# Patient Record
Sex: Female | Born: 1950 | Race: White | Hispanic: No | Smoking: Never smoker
Health system: Southern US, Community
[De-identification: ages and names within clinical notes are randomized; demographics above are authoritative.]

## PROBLEM LIST (undated history)

## (undated) DIAGNOSIS — T8859XA Other complications of anesthesia, initial encounter: Secondary | ICD-10-CM

## (undated) DIAGNOSIS — Z9889 Other specified postprocedural states: Secondary | ICD-10-CM

## (undated) DIAGNOSIS — E119 Type 2 diabetes mellitus without complications: Secondary | ICD-10-CM

## (undated) DIAGNOSIS — T4145XA Adverse effect of unspecified anesthetic, initial encounter: Secondary | ICD-10-CM

## (undated) DIAGNOSIS — R112 Nausea with vomiting, unspecified: Secondary | ICD-10-CM

## (undated) DIAGNOSIS — R569 Unspecified convulsions: Secondary | ICD-10-CM

## (undated) DIAGNOSIS — I10 Essential (primary) hypertension: Secondary | ICD-10-CM

## (undated) DIAGNOSIS — M1990 Unspecified osteoarthritis, unspecified site: Secondary | ICD-10-CM

## (undated) DIAGNOSIS — IMO0001 Reserved for inherently not codable concepts without codable children: Secondary | ICD-10-CM

## (undated) DIAGNOSIS — R011 Cardiac murmur, unspecified: Secondary | ICD-10-CM

## (undated) HISTORY — PX: ABDOMINAL HYSTERECTOMY: SHX81

## (undated) HISTORY — PX: COLONOSCOPY: SHX174

## (undated) HISTORY — PX: TONSILLECTOMY: SUR1361

---

## 1993-07-06 HISTORY — PX: CHOLECYSTECTOMY: SHX55

## 1997-12-26 ENCOUNTER — Other Ambulatory Visit: Admission: RE | Admit: 1997-12-26 | Discharge: 1997-12-26 | Payer: Self-pay | Admitting: Orthopedic Surgery

## 1997-12-28 ENCOUNTER — Emergency Department (HOSPITAL_COMMUNITY): Admission: EM | Admit: 1997-12-28 | Discharge: 1997-12-28 | Payer: Self-pay | Admitting: Emergency Medicine

## 1998-12-21 ENCOUNTER — Emergency Department (HOSPITAL_COMMUNITY): Admission: EM | Admit: 1998-12-21 | Discharge: 1998-12-21 | Payer: Self-pay | Admitting: *Deleted

## 1998-12-21 ENCOUNTER — Encounter: Payer: Self-pay | Admitting: Emergency Medicine

## 1999-01-13 ENCOUNTER — Encounter (INDEPENDENT_AMBULATORY_CARE_PROVIDER_SITE_OTHER): Payer: Self-pay | Admitting: Specialist

## 1999-01-13 ENCOUNTER — Inpatient Hospital Stay (HOSPITAL_COMMUNITY): Admission: RE | Admit: 1999-01-13 | Discharge: 1999-01-15 | Payer: Self-pay | Admitting: Obstetrics and Gynecology

## 1999-02-12 ENCOUNTER — Encounter: Admission: RE | Admit: 1999-02-12 | Discharge: 1999-02-12 | Payer: Self-pay | Admitting: Hematology and Oncology

## 1999-02-25 ENCOUNTER — Encounter: Admission: RE | Admit: 1999-02-25 | Discharge: 1999-02-25 | Payer: Self-pay | Admitting: Internal Medicine

## 1999-10-31 ENCOUNTER — Encounter: Admission: RE | Admit: 1999-10-31 | Discharge: 1999-10-31 | Payer: Self-pay | Admitting: Internal Medicine

## 2000-09-02 ENCOUNTER — Encounter: Admission: RE | Admit: 2000-09-02 | Discharge: 2000-09-02 | Payer: Self-pay | Admitting: Internal Medicine

## 2001-05-19 ENCOUNTER — Emergency Department (HOSPITAL_COMMUNITY): Admission: EM | Admit: 2001-05-19 | Discharge: 2001-05-19 | Payer: Self-pay | Admitting: *Deleted

## 2001-05-25 ENCOUNTER — Encounter: Admission: RE | Admit: 2001-05-25 | Discharge: 2001-05-25 | Payer: Self-pay | Admitting: Internal Medicine

## 2001-09-26 ENCOUNTER — Emergency Department (HOSPITAL_COMMUNITY): Admission: EM | Admit: 2001-09-26 | Discharge: 2001-09-26 | Payer: Self-pay | Admitting: Emergency Medicine

## 2001-09-26 ENCOUNTER — Encounter: Payer: Self-pay | Admitting: Emergency Medicine

## 2001-11-16 ENCOUNTER — Encounter: Admission: RE | Admit: 2001-11-16 | Discharge: 2001-11-16 | Payer: Self-pay | Admitting: Internal Medicine

## 2001-12-29 ENCOUNTER — Encounter: Admission: RE | Admit: 2001-12-29 | Discharge: 2001-12-29 | Payer: Self-pay | Admitting: Internal Medicine

## 2002-01-02 ENCOUNTER — Encounter: Admission: RE | Admit: 2002-01-02 | Discharge: 2002-01-02 | Payer: Self-pay | Admitting: Internal Medicine

## 2002-01-09 ENCOUNTER — Ambulatory Visit (HOSPITAL_COMMUNITY): Admission: RE | Admit: 2002-01-09 | Discharge: 2002-01-09 | Payer: Self-pay | Admitting: Otolaryngology

## 2002-01-09 ENCOUNTER — Encounter: Payer: Self-pay | Admitting: Otolaryngology

## 2002-01-09 ENCOUNTER — Emergency Department (HOSPITAL_COMMUNITY): Admission: EM | Admit: 2002-01-09 | Discharge: 2002-01-09 | Payer: Self-pay | Admitting: Emergency Medicine

## 2002-04-04 ENCOUNTER — Ambulatory Visit (HOSPITAL_COMMUNITY): Admission: RE | Admit: 2002-04-04 | Discharge: 2002-04-04 | Payer: Self-pay | Admitting: Gastroenterology

## 2002-04-04 ENCOUNTER — Encounter: Payer: Self-pay | Admitting: Gastroenterology

## 2002-08-17 ENCOUNTER — Emergency Department (HOSPITAL_COMMUNITY): Admission: EM | Admit: 2002-08-17 | Discharge: 2002-08-17 | Payer: Self-pay | Admitting: Emergency Medicine

## 2003-02-21 ENCOUNTER — Encounter: Admission: RE | Admit: 2003-02-21 | Discharge: 2003-02-21 | Payer: Self-pay | Admitting: Internal Medicine

## 2003-05-09 ENCOUNTER — Encounter: Admission: RE | Admit: 2003-05-09 | Discharge: 2003-05-09 | Payer: Self-pay | Admitting: Internal Medicine

## 2003-09-20 ENCOUNTER — Emergency Department (HOSPITAL_COMMUNITY): Admission: AD | Admit: 2003-09-20 | Discharge: 2003-09-20 | Payer: Self-pay | Admitting: Family Medicine

## 2003-10-17 ENCOUNTER — Emergency Department (HOSPITAL_COMMUNITY): Admission: EM | Admit: 2003-10-17 | Discharge: 2003-10-17 | Payer: Self-pay | Admitting: Family Medicine

## 2003-12-20 ENCOUNTER — Encounter: Admission: RE | Admit: 2003-12-20 | Discharge: 2003-12-20 | Payer: Self-pay | Admitting: Internal Medicine

## 2003-12-20 ENCOUNTER — Ambulatory Visit (HOSPITAL_COMMUNITY): Admission: RE | Admit: 2003-12-20 | Discharge: 2003-12-20 | Payer: Self-pay | Admitting: Internal Medicine

## 2004-01-17 ENCOUNTER — Encounter: Admission: RE | Admit: 2004-01-17 | Discharge: 2004-01-17 | Payer: Self-pay | Admitting: Internal Medicine

## 2004-01-23 ENCOUNTER — Ambulatory Visit (HOSPITAL_COMMUNITY): Admission: RE | Admit: 2004-01-23 | Discharge: 2004-01-23 | Payer: Self-pay | Admitting: Gastroenterology

## 2004-01-29 ENCOUNTER — Ambulatory Visit (HOSPITAL_COMMUNITY): Admission: RE | Admit: 2004-01-29 | Discharge: 2004-01-29 | Payer: Self-pay | Admitting: Internal Medicine

## 2004-01-29 ENCOUNTER — Encounter: Admission: RE | Admit: 2004-01-29 | Discharge: 2004-01-29 | Payer: Self-pay | Admitting: Internal Medicine

## 2004-02-19 ENCOUNTER — Encounter: Admission: RE | Admit: 2004-02-19 | Discharge: 2004-02-19 | Payer: Self-pay | Admitting: Internal Medicine

## 2004-10-02 ENCOUNTER — Emergency Department (HOSPITAL_COMMUNITY): Admission: EM | Admit: 2004-10-02 | Discharge: 2004-10-02 | Payer: Self-pay | Admitting: Emergency Medicine

## 2005-01-12 ENCOUNTER — Emergency Department (HOSPITAL_COMMUNITY): Admission: EM | Admit: 2005-01-12 | Discharge: 2005-01-12 | Payer: Self-pay | Admitting: Family Medicine

## 2006-06-05 ENCOUNTER — Emergency Department (HOSPITAL_COMMUNITY): Admission: EM | Admit: 2006-06-05 | Discharge: 2006-06-05 | Payer: Self-pay | Admitting: Family Medicine

## 2006-07-23 ENCOUNTER — Ambulatory Visit: Payer: Self-pay | Admitting: Internal Medicine

## 2006-07-26 ENCOUNTER — Ambulatory Visit: Payer: Self-pay | Admitting: Internal Medicine

## 2006-07-26 LAB — CONVERTED CEMR LAB
ALT: 33 units/L (ref 0–40)
Basophils Absolute: 0.1 10*3/uL (ref 0.0–0.1)
Bilirubin Urine: NEGATIVE
CO2: 27 meq/L (ref 19–32)
Calcium: 9.2 mg/dL (ref 8.4–10.5)
Creatinine, Ser: 0.9 mg/dL (ref 0.4–1.2)
Glucose, Bld: 147 mg/dL — ABNORMAL HIGH (ref 70–99)
HCT: 42.3 % (ref 36.0–46.0)
Hemoglobin, Urine: NEGATIVE
Hemoglobin: 14.8 g/dL (ref 12.0–15.0)
Hgb A1c MFr Bld: 6.7 % — ABNORMAL HIGH (ref 4.6–6.0)
MCV: 86.1 fL (ref 78.0–100.0)
Mucus, UA: NEGATIVE
Neutro Abs: 4.1 10*3/uL (ref 1.4–7.7)
Platelets: 284 10*3/uL (ref 150–400)
RBC: 4.91 M/uL (ref 3.87–5.11)
RDW: 12.4 % (ref 11.5–14.6)
Sodium: 138 meq/L (ref 135–145)
Specific Gravity, Urine: 1.02 (ref 1.000–1.03)
Triglycerides: 154 mg/dL — ABNORMAL HIGH (ref 0–149)
Urine Glucose: NEGATIVE mg/dL
Urobilinogen, UA: 0.2 (ref 0.0–1.0)
VLDL: 31 mg/dL (ref 0–40)
WBC: 8.3 10*3/uL (ref 4.5–10.5)

## 2006-07-28 ENCOUNTER — Ambulatory Visit: Payer: Self-pay | Admitting: Internal Medicine

## 2007-04-17 ENCOUNTER — Emergency Department (HOSPITAL_COMMUNITY): Admission: EM | Admit: 2007-04-17 | Discharge: 2007-04-17 | Payer: Self-pay | Admitting: Family Medicine

## 2008-01-02 ENCOUNTER — Ambulatory Visit: Payer: Self-pay | Admitting: Internal Medicine

## 2008-01-02 DIAGNOSIS — E1165 Type 2 diabetes mellitus with hyperglycemia: Secondary | ICD-10-CM | POA: Insufficient documentation

## 2008-01-02 DIAGNOSIS — E785 Hyperlipidemia, unspecified: Secondary | ICD-10-CM

## 2008-01-02 DIAGNOSIS — I10 Essential (primary) hypertension: Secondary | ICD-10-CM

## 2008-01-02 DIAGNOSIS — Z794 Long term (current) use of insulin: Secondary | ICD-10-CM

## 2008-01-02 LAB — CONVERTED CEMR LAB
Chloride: 102 meq/L (ref 96–112)
GFR calc non Af Amer: 93 mL/min
Glucose, Bld: 203 mg/dL — ABNORMAL HIGH (ref 70–99)
Hgb A1c MFr Bld: 7.1 % — ABNORMAL HIGH (ref 4.6–6.0)

## 2008-01-03 ENCOUNTER — Encounter: Payer: Self-pay | Admitting: Internal Medicine

## 2008-01-05 ENCOUNTER — Encounter: Payer: Self-pay | Admitting: Internal Medicine

## 2008-01-09 ENCOUNTER — Encounter: Payer: Self-pay | Admitting: Internal Medicine

## 2008-03-05 ENCOUNTER — Ambulatory Visit: Payer: Self-pay | Admitting: Internal Medicine

## 2008-03-05 DIAGNOSIS — R5381 Other malaise: Secondary | ICD-10-CM

## 2008-03-05 DIAGNOSIS — R5383 Other fatigue: Secondary | ICD-10-CM

## 2008-03-05 DIAGNOSIS — F411 Generalized anxiety disorder: Secondary | ICD-10-CM | POA: Insufficient documentation

## 2008-03-16 LAB — CONVERTED CEMR LAB
ALT: 34 U/L (ref 0–35)
AST: 25 U/L (ref 0–37)
Albumin: 4 g/dL (ref 3.5–5.2)
Alkaline Phosphatase: 60 U/L (ref 39–117)
BUN: 12 mg/dL (ref 6–23)
Basophils Absolute: 0 K/uL (ref 0.0–0.1)
Basophils Relative: 0.2 % (ref 0.0–3.0)
Bilirubin, Direct: 0.2 mg/dL (ref 0.0–0.3)
CO2: 28 meq/L (ref 19–32)
Calcium: 9.3 mg/dL (ref 8.4–10.5)
Chloride: 109 meq/L (ref 96–112)
Cholesterol: 181 mg/dL (ref 0–200)
Creatinine, Ser: 0.8 mg/dL (ref 0.4–1.2)
Creatinine,U: 195.5 mg/dL
Eosinophils Absolute: 0.4 K/uL (ref 0.0–0.7)
Eosinophils Relative: 3.7 % (ref 0.0–5.0)
GFR calc Af Amer: 96 mL/min
GFR calc non Af Amer: 79 mL/min
Glucose, Bld: 173 mg/dL — ABNORMAL HIGH (ref 70–99)
HCT: 44.2 % (ref 36.0–46.0)
HDL: 37.4 mg/dL — ABNORMAL LOW (ref >39.0)
Hemoglobin: 14.9 g/dL (ref 12.0–15.0)
Hgb A1c MFr Bld: 6.9 % — ABNORMAL HIGH (ref 4.6–6.0)
LDL Cholesterol: 108 mg/dL — ABNORMAL HIGH (ref 0–99)
Lymphocytes Relative: 25.8 % (ref 12.0–46.0)
MCHC: 33.7 g/dL (ref 30.0–36.0)
MCV: 89 fL (ref 78.0–100.0)
Microalb Creat Ratio: 21 mg/g (ref 0.0–30.0)
Microalb, Ur: 4.1 mg/dL — ABNORMAL HIGH (ref 0.0–1.9)
Monocytes Absolute: 0.5 K/uL (ref 0.1–1.0)
Monocytes Relative: 5.6 % (ref 3.0–12.0)
Neutro Abs: 6.1 K/uL (ref 1.4–7.7)
Neutrophils Relative %: 64.7 % (ref 43.0–77.0)
Platelets: 243 K/uL (ref 150–400)
Potassium: 3.9 meq/L (ref 3.5–5.1)
RBC: 4.96 M/uL (ref 3.87–5.11)
RDW: 12.7 % (ref 11.5–14.6)
Sodium: 142 meq/L (ref 135–145)
TSH: 3.32 u[IU]/mL (ref 0.35–5.50)
Total Bilirubin: 0.9 mg/dL (ref 0.3–1.2)
Total CHOL/HDL Ratio: 4.8
Total Protein: 7.4 g/dL (ref 6.0–8.3)
Triglycerides: 179 mg/dL — ABNORMAL HIGH (ref 0–149)
VLDL: 36 mg/dL (ref 0–40)
WBC: 9.5 10*3/microliter (ref 4.5–10.5)

## 2008-03-19 ENCOUNTER — Telehealth: Payer: Self-pay | Admitting: Internal Medicine

## 2008-04-03 ENCOUNTER — Telehealth (INDEPENDENT_AMBULATORY_CARE_PROVIDER_SITE_OTHER): Payer: Self-pay | Admitting: *Deleted

## 2008-05-27 ENCOUNTER — Emergency Department (HOSPITAL_COMMUNITY): Admission: EM | Admit: 2008-05-27 | Discharge: 2008-05-27 | Payer: Self-pay | Admitting: Family Medicine

## 2008-08-09 ENCOUNTER — Ambulatory Visit: Payer: Self-pay | Admitting: Internal Medicine

## 2008-08-13 ENCOUNTER — Telehealth (INDEPENDENT_AMBULATORY_CARE_PROVIDER_SITE_OTHER): Payer: Self-pay | Admitting: *Deleted

## 2008-08-13 LAB — CONVERTED CEMR LAB
CO2: 0 meq/L — CL (ref 19–32)
Calcium: 9.6 mg/dL (ref 8.4–10.5)
Cholesterol: 200 mg/dL (ref 0–200)
GFR calc Af Amer: 112 mL/min
HDL: 46 mg/dL (ref >39.0)
Hgb A1c MFr Bld: 7 % — ABNORMAL HIGH (ref 4.6–6.0)
LDL Cholesterol: 126 mg/dL — ABNORMAL HIGH (ref 0–99)
Sodium: 140 meq/L (ref 135–145)
Total CHOL/HDL Ratio: 4.3
VLDL: 28 mg/dL (ref 0–40)

## 2009-07-06 HISTORY — PX: TOTAL HIP ARTHROPLASTY: SHX124

## 2009-11-21 ENCOUNTER — Encounter: Admission: RE | Admit: 2009-11-21 | Discharge: 2009-11-21 | Payer: Self-pay | Admitting: Orthopedic Surgery

## 2009-11-22 ENCOUNTER — Inpatient Hospital Stay (HOSPITAL_COMMUNITY): Admission: RE | Admit: 2009-11-22 | Discharge: 2009-11-26 | Payer: Self-pay | Admitting: Orthopedic Surgery

## 2009-12-25 ENCOUNTER — Ambulatory Visit: Payer: Self-pay | Admitting: Vascular Surgery

## 2009-12-25 ENCOUNTER — Inpatient Hospital Stay (HOSPITAL_COMMUNITY): Admission: EM | Admit: 2009-12-25 | Discharge: 2010-01-02 | Payer: Self-pay | Admitting: Emergency Medicine

## 2010-07-27 ENCOUNTER — Encounter: Payer: Self-pay | Admitting: *Deleted

## 2010-09-21 LAB — DIFFERENTIAL
Basophils Absolute: 0 10*3/uL (ref 0.0–0.1)
Basophils Relative: 0 % (ref 0–1)
Eosinophils Absolute: 0.2 10*3/uL (ref 0.0–0.7)
Eosinophils Relative: 2 % (ref 0–5)
Lymphocytes Relative: 26 % (ref 12–46)
Lymphs Abs: 2.5 10*3/uL (ref 0.7–4.0)
Monocytes Absolute: 0.7 10*3/uL (ref 0.1–1.0)
Monocytes Relative: 7 % (ref 3–12)
Neutro Abs: 6.1 10*3/uL (ref 1.7–7.7)
Neutrophils Relative %: 64 % (ref 43–77)

## 2010-09-21 LAB — CBC
Hemoglobin: 10.5 g/dL — ABNORMAL LOW (ref 12.0–15.0)
Hemoglobin: 11.7 g/dL — ABNORMAL LOW (ref 12.0–15.0)
MCH: 28 pg (ref 26.0–34.0)
MCH: 28.5 pg (ref 26.0–34.0)
MCHC: 32.5 g/dL (ref 30.0–36.0)
MCHC: 33.3 g/dL (ref 30.0–36.0)
MCV: 85.6 fL (ref 78.0–100.0)
MCV: 86 fL (ref 78.0–100.0)
Platelets: 435 10*3/uL — ABNORMAL HIGH (ref 150–400)
RDW: 15 % (ref 11.5–15.5)

## 2010-09-21 LAB — SEDIMENTATION RATE
Sed Rate: 102 mm/hr — ABNORMAL HIGH (ref 0–22)
Sed Rate: 120 mm/hr — ABNORMAL HIGH (ref 0–22)

## 2010-09-21 LAB — POCT I-STAT, CHEM 8
HCT: 36 % (ref 36.0–46.0)
Sodium: 139 mEq/L (ref 135–145)
TCO2: 28 mmol/L (ref 0–100)

## 2010-09-21 LAB — CULTURE, BLOOD (ROUTINE X 2)

## 2010-09-21 LAB — URINALYSIS, ROUTINE W REFLEX MICROSCOPIC: Hgb urine dipstick: NEGATIVE

## 2010-09-21 LAB — C-REACTIVE PROTEIN: CRP: 2.3 mg/dL — ABNORMAL HIGH (ref <0.6)

## 2010-09-22 LAB — CBC
HCT: 35 % — ABNORMAL LOW (ref 36.0–46.0)
HCT: 45.3 % (ref 36.0–46.0)
Hemoglobin: 12.2 g/dL (ref 12.0–15.0)
Hemoglobin: 15.2 g/dL — ABNORMAL HIGH (ref 12.0–15.0)
Hemoglobin: 9.1 g/dL — ABNORMAL LOW (ref 12.0–15.0)
MCHC: 34.3 g/dL (ref 30.0–36.0)
MCHC: 34.4 g/dL (ref 30.0–36.0)
MCHC: 34.8 g/dL (ref 30.0–36.0)
MCV: 89.6 fL (ref 78.0–100.0)
MCV: 90 fL (ref 78.0–100.0)
Platelets: 224 10*3/uL (ref 150–400)
RBC: 2.94 MIL/uL — ABNORMAL LOW (ref 3.87–5.11)
RBC: 3.5 MIL/uL — ABNORMAL LOW (ref 3.87–5.11)
RBC: 3.9 MIL/uL (ref 3.87–5.11)
RBC: 5.04 MIL/uL (ref 3.87–5.11)
RDW: 13.4 % (ref 11.5–15.5)
RDW: 13.7 % (ref 11.5–15.5)
WBC: 14.1 10*3/uL — ABNORMAL HIGH (ref 4.0–10.5)

## 2010-09-22 LAB — BASIC METABOLIC PANEL
CO2: 24 mEq/L (ref 19–32)
CO2: 27 mEq/L (ref 19–32)
CO2: 30 mEq/L (ref 19–32)
Calcium: 8.7 mg/dL (ref 8.4–10.5)
Chloride: 102 mEq/L (ref 96–112)
Chloride: 99 mEq/L (ref 96–112)
Creatinine, Ser: 0.66 mg/dL (ref 0.4–1.2)
Creatinine, Ser: 0.71 mg/dL (ref 0.4–1.2)
GFR calc Af Amer: >60 mL/min (ref >60)
GFR calc Af Amer: >60 mL/min (ref >60)
GFR calc Af Amer: >60 mL/min (ref >60)
GFR calc non Af Amer: >60 mL/min (ref >60)
Glucose, Bld: 200 mg/dL — ABNORMAL HIGH (ref 70–99)
Potassium: 4.1 mEq/L (ref 3.5–5.1)
Sodium: 135 mEq/L (ref 135–145)
Sodium: 136 mEq/L (ref 135–145)

## 2010-09-22 LAB — URINALYSIS, ROUTINE W REFLEX MICROSCOPIC
Glucose, UA: NEGATIVE mg/dL
Hgb urine dipstick: NEGATIVE
pH: 5 (ref 5.0–8.0)

## 2010-09-22 LAB — PROTIME-INR
INR: 1.19 (ref 0.00–1.49)
Prothrombin Time: 12.4 seconds (ref 11.6–15.2)
Prothrombin Time: 13.4 seconds (ref 11.6–15.2)
Prothrombin Time: 15 seconds (ref 11.6–15.2)

## 2010-09-22 LAB — COMPREHENSIVE METABOLIC PANEL
AST: 29 U/L (ref 0–37)
Albumin: 4.5 g/dL (ref 3.5–5.2)
BUN: 16 mg/dL (ref 6–23)
Chloride: 103 mEq/L (ref 96–112)
GFR calc non Af Amer: >60 mL/min (ref >60)
Glucose, Bld: 145 mg/dL — ABNORMAL HIGH (ref 70–99)
Potassium: 4.3 mEq/L (ref 3.5–5.1)
Sodium: 137 mEq/L (ref 135–145)
Total Protein: 7.4 g/dL (ref 6.0–8.3)

## 2010-09-22 LAB — URINALYSIS, MICROSCOPIC ONLY
Glucose, UA: 100 mg/dL — AB
Specific Gravity, Urine: 1.008 (ref 1.005–1.030)
Urobilinogen, UA: 0.2 mg/dL (ref 0.0–1.0)
pH: 8 (ref 5.0–8.0)

## 2010-09-22 LAB — HEMOGLOBIN A1C: Mean Plasma Glucose: 151 mg/dL — ABNORMAL HIGH (ref <117)

## 2010-09-22 LAB — GLUCOSE, CAPILLARY
Glucose-Capillary: 141 mg/dL — ABNORMAL HIGH (ref 70–99)
Glucose-Capillary: 144 mg/dL — ABNORMAL HIGH (ref 70–99)
Glucose-Capillary: 159 mg/dL — ABNORMAL HIGH (ref 70–99)
Glucose-Capillary: 195 mg/dL — ABNORMAL HIGH (ref 70–99)
Glucose-Capillary: 202 mg/dL — ABNORMAL HIGH (ref 70–99)

## 2010-11-21 NOTE — Assessment & Plan Note (Signed)
[facility]                           PRIMARY CARE OFFICE NOTE   Kelly Richardson, Kelly Richardson                     MRN:          [id number]  DATE:07/23/2006                            DOB:          Apr 04, 1951    CHIEF COMPLAINT:  New patient to practice.   HISTORY OF PRESENT ILLNESS:  The patient is a 60 year old white female  here to establish primary care.  She was formerly followed by  [facility].  She has not had followup with a primary  care physician in several years.  She has seen Dr. Jarold Motto of [facility] back in 2003 for feeling of hoarseness and dysphagia.  She underwent  esophagram which was positive for hiatal hernia, no esophagitis, no  abnormal neuromuscular dysfunction.   According to Dr. Norval Gable notes, she does have a psychiatric history  in the past that may have contributed to her symptomatology.  She still  continues to have intermittent discomfort in her throat.  She attributes  her symptoms after undergoing intubation for hysterectomy.  She,  however, also has daily GERD symptoms, mainly at night.  She takes Tums  at least four times per week.   Review of our records also show that she has had abnormal blood sugars  in the past.  She denies any family history of type 2 diabetes, but she  has not had any recent screening tests for diabetes.   In terms of her social history, she has been disabled since the late  1980s secondary to a severe adverse reaction to a medication, Seldane-D.  She notes seizure activity as well as vomiting.  This all occurred while  she lived in [place], [place].   PAST MEDICAL HISTORY SUMMARY:  1. Dysphagia, unclear etiology.  2. Hiatal hernia.  3. Gastroesophageal reflux disease.  4. Status post cholecystectomy in 1995.  5. Status post hysterectomy in 2000.  6. Remote tonsillectomy.  7. Questionable history of arrhythmia.  8. Question history of seizures.   CURRENT  MEDICATIONS:  None.   ALLERGIES TO MEDICATIONS:  1. PENICILLIN.  2. SULFA.  3. SELDANE-D.   SOCIAL HISTORY:  She is divorced, currently living with a roommate.   FAMILY HISTORY:  Mother is alive at age 3, has COPD stage IV.  Father's  medical history is unknown.  She denies any family history of cancer or  type 2 diabetes.   HABITS:  She does not drink or smoke.  She states she had a remote  colonoscopy in the late 1980s and had a bad experience and refuses any  further colon screening.   REVIEW OF SYSTEMS:  No fevers or chills.  HEENT symptoms as noted above.  The patient denies any chest pain.  Some wheezing and some nocturnal  cough.  GI symptoms also noted above.  All other symptoms negative.   PHYSICAL EXAMINATION:  VITAL SIGNS:  Weight is 220 pounds, temperature  is 98.3, pulse is 92, BP is 146/89 in the left arm in a seated position.  GENERAL:  The patient is a pleasant but morbidly-obese 60 year old white  female in no  apparent distress.  She does appear somewhat anxious.  HEENT:  Normocephalic, atraumatic.  Pupils are equal and reactive to  light bilaterally.  Extraocular motility was intact.  The patient was  anicteric, conjunctivae were within normal limits.  External auditory  canals and tympanic membranes are clear bilaterally.  Hearing was  grossly normal.  Oropharyngeal exam was unremarkable.  NECK:  Was somewhat thick but no adenopathy, carotid bruit or  thyromegaly.  CHEST:  Normal inspiratory effort.  No rhonchi, rales or wheezing.  CARDIOVASCULAR:  Regular rate and rhythm.  No significant murmurs, rubs  or gallops appreciated.  ABDOMEN:  Protuberant but nontender.  No organomegaly appreciated.  MUSCULOSKELETAL:  No clubbing, cyanosis, or edema.  The patient had  intact pedis dorsalis pulses.   IMPRESSION/RECOMMENDATIONS:  1. Dysphagia, likely secondary to gastroesophageal reflux disease.  2. Morbid obesity.  3. Elevated blood pressure without diagnosis of  hypertension.  4. Health maintenance.   RECOMMENDATIONS:  The patient will be started on omeprazole 20 mg b.i.d.  She does have longstanding GERD symptoms and may benefit from EGD to  rule out Barrett's esophagus.   She states her blood pressure at home normally is in the 120s to 130s.  She was instructed to maintain a log of outpatient blood pressure  readings, at least 10 before our followup visit in 4-6 weeks.   I provided the contact information for screening mammogram.  She  adamantly refused any colon screening.  We discussed the risks and  benefits.  Followup time is 4-6 weeks.     Barbette Hair. Artist Pais, DO  Electronically Signed   RDY/MedQ  DD: 07/23/2006  DT: 07/23/2006  Job #: [id number]

## 2011-04-07 LAB — POCT URINALYSIS DIP (DEVICE)
Bilirubin Urine: NEGATIVE
Hgb urine dipstick: NEGATIVE
Nitrite: NEGATIVE
pH: 7 (ref 5.0–8.0)

## 2011-09-15 DIAGNOSIS — Z124 Encounter for screening for malignant neoplasm of cervix: Secondary | ICD-10-CM | POA: Diagnosis not present

## 2011-09-15 DIAGNOSIS — R1084 Generalized abdominal pain: Secondary | ICD-10-CM | POA: Diagnosis not present

## 2011-09-15 DIAGNOSIS — Z01419 Encounter for gynecological examination (general) (routine) without abnormal findings: Secondary | ICD-10-CM | POA: Diagnosis not present

## 2011-09-15 DIAGNOSIS — Z9189 Other specified personal risk factors, not elsewhere classified: Secondary | ICD-10-CM | POA: Diagnosis not present

## 2011-09-15 DIAGNOSIS — R32 Unspecified urinary incontinence: Secondary | ICD-10-CM | POA: Diagnosis not present

## 2011-09-15 DIAGNOSIS — R1031 Right lower quadrant pain: Secondary | ICD-10-CM | POA: Diagnosis not present

## 2012-02-05 IMAGING — CT CT CHEST W/O CM
2 of 3 series · 15 of 36 positions shown, 18 images · non-contrast
Comparison: Chest x-ray 11/20/2009

CLINICAL DATA: Congenital dysplasia of the right hip.  Chronic
cough.  Diabetes. Question of right lower lobe nodule.

CT CHEST WITHOUT CONTRAST
TECHNIQUE: Multidetector CT imaging of the chest was performed
following the standard protocol without IV contrast.

[Series 2: routine chest · axial · 0.70mm/px · z∈[-248,-18]mm · 12 of 54 slices shown, 15 images]
[im 4/54  mediastinal]
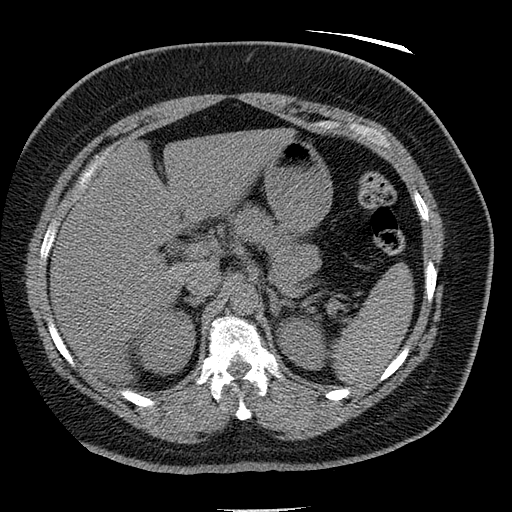
[im 4/54  lung]
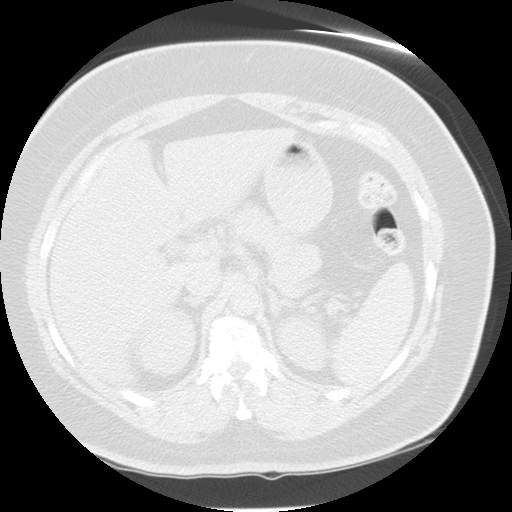
[im 8/54  lung]
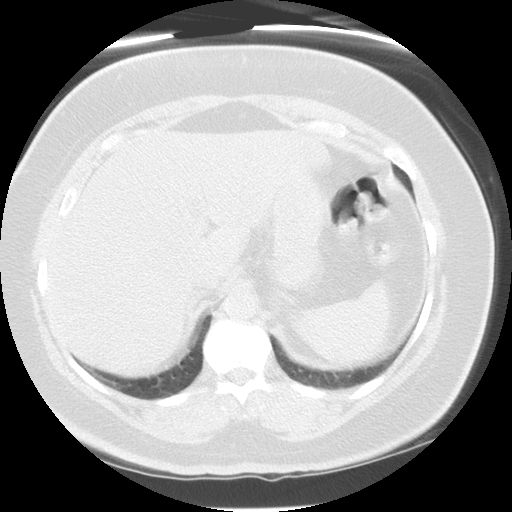
[im 12/54  lung]
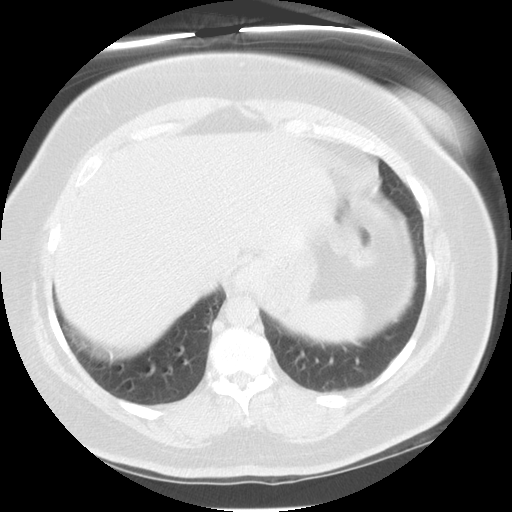
[im 16/54  lung]
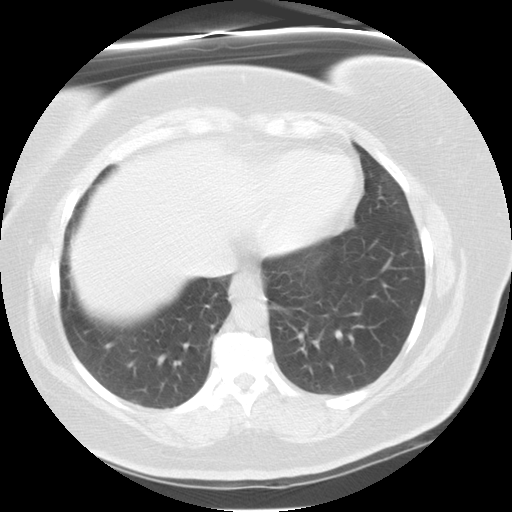
[im 20/54  mediastinal]
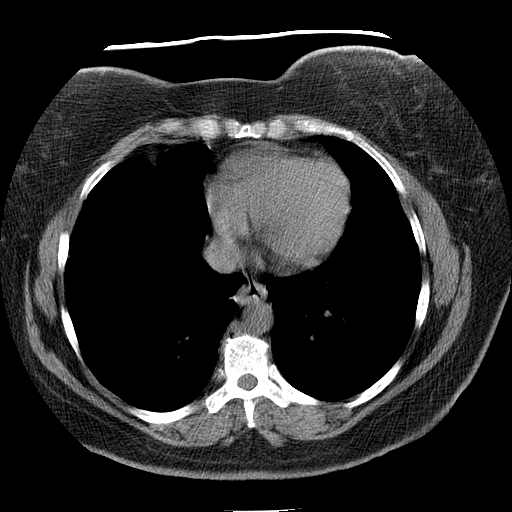
[im 20/54  lung]
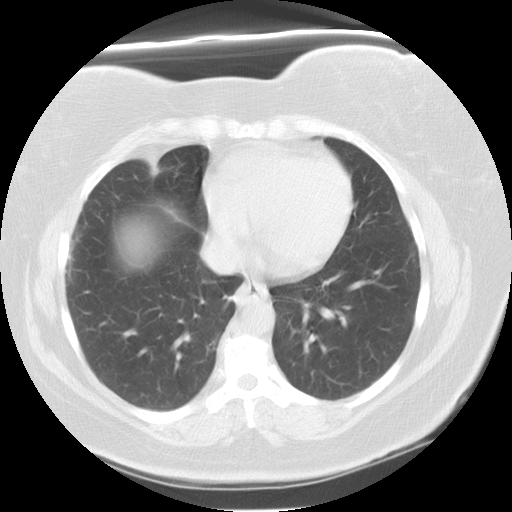
[im 24/54  lung]
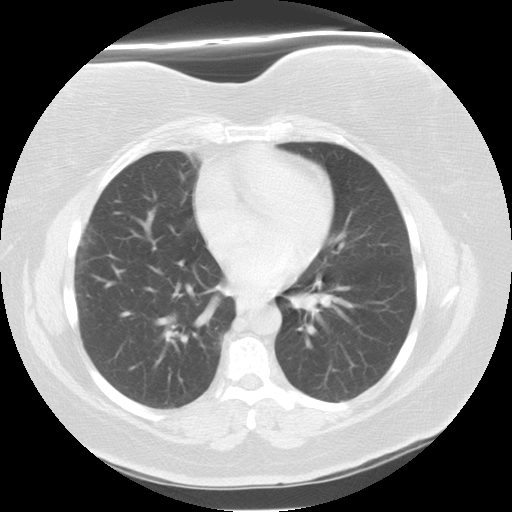
[im 30/54  lung]
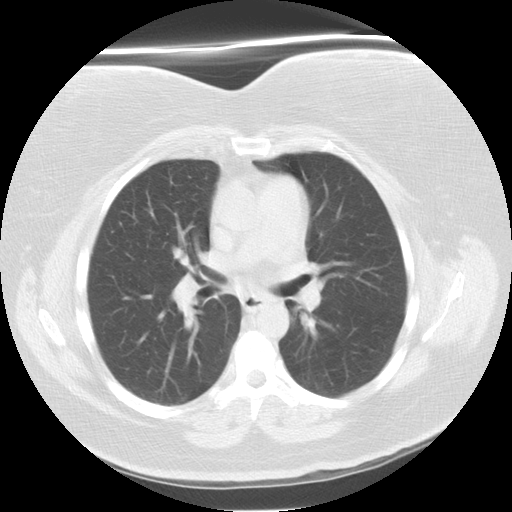
[im 34/54  lung]
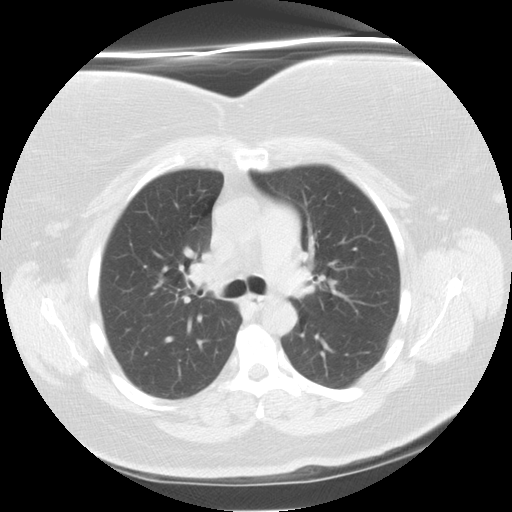
[im 38/54  mediastinal]
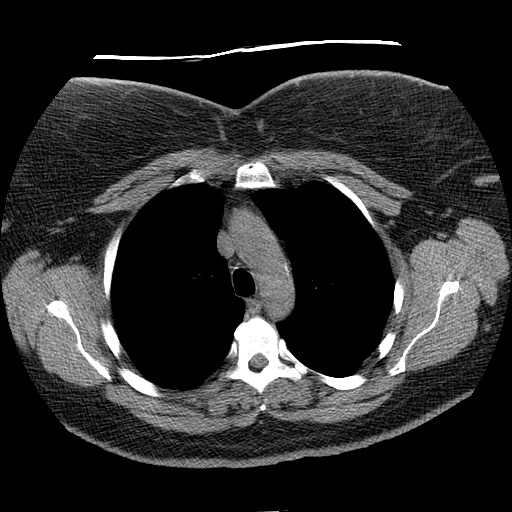
[im 38/54  lung]
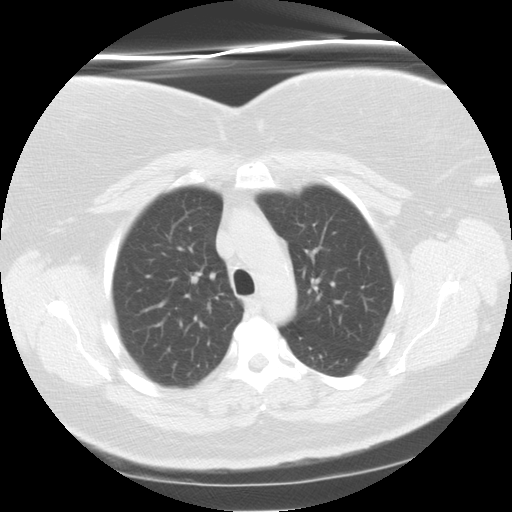
[im 42/54  lung]
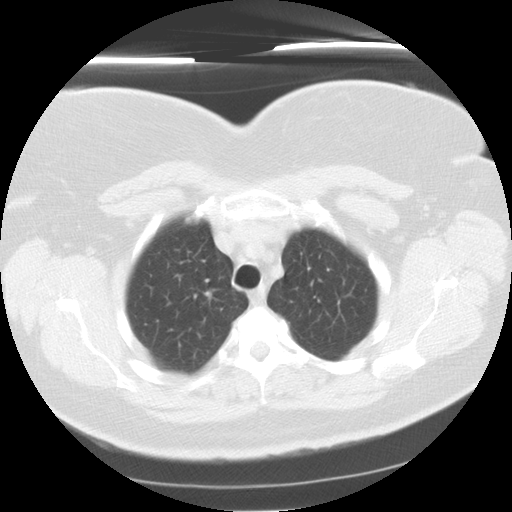
[im 46/54  lung]
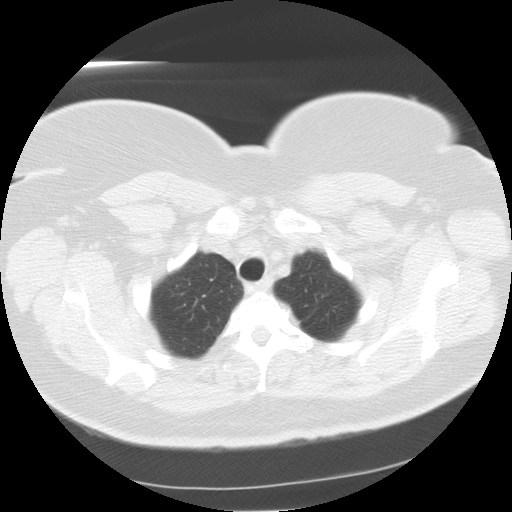
[im 50/54  lung]
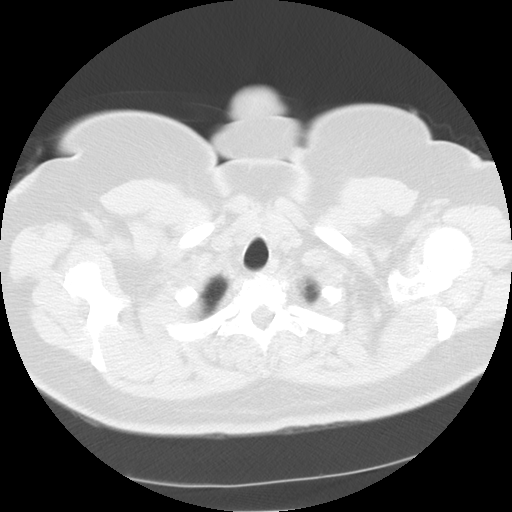

[Series 400: coronal · coronal · 0.70mm/px · 3 of 127 slices shown]
[im 26/127  lung]
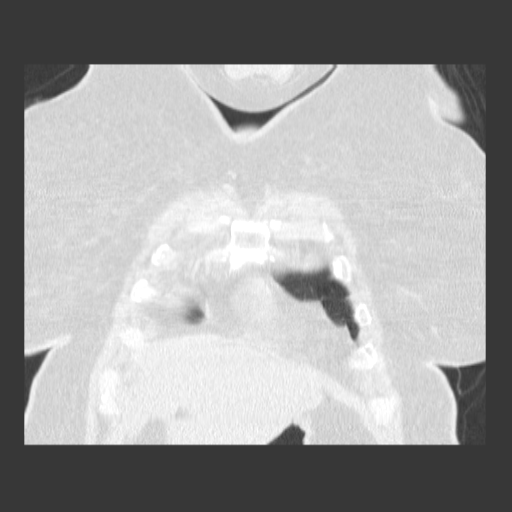
[im 51/127  lung]
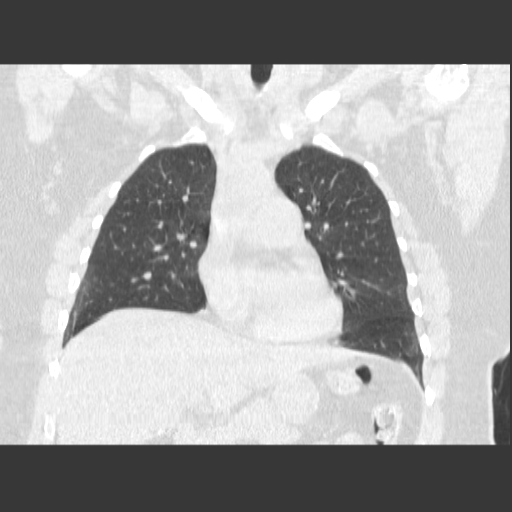
[im 76/127  lung]
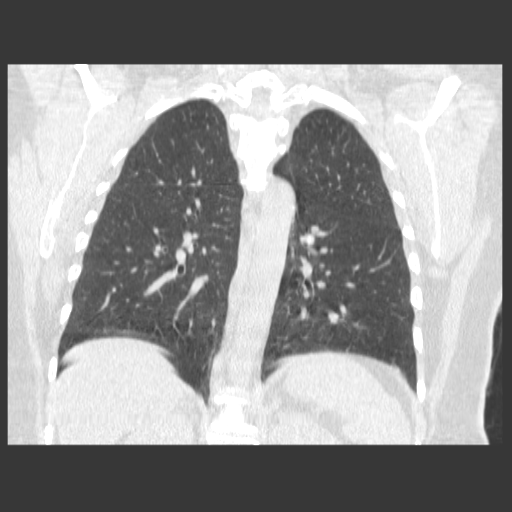

[15 of 36 positions shown; findings below may reference images not displayed]

FINDINGS: At the right lung base, there is a small amount of
pleural scarring, likely accounting for the abnormality seen on
chest x-ray.  No definite nodules, pleural effusion, or infiltrate
identified by CT.  The visualized portion of the thyroid has a
normal appearance.  No mediastinal, hilar, or axillary adenopathy.
The distal esophagus appears thick-walled, consistent with a hiatal
hernia.

Images of the upper abdomen show surgical clips in the region of
the gallbladder fossa.  There is probable small splenic artery
aneurysm, incompletely imaged.
IMPRESSION: 1.  Small amount of pleural scarring at the right lung base
accounting for the plain film abnormality.  No evidence for
pulmonary nodule.
2.  Status post cholecystectomy.
3.  Hiatal hernia.
4.  Small splenic artery aneurysm.

## 2012-02-06 IMAGING — CR DG HIP 1V PORT*L*
1 series · 1 of 1 positions shown · non-contrast
Comparison: None.

CLINICAL DATA: Status post right hip replacement for a congenital
dysplasia.

PORTABLE LEFT HIP - 1 VIEW

[AP]
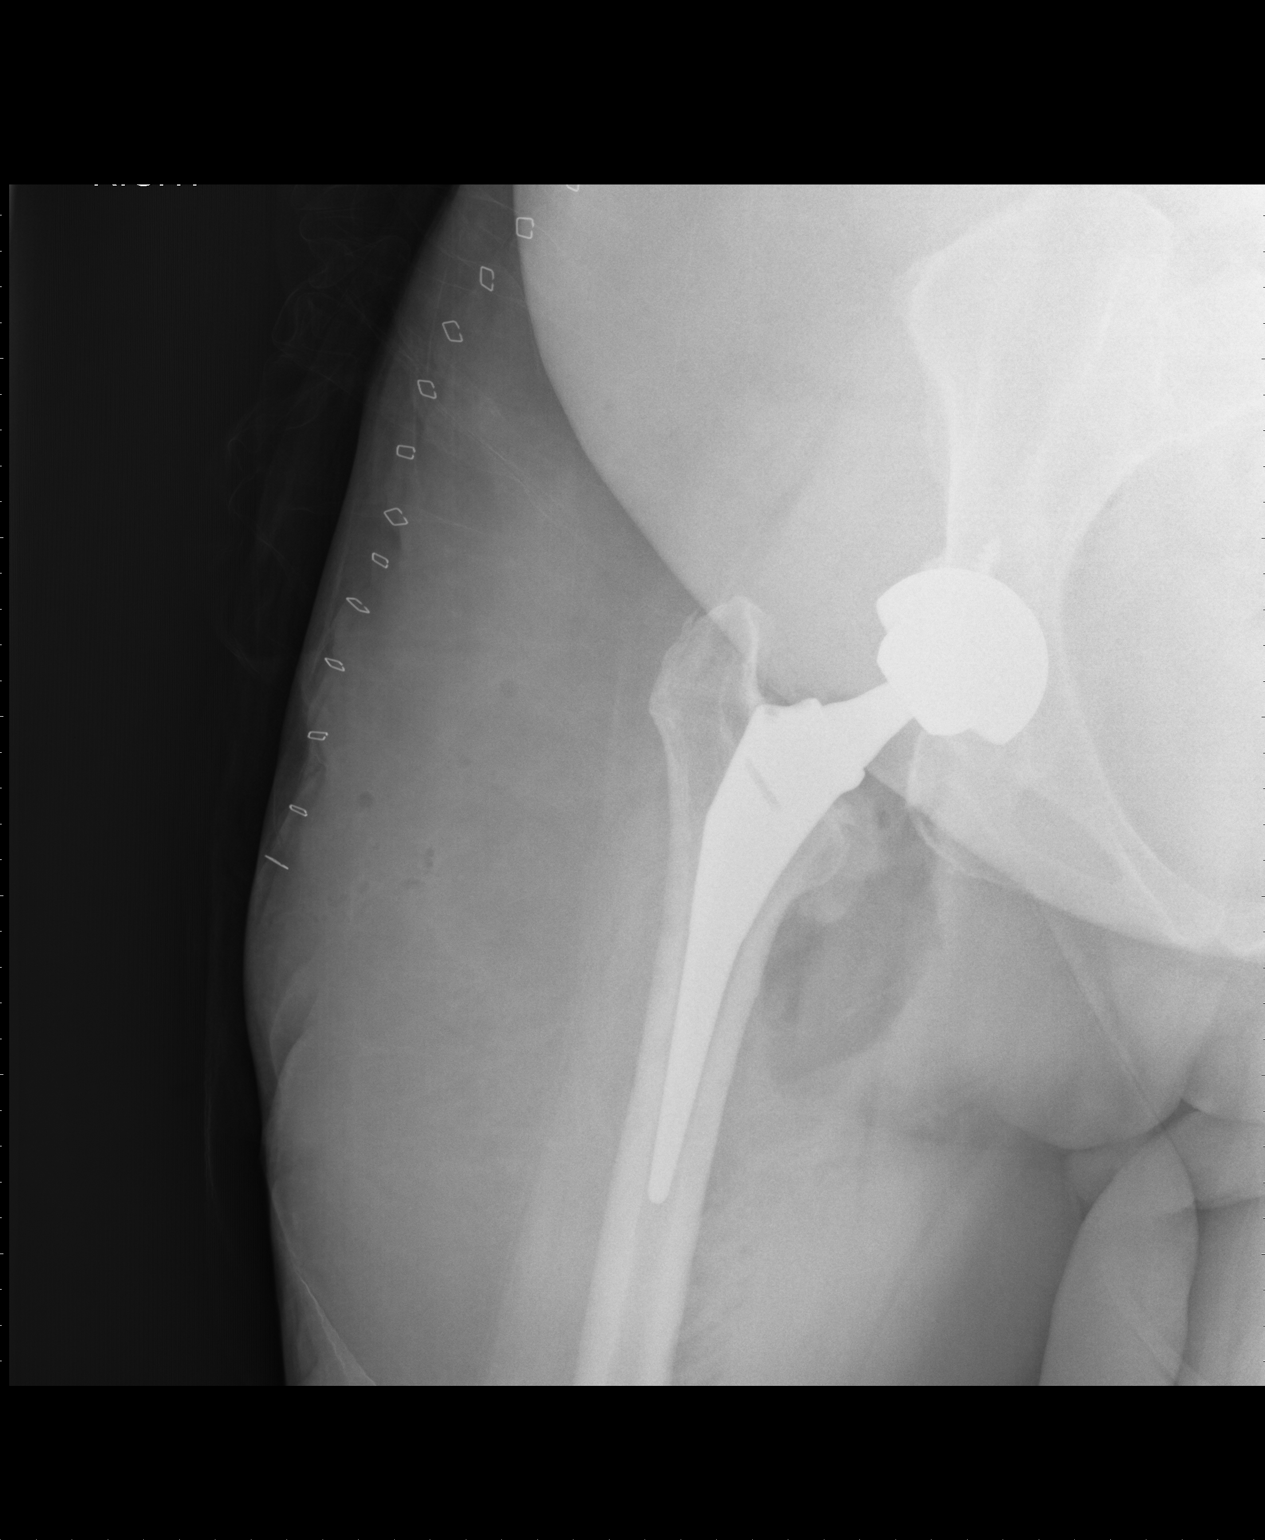

[1 of 1 positions shown; findings below may reference images not displayed]

FINDINGS: A single frontal portable view of the right hip was
requested and performed.  This demonstrates a right total hip
prosthesis in satisfactory position and alignment.  No fracture or
dislocation seen.
IMPRESSION: Satisfactory postoperative appearance of a right total hip
prosthesis in the frontal projection.

## 2012-04-20 ENCOUNTER — Emergency Department (HOSPITAL_COMMUNITY): Payer: Medicare Other

## 2012-04-20 ENCOUNTER — Emergency Department (HOSPITAL_COMMUNITY)
Admission: EM | Admit: 2012-04-20 | Discharge: 2012-04-21 | Disposition: A | Discharge status: Home / Self Care | Payer: Medicare Other | Attending: Emergency Medicine | Admitting: Emergency Medicine

## 2012-04-20 DIAGNOSIS — R7301 Impaired fasting glucose: Secondary | ICD-10-CM | POA: Diagnosis not present

## 2012-04-20 DIAGNOSIS — IMO0001 Reserved for inherently not codable concepts without codable children: Secondary | ICD-10-CM | POA: Insufficient documentation

## 2012-04-20 DIAGNOSIS — R5381 Other malaise: Secondary | ICD-10-CM | POA: Diagnosis not present

## 2012-04-20 DIAGNOSIS — R05 Cough: Secondary | ICD-10-CM | POA: Diagnosis not present

## 2012-04-20 DIAGNOSIS — R197 Diarrhea, unspecified: Secondary | ICD-10-CM | POA: Diagnosis not present

## 2012-04-20 DIAGNOSIS — J3489 Other specified disorders of nose and nasal sinuses: Secondary | ICD-10-CM | POA: Insufficient documentation

## 2012-04-20 DIAGNOSIS — R509 Fever, unspecified: Secondary | ICD-10-CM | POA: Insufficient documentation

## 2012-04-20 DIAGNOSIS — R070 Pain in throat: Secondary | ICD-10-CM | POA: Diagnosis not present

## 2012-04-20 DIAGNOSIS — R7309 Other abnormal glucose: Secondary | ICD-10-CM | POA: Diagnosis not present

## 2012-04-20 DIAGNOSIS — R059 Cough, unspecified: Secondary | ICD-10-CM | POA: Diagnosis not present

## 2012-04-20 DIAGNOSIS — R112 Nausea with vomiting, unspecified: Secondary | ICD-10-CM | POA: Diagnosis not present

## 2012-04-20 DIAGNOSIS — R0602 Shortness of breath: Secondary | ICD-10-CM | POA: Diagnosis not present

## 2012-04-20 DIAGNOSIS — B338 Other specified viral diseases: Secondary | ICD-10-CM | POA: Diagnosis not present

## 2012-04-20 DIAGNOSIS — R110 Nausea: Secondary | ICD-10-CM | POA: Insufficient documentation

## 2012-04-20 DIAGNOSIS — R739 Hyperglycemia, unspecified: Secondary | ICD-10-CM

## 2012-04-20 DIAGNOSIS — B349 Viral infection, unspecified: Secondary | ICD-10-CM

## 2012-04-20 LAB — POCT I-STAT, CHEM 8
Chloride: 103 mEq/L (ref 96–112)
HCT: 46 % (ref 36.0–46.0)
Potassium: 4 mEq/L (ref 3.5–5.1)
Sodium: 137 mEq/L (ref 135–145)

## 2012-04-20 LAB — URINALYSIS, ROUTINE W REFLEX MICROSCOPIC
Glucose, UA: >1000 mg/dL — AB
Hgb urine dipstick: NEGATIVE
Protein, ur: NEGATIVE mg/dL
Specific Gravity, Urine: 1.029 (ref 1.005–1.030)

## 2012-04-20 LAB — URINE MICROSCOPIC-ADD ON

## 2012-04-20 MED ORDER — SODIUM CHLORIDE 0.9 % IV BOLUS (SEPSIS): 1000.0000 mL | Freq: Once | INTRAVENOUS | Status: DC

## 2012-04-20 NOTE — ED Notes (Signed)
Pt has not had flu shot this season.

## 2012-04-20 NOTE — ED Notes (Signed)
Per [facility] pt has had n/v diarrhea cough and sore throat for 8 days. Appetite poor. CBG 324

## 2012-04-20 NOTE — ED Notes (Signed)
ZOX:[id number]<AV> Expected date:<BR> Expected time:<BR> Means of arrival:<BR> Comments:<BR> CBG &gt; 300-weakness

## 2012-04-20 NOTE — ED Provider Notes (Signed)
History     CSN: [id number]  Arrival date & time 04/20/12  2015   First MD Initiated Contact with Patient 04/20/12 2235      Chief Complaint  Patient presents with  . Fever    nausea vomiting diarrhea cough sore throat    (Consider location/radiation/quality/duration/timing/severity/associated sxs/prior treatment) HPI Comments: Patient comes in today with a chief complaint of subjective fever, productive cough, sore throat, decreased appetite, body aches, and fatigue for the past 8 days.  She has also had some nausea and post tussive vomiting.  She is unsure what her temperature has been because she does not have a thermometer.  She has not taken anything for her symptoms.  She did not yet have an influenza vaccine. She does not smoke.    No known sick contacts.  Patient is a 61 y.o. female presenting with URI. The history is provided by the patient.  URI The primary symptoms include fever, fatigue, sore throat, cough, nausea and myalgias. Primary symptoms do not include ear pain, abdominal pain or rash.  The sore throat is not accompanied by trouble swallowing or drooling.  Symptoms associated with the illness include chills, plugged ear sensation, congestion and rhinorrhea. The illness is not associated with sinus pressure.    No past medical history on file.  No past surgical history on file.  No family history on file.  History  Substance Use Topics  . Smoking status: Not on file  . Smokeless tobacco: Not on file  . Alcohol Use: Not on file    OB History    No data available      Review of Systems  Constitutional: Positive for fever, chills, appetite change and fatigue.  HENT: Positive for congestion, sore throat and rhinorrhea. Negative for ear pain, drooling, trouble swallowing, neck pain, neck stiffness, voice change and sinus pressure.   Respiratory: Positive for cough. Negative for shortness of breath.   Cardiovascular: Negative for chest pain.    Gastrointestinal: Positive for nausea and diarrhea. Negative for abdominal pain, constipation and blood in stool.       Post tussive vomiting  Genitourinary: Negative for dysuria, urgency and frequency.  Musculoskeletal: Positive for myalgias.  Skin: Negative for rash.  Neurological: Negative for dizziness, syncope and light-headedness.    Allergies  Iohexol; Metformin; Penicillins; and Sulfonamide derivatives  Home Medications   Current Outpatient Rx  Name Route Sig Dispense Refill  . DEXTROMETHORPHAN-GUAIFENESIN 10-100 MG/5ML PO LIQD Oral Take 5 mLs by mouth every 4 (four) hours as needed. Cough    . PHENOL 1.4 % MT LIQD Mouth/Throat Use as directed 1 spray in the mouth or throat as needed. Throat irritation      BP 151/83  Pulse 106  Temp 98.7 F (37.1 C) (Oral)  Resp 18  Wt 230 lb (104.327 kg)  SpO2 97%  Physical Exam  Nursing note and vitals reviewed. Constitutional: She appears well-developed and well-nourished. No distress.  HENT:  Head: Normocephalic and atraumatic.  Nose: Nose normal.  Mouth/Throat: Uvula is midline and mucous membranes are normal. Posterior oropharyngeal erythema present. No oropharyngeal exudate or posterior oropharyngeal edema.  Eyes: EOM are normal. Pupils are equal, round, and reactive to light.  Neck: Normal range of motion. Neck supple.  Cardiovascular: Normal rate, regular rhythm and normal heart sounds.   Pulmonary/Chest: Effort normal and breath sounds normal. No respiratory distress. She has no wheezes. She has no rales.  Abdominal: Soft. Bowel sounds are normal. She exhibits no distension and no  mass. There is no tenderness. There is no rebound and no guarding.  Musculoskeletal: Normal range of motion.  Lymphadenopathy:    She has no cervical adenopathy.  Neurological: She is alert.  Skin: Skin is warm and dry. No rash noted. She is not diaphoretic.  Psychiatric: She has a normal mood and affect.    ED Course  Procedures  (including critical care time)  Labs Reviewed  GLUCOSE, CAPILLARY - Abnormal; Notable for the following:    Glucose-Capillary 320 (*)     All other components within normal limits  URINALYSIS, ROUTINE W REFLEX MICROSCOPIC - Abnormal; Notable for the following:    APPearance CLOUDY (*)     Glucose, UA >1000 (*)     All other components within normal limits  URINE MICROSCOPIC-ADD ON - Abnormal; Notable for the following:    Squamous Epithelial / LPF FEW (*)     All other components within normal limits  RAPID STREP SCREEN   Dg Chest 2 View  04/20/2012  *RADIOLOGY REPORT*  Clinical Data: Short of breath with productive cough for 8 days. Nonsmoker.  CHEST - 2 VIEW  Comparison: 11/20/2009  Findings: Cholecystectomy clips.   Normal heart size and mediastinal contours. No pleural effusion or pneumothorax.  Mildly low lung volumes.  Mild right infrahilar volume loss which is similar. Clear lungs.  IMPRESSION: No acute cardiopulmonary disease.   Original Report Authenticated By: Consuello Bossier, M.D.      No diagnosis found.    MDM  Patient presenting with subjective fever, ST, cough, nausea, fatigue, congestion, decreased appetite, and body aches. UA negative for infection.  Glucose >1000 in urine.  Patient informed of this and educated on the importance of following up with a PCP for DM management.  No known history of DM.  CXR negative.   Patient non toxic appearing.  Suspect viral illness.  Patient discharged home.  Instructed to follow up with PCP.  Return precautions discussed.        Pascal Lux Sorrento, PA-C 04/21/12 [phone]

## 2012-04-25 NOTE — ED Provider Notes (Signed)
Medical screening examination/treatment/procedure(s) were performed by non-physician practitioner and as supervising physician I was immediately available for consultation/collaboration.  Katreena Schupp, MD 04/25/12 2156 

## 2012-09-08 ENCOUNTER — Emergency Department (HOSPITAL_COMMUNITY)
Admission: EM | Admit: 2012-09-08 | Discharge: 2012-09-08 | Disposition: A | Discharge status: Home / Self Care | Payer: Medicare Other | Attending: Emergency Medicine | Admitting: Emergency Medicine

## 2012-09-08 ENCOUNTER — Encounter (HOSPITAL_COMMUNITY): Payer: Self-pay | Admitting: Emergency Medicine

## 2012-09-08 DIAGNOSIS — Z7982 Long term (current) use of aspirin: Secondary | ICD-10-CM | POA: Diagnosis not present

## 2012-09-08 DIAGNOSIS — E1169 Type 2 diabetes mellitus with other specified complication: Secondary | ICD-10-CM | POA: Insufficient documentation

## 2012-09-08 DIAGNOSIS — B3731 Acute candidiasis of vulva and vagina: Secondary | ICD-10-CM | POA: Insufficient documentation

## 2012-09-08 DIAGNOSIS — B373 Candidiasis of vulva and vagina: Secondary | ICD-10-CM | POA: Diagnosis not present

## 2012-09-08 HISTORY — DX: Type 2 diabetes mellitus without complications: E11.9

## 2012-09-08 LAB — CBC WITH DIFFERENTIAL/PLATELET
HCT: 44 % (ref 36.0–46.0)
Hemoglobin: 14.5 g/dL (ref 12.0–15.0)
Lymphocytes Relative: 32 % (ref 12–46)
Monocytes Absolute: 0.6 10*3/uL (ref 0.1–1.0)
Monocytes Relative: 7 % (ref 3–12)
Neutro Abs: 4.6 10*3/uL (ref 1.7–7.7)
RBC: 5.25 MIL/uL — ABNORMAL HIGH (ref 3.87–5.11)
WBC: 7.9 10*3/uL (ref 4.0–10.5)

## 2012-09-08 LAB — BASIC METABOLIC PANEL
BUN: 12 mg/dL (ref 6–23)
CO2: 26 mEq/L (ref 19–32)
Chloride: 97 mEq/L (ref 96–112)
Creatinine, Ser: 0.55 mg/dL (ref 0.50–1.10)
Potassium: 4 mEq/L (ref 3.5–5.1)

## 2012-09-08 LAB — URINALYSIS, ROUTINE W REFLEX MICROSCOPIC
Bilirubin Urine: NEGATIVE
Glucose, UA: >1000 mg/dL — AB
Ketones, ur: NEGATIVE mg/dL
Leukocytes, UA: NEGATIVE
Specific Gravity, Urine: 1.037 — ABNORMAL HIGH (ref 1.005–1.030)
pH: 6.5 (ref 5.0–8.0)

## 2012-09-08 LAB — URINE MICROSCOPIC-ADD ON

## 2012-09-08 LAB — GLUCOSE, CAPILLARY

## 2012-09-08 MED ORDER — FLUCONAZOLE 150 MG PO TABS
150.0000 mg | ORAL_TABLET | Freq: Once | ORAL | Status: AC
  Administered 2012-09-08: 150 mg via ORAL
  Filled 2012-09-08: qty 1

## 2012-09-08 MED ORDER — FLUCONAZOLE 200 MG PO TABS: 200.0000 mg | ORAL_TABLET | Freq: Every day | ORAL | Status: AC

## 2012-09-08 MED ORDER — REPAGLINIDE 1 MG PO TABS: 1.0000 mg | ORAL_TABLET | Freq: Three times a day (TID) | ORAL | Status: DC

## 2012-09-08 NOTE — ED Provider Notes (Signed)
History     CSN: [id number]  Arrival date & time 09/08/12  1218   First MD Initiated Contact with Patient 09/08/12 1248      Chief Complaint  Patient presents with  . Cellulitis    (Consider location/radiation/quality/duration/timing/severity/associated sxs/prior treatment) HPI  Patient reports about a month ago she is washed her clothes and used a fabric softener that she had not used for many years. She states this was about a month ago, and she started having intense itching and burning in her groin and it is now spreading to her legs and her rectum. She states she was putting a "cooling gel" over-the-counter from the drugstore which she states is a 1% cortisone ointment. She states that has helped however it is not going away. She states she has borderline diabetes if she follows her diet. She states she does drink a lot which is her usual habit and she also urinates a lot but she also drinks a lot. She denies dysuria or fever. She denies nausea vomiting or diarrhea.  Patient states she can't take metformin because it interferes with her thinking  PCP Dr. Jackquline Denmark first appointment March 18  Past Medical History  Diagnosis Date  . Diabetes mellitus without complication     Past Surgical History  Procedure Laterality Date  . Total hip arthroplasty    . Abdominal hysterectomy    . Cholecystectomy      No family history on file.  History  Substance Use Topics  . Smoking status: Never Smoker   . Smokeless tobacco: Not on file  . Alcohol Use: No  on disability since 1987 for allergic reaction to Seldane D and then for having THR  OB History   Grav Para Term Preterm Abortions TAB SAB Ect Mult Living                  Review of Systems  All other systems reviewed and are negative.    Allergies  Penicillins; Sulfonamide derivatives; Iohexol; Latex; and Metformin  Home Medications   Current Outpatient Rx  Name  Route  Sig  Dispense  Refill  . aspirin EC 81 MG  tablet   Oral   Take 81 mg by mouth daily.         . hydrocortisone cream 1 %   Topical   Apply 1 application topically 2 (two) times daily as needed (FOR RASH).           BP 176/93  Pulse 100  Temp(Src) 98.2 F (36.8 C) (Oral)  Resp 16  SpO2 98%  Vital signs normal except hypertension and borderline tachycardia   Physical Exam  Nursing note and vitals reviewed. Constitutional: She is oriented to person, place, and time. She appears well-developed and well-nourished.  Non-toxic appearance. She does not appear ill. No distress.  HENT:  Head: Normocephalic and atraumatic.  Right Ear: External ear normal.  Left Ear: External ear normal.  Nose: Nose normal. No mucosal edema or rhinorrhea.  Mouth/Throat: Oropharynx is clear and moist and mucous membranes are normal. No dental abscesses or edematous.  Eyes: Conjunctivae and EOM are normal. Pupils are equal, round, and reactive to light.  Neck: Normal range of motion and full passive range of motion without pain. Neck supple.  Cardiovascular: Normal rate, regular rhythm and normal heart sounds.  Exam reveals no gallop and no friction rub.   No murmur heard. Pulmonary/Chest: Effort normal and breath sounds normal. No respiratory distress. She has no wheezes. She  has no rhonchi. She has no rales. She exhibits no tenderness and no crepitus.  Abdominal: Soft. Normal appearance and bowel sounds are normal. She exhibits no distension. There is no tenderness. There is no rebound and no guarding.  Genitourinary:  Intense redness and swelling of her external genitalia including the mons, labia minora and extending to the rectal area. It extends out to the proximal inner thigh. There are no obvious open lesions at this point. There's no lesions consistent with herpes. It appears to be a intense monilial infection or allergic contact dermatitis  Musculoskeletal: Normal range of motion. She exhibits no edema and no tenderness.  Moves all  extremities well.   Neurological: She is alert and oriented to person, place, and time. She has normal strength. No cranial nerve deficit.  Skin: Skin is warm, dry and intact. No rash noted. No erythema. No pallor.  Psychiatric: She has a normal mood and affect. Her speech is normal and behavior is normal. Her mood appears not anxious.    ED Course  Procedures (including critical care time) Medications  fluconazole (DIFLUCAN) tablet 150 mg (not administered)   Patient states she has allergic reaction to sulfa. She also states she cannot take metformin because it makes her not feel right from the first dose she took it. On further research she was started on Prandin.  Results for orders placed during the hospital encounter of 09/08/12  CBC WITH DIFFERENTIAL      Result Value Range   WBC 7.9  4.0 - 10.5 K/uL   RBC 5.25 (*) 3.87 - 5.11 MIL/uL   Hemoglobin 14.5  12.0 - 15.0 g/dL   HCT 16.1  09.6 - 04.5 %   MCV 83.8  78.0 - 100.0 fL   MCH 27.6  26.0 - 34.0 pg   MCHC 33.0  30.0 - 36.0 g/dL   RDW 40.9  81.1 - 91.4 %   Platelets 223  150 - 400 K/uL   Neutrophils Relative 58  43 - 77 %   Neutro Abs 4.6  1.7 - 7.7 K/uL   Lymphocytes Relative 32  12 - 46 %   Lymphs Abs 2.5  0.7 - 4.0 K/uL   Monocytes Relative 7  3 - 12 %   Monocytes Absolute 0.6  0.1 - 1.0 K/uL   Eosinophils Relative 3  0 - 5 %   Eosinophils Absolute 0.3  0.0 - 0.7 K/uL   Basophils Relative 0  0 - 1 %   Basophils Absolute 0.0  0.0 - 0.1 K/uL  BASIC METABOLIC PANEL      Result Value Range   Sodium 135  135 - 145 mEq/L   Potassium 4.0  3.5 - 5.1 mEq/L   Chloride 97  96 - 112 mEq/L   CO2 26  19 - 32 mEq/L   Glucose, Bld 375 (*) 70 - 99 mg/dL   BUN 12  6 - 23 mg/dL   Creatinine, Ser 7.82  0.50 - 1.10 mg/dL   Calcium 9.1  8.4 - 95.6 mg/dL   GFR calc non Af Amer >90  >90 mL/min   GFR calc Af Amer >90  >90 mL/min  GLUCOSE, CAPILLARY      Result Value Range   Glucose-Capillary 299 (*) 70 - 99 mg/dL   Comment 1 Notify  RN     Laboratory interpretation all normal except hyperglycemia .    1. Monilial vulvovaginitis   2. Hyperglycemia     New Prescriptions   FLUCONAZOLE (  DIFLUCAN) 200 MG TABLET    Take 1 tablet (200 mg total) by mouth daily.   REPAGLINIDE (PRANDIN) 1 MG TABLET    Take 1 tablet (1 mg total) by mouth 3 (three) times daily before meals.    Plan discharge  Devoria Albe, MD, FACEP   MDM          Ward Givens, MD 09/08/12 [phone]

## 2012-09-08 NOTE — ED Notes (Signed)
States that she has some type of infection in her pubic area for the past month. States that she has open sores and the area itches badly. States that she also has a white thick discharge.

## 2012-09-20 DIAGNOSIS — I10 Essential (primary) hypertension: Secondary | ICD-10-CM | POA: Diagnosis not present

## 2012-09-21 DIAGNOSIS — I10 Essential (primary) hypertension: Secondary | ICD-10-CM | POA: Diagnosis not present

## 2012-09-29 DIAGNOSIS — Z1231 Encounter for screening mammogram for malignant neoplasm of breast: Secondary | ICD-10-CM | POA: Diagnosis not present

## 2012-10-05 DIAGNOSIS — N6009 Solitary cyst of unspecified breast: Secondary | ICD-10-CM | POA: Diagnosis not present

## 2012-10-05 DIAGNOSIS — Z09 Encounter for follow-up examination after completed treatment for conditions other than malignant neoplasm: Secondary | ICD-10-CM | POA: Diagnosis not present

## 2012-10-11 DIAGNOSIS — E669 Obesity, unspecified: Secondary | ICD-10-CM | POA: Diagnosis not present

## 2012-10-11 DIAGNOSIS — E119 Type 2 diabetes mellitus without complications: Secondary | ICD-10-CM | POA: Diagnosis not present

## 2012-10-11 DIAGNOSIS — I10 Essential (primary) hypertension: Secondary | ICD-10-CM | POA: Diagnosis not present

## 2012-10-26 DIAGNOSIS — E119 Type 2 diabetes mellitus without complications: Secondary | ICD-10-CM | POA: Diagnosis not present

## 2012-10-26 DIAGNOSIS — E7800 Pure hypercholesterolemia, unspecified: Secondary | ICD-10-CM | POA: Diagnosis not present

## 2012-10-26 DIAGNOSIS — I10 Essential (primary) hypertension: Secondary | ICD-10-CM | POA: Diagnosis not present

## 2012-11-16 DIAGNOSIS — E119 Type 2 diabetes mellitus without complications: Secondary | ICD-10-CM | POA: Diagnosis not present

## 2012-12-28 DIAGNOSIS — I10 Essential (primary) hypertension: Secondary | ICD-10-CM | POA: Diagnosis not present

## 2012-12-28 DIAGNOSIS — E1169 Type 2 diabetes mellitus with other specified complication: Secondary | ICD-10-CM | POA: Diagnosis not present

## 2012-12-28 DIAGNOSIS — E785 Hyperlipidemia, unspecified: Secondary | ICD-10-CM | POA: Diagnosis not present

## 2012-12-28 DIAGNOSIS — Z6841 Body Mass Index (BMI) 40.0 and over, adult: Secondary | ICD-10-CM | POA: Diagnosis not present

## 2013-01-27 DIAGNOSIS — E785 Hyperlipidemia, unspecified: Secondary | ICD-10-CM | POA: Diagnosis not present

## 2013-01-27 DIAGNOSIS — I10 Essential (primary) hypertension: Secondary | ICD-10-CM | POA: Diagnosis not present

## 2013-01-31 DIAGNOSIS — L988 Other specified disorders of the skin and subcutaneous tissue: Secondary | ICD-10-CM | POA: Diagnosis not present

## 2013-01-31 DIAGNOSIS — E1149 Type 2 diabetes mellitus with other diabetic neurological complication: Secondary | ICD-10-CM | POA: Diagnosis not present

## 2013-01-31 DIAGNOSIS — L608 Other nail disorders: Secondary | ICD-10-CM | POA: Diagnosis not present

## 2013-02-02 DIAGNOSIS — I10 Essential (primary) hypertension: Secondary | ICD-10-CM | POA: Diagnosis not present

## 2013-03-16 DIAGNOSIS — E785 Hyperlipidemia, unspecified: Secondary | ICD-10-CM | POA: Diagnosis not present

## 2013-03-16 DIAGNOSIS — Z6841 Body Mass Index (BMI) 40.0 and over, adult: Secondary | ICD-10-CM | POA: Diagnosis not present

## 2013-03-16 DIAGNOSIS — I10 Essential (primary) hypertension: Secondary | ICD-10-CM | POA: Diagnosis not present

## 2013-04-25 ENCOUNTER — Ambulatory Visit (INDEPENDENT_AMBULATORY_CARE_PROVIDER_SITE_OTHER): Payer: Medicare Other

## 2013-04-25 VITALS — BP 121/76 | HR 101 | Resp 12 | Ht 61.0 in | Wt 210.0 lb

## 2013-04-25 DIAGNOSIS — E1142 Type 2 diabetes mellitus with diabetic polyneuropathy: Secondary | ICD-10-CM

## 2013-04-25 DIAGNOSIS — Q828 Other specified congenital malformations of skin: Secondary | ICD-10-CM | POA: Diagnosis not present

## 2013-04-25 DIAGNOSIS — L608 Other nail disorders: Secondary | ICD-10-CM | POA: Diagnosis not present

## 2013-04-25 DIAGNOSIS — E1140 Type 2 diabetes mellitus with diabetic neuropathy, unspecified: Secondary | ICD-10-CM

## 2013-04-25 DIAGNOSIS — E1149 Type 2 diabetes mellitus with other diabetic neurological complication: Secondary | ICD-10-CM

## 2013-04-25 NOTE — Progress Notes (Signed)
  Subjective:    Patient ID: Kelly Richardson, female    DOB: June 23, 1951, 62 y.o.   MRN: [id number]  HPI Comments: '' TRIM TOENAILS AND CALLUSES''  no changes medication her health history. Patient presents for foot and nail care. Nails thick brittle dystrophic and incurvated. Also pinch callus both hallux IP joints bilateral with fissuring. Patient been applying lotion occasionally.   Review of Systems  Constitutional: Negative.   HENT: Negative.   Eyes: Positive for visual disturbance.  Respiratory: Positive for cough.   Cardiovascular: Positive for leg swelling.  Gastrointestinal: Negative.   Endocrine: Negative.   Genitourinary: Negative.   Musculoskeletal: Negative.   Skin: Negative.   Allergic/Immunologic: Negative.   Neurological: Negative.   Hematological: Negative.   Psychiatric/Behavioral: Negative.        Objective:   Physical Exam Vascular status is intact with pedal pulses palpable DP pulse one over 4 and PT pulse one over 4 bilateral skin temperature is warm turgor diminished no edema rubor pallor noted. Neurologically epicritic and proprioceptive sensations intact and symmetric bilateral decreased sensation Semmes Weinstein testing to the forefoot and plantar digits bilateral pinch callus both hallux noted with fissuring and hemorrhage a keratoses. There is pinpoint bleeding debridement which is treated with lidocaine Neosporin and Band-Aid dressings. Nails thick friable discolored brittle tender on palpation and with enclosed shoe wear and ambulation. Mild flexible digital contractures noted bilateral.       Assessment & Plan:  Diabetes with neuropathy. Dystrophy progress hallux nails debrided x10 the presence of diabetes cocking factors return for future palliative care and [place] every 3 months also debridement multiple keratoses both hallux  Alvan Dame DP

## 2013-04-25 NOTE — Patient Instructions (Signed)

## 2013-05-05 DIAGNOSIS — I10 Essential (primary) hypertension: Secondary | ICD-10-CM | POA: Diagnosis not present

## 2013-05-05 DIAGNOSIS — E1165 Type 2 diabetes mellitus with hyperglycemia: Secondary | ICD-10-CM | POA: Diagnosis not present

## 2013-05-05 DIAGNOSIS — E785 Hyperlipidemia, unspecified: Secondary | ICD-10-CM | POA: Diagnosis not present

## 2013-05-09 DIAGNOSIS — I10 Essential (primary) hypertension: Secondary | ICD-10-CM | POA: Diagnosis not present

## 2013-05-09 DIAGNOSIS — Z0000 Encounter for general adult medical examination without abnormal findings: Secondary | ICD-10-CM | POA: Diagnosis not present

## 2013-05-09 DIAGNOSIS — E785 Hyperlipidemia, unspecified: Secondary | ICD-10-CM | POA: Diagnosis not present

## 2013-05-12 DIAGNOSIS — Z0000 Encounter for general adult medical examination without abnormal findings: Secondary | ICD-10-CM | POA: Diagnosis not present

## 2013-05-12 DIAGNOSIS — E785 Hyperlipidemia, unspecified: Secondary | ICD-10-CM | POA: Diagnosis not present

## 2013-05-12 DIAGNOSIS — R269 Unspecified abnormalities of gait and mobility: Secondary | ICD-10-CM | POA: Diagnosis not present

## 2013-05-12 DIAGNOSIS — I10 Essential (primary) hypertension: Secondary | ICD-10-CM | POA: Diagnosis not present

## 2013-05-12 DIAGNOSIS — Z6841 Body Mass Index (BMI) 40.0 and over, adult: Secondary | ICD-10-CM | POA: Diagnosis not present

## 2013-05-12 DIAGNOSIS — E1165 Type 2 diabetes mellitus with hyperglycemia: Secondary | ICD-10-CM | POA: Diagnosis not present

## 2013-05-23 DIAGNOSIS — Z1212 Encounter for screening for malignant neoplasm of rectum: Secondary | ICD-10-CM | POA: Diagnosis not present

## 2013-07-19 DIAGNOSIS — Z6841 Body Mass Index (BMI) 40.0 and over, adult: Secondary | ICD-10-CM | POA: Diagnosis not present

## 2013-07-19 DIAGNOSIS — L738 Other specified follicular disorders: Secondary | ICD-10-CM | POA: Diagnosis not present

## 2013-07-19 DIAGNOSIS — L678 Other hair color and hair shaft abnormalities: Secondary | ICD-10-CM | POA: Diagnosis not present

## 2013-07-25 ENCOUNTER — Ambulatory Visit: Payer: Medicare Other

## 2013-08-14 DIAGNOSIS — E1165 Type 2 diabetes mellitus with hyperglycemia: Secondary | ICD-10-CM | POA: Diagnosis not present

## 2013-08-14 DIAGNOSIS — IMO0002 Reserved for concepts with insufficient information to code with codable children: Secondary | ICD-10-CM | POA: Diagnosis not present

## 2013-08-14 DIAGNOSIS — E785 Hyperlipidemia, unspecified: Secondary | ICD-10-CM | POA: Diagnosis not present

## 2013-08-14 DIAGNOSIS — Z6841 Body Mass Index (BMI) 40.0 and over, adult: Secondary | ICD-10-CM | POA: Diagnosis not present

## 2013-08-14 DIAGNOSIS — I10 Essential (primary) hypertension: Secondary | ICD-10-CM | POA: Diagnosis not present

## 2013-08-14 DIAGNOSIS — L738 Other specified follicular disorders: Secondary | ICD-10-CM | POA: Diagnosis not present

## 2013-08-14 DIAGNOSIS — L678 Other hair color and hair shaft abnormalities: Secondary | ICD-10-CM | POA: Diagnosis not present

## 2013-08-22 ENCOUNTER — Ambulatory Visit: Payer: Medicare Other

## 2013-09-08 ENCOUNTER — Ambulatory Visit: Payer: Medicare Other

## 2013-10-10 ENCOUNTER — Ambulatory Visit (INDEPENDENT_AMBULATORY_CARE_PROVIDER_SITE_OTHER): Payer: Medicare Other

## 2013-10-10 DIAGNOSIS — L608 Other nail disorders: Secondary | ICD-10-CM | POA: Diagnosis not present

## 2013-10-10 DIAGNOSIS — E1142 Type 2 diabetes mellitus with diabetic polyneuropathy: Secondary | ICD-10-CM

## 2013-10-10 DIAGNOSIS — E1140 Type 2 diabetes mellitus with diabetic neuropathy, unspecified: Secondary | ICD-10-CM

## 2013-10-10 DIAGNOSIS — Q828 Other specified congenital malformations of skin: Secondary | ICD-10-CM | POA: Diagnosis not present

## 2013-10-10 DIAGNOSIS — E1149 Type 2 diabetes mellitus with other diabetic neurological complication: Secondary | ICD-10-CM

## 2013-10-10 NOTE — Progress Notes (Signed)
   Subjective:    Patient ID: Kelly Richardson, female    DOB: [birth year], 63 y.o.   MRN: [id number]  HPI Comments: "Trimming the toenails and calluses"       Review of Systems no new changes or findings     Objective:   Physical Exam [place] objective findings as follows pedal pulses DP plus one over 4 PT one over 4 bilateral capillary refill timed 3-4 seconds all digits neurologically epicritic and proprioceptive sensations intact although diminished bilateral on Semmes Weinstein testing to the digits and plantar forefoot arch. Orthopedic biomechanical exam reveals rectus foot type mild digital contractures 2 through 5 bilateral there is keratoses pinch callus of the hallux as well as distal keratoses distal clavus and lateral nail fold of the fifth toe left foot. Orthopedic biomechanical exam reveals digital contractures patient walks with the assistance of a walker.       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

## 2013-10-10 NOTE — Patient Instructions (Signed)
Diabetes and Foot Care Diabetes may cause you to have problems because of poor blood supply (circulation) to your feet and legs. This may cause the skin on your feet to become thinner, break easier, and heal more slowly. Your skin may become dry, and the skin may peel and crack. You may also have nerve damage in your legs and feet causing decreased feeling in them. You may not notice minor injuries to your feet that could lead to infections or more serious problems. Taking care of your feet is one of the most important things you can do for yourself.  HOME CARE INSTRUCTIONS  Wear shoes at all times, even in the house. Do not go barefoot. Bare feet are easily injured.  Check your feet daily for blisters, cuts, and redness. If you cannot see the bottom of your feet, use a mirror or ask someone for help.  Wash your feet with warm water (do not use hot water) and mild soap. Then pat your feet and the areas between your toes until they are completely dry. Do not soak your feet as this can dry your skin.  Apply a moisturizing lotion or petroleum jelly (that does not contain alcohol and is unscented) to the skin on your feet and to dry, brittle toenails. Do not apply lotion between your toes.  Trim your toenails straight across. Do not dig under them or around the cuticle. File the edges of your nails with an emery board or nail file.  Do not cut corns or calluses or try to remove them with medicine.  Wear clean socks or stockings every day. Make sure they are not too tight. Do not wear knee-high stockings since they may decrease blood flow to your legs.  Wear shoes that fit properly and have enough cushioning. To break in new shoes, wear them for just a few hours a day. This prevents you from injuring your feet. Always look in your shoes before you put them on to be sure there are no objects inside.  Do not cross your legs. This may decrease the blood flow to your feet.  If you find a minor scrape,  cut, or break in the skin on your feet, keep it and the skin around it clean and dry. These areas may be cleansed with mild soap and water. Do not cleanse the area with peroxide, alcohol, or iodine.  When you remove an adhesive bandage, be sure not to damage the skin around it.  If you have a wound, look at it several times a day to make sure it is healing.  Do not use heating pads or hot water bottles. They may burn your skin. If you have lost feeling in your feet or legs, you may not know it is happening until it is too late.  Make sure your health care provider performs a complete foot exam at least annually or more often if you have foot problems. Report any cuts, sores, or bruises to your health care provider immediately. SEEK MEDICAL CARE IF:   You have an injury that is not healing.  You have cuts or breaks in the skin.  You have an ingrown nail.  You notice redness on your legs or feet.  You feel burning or tingling in your legs or feet.  You have pain or cramps in your legs and feet.  Your legs or feet are numb.  Your feet always feel cold. SEEK IMMEDIATE MEDICAL CARE IF:   There is increasing redness,   swelling, or pain in or around a wound.  There is a red line that goes up your leg.  Pus is coming from a wound.  You develop a fever or as directed by your health care provider.  You notice a bad smell coming from an ulcer or wound. Document Released: 06/19/2000 Document Revised: 02/22/2013 Document Reviewed: 11/29/2012 [employer] Patient Information 2014 [employer].  

## 2013-11-15 DIAGNOSIS — M6281 Muscle weakness (generalized): Secondary | ICD-10-CM | POA: Diagnosis not present

## 2013-11-15 DIAGNOSIS — R269 Unspecified abnormalities of gait and mobility: Secondary | ICD-10-CM | POA: Diagnosis not present

## 2013-11-20 DIAGNOSIS — R269 Unspecified abnormalities of gait and mobility: Secondary | ICD-10-CM | POA: Diagnosis not present

## 2013-11-20 DIAGNOSIS — M6281 Muscle weakness (generalized): Secondary | ICD-10-CM | POA: Diagnosis not present

## 2013-11-21 DIAGNOSIS — R269 Unspecified abnormalities of gait and mobility: Secondary | ICD-10-CM | POA: Diagnosis not present

## 2013-11-21 DIAGNOSIS — M6281 Muscle weakness (generalized): Secondary | ICD-10-CM | POA: Diagnosis not present

## 2013-11-23 DIAGNOSIS — M6281 Muscle weakness (generalized): Secondary | ICD-10-CM | POA: Diagnosis not present

## 2013-11-23 DIAGNOSIS — R269 Unspecified abnormalities of gait and mobility: Secondary | ICD-10-CM | POA: Diagnosis not present

## 2013-11-29 DIAGNOSIS — R269 Unspecified abnormalities of gait and mobility: Secondary | ICD-10-CM | POA: Diagnosis not present

## 2013-11-29 DIAGNOSIS — M6281 Muscle weakness (generalized): Secondary | ICD-10-CM | POA: Diagnosis not present

## 2013-12-01 DIAGNOSIS — R269 Unspecified abnormalities of gait and mobility: Secondary | ICD-10-CM | POA: Diagnosis not present

## 2013-12-01 DIAGNOSIS — IMO0001 Reserved for inherently not codable concepts without codable children: Secondary | ICD-10-CM | POA: Diagnosis not present

## 2013-12-01 DIAGNOSIS — I10 Essential (primary) hypertension: Secondary | ICD-10-CM | POA: Diagnosis not present

## 2013-12-01 DIAGNOSIS — M129 Arthropathy, unspecified: Secondary | ICD-10-CM | POA: Diagnosis not present

## 2013-12-01 DIAGNOSIS — M6281 Muscle weakness (generalized): Secondary | ICD-10-CM | POA: Diagnosis not present

## 2013-12-01 DIAGNOSIS — E785 Hyperlipidemia, unspecified: Secondary | ICD-10-CM | POA: Diagnosis not present

## 2013-12-05 DIAGNOSIS — R269 Unspecified abnormalities of gait and mobility: Secondary | ICD-10-CM | POA: Diagnosis not present

## 2013-12-05 DIAGNOSIS — M6281 Muscle weakness (generalized): Secondary | ICD-10-CM | POA: Diagnosis not present

## 2013-12-07 DIAGNOSIS — M6281 Muscle weakness (generalized): Secondary | ICD-10-CM | POA: Diagnosis not present

## 2013-12-07 DIAGNOSIS — R269 Unspecified abnormalities of gait and mobility: Secondary | ICD-10-CM | POA: Diagnosis not present

## 2013-12-12 DIAGNOSIS — R269 Unspecified abnormalities of gait and mobility: Secondary | ICD-10-CM | POA: Diagnosis not present

## 2013-12-12 DIAGNOSIS — M6281 Muscle weakness (generalized): Secondary | ICD-10-CM | POA: Diagnosis not present

## 2013-12-15 DIAGNOSIS — M6281 Muscle weakness (generalized): Secondary | ICD-10-CM | POA: Diagnosis not present

## 2013-12-15 DIAGNOSIS — R269 Unspecified abnormalities of gait and mobility: Secondary | ICD-10-CM | POA: Diagnosis not present

## 2013-12-18 DIAGNOSIS — R269 Unspecified abnormalities of gait and mobility: Secondary | ICD-10-CM | POA: Diagnosis not present

## 2013-12-18 DIAGNOSIS — M6281 Muscle weakness (generalized): Secondary | ICD-10-CM | POA: Diagnosis not present

## 2013-12-22 DIAGNOSIS — R269 Unspecified abnormalities of gait and mobility: Secondary | ICD-10-CM | POA: Diagnosis not present

## 2013-12-22 DIAGNOSIS — M6281 Muscle weakness (generalized): Secondary | ICD-10-CM | POA: Diagnosis not present

## 2013-12-25 DIAGNOSIS — R269 Unspecified abnormalities of gait and mobility: Secondary | ICD-10-CM | POA: Diagnosis not present

## 2013-12-25 DIAGNOSIS — M6281 Muscle weakness (generalized): Secondary | ICD-10-CM | POA: Diagnosis not present

## 2013-12-29 DIAGNOSIS — R269 Unspecified abnormalities of gait and mobility: Secondary | ICD-10-CM | POA: Diagnosis not present

## 2013-12-29 DIAGNOSIS — M6281 Muscle weakness (generalized): Secondary | ICD-10-CM | POA: Diagnosis not present

## 2014-01-08 DIAGNOSIS — R269 Unspecified abnormalities of gait and mobility: Secondary | ICD-10-CM | POA: Diagnosis not present

## 2014-01-08 DIAGNOSIS — M6281 Muscle weakness (generalized): Secondary | ICD-10-CM | POA: Diagnosis not present

## 2014-01-09 ENCOUNTER — Ambulatory Visit (INDEPENDENT_AMBULATORY_CARE_PROVIDER_SITE_OTHER): Payer: Medicare Other

## 2014-01-09 VITALS — BP 120/71 | HR 104 | Resp 16

## 2014-01-09 DIAGNOSIS — E1149 Type 2 diabetes mellitus with other diabetic neurological complication: Secondary | ICD-10-CM | POA: Diagnosis not present

## 2014-01-09 DIAGNOSIS — L608 Other nail disorders: Secondary | ICD-10-CM | POA: Diagnosis not present

## 2014-01-09 DIAGNOSIS — Q828 Other specified congenital malformations of skin: Secondary | ICD-10-CM | POA: Diagnosis not present

## 2014-01-09 DIAGNOSIS — E1142 Type 2 diabetes mellitus with diabetic polyneuropathy: Secondary | ICD-10-CM | POA: Diagnosis not present

## 2014-01-09 DIAGNOSIS — E1140 Type 2 diabetes mellitus with diabetic neuropathy, unspecified: Secondary | ICD-10-CM

## 2014-01-09 MED ORDER — TAVABOROLE 5 % EX SOLN: CUTANEOUS | Status: DC

## 2014-01-09 NOTE — Progress Notes (Signed)
   Subjective:    Patient ID: Kelly Richardson, female    DOB: [birth year], 63 y.o.   MRN: [id number]  HPI Comments: trims the nails and files the callus     Review of Systems no new systemic changes or findings the    Objective:   Physical Exam Lower extremity objective findings unchanged pedal pulses DP plus one over 4 bilateral PT plus one over 4 bilateral Her refill timed 3-4 seconds decreased sensation Semmes Weinstein testing digits plantar foot and arch. Patient had an injury to her right leg walk with assistance of a walker has a open wound on that leg has been treated. Orthopedic biomechanical exam otherwise unremarkable except for rigid digital contractures callus in the hallux area due to hallux IP joints deformity and hallux limitus deformity. Pinch callus both great toes is identified there is also thick brittle crumbly friable dystrophic nails 1 through 5 bilateral. No open wounds ulcerations no other infection is noted. Patient does have thickness brittleness and crack kyphosis of nails consistent with onychomycosis request treatment patient may be K. for topical antifungal treatment will initiate keratin prescription and provide prescription to the patient for topical antifungal once daily for 12 months duration       Assessment & Plan:  Assessment this time diabetes with complications and peripheral neuropathy also in a bike onychomycosis criptotic nails painful tender symptomatic mycotic nails 1 through 5 bilateral this time are debrided as well as multiple keratoses being debrided prescription for keratin [facility] patient will be followup in 3 months for continued palliative care in the future as needed  Alvan Dameichard Caylei Sperry DPM

## 2014-01-09 NOTE — Patient Instructions (Addendum)
Diabetes and Foot Care Diabetes may cause you to have problems because of poor blood supply (circulation) to your feet and legs. This may cause the skin on your feet to become thinner, break easier, and heal more slowly. Your skin may become dry, and the skin may peel and crack. You may also have nerve damage in your legs and feet causing decreased feeling in them. You may not notice minor injuries to your feet that could lead to infections or more serious problems. Taking care of your feet is one of the most important things you can do for yourself.  HOME CARE INSTRUCTIONS  Wear shoes at all times, even in the house. Do not go barefoot. Bare feet are easily injured.  Check your feet daily for blisters, cuts, and redness. If you cannot see the bottom of your feet, use a mirror or ask someone for help.  Wash your feet with warm water (do not use hot water) and mild soap. Then pat your feet and the areas between your toes until they are completely dry. Do not soak your feet as this can dry your skin.  Apply a moisturizing lotion or petroleum jelly (that does not contain alcohol and is unscented) to the skin on your feet and to dry, brittle toenails. Do not apply lotion between your toes.  Trim your toenails straight across. Do not dig under them or around the cuticle. File the edges of your nails with an emery board or nail file.  Do not cut corns or calluses or try to remove them with medicine.  Wear clean socks or stockings every day. Make sure they are not too tight. Do not wear knee-high stockings since they may decrease blood flow to your legs.  Wear shoes that fit properly and have enough cushioning. To break in new shoes, wear them for just a few hours a day. This prevents you from injuring your feet. Always look in your shoes before you put them on to be sure there are no objects inside.  Do not cross your legs. This may decrease the blood flow to your feet.  If you find a minor scrape,  cut, or break in the skin on your feet, keep it and the skin around it clean and dry. These areas may be cleansed with mild soap and water. Do not cleanse the area with peroxide, alcohol, or iodine.  When you remove an adhesive bandage, be sure not to damage the skin around it.  If you have a wound, look at it several times a day to make sure it is healing.  Do not use heating pads or hot water bottles. They may burn your skin. If you have lost feeling in your feet or legs, you may not know it is happening until it is too late.  Make sure your health care provider performs a complete foot exam at least annually or more often if you have foot problems. Report any cuts, sores, or bruises to your health care provider immediately. SEEK MEDICAL CARE IF:   You have an injury that is not healing.  You have cuts or breaks in the skin.  You have an ingrown nail.  You notice redness on your legs or feet.  You feel burning or tingling in your legs or feet.  You have pain or cramps in your legs and feet.  Your legs or feet are numb.  Your feet always feel cold. SEEK IMMEDIATE MEDICAL CARE IF:   There is increasing redness,   swelling, or pain in or around a wound.  There is a red line that goes up your leg.  Pus is coming from a wound.  You develop a fever or as directed by your health care provider.  You notice a bad smell coming from an ulcer or wound. Document Released: 06/19/2000 Document Revised: 02/22/2013 Document Reviewed: 11/29/2012 [facility] Patient Information 2015 [place], [place]. This information is not intended to replace advice given to you by your health care provider. Make sure you discuss any questions you have with your health care provider.      Onychomycosis/Fungal Toenails  WHAT IS IT? An infection that lies within the keratin of your nail plate that is caused by a fungus.  WHY ME? Fungal infections affect all ages, sexes, races, and creeds.  There may be many  factors that predispose you to a fungal infection such as age, coexisting medical conditions such as diabetes, or an autoimmune disease; stress, medications, fatigue, genetics, etc.  Bottom line: fungus thrives in a warm, moist environment and your shoes offer such a location.  IS IT CONTAGIOUS? Theoretically, yes.  You do not want to share shoes, nail clippers or files with someone who has fungal toenails.  Walking around barefoot in the same room or sleeping in the same bed is unlikely to transfer the organism.  It is important to realize, however, that fungus can spread easily from one nail to the next on the same foot.  HOW DO WE TREAT THIS?  There are several ways to treat this condition.  Treatment may depend on many factors such as age, medications, pregnancy, liver and kidney conditions, etc.  It is best to ask your doctor which options are available to you.  1. No treatment.   Unlike many other medical concerns, you can live with this condition.  However for many people this can be a painful condition and may lead to ingrown toenails or a bacterial infection.  It is recommended that you keep the nails cut short to help reduce the amount of fungal nail. 2. Topical treatment.  These range from herbal remedies to prescription strength nail lacquers.  About 40-50% effective, topicals require twice daily application for approximately 9 to 12 months or until an entirely new nail has grown out.  The most effective topicals are medical grade medications available through physicians offices. 3. Oral antifungal medications.  With an 80-90% cure rate, the most common oral medication requires 3 to 4 months of therapy and stays in your system for a year as the new nail grows out.  Oral antifungal medications do require blood work to make sure it is a safe drug for you.  A liver function panel will be performed prior to starting the medication and after the first month of treatment.  It is important to have the  blood work performed to avoid any harmful side effects.  In general, this medication safe but blood work is required. 4. Laser Therapy.  This treatment is performed by applying a specialized laser to the affected nail plate.  This therapy is noninvasive, fast, and non-painful.  It is not covered by insurance and is therefore, out of pocket.  The results have been very good with a 80-95% cure rate.  The [facility] is the only practice in the area to offer this therapy. 5. Permanent Nail Avulsion.  Removing the entire nail so that a new nail will not grow back.   A prescription for Jodi Geraldskerydin has been forwarded  to pharmacy once is delivered to her home apply 1 drop each affected toenail once daily for 12 months duration discontinue any skin irritation occurs. Follow instructions for applications as indicated 1 drop per toe per day

## 2014-01-11 DIAGNOSIS — R269 Unspecified abnormalities of gait and mobility: Secondary | ICD-10-CM | POA: Diagnosis not present

## 2014-01-11 DIAGNOSIS — M6281 Muscle weakness (generalized): Secondary | ICD-10-CM | POA: Diagnosis not present

## 2014-01-15 DIAGNOSIS — R269 Unspecified abnormalities of gait and mobility: Secondary | ICD-10-CM | POA: Diagnosis not present

## 2014-01-15 DIAGNOSIS — M6281 Muscle weakness (generalized): Secondary | ICD-10-CM | POA: Diagnosis not present

## 2014-01-22 DIAGNOSIS — R269 Unspecified abnormalities of gait and mobility: Secondary | ICD-10-CM | POA: Diagnosis not present

## 2014-01-22 DIAGNOSIS — M6281 Muscle weakness (generalized): Secondary | ICD-10-CM | POA: Diagnosis not present

## 2014-01-25 DIAGNOSIS — M6281 Muscle weakness (generalized): Secondary | ICD-10-CM | POA: Diagnosis not present

## 2014-01-25 DIAGNOSIS — R269 Unspecified abnormalities of gait and mobility: Secondary | ICD-10-CM | POA: Diagnosis not present

## 2014-01-30 DIAGNOSIS — R269 Unspecified abnormalities of gait and mobility: Secondary | ICD-10-CM | POA: Diagnosis not present

## 2014-01-30 DIAGNOSIS — M6281 Muscle weakness (generalized): Secondary | ICD-10-CM | POA: Diagnosis not present

## 2014-02-01 DIAGNOSIS — M6281 Muscle weakness (generalized): Secondary | ICD-10-CM | POA: Diagnosis not present

## 2014-02-01 DIAGNOSIS — R269 Unspecified abnormalities of gait and mobility: Secondary | ICD-10-CM | POA: Diagnosis not present

## 2014-03-02 DIAGNOSIS — R32 Unspecified urinary incontinence: Secondary | ICD-10-CM | POA: Diagnosis not present

## 2014-03-02 DIAGNOSIS — E119 Type 2 diabetes mellitus without complications: Secondary | ICD-10-CM | POA: Diagnosis not present

## 2014-03-02 DIAGNOSIS — Z6841 Body Mass Index (BMI) 40.0 and over, adult: Secondary | ICD-10-CM | POA: Diagnosis not present

## 2014-03-02 DIAGNOSIS — I10 Essential (primary) hypertension: Secondary | ICD-10-CM | POA: Diagnosis not present

## 2014-03-02 DIAGNOSIS — R269 Unspecified abnormalities of gait and mobility: Secondary | ICD-10-CM | POA: Diagnosis not present

## 2014-03-02 DIAGNOSIS — E785 Hyperlipidemia, unspecified: Secondary | ICD-10-CM | POA: Diagnosis not present

## 2014-04-17 ENCOUNTER — Ambulatory Visit: Payer: Medicare Other

## 2014-04-19 ENCOUNTER — Ambulatory Visit (INDEPENDENT_AMBULATORY_CARE_PROVIDER_SITE_OTHER): Payer: Medicare Other | Admitting: Podiatrist

## 2014-04-19 DIAGNOSIS — B351 Tinea unguium: Secondary | ICD-10-CM

## 2014-04-19 DIAGNOSIS — M79676 Pain in unspecified toe(s): Secondary | ICD-10-CM | POA: Diagnosis not present

## 2014-04-19 DIAGNOSIS — Q828 Other specified congenital malformations of skin: Secondary | ICD-10-CM

## 2014-04-19 DIAGNOSIS — E1140 Type 2 diabetes mellitus with diabetic neuropathy, unspecified: Secondary | ICD-10-CM

## 2014-04-23 NOTE — Progress Notes (Signed)
Chief Complaint  Patient presents with  . debridement    "I need to get my toenails and callouses."  10 toenails and callouses B/L 1st medial toes     HPI: Patient is 63 y.o. female who presents today for continued foot and nail care.  She is diabetic with neuropathy present.    Physical Exam  Patient is awake, alert, and oriented x 3.  In no acute distress.  Patient uses a walker for ambulation assistance. Vascular status is intact with palpable pedal pulses at 1/4 DP and PT bilateral and capillary refill time within normal limits. Neurological sensation is decreased  bilaterally via Semmes Weinstein monofilament at 0/5 sites. Light touch, vibratory sensation, Achilles tendon reflex is diminshed. Rectus foot type with digital contractures noted to through 5. Pinch callus on the hallux and distal keratosis of the left fifth toe is noted. Patient's toenails are elongated, thickened, dystrophic, symptomatic, clinically mycotic.   Assessment: Diabetic foot evaluation and symptomatic mycotic toenails  Plan: Plan: Debridement of the toenails was carried out without complication. Debridement of hyperkeratotic lesion was also carried out. She'll be seen back for routine care.

## 2014-05-08 DIAGNOSIS — I10 Essential (primary) hypertension: Secondary | ICD-10-CM | POA: Diagnosis not present

## 2014-05-08 DIAGNOSIS — E785 Hyperlipidemia, unspecified: Secondary | ICD-10-CM | POA: Diagnosis not present

## 2014-05-08 DIAGNOSIS — E119 Type 2 diabetes mellitus without complications: Secondary | ICD-10-CM | POA: Diagnosis not present

## 2014-05-08 DIAGNOSIS — Z79899 Other long term (current) drug therapy: Secondary | ICD-10-CM | POA: Diagnosis not present

## 2014-05-11 DIAGNOSIS — Z803 Family history of malignant neoplasm of breast: Secondary | ICD-10-CM | POA: Diagnosis not present

## 2014-05-11 DIAGNOSIS — Z1231 Encounter for screening mammogram for malignant neoplasm of breast: Secondary | ICD-10-CM | POA: Diagnosis not present

## 2014-05-15 ENCOUNTER — Other Ambulatory Visit: Payer: Self-pay | Admitting: Internal Medicine

## 2014-05-15 DIAGNOSIS — R740 Nonspecific elevation of levels of transaminase and lactic acid dehydrogenase [LDH]: Secondary | ICD-10-CM | POA: Diagnosis not present

## 2014-05-15 DIAGNOSIS — E785 Hyperlipidemia, unspecified: Secondary | ICD-10-CM | POA: Diagnosis not present

## 2014-05-15 DIAGNOSIS — Z0000 Encounter for general adult medical examination without abnormal findings: Secondary | ICD-10-CM | POA: Diagnosis not present

## 2014-05-15 DIAGNOSIS — Z6841 Body Mass Index (BMI) 40.0 and over, adult: Secondary | ICD-10-CM | POA: Diagnosis not present

## 2014-05-15 DIAGNOSIS — S81801S Unspecified open wound, right lower leg, sequela: Secondary | ICD-10-CM

## 2014-05-15 DIAGNOSIS — I10 Essential (primary) hypertension: Secondary | ICD-10-CM | POA: Diagnosis not present

## 2014-05-15 DIAGNOSIS — E119 Type 2 diabetes mellitus without complications: Secondary | ICD-10-CM | POA: Diagnosis not present

## 2014-05-16 DIAGNOSIS — Z1212 Encounter for screening for malignant neoplasm of rectum: Secondary | ICD-10-CM | POA: Diagnosis not present

## 2014-05-18 ENCOUNTER — Other Ambulatory Visit: Payer: Self-pay | Admitting: Internal Medicine

## 2014-05-18 ENCOUNTER — Ambulatory Visit
Admission: RE | Admit: 2014-05-18 | Discharge: 2014-05-18 | Disposition: A | Discharge status: Home / Self Care | Payer: Medicare Other | Source: Ambulatory Visit | Attending: Internal Medicine | Admitting: Internal Medicine

## 2014-05-18 DIAGNOSIS — S81801S Unspecified open wound, right lower leg, sequela: Secondary | ICD-10-CM | POA: Diagnosis not present

## 2014-05-29 DIAGNOSIS — L02419 Cutaneous abscess of limb, unspecified: Secondary | ICD-10-CM | POA: Diagnosis not present

## 2014-06-01 DIAGNOSIS — Z96641 Presence of right artificial hip joint: Secondary | ICD-10-CM | POA: Diagnosis not present

## 2014-06-01 DIAGNOSIS — M1611 Unilateral primary osteoarthritis, right hip: Secondary | ICD-10-CM | POA: Diagnosis not present

## 2014-06-07 ENCOUNTER — Other Ambulatory Visit (HOSPITAL_COMMUNITY): Payer: Self-pay | Admitting: Orthopedic Surgery

## 2014-06-07 ENCOUNTER — Encounter (HOSPITAL_COMMUNITY): Payer: Self-pay | Admitting: *Deleted

## 2014-06-07 DIAGNOSIS — Z96641 Presence of right artificial hip joint: Secondary | ICD-10-CM | POA: Diagnosis not present

## 2014-06-07 DIAGNOSIS — M1611 Unilateral primary osteoarthritis, right hip: Secondary | ICD-10-CM | POA: Diagnosis not present

## 2014-06-07 NOTE — Progress Notes (Signed)
   06/07/14 2032  OBSTRUCTIVE SLEEP APNEA  Have you ever been diagnosed with sleep apnea through a sleep study? No  Do you snore loudly (loud enough to be heard through closed doors)?  1  Do you often feel tired, fatigued, or sleepy during the daytime? 1  Has anyone observed you stop breathing during your sleep? 0  Do you have, or are you being treated for high blood pressure? 1  BMI more than 35 kg/m2? 1  Age over 61 years old? 1  Gender: 0  Obstructive Sleep Apnea Score 5  Score 4 or greater  Results sent to PCP

## 2014-06-08 ENCOUNTER — Encounter (HOSPITAL_COMMUNITY)
Admission: RE | Disposition: A | Discharge status: Skilled Nursing Facility | Payer: Self-pay | Source: Ambulatory Visit | Attending: Orthopedic Surgery

## 2014-06-08 ENCOUNTER — Encounter (HOSPITAL_COMMUNITY): Payer: Self-pay | Admitting: Certified Registered"

## 2014-06-08 ENCOUNTER — Inpatient Hospital Stay (HOSPITAL_COMMUNITY): Payer: Medicare Other | Admitting: Anesthesiology

## 2014-06-08 ENCOUNTER — Inpatient Hospital Stay (HOSPITAL_COMMUNITY)
Admission: RE | Admit: 2014-06-08 | Discharge: 2014-06-12 | DRG: 467 | Disposition: A | Discharge status: Skilled Nursing Facility | Payer: Medicare Other | Source: Ambulatory Visit | Attending: Orthopedic Surgery | Admitting: Orthopedic Surgery

## 2014-06-08 DIAGNOSIS — R569 Unspecified convulsions: Secondary | ICD-10-CM | POA: Diagnosis present

## 2014-06-08 DIAGNOSIS — Z96641 Presence of right artificial hip joint: Secondary | ICD-10-CM | POA: Diagnosis not present

## 2014-06-08 DIAGNOSIS — S73004A Unspecified dislocation of right hip, initial encounter: Secondary | ICD-10-CM | POA: Diagnosis not present

## 2014-06-08 DIAGNOSIS — M6281 Muscle weakness (generalized): Secondary | ICD-10-CM | POA: Diagnosis not present

## 2014-06-08 DIAGNOSIS — Z880 Allergy status to penicillin: Secondary | ICD-10-CM | POA: Diagnosis not present

## 2014-06-08 DIAGNOSIS — I10 Essential (primary) hypertension: Secondary | ICD-10-CM | POA: Diagnosis present

## 2014-06-08 DIAGNOSIS — R1312 Dysphagia, oropharyngeal phase: Secondary | ICD-10-CM | POA: Diagnosis not present

## 2014-06-08 DIAGNOSIS — Z882 Allergy status to sulfonamides status: Secondary | ICD-10-CM | POA: Diagnosis not present

## 2014-06-08 DIAGNOSIS — M25551 Pain in right hip: Secondary | ICD-10-CM | POA: Diagnosis not present

## 2014-06-08 DIAGNOSIS — E119 Type 2 diabetes mellitus without complications: Secondary | ICD-10-CM | POA: Diagnosis not present

## 2014-06-08 DIAGNOSIS — R2681 Unsteadiness on feet: Secondary | ICD-10-CM | POA: Diagnosis not present

## 2014-06-08 DIAGNOSIS — T8451XA Infection and inflammatory reaction due to internal right hip prosthesis, initial encounter: Secondary | ICD-10-CM | POA: Diagnosis not present

## 2014-06-08 DIAGNOSIS — Z452 Encounter for adjustment and management of vascular access device: Secondary | ICD-10-CM | POA: Diagnosis not present

## 2014-06-08 DIAGNOSIS — T8451XS Infection and inflammatory reaction due to internal right hip prosthesis, sequela: Secondary | ICD-10-CM | POA: Diagnosis not present

## 2014-06-08 DIAGNOSIS — R0602 Shortness of breath: Secondary | ICD-10-CM | POA: Diagnosis not present

## 2014-06-08 DIAGNOSIS — M869 Osteomyelitis, unspecified: Secondary | ICD-10-CM | POA: Diagnosis present

## 2014-06-08 DIAGNOSIS — T8452XA Infection and inflammatory reaction due to internal left hip prosthesis, initial encounter: Secondary | ICD-10-CM | POA: Diagnosis not present

## 2014-06-08 DIAGNOSIS — Z471 Aftercare following joint replacement surgery: Secondary | ICD-10-CM | POA: Diagnosis not present

## 2014-06-08 DIAGNOSIS — Z96642 Presence of left artificial hip joint: Secondary | ICD-10-CM | POA: Diagnosis not present

## 2014-06-08 DIAGNOSIS — Z794 Long term (current) use of insulin: Secondary | ICD-10-CM | POA: Diagnosis not present

## 2014-06-08 DIAGNOSIS — M1990 Unspecified osteoarthritis, unspecified site: Secondary | ICD-10-CM | POA: Diagnosis not present

## 2014-06-08 DIAGNOSIS — Z96649 Presence of unspecified artificial hip joint: Secondary | ICD-10-CM

## 2014-06-08 DIAGNOSIS — Z7982 Long term (current) use of aspirin: Secondary | ICD-10-CM | POA: Diagnosis not present

## 2014-06-08 HISTORY — DX: Essential (primary) hypertension: I10

## 2014-06-08 HISTORY — DX: Nausea with vomiting, unspecified: R11.2

## 2014-06-08 HISTORY — DX: Cardiac murmur, unspecified: R01.1

## 2014-06-08 HISTORY — PX: INCISION AND DRAINAGE HIP: SHX1801

## 2014-06-08 HISTORY — DX: Adverse effect of unspecified anesthetic, initial encounter: T41.45XA

## 2014-06-08 HISTORY — DX: Other specified postprocedural states: Z98.890

## 2014-06-08 HISTORY — DX: Other complications of anesthesia, initial encounter: T88.59XA

## 2014-06-08 HISTORY — DX: Unspecified osteoarthritis, unspecified site: M19.90

## 2014-06-08 HISTORY — DX: Unspecified convulsions: R56.9

## 2014-06-08 HISTORY — DX: Reserved for inherently not codable concepts without codable children: IMO0001

## 2014-06-08 LAB — COMPREHENSIVE METABOLIC PANEL
ALBUMIN: 3.9 g/dL (ref 3.5–5.2)
ALT: 73 U/L — ABNORMAL HIGH (ref 0–35)
ANION GAP: 19 — AB (ref 5–15)
AST: 64 U/L — ABNORMAL HIGH (ref 0–37)
Alkaline Phosphatase: 106 U/L (ref 39–117)
BUN: 20 mg/dL (ref 6–23)
CO2: 22 mEq/L (ref 19–32)
CREATININE: 0.85 mg/dL (ref 0.50–1.10)
Calcium: 9.9 mg/dL (ref 8.4–10.5)
Chloride: 97 mEq/L (ref 96–112)
GFR calc Af Amer: 85 mL/min — ABNORMAL LOW (ref >90)
GFR calc non Af Amer: 73 mL/min — ABNORMAL LOW (ref >90)
Glucose, Bld: 150 mg/dL — ABNORMAL HIGH (ref 70–99)
Potassium: 4.1 mEq/L (ref 3.7–5.3)
Sodium: 138 mEq/L (ref 137–147)
TOTAL PROTEIN: 8.5 g/dL — AB (ref 6.0–8.3)
Total Bilirubin: 0.6 mg/dL (ref 0.3–1.2)

## 2014-06-08 LAB — PREPARE RBC (CROSSMATCH)

## 2014-06-08 LAB — CBC
HEMATOCRIT: 37.1 % (ref 36.0–46.0)
Hemoglobin: 11.8 g/dL — ABNORMAL LOW (ref 12.0–15.0)
MCH: 25.8 pg — ABNORMAL LOW (ref 26.0–34.0)
MCHC: 31.8 g/dL (ref 30.0–36.0)
MCV: 81 fL (ref 78.0–100.0)
Platelets: 289 10*3/uL (ref 150–400)
RBC: 4.58 MIL/uL (ref 3.87–5.11)
RDW: 16 % — AB (ref 11.5–15.5)
WBC: 11 10*3/uL — ABNORMAL HIGH (ref 4.0–10.5)

## 2014-06-08 LAB — APTT: aPTT: 30 seconds (ref 24–37)

## 2014-06-08 LAB — ABO/RH: ABO/RH(D): A POS

## 2014-06-08 LAB — PROTIME-INR
INR: 1.03 (ref 0.00–1.49)
Prothrombin Time: 13.7 seconds (ref 11.6–15.2)

## 2014-06-08 LAB — GLUCOSE, CAPILLARY
GLUCOSE-CAPILLARY: 142 mg/dL — AB (ref 70–99)
GLUCOSE-CAPILLARY: 199 mg/dL — AB (ref 70–99)
GLUCOSE-CAPILLARY: 154 mg/dL — AB (ref 70–99)

## 2014-06-08 LAB — SEDIMENTATION RATE: SED RATE: 82 mm/h — AB (ref 0–22)

## 2014-06-08 LAB — C-REACTIVE PROTEIN: CRP: 1.1 mg/dL — ABNORMAL HIGH (ref <0.60)

## 2014-06-08 SURGERY — IRRIGATION AND DEBRIDEMENT HIP
Anesthesia: General | Site: Hip | Laterality: Right

## 2014-06-08 MED ORDER — ALUM & MAG HYDROXIDE-SIMETH 200-200-20 MG/5ML PO SUSP: 30.0000 mL | ORAL | Status: DC | PRN

## 2014-06-08 MED ORDER — GLYCOPYRROLATE 0.2 MG/ML IJ SOLN
INTRAMUSCULAR | Status: DC | PRN
  Administered 2014-06-08: 0.6 mg via INTRAVENOUS

## 2014-06-08 MED ORDER — METOCLOPRAMIDE HCL 5 MG/ML IJ SOLN: 5.0000 mg | Freq: Three times a day (TID) | INTRAMUSCULAR | Status: DC | PRN

## 2014-06-08 MED ORDER — ONDANSETRON HCL 4 MG/2ML IJ SOLN: 4.0000 mg | Freq: Four times a day (QID) | INTRAMUSCULAR | Status: DC | PRN

## 2014-06-08 MED ORDER — MIDAZOLAM HCL 5 MG/5ML IJ SOLN
INTRAMUSCULAR | Status: DC | PRN
  Administered 2014-06-08: 2 mg via INTRAVENOUS

## 2014-06-08 MED ORDER — VANCOMYCIN HCL IN DEXTROSE 1-5 GM/200ML-% IV SOLN
1000.0000 mg | Freq: Two times a day (BID) | INTRAVENOUS | Status: DC
  Administered 2014-06-08 – 2014-06-09 (×3): 1000 mg via INTRAVENOUS
  Filled 2014-06-08 (×4): qty 200

## 2014-06-08 MED ORDER — ONDANSETRON HCL 4 MG/2ML IJ SOLN
INTRAMUSCULAR | Status: AC
  Filled 2014-06-08: qty 2

## 2014-06-08 MED ORDER — PROMETHAZINE HCL 25 MG/ML IJ SOLN: 6.2500 mg | INTRAMUSCULAR | Status: DC | PRN

## 2014-06-08 MED ORDER — METHOCARBAMOL 500 MG PO TABS
500.0000 mg | ORAL_TABLET | Freq: Four times a day (QID) | ORAL | Status: DC | PRN
  Administered 2014-06-08 – 2014-06-10 (×4): 500 mg via ORAL
  Filled 2014-06-08 (×3): qty 1

## 2014-06-08 MED ORDER — NEOSTIGMINE METHYLSULFATE 10 MG/10ML IV SOLN
INTRAVENOUS | Status: DC | PRN
  Administered 2014-06-08: 4 mg via INTRAVENOUS

## 2014-06-08 MED ORDER — LIDOCAINE HCL (CARDIAC) 20 MG/ML IV SOLN
INTRAVENOUS | Status: DC | PRN
  Administered 2014-06-08: 60 mg via INTRAVENOUS

## 2014-06-08 MED ORDER — DIPHENHYDRAMINE HCL 12.5 MG/5ML PO ELIX: 12.5000 mg | ORAL_SOLUTION | ORAL | Status: DC | PRN

## 2014-06-08 MED ORDER — LIDOCAINE HCL (CARDIAC) 20 MG/ML IV SOLN
INTRAVENOUS | Status: AC
  Filled 2014-06-08: qty 5

## 2014-06-08 MED ORDER — GENTAMICIN SULFATE 40 MG/ML IJ SOLN
INTRAMUSCULAR | Status: DC | PRN
  Administered 2014-06-08: 120 mg via INTRAMUSCULAR

## 2014-06-08 MED ORDER — INSULIN DETEMIR 100 UNIT/ML ~~LOC~~ SOLN
10.0000 [IU] | Freq: Every day | SUBCUTANEOUS | Status: DC
  Administered 2014-06-08 – 2014-06-11 (×4): 10 [IU] via SUBCUTANEOUS
  Filled 2014-06-08 (×6): qty 0.1

## 2014-06-08 MED ORDER — MEPERIDINE HCL 25 MG/ML IJ SOLN: 6.2500 mg | INTRAMUSCULAR | Status: DC | PRN

## 2014-06-08 MED ORDER — ACETAMINOPHEN 325 MG PO TABS
650.0000 mg | ORAL_TABLET | Freq: Four times a day (QID) | ORAL | Status: DC | PRN
Start: 2014-06-08 — End: 2014-06-12

## 2014-06-08 MED ORDER — DEXTROSE 5 % IV SOLN: 500.0000 mg | Freq: Four times a day (QID) | INTRAVENOUS | Status: DC | PRN

## 2014-06-08 MED ORDER — ROCURONIUM BROMIDE 100 MG/10ML IV SOLN
INTRAVENOUS | Status: DC | PRN
  Administered 2014-06-08: 50 mg via INTRAVENOUS

## 2014-06-08 MED ORDER — PROPOFOL 10 MG/ML IV BOLUS
INTRAVENOUS | Status: AC
  Filled 2014-06-08: qty 20

## 2014-06-08 MED ORDER — SCOPOLAMINE 1 MG/3DAYS TD PT72
MEDICATED_PATCH | TRANSDERMAL | Status: AC
  Filled 2014-06-08: qty 1

## 2014-06-08 MED ORDER — FENTANYL CITRATE 0.05 MG/ML IJ SOLN
INTRAMUSCULAR | Status: AC
  Filled 2014-06-08: qty 5

## 2014-06-08 MED ORDER — OXYCODONE HCL 5 MG PO TABS
5.0000 mg | ORAL_TABLET | ORAL | Status: DC | PRN
  Administered 2014-06-08: 10 mg via ORAL
  Administered 2014-06-09: 5 mg via ORAL
  Administered 2014-06-09 – 2014-06-11 (×10): 10 mg via ORAL
  Filled 2014-06-08 (×10): qty 2

## 2014-06-08 MED ORDER — ALBUMIN HUMAN 5 % IV SOLN
INTRAVENOUS | Status: AC
  Filled 2014-06-08: qty 250

## 2014-06-08 MED ORDER — FENTANYL CITRATE 0.05 MG/ML IJ SOLN
INTRAMUSCULAR | Status: DC | PRN
  Administered 2014-06-08: 50 ug via INTRAVENOUS
  Administered 2014-06-08: 150 ug via INTRAVENOUS
  Administered 2014-06-08: 50 ug via INTRAVENOUS

## 2014-06-08 MED ORDER — VANCOMYCIN HCL IN DEXTROSE 1-5 GM/200ML-% IV SOLN
1000.0000 mg | INTRAVENOUS | Status: AC
Start: 2014-06-09 — End: 2014-06-08
  Administered 2014-06-08: 1000 mg via INTRAVENOUS

## 2014-06-08 MED ORDER — ACETAMINOPHEN 650 MG RE SUPP: 650.0000 mg | Freq: Four times a day (QID) | RECTAL | Status: DC | PRN

## 2014-06-08 MED ORDER — SODIUM CHLORIDE 0.9 % IV SOLN: Freq: Once | INTRAVENOUS | Status: DC

## 2014-06-08 MED ORDER — ONDANSETRON HCL 4 MG/2ML IJ SOLN
INTRAMUSCULAR | Status: DC | PRN
Start: 2014-06-08 — End: 2014-06-08
  Administered 2014-06-08: 4 mg via INTRAVENOUS

## 2014-06-08 MED ORDER — SODIUM CHLORIDE 0.9 % IV SOLN
10.0000 mg | INTRAVENOUS | Status: DC | PRN
  Administered 2014-06-08: 25 ug/min via INTRAVENOUS

## 2014-06-08 MED ORDER — LACTATED RINGERS IV SOLN
INTRAVENOUS | Status: DC
  Administered 2014-06-08 (×4): via INTRAVENOUS

## 2014-06-08 MED ORDER — DIPHENHYDRAMINE HCL 50 MG/ML IJ SOLN
10.0000 mg | Freq: Once | INTRAMUSCULAR | Status: AC
  Administered 2014-06-08: 10 mg via INTRAVENOUS

## 2014-06-08 MED ORDER — PROPOFOL 10 MG/ML IV BOLUS
INTRAVENOUS | Status: DC | PRN
  Administered 2014-06-08: 120 mg via INTRAVENOUS

## 2014-06-08 MED ORDER — HYDROCHLOROTHIAZIDE 12.5 MG PO CAPS
12.5000 mg | ORAL_CAPSULE | Freq: Every day | ORAL | Status: DC
Start: 2014-06-08 — End: 2014-06-12
  Administered 2014-06-09: 12.5 mg via ORAL
  Filled 2014-06-08 (×5): qty 1

## 2014-06-08 MED ORDER — VANCOMYCIN HCL 500 MG IV SOLR
INTRAVENOUS | Status: DC | PRN
  Administered 2014-06-08: 1000 mg

## 2014-06-08 MED ORDER — ALBUMIN HUMAN 5 % IV SOLN
12.5000 g | Freq: Once | INTRAVENOUS | Status: AC
  Administered 2014-06-08: 12.5 g via INTRAVENOUS

## 2014-06-08 MED ORDER — ROCURONIUM BROMIDE 50 MG/5ML IV SOLN
INTRAVENOUS | Status: AC
  Filled 2014-06-08: qty 1

## 2014-06-08 MED ORDER — ALBUMIN HUMAN 5 % IV SOLN
INTRAVENOUS | Status: DC | PRN
  Administered 2014-06-08: 18:00:00 via INTRAVENOUS

## 2014-06-08 MED ORDER — ASPIRIN EC 325 MG PO TBEC
325.0000 mg | DELAYED_RELEASE_TABLET | Freq: Every day | ORAL | Status: DC
  Administered 2014-06-09 – 2014-06-12 (×4): 325 mg via ORAL
  Filled 2014-06-08 (×5): qty 1

## 2014-06-08 MED ORDER — LISINOPRIL-HYDROCHLOROTHIAZIDE 20-12.5 MG PO TABS: 1.0000 | ORAL_TABLET | Freq: Every day | ORAL | Status: DC

## 2014-06-08 MED ORDER — SODIUM CHLORIDE 0.9 % IR SOLN
Status: DC | PRN
  Administered 2014-06-08 (×3): 1000 mL

## 2014-06-08 MED ORDER — PHENOL 1.4 % MT LIQD: 1.0000 | OROMUCOSAL | Status: DC | PRN

## 2014-06-08 MED ORDER — MIDAZOLAM HCL 2 MG/2ML IJ SOLN
INTRAMUSCULAR | Status: AC
  Filled 2014-06-08: qty 2

## 2014-06-08 MED ORDER — REPAGLINIDE 1 MG PO TABS
1.0000 mg | ORAL_TABLET | Freq: Three times a day (TID) | ORAL | Status: DC
  Filled 2014-06-08 (×14): qty 1

## 2014-06-08 MED ORDER — METOCLOPRAMIDE HCL 10 MG PO TABS: 5.0000 mg | ORAL_TABLET | Freq: Three times a day (TID) | ORAL | Status: DC | PRN

## 2014-06-08 MED ORDER — FENTANYL CITRATE 0.05 MG/ML IJ SOLN
25.0000 ug | INTRAMUSCULAR | Status: DC | PRN
  Administered 2014-06-08: 50 ug via INTRAVENOUS

## 2014-06-08 MED ORDER — LISINOPRIL 20 MG PO TABS
20.0000 mg | ORAL_TABLET | Freq: Every day | ORAL | Status: DC
  Filled 2014-06-08 (×5): qty 1

## 2014-06-08 MED ORDER — INSULIN ASPART 100 UNIT/ML ~~LOC~~ SOLN
4.0000 [IU] | Freq: Three times a day (TID) | SUBCUTANEOUS | Status: DC
  Administered 2014-06-09 – 2014-06-12 (×11): 4 [IU] via SUBCUTANEOUS

## 2014-06-08 MED ORDER — SODIUM CHLORIDE 0.9 % IV SOLN
INTRAVENOUS | Status: DC
  Administered 2014-06-09: 21:00:00 via INTRAVENOUS

## 2014-06-08 MED ORDER — HYDROMORPHONE HCL 1 MG/ML IJ SOLN
1.0000 mg | INTRAMUSCULAR | Status: DC | PRN
  Administered 2014-06-09: 1 mg via INTRAVENOUS

## 2014-06-08 MED ORDER — SCOPOLAMINE 1 MG/3DAYS TD PT72
1.0000 | MEDICATED_PATCH | TRANSDERMAL | Status: DC
  Administered 2014-06-08: 1.5 mg via TRANSDERMAL

## 2014-06-08 MED ORDER — FERROUS SULFATE 325 (65 FE) MG PO TABS
325.0000 mg | ORAL_TABLET | Freq: Three times a day (TID) | ORAL | Status: DC
Start: 2014-06-09 — End: 2014-06-12
  Administered 2014-06-09 – 2014-06-12 (×11): 325 mg via ORAL
  Filled 2014-06-08 (×13): qty 1

## 2014-06-08 MED ORDER — GLYCOPYRROLATE 0.2 MG/ML IJ SOLN
INTRAMUSCULAR | Status: AC
  Filled 2014-06-08: qty 3

## 2014-06-08 MED ORDER — NEOSTIGMINE METHYLSULFATE 10 MG/10ML IV SOLN
INTRAVENOUS | Status: AC
  Filled 2014-06-08: qty 1

## 2014-06-08 MED ORDER — FENTANYL CITRATE 0.05 MG/ML IJ SOLN
INTRAMUSCULAR | Status: AC
  Filled 2014-06-08: qty 2

## 2014-06-08 MED ORDER — ONDANSETRON HCL 4 MG PO TABS
4.0000 mg | ORAL_TABLET | Freq: Four times a day (QID) | ORAL | Status: DC | PRN
  Administered 2014-06-12: 4 mg via ORAL
  Filled 2014-06-08: qty 1

## 2014-06-08 MED ORDER — MENTHOL 3 MG MT LOZG: 1.0000 | LOZENGE | OROMUCOSAL | Status: DC | PRN

## 2014-06-08 SURGICAL SUPPLY — 67 items
BANDAGE ELASTIC 4 VELCRO ST LF (GAUZE/BANDAGES/DRESSINGS) IMPLANT
BANDAGE ELASTIC 6 VELCRO ST LF (GAUZE/BANDAGES/DRESSINGS) IMPLANT
BANDAGE ESMARK 6X9 LF (GAUZE/BANDAGES/DRESSINGS) IMPLANT
BNDG COHESIVE 4X5 TAN STRL (GAUZE/BANDAGES/DRESSINGS) IMPLANT
BNDG ESMARK 6X9 LF (GAUZE/BANDAGES/DRESSINGS)
BNDG GAUZE ELAST 4 BULKY (GAUZE/BANDAGES/DRESSINGS) ×4 IMPLANT
CONT SPECI 4OZ STER CLIK (MISCELLANEOUS) ×4 IMPLANT
COVER SURGICAL LIGHT HANDLE (MISCELLANEOUS) ×4 IMPLANT
CUFF TOURNIQUET SINGLE 34IN LL (TOURNIQUET CUFF) IMPLANT
CUFF TOURNIQUET SINGLE 44IN (TOURNIQUET CUFF) IMPLANT
DRAPE C-ARM 42X72 X-RAY (DRAPES) IMPLANT
DRAPE IMP U-DRAPE 54X76 (DRAPES) ×4 IMPLANT
DRAPE INCISE IOBAN 66X45 STRL (DRAPES) IMPLANT
DRAPE ORTHO SPLIT 77X108 STRL (DRAPES) ×4
DRAPE SURG ORHT 6 SPLT 77X108 (DRAPES) ×4 IMPLANT
DRAPE U-SHAPE 47X51 STRL (DRAPES) ×4 IMPLANT
DRSG ADAPTIC 3X8 NADH LF (GAUZE/BANDAGES/DRESSINGS) ×4 IMPLANT
DRSG EMULSION OIL 3X3 NADH (GAUZE/BANDAGES/DRESSINGS) ×4 IMPLANT
DRSG MEPILEX BORDER 4X4 (GAUZE/BANDAGES/DRESSINGS) ×4 IMPLANT
DRSG MEPILEX BORDER 4X8 (GAUZE/BANDAGES/DRESSINGS) ×4 IMPLANT
DRSG PAD ABDOMINAL 8X10 ST (GAUZE/BANDAGES/DRESSINGS) ×4 IMPLANT
DURAPREP 26ML APPLICATOR (WOUND CARE) ×4 IMPLANT
ELECT CAUTERY BLADE 6.4 (BLADE) IMPLANT
ELECT REM PT RETURN 9FT ADLT (ELECTROSURGICAL) ×4
ELECTRODE REM PT RTRN 9FT ADLT (ELECTROSURGICAL) ×2 IMPLANT
FEMORAL HEAD (Orthopedic Implant) ×4 IMPLANT
GAUZE SPONGE 4X4 12PLY STRL (GAUZE/BANDAGES/DRESSINGS) ×4 IMPLANT
GLOVE BIOGEL PI IND STRL 9 (GLOVE) ×2 IMPLANT
GLOVE BIOGEL PI INDICATOR 9 (GLOVE) ×2
GLOVE SURG ORTHO 9.0 STRL STRW (GLOVE) ×4 IMPLANT
GOWN STRL REUS W/ TWL XL LVL3 (GOWN DISPOSABLE) ×6 IMPLANT
GOWN STRL REUS W/TWL XL LVL3 (GOWN DISPOSABLE) ×6
HANDPIECE INTERPULSE COAX TIP (DISPOSABLE) ×2
IMMOBILIZER KNEE 20 (SOFTGOODS) ×4
IMMOBILIZER KNEE 20 THIGH 36 (SOFTGOODS) ×2 IMPLANT
KIT BASIN OR (CUSTOM PROCEDURE TRAY) ×4 IMPLANT
KIT ROOM TURNOVER OR (KITS) ×4 IMPLANT
KIT STIMULAN RAPID CURE  10CC (Orthopedic Implant) ×2 IMPLANT
KIT STIMULAN RAPID CURE 10CC (Orthopedic Implant) ×2 IMPLANT
LINER ACETAB ELEV  GG 32 X 48 (Orthopedic Implant) ×4 IMPLANT
MANIFOLD NEPTUNE II (INSTRUMENTS) ×4 IMPLANT
NS IRRIG 1000ML POUR BTL (IV SOLUTION) ×4 IMPLANT
PACK ORTHO EXTREMITY (CUSTOM PROCEDURE TRAY) ×4 IMPLANT
PACK TOTAL JOINT (CUSTOM PROCEDURE TRAY) ×4 IMPLANT
PACK UNIVERSAL I (CUSTOM PROCEDURE TRAY) ×4 IMPLANT
PAD ARMBOARD 7.5X6 YLW CONV (MISCELLANEOUS) ×8 IMPLANT
PAD CAST 4YDX4 CTTN HI CHSV (CAST SUPPLIES) ×2 IMPLANT
PADDING CAST COTTON 4X4 STRL (CAST SUPPLIES) ×2
SET HNDPC FAN SPRY TIP SCT (DISPOSABLE) ×2 IMPLANT
SPONGE LAP 18X18 X RAY DECT (DISPOSABLE) ×4 IMPLANT
STAPLER VISISTAT 35W (STAPLE) ×8 IMPLANT
STOCKINETTE IMPERVIOUS 9X36 MD (GAUZE/BANDAGES/DRESSINGS) IMPLANT
SUT ETHIBOND NAB CT1 #1 30IN (SUTURE) ×8 IMPLANT
SUT ETHILON 2 0 PSLX (SUTURE) IMPLANT
SUT VIC AB 0 CT1 27 (SUTURE) ×2
SUT VIC AB 0 CT1 27XBRD ANBCTR (SUTURE) ×2 IMPLANT
SUT VIC AB 1 CTX 36 (SUTURE) ×2
SUT VIC AB 1 CTX36XBRD ANBCTR (SUTURE) ×2 IMPLANT
SUT VIC AB 2-0 CT1 27 (SUTURE)
SUT VIC AB 2-0 CT1 TAPERPNT 27 (SUTURE) IMPLANT
SWAB CULTURE LIQUID MINI MALE (MISCELLANEOUS) ×4 IMPLANT
TAPE CLOTH SURG 4X10 WHT LF (GAUZE/BANDAGES/DRESSINGS) ×4 IMPLANT
TOWEL OR 17X24 6PK STRL BLUE (TOWEL DISPOSABLE) ×4 IMPLANT
TOWEL OR 17X26 10 PK STRL BLUE (TOWEL DISPOSABLE) ×4 IMPLANT
TUBE ANAEROBIC SPECIMEN COL (MISCELLANEOUS) ×4 IMPLANT
UNDERPAD 30X30 INCONTINENT (UNDERPADS AND DIAPERS) ×4 IMPLANT
WATER STERILE IRR 1000ML POUR (IV SOLUTION) ×4 IMPLANT

## 2014-06-08 NOTE — Transfer of Care (Signed)
Immediate Anesthesia Transfer of Care Note  Patient: Kelly Richardson  Procedure(s) Performed: Procedure(s): Irrigation and Debridement Right Hip, Revision of Cup and Head of Total Hip with  Placement  of Antibiotic Spacer (Right)  Patient Location: PACU  Anesthesia Type:General  Level of Consciousness: awake and alert   Airway & Oxygen Therapy: Patient Spontanous Breathing and Patient connected to nasal cannula oxygen  Post-op Assessment: Report given to PACU RN and Post -op Vital signs reviewed and stable  Post vital signs: Reviewed and stable  Complications: No apparent anesthesia complications

## 2014-06-08 NOTE — H&P (Signed)
Kelly Richardson is an 63 y.o. female.   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 Patient states that she has had a purulent drainage from the right hip for the past 6 months which has been treated by her physician with oral antibiotics. Dr. Jarold MottoPatterson notified me 2 days ago the patient had a CT scan which showed possible abscess of the right hip. Patient was called multiple times  she was seen urgently in the office yesterday and scheduled for emergent surgery today for irrigation and debridement of the deep abscess and revision of the total hip as necessary.  Past Medical History  Diagnosis Date  . Diabetes mellitus without complication   . Complication of anesthesia   . PONV (postoperative nausea and vomiting)   . Hypertension   . Heart murmur     as a child  . 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 Shortness of breath dyspnea   . Arthritis     Past Surgical History  Procedure Laterality Date  . Total hip arthroplasty Right 2011  . Abdominal hysterectomy    . Cholecystectomy  1995  . Tonsillectomy      History reviewed. No pertinent family history. Social History:  reports that she has never smoked. She does not have any smokeless tobacco history on file. She reports that she does not drink alcohol or use illicit drugs.  Allergies:  Allergies  Allergen Reactions  . Penicillins Anaphylaxis  . Seldane [Terfenadine] Other (See Comments)    N/Richardson, increased heart rate, seizures, hallinations.  . Sulfonamide Derivatives Anaphylaxis  . Other Nausea And Vomiting    "Anything I have ever been given more nausea just increases it."  . Iohexol      Desc: pt states she had a ct in [place] in 2000 and immediately experienced profuse vomiting and sweating.  She said they treated her like she was having a reaction and doesn't remember much  else or if meds were given., Onset Date: [id number]   . Latex Other (See Comments)    BLISTERING  . Metformin Other (See Comments)    REACTION: faintness, DIZZINESS ,    No prescriptions prior to admission    No results found for this or any previous visit (from the past 48 hour(s)). No results found.  Review of Systems  All other systems reviewed and are negative.   Height 5\' 1"  (1.549 m), weight 99.791 kg (220 lb). Physical Exam  On examination patient has purulent drainage from her right hip inferior aspect of the incision. CT scan does not show any destructive bony changes but does show a very large abscess. 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  Plan: Plan for revision of total hip arthroplasty as needed plan for irrigation and debridement placement of antibiotic beads. We will get infectious disease consult. Patient will need a PICC line for long-term antibiotics.  Kelly Richardson 06/08/2014, 6:42 AM

## 2014-06-08 NOTE — Op Note (Signed)
06/08/2014  6:41 PM  PATIENT:  Kelly Richardson    PRE-OPERATIVE DIAGNOSIS:  Infected Right Total Hip Arthroplasty  POST-OPERATIVE DIAGNOSIS:  Same  PROCEDURE:  Irrigation and Debridement Right Hip, Revision of Cup and Head of Total Hip with  Placement  of Antibiotic Beads with 1 g vancomycin and 240 mg gentamicin  SURGEON:  Nadara MustardUDA,MARCUS V, MD  PHYSICIAN ASSISTANT:None ANESTHESIA:   General  PREOPERATIVE INDICATIONS:  Kelly Richardson is a  63 y.o. female with a diagnosis of Infected Right Total Hip Arthroplasty who failed conservative measures and elected for surgical management.    The risks benefits and alternatives were discussed with the patient preoperatively including but not limited to the risks of infection, bleeding, nerve injury, cardiopulmonary complications, the need for revision surgery, among others, and the patient was willing to proceed.  OPERATIVE IMPLANTS: Zimmer implants. 48 mm acetabular liner. 36 mm metal ball.  OPERATIVE FINDINGS: No purulence however there was a well epithelialized sinus tract that went down to the joint space. There was fatty-like tissue around the joint. Cultures were obtained 2.  OPERATIVE PROCEDURE: Patient was brought to the operating room and underwent a general anesthetic. After adequate levels of anesthesia were obtained patient was placed in the left lateral decubitus position with the right side up and the right lower extremity was prepped using DuraPrep draped into a sterile field an [place] was used to cover all exposed skin. A timeout was called. Her previous incision was used a posterior lateral incision was carried down through the tensor fascia lata which was split. This was carried down along the bone to minimize risk to the sciatic nerve and this was carried down along the new capsule to the joint. There was fatty-like tissue within the joint and this was extensively debrided. Patient had cultures obtained from this area. Her sinus  tract was also excised. The wound was irrigated with pulsatile lavage. The head and acetabular liner were removed. The femoral stem and acetabular metal cup were well fixed and secure. The hip was irrigated with normal saline and extensive debridement was performed. The new polyethylene liner and new ball were replaced and the hip was reduced. The hip was placed through a range of motion and was stable. Antibiotic beads were placed around the total joint arthroplasty. Beads were mixed with 1 g vancomycin and 240 mg gentamicin. The tensor fascia lata was closed using #1 Vicryl subcutaneous was closed using 0 Vicryl. Skin was closed using staples. Mepilex dressing was applied. Patient was extubated taken to the PACU in stable condition.

## 2014-06-08 NOTE — Anesthesia Preprocedure Evaluation (Addendum)
Anesthesia Evaluation  Patient identified by MRN, date of birth, ID band Patient awake    Reviewed: Allergy & Precautions, NPO status , Patient's Chart, lab work & pertinent test results  History of Anesthesia Complications (+) PONV and history of anesthetic complications  Airway Mallampati: II  TM Distance: >3 FB Neck ROM: Full    Dental  (+) Dental Advisory Given   Pulmonary neg pulmonary ROS,  breath sounds clear to auscultation        Cardiovascular hypertension, Pt. on medications - anginaRhythm:Regular Rate:Normal     Neuro/Psych Anxiety negative neurological ROS     GI/Hepatic GERD-  Medicated and Controlled,Elevated LFTs   Endo/Other  diabetes (glu 142), Type 2, Insulin DependentMorbid obesity  Renal/GU      Musculoskeletal   Abdominal (+) + obese,   Peds  Hematology   Anesthesia Other Findings   Reproductive/Obstetrics                           Anesthesia Physical Anesthesia Plan  ASA: III  Anesthesia Plan: General   Post-op Pain Management:    Induction: Intravenous  Airway Management Planned: Oral ETT  Additional Equipment:   Intra-op Plan:   Post-operative Plan: Extubation in OR  Informed Consent: I have reviewed the patients History and Physical, chart, labs and discussed the procedure including the risks, benefits and alternatives for the proposed anesthesia with the patient or authorized representative who has indicated his/her understanding and acceptance.   Dental advisory given  Plan Discussed with: CRNA and Surgeon  Anesthesia Plan Comments: (Plan routine monitors, GETA)       Anesthesia Quick Evaluation

## 2014-06-09 LAB — CBC
HCT: 25.5 % — ABNORMAL LOW (ref 36.0–46.0)
Hemoglobin: 8.4 g/dL — ABNORMAL LOW (ref 12.0–15.0)
MCH: 26.9 pg (ref 26.0–34.0)
MCHC: 32.9 g/dL (ref 30.0–36.0)
MCV: 81.7 fL (ref 78.0–100.0)
PLATELETS: 270 10*3/uL (ref 150–400)
RBC: 3.12 MIL/uL — AB (ref 3.87–5.11)
RDW: 15.9 % — ABNORMAL HIGH (ref 11.5–15.5)
WBC: 12 10*3/uL — ABNORMAL HIGH (ref 4.0–10.5)

## 2014-06-09 LAB — BASIC METABOLIC PANEL
ANION GAP: 12 (ref 5–15)
BUN: 12 mg/dL (ref 6–23)
CALCIUM: 8.8 mg/dL (ref 8.4–10.5)
CO2: 24 mEq/L (ref 19–32)
Chloride: 98 mEq/L (ref 96–112)
Creatinine, Ser: 0.73 mg/dL (ref 0.50–1.10)
Glucose, Bld: 140 mg/dL — ABNORMAL HIGH (ref 70–99)
Potassium: 5.2 mEq/L (ref 3.7–5.3)
SODIUM: 134 meq/L — AB (ref 137–147)

## 2014-06-09 LAB — GLUCOSE, CAPILLARY
Glucose-Capillary: 173 mg/dL — ABNORMAL HIGH (ref 70–99)
Glucose-Capillary: 203 mg/dL — ABNORMAL HIGH (ref 70–99)
Glucose-Capillary: 177 mg/dL — ABNORMAL HIGH (ref 70–99)
Glucose-Capillary: 185 mg/dL — ABNORMAL HIGH (ref 70–99)

## 2014-06-09 LAB — HEMOGLOBIN A1C
HEMOGLOBIN A1C: 7.2 % — AB (ref <5.7)
Mean Plasma Glucose: 160 mg/dL — ABNORMAL HIGH (ref <117)

## 2014-06-09 NOTE — Progress Notes (Signed)
Pt BP 70/51, as pt progressively woke up pts BP came up to 86/43. Pt is A&Ox4, follows commands, answers all questions, & stays awake until questions subside which is when she dozes back to sleep but is easily aroused. Pt also recently received 1 mg dilaudid for a pain of 10/10. Rapid response RN on unit for unrelated reason. Rapid response informed of pts status & is advised to monitor pt closely. @0430  pts BP 91/43. Nursing will continue to monitor.

## 2014-06-09 NOTE — Plan of Care (Signed)
Problem: Phase I Progression Outcomes Goal: Sutures/staples intact Outcome: Completed/Met Date Met:  06/09/14     

## 2014-06-09 NOTE — Plan of Care (Signed)
Problem: Phase I Progression Outcomes Goal: Pain controlled with appropriate interventions Outcome: Completed/Met Date Met:  06/09/14 Goal: OOB as tolerated unless otherwise ordered Outcome: Completed/Met Date Met:  06/09/14 Goal: Initial discharge plan identified Outcome: Completed/Met Date Met:  06/09/14 Goal: Other Phase I Outcomes/Goals Outcome: Not Applicable Date Met:  01/60/10

## 2014-06-09 NOTE — Evaluation (Signed)
Physical Therapy Evaluation Patient Details Name: Kelly Richardson MRN: [id number] DOB: 1951-05-18 Today's Date: 06/09/2014   History of Present Illness  63 y.o. female with a diagnosis of Infected Right Total Hip Arthroplasty; pt s/p Rt THA revision.   Clinical Impression  Pt is s/p Rt THA revision resulting in the deficits listed below (see PT Problem List). Pt will benefit from skilled PT to increase their independence and safety with mobility to allow discharge to the venue listed below. Pt requiring (A) at this time and does not have adequate (A) post acute. Pt to benefit from SNF upon acute D/C for acute rehab.      Follow Up Recommendations SNF;Supervision/Assistance - 24 hour    Equipment Recommendations  None recommended by PT    Recommendations for Other Services       Precautions / Restrictions Precautions Precautions: Posterior Hip;Fall Precaution Booklet Issued: Yes (comment) Precaution Comments: reviewed posterior precautions  Required Braces or Orthoses: Other Brace/Splint;Knee Immobilizer - Right Knee Immobilizer - Right: Other (comment) (in bed for posterior hip precautions) Other Brace/Splint: abduction pillow Restrictions Weight Bearing Restrictions: Yes RLE Weight Bearing: Weight bearing as tolerated      Mobility  Bed Mobility Overal bed mobility: Needs Assistance Bed Mobility: Supine to Sit     Supine to sit: Min assist;HOB elevated     General bed mobility comments: cues for sequencing, hand placement and hip precautions; incr time and (A) to advance Rt LE to/off EOB  Transfers Overall transfer level: Needs assistance Equipment used: Rolling walker (2 wheeled) Transfers: Sit to/from [employer]Stand;Stand Pivot Transfers Sit to Stand: Min assist Stand pivot transfers: Min assist       General transfer comment: cues for hand placement and hip precautions; (A) to maintain balance and manage RW with SPT  Ambulation/Gait             General Gait  Details: pivotal steps to chair only  Stairs            Wheelchair Mobility    Modified Rankin (Stroke Patients Only)       Balance Overall balance assessment: Needs assistance Sitting-balance support: Feet supported;No upper extremity supported Sitting balance-Leahy Scale: Fair Sitting balance - Comments: denied any dizziness   Standing balance support: During functional activity;Bilateral upper extremity supported Standing balance-Leahy Scale: Poor Standing balance comment: relies on RW for balance                             Pertinent Vitals/Pain Pain Assessment: 0-10 Pain Score: 6  Pain Location: Rt hip Pain Descriptors / Indicators: Aching Pain Intervention(s): Monitored during session;Premedicated before session;Repositioned    Home Living Family/patient expects to be discharged to:: Skilled nursing facility Living Arrangements: Spouse/significant other               Additional Comments: husband works at night and sleeps during the day    Prior Function Level of Independence: Independent with assistive device(s);Needs assistance   Gait / Transfers Assistance Needed: ambulating with RW for ~5 years  ADL's / Homemaking Assistance Needed: husband helps clean feet and back        Hand Dominance        Extremity/Trunk Assessment   Upper Extremity Assessment: Defer to OT evaluation           Lower Extremity Assessment: RLE deficits/detail      Cervical / Trunk Assessment: Normal  Communication   Communication: No difficulties  Cognition Arousal/Alertness: Awake/alert Behavior During Therapy: Anxious Overall Cognitive Status: Within Functional Limits for tasks assessed       Memory: Decreased recall of precautions              General Comments General comments (skin integrity, edema, etc.): drainage noted on Rt hip dressing; RN made aware    Exercises Total Joint Exercises Ankle Circles/Pumps:  AROM;Strengthening;10 reps;Supine Gluteal Sets: 10 reps      Assessment/Plan    PT Assessment Patient needs continued PT services  PT Diagnosis Difficulty walking;Generalized weakness;Acute pain   PT Problem List Decreased strength;Decreased range of motion;Decreased activity tolerance;Decreased balance;Decreased mobility;Decreased knowledge of use of DME;Decreased safety awareness;Decreased knowledge of precautions;Pain;Obesity  PT Treatment Interventions DME instruction;Gait training;Functional mobility training;Therapeutic activities;Therapeutic exercise;Balance training;Neuromuscular re-education;Patient/family education   PT Goals (Current goals can be found in the Care Plan section) Acute Rehab PT Goals Patient Stated Goal: to go get rehab then home PT Goal Formulation: With patient Time For Goal Achievement: 06/16/14 Potential to Achieve Goals: Good    Frequency 7X/week   Barriers to discharge Decreased caregiver support husband works at night and will sleep all day    Co-evaluation               End of Session Equipment Utilized During Treatment: Gait belt Activity Tolerance: Patient limited by pain Patient left: in chair;with call bell/phone within reach;with family/visitor present Nurse Communication: Mobility status;Precautions;Weight bearing status         Time: 1410-1426 PT Time Calculation (min) (ACUTE ONLY): 16 min   Charges:   PT Evaluation $Initial PT Evaluation Tier I: 1 Procedure PT Treatments $Therapeutic Activity: 8-22 mins   PT G CodesDonell Sievert:          Soul Hackman N, [place]  [phone] 06/09/2014, 3:46 PM

## 2014-06-09 NOTE — Progress Notes (Signed)
Spoke with PT. Regarding PICC placement.  Agreeable to have PICC and signed consent.  Prefers to have PICC placed in am due to having discomfort related to unable to urinate.  Will place PICC in am.

## 2014-06-09 NOTE — Consult Note (Signed)
Notified IV team that patient has yet to have PICC placement. Gave team my number for call back to update on a time estimate for placement.

## 2014-06-09 NOTE — Progress Notes (Addendum)
Patient ID: Kelly Richardson, female   DOB: September 25, 1950, 63 y.o.   MRN: [id number] Postoperative day 1 status post irrigation and debridement placement of antibiotic beads and changing out of the polyethylene liner and head for a right total hip arthroplasty. Cultures are pending. Will have a PICC line placed we'll obtain infectious disease consult on Monday. Patient moves her toes and ankle well.

## 2014-06-09 NOTE — Progress Notes (Addendum)
Pt BP 86/35, MD on call paged. Dr. Lajoyce Cornersuda returned phone call. Gave RN new orders for pt to receive a 500 cc bolus now & to increase her fluid volume to 20 cc/hr to 75 cc/hr. Orders carried out. Nursing will continue to monitor.

## 2014-06-09 NOTE — Progress Notes (Signed)
Patient c/o feeling dizzy after standing up and pivoting from chair to bed. Patient also threw up after the transfer.

## 2014-06-09 NOTE — Progress Notes (Signed)
CARE MANAGEMENT NOTE 06/09/2014  Patient:  Kelly Richardson,Kelly Richardson   Account Number:  [id number]  Date Initiated:  06/09/2014  Documentation initiated by:  Gladie Gravette  Subjective/Objective Assessment:   63 yr old woman, s/p I +D of rt hip w/ revision of cup and abt bead placement.  Has rolling walker.     Action/Plan:   Pt to get PICC line for longterm antibiotics.  Will need shortterm rehab has no home support   Anticipated DC Date:     Anticipated DC Plan:    In-house referral  Clinical Social Worker         Choice offered to / List presented to:             Status of service:  In process, will continue to follow Medicare Important Message given?   (If response is "NO", the following Medicare IM given date fields will be blank) Date Medicare IM given:   Medicare IM given by:   Date Additional Medicare IM given:   Additional Medicare IM given by:    Discharge Disposition:    Per UR Regulation:    If discussed at Long Length of Stay Meetings, dates discussed:    Comments:

## 2014-06-09 NOTE — Plan of Care (Signed)
Problem: Consults Goal: General Surgical Patient Education (See Patient Education module for education specifics) Outcome: Completed/Met Date Met:  06/09/14 Goal: Skin Care Protocol Initiated - if Braden Score 18 or less If consults are not indicated, leave blank or document N/A Outcome: Completed/Met Date Met:  06/09/14 Goal: Nutrition Consult-if indicated Outcome: Not Applicable Date Met:  32/91/91 Goal: Diabetes Guidelines if Diabetic/Glucose > 140 If diabetic or lab glucose is > 140 mg/dl - Initiate Diabetes/Hyperglycemia Guidelines & Document Interventions  Outcome: Completed/Met Date Met:  06/09/14  Problem: Phase I Progression Outcomes Goal: Pain controlled with appropriate interventions Outcome: Progressing Goal: Tubes/drains patent Outcome: Not Applicable Date Met:  66/06/00 Goal: Voiding-avoid urinary catheter unless indicated Outcome: Completed/Met Date Met:  06/09/14

## 2014-06-09 NOTE — Progress Notes (Signed)
Pts BP rechecked & is 105/63. Pt is resting comfortably with no signs of distress. Nursing will continue to monitor.

## 2014-06-09 NOTE — Progress Notes (Signed)
[facility] notify MD that patient is having difficulty swallowing even small pills.

## 2014-06-10 LAB — CBC
HEMATOCRIT: 23.7 % — AB (ref 36.0–46.0)
HEMOGLOBIN: 7.6 g/dL — AB (ref 12.0–15.0)
MCH: 26.3 pg (ref 26.0–34.0)
MCHC: 32.1 g/dL (ref 30.0–36.0)
MCV: 82 fL (ref 78.0–100.0)
Platelets: 207 10*3/uL (ref 150–400)
RBC: 2.89 MIL/uL — ABNORMAL LOW (ref 3.87–5.11)
RDW: 16 % — AB (ref 11.5–15.5)
WBC: 8.8 10*3/uL (ref 4.0–10.5)

## 2014-06-10 LAB — BASIC METABOLIC PANEL
Anion gap: 13 (ref 5–15)
BUN: 9 mg/dL (ref 6–23)
CHLORIDE: 99 meq/L (ref 96–112)
CO2: 24 meq/L (ref 19–32)
Calcium: 9.1 mg/dL (ref 8.4–10.5)
Creatinine, Ser: 0.86 mg/dL (ref 0.50–1.10)
GFR calc non Af Amer: 72 mL/min — ABNORMAL LOW (ref >90)
GFR, EST AFRICAN AMERICAN: 83 mL/min — AB (ref >90)
Glucose, Bld: 143 mg/dL — ABNORMAL HIGH (ref 70–99)
POTASSIUM: 3.9 meq/L (ref 3.7–5.3)
Sodium: 136 mEq/L — ABNORMAL LOW (ref 137–147)

## 2014-06-10 LAB — GLUCOSE, CAPILLARY
Glucose-Capillary: 155 mg/dL — ABNORMAL HIGH (ref 70–99)
Glucose-Capillary: 194 mg/dL — ABNORMAL HIGH (ref 70–99)
Glucose-Capillary: 161 mg/dL — ABNORMAL HIGH (ref 70–99)

## 2014-06-10 MED ORDER — VANCOMYCIN HCL 10 G IV SOLR
1250.0000 mg | Freq: Two times a day (BID) | INTRAVENOUS | Status: DC
  Administered 2014-06-10 – 2014-06-12 (×5): 1250 mg via INTRAVENOUS
  Filled 2014-06-10 (×6): qty 1250

## 2014-06-10 MED ORDER — SODIUM CHLORIDE 0.9 % IJ SOLN
10.0000 mL | Freq: Two times a day (BID) | INTRAMUSCULAR | Status: DC
  Administered 2014-06-11: 10 mL

## 2014-06-10 MED ORDER — SODIUM CHLORIDE 0.9 % IJ SOLN
10.0000 mL | INTRAMUSCULAR | Status: DC | PRN
  Administered 2014-06-11 – 2014-06-12 (×5): 10 mL
  Filled 2014-06-10 (×5): qty 40

## 2014-06-10 NOTE — Clinical Social Work Psychosocial (Signed)
     Clinical Social Work Department BRIEF PSYCHOSOCIAL ASSESSMENT 06/10/2014  Patient:  Kelly Richardson,Kelly Richardson     Account Number:  [id number]     Admit date:  06/08/2014  Clinical Social Worker:  Harless NakayamaAMBELAL,Katriona Schmierer, LCSWA  Date/Time:  06/10/2014 04:31 PM  Referred by:  Physician  Date Referred:  06/10/2014 Referred for  SNF Placement   Other Referral:   Interview type:  Patient Other interview type:   Spoke with pt and pt significant other at bedside    PSYCHOSOCIAL DATA Living Status:  SIGNIFICANT OTHER Admitted from facility:   Level of care:   Primary support name:  Kelly Richardson Primary support relationship to patient:  PARTNER Degree of support available:   Pt has good support    CURRENT CONCERNS Current Concerns  Post-Acute Placement   Other Concerns:    SOCIAL WORK ASSESSMENT / PLAN CSW visited pt room to discuss SNF recommendation. Pt and pt significant other Rick in good spirits. Pt informed CSW she knew she would need ST rehab because of limited assist available at home. Pt informed CSW she [facility]. While she had a great experience, she is unsure if she wants to return because it will be further aware from her friends/family. CSW explained SNF referral process and pt is agreeable to CSW sending [facility]. Pt reported to CSW her main concern is finding somewhere that has available staff to meet each pt's needs.   Assessment/plan status:  Psychosocial Support/Ongoing Assessment of Needs Other assessment/ plan:   Information/referral to community resources:   SNF list to be provided with bed offers    PATIENTS/FAMILYS RESPONSE TO PLAN OF CARE: Pt seated in recliner during visit and pleasant. Pt agreeable to ST rehab    Kelly Richardson, LCSWA  Weekend CSW  [phone]

## 2014-06-10 NOTE — Progress Notes (Signed)
Patient ID: Kelly Richardson, female   DOB: 1951/05/09, 63 y.o.   MRN: [id number] White blood cell count has dropped to 8.8. Hemoglobin has dropped to 7.6. Patient to receive PICC line today. Continue IV vancomycin. Cultures pending. Infectious disease consult on Monday. Patient extremely slow with ambulation will need discharge to skilled nursing.

## 2014-06-10 NOTE — Clinical Social Work Placement (Addendum)
    Clinical Social Work Department CLINICAL SOCIAL WORK PLACEMENT NOTE 06/10/2014  Patient:  Kelly Richardson,Kelly Richardson  Account Number:  [id number] Admit date:  06/08/2014  Clinical Social Worker:  Sharol HarnessPOONUM AMBELAL, Theresia MajorsLCSWA  Date/time:  06/10/2014 04:35 PM  Clinical Social Work is seeking post-discharge placement for this patient at the following level of care:   SKILLED NURSING   (*CSW will update this form in Epic as items are completed)   06/10/2014  Patient/family provided with [facility] of Clinical Social Work's list of facilities offering this level of care within the geographic area requested by the patient (or if unable, by the patient's family).  06/10/2014  Patient/family informed of their freedom to choose among providers that offer the needed level of care, that participate in Medicare, Medicaid or managed care program needed by the patient, have an available bed and are willing to accept the patient.  06/10/2014  Patient/family informed of [facility]' ownership interest in [facility], as well as of the fact that they are under no obligation to receive care at this facility.  PASARR submitted to EDS on existing PASARR number received on   FL2 transmitted to all facilities in geographic area requested by pt/family on  06/10/2014 FL2 transmitted to all facilities within larger geographic area on   Patient informed that his/her managed care company has contracts with or will negotiate with  certain facilities, including the following:     Patient/family informed of bed offers received:  06/11/2014 [facility] Physician recommends and patient chooses bed at  n/a  [facility]  Patient to be transferred to facility by [facility] Patient and family notified of transfer on 06/12/2014 Name of family member notified:  Patient is alert and oriented and states no wishes for CSW to contact family.  The  following physician request were entered in Epic: Physician Request  Please sign FL2.    Additional CommentsHarless Nakayama:   Poonum Ambelal, LCSWA Weekend CSW [phone]

## 2014-06-10 NOTE — Progress Notes (Signed)
Physical Therapy Treatment Patient Details Name: Kelly Richardson MRN: [id number] DOB: Jan 21, 1951 Today's Date: 06/10/2014    History of Present Illness 63 y.o. female with a diagnosis of Infected Right Total Hip Arthroplasty; pt s/p Rt THA revision.     PT Comments    Pt very anxious. Requires max encouragement to participate in therapy. Yells out intermittently in pain. Cont to recommend SNF due to lack of caregiver support at home.   Follow Up Recommendations  SNF;Supervision/Assistance - 24 hour     Equipment Recommendations  None recommended by PT    Recommendations for Other Services       Precautions / Restrictions Precautions Precautions: Posterior Hip;Fall Precaution Booklet Issued: Yes (comment) Precaution Comments: pt unable to recall hip precautions; reinforced throughout session Required Braces or Orthoses: Other Brace/Splint;Knee Immobilizer - Right Knee Immobilizer - Right: Other (comment) Other Brace/Splint: abduction pillow Restrictions Weight Bearing Restrictions: Yes RLE Weight Bearing: Weight bearing as tolerated    Mobility  Bed Mobility Overal bed mobility: Needs Assistance Bed Mobility: Supine to Sit     Supine to sit: Min assist;HOB elevated     General bed mobility comments: min (A) to advance Rt LE and control off EOB; cues for sequencing and hip precautions   Transfers Overall transfer level: Needs assistance Equipment used: Rolling walker (2 wheeled) Transfers: Sit to/from Stand Sit to Stand: Min guard         General transfer comment: cues for hand placement and hip precautions; pt very anxious and fearful of falling[facility]   Ambulation/Gait Ambulation/Gait assistance: Min assist Ambulation Distance (Feet): 20 Feet Assistive device: Rolling walker (2 wheeled) Gait Pattern/deviations: Step-to pattern;Decreased step length - left;Decreased stance time - right;Antalgic;Wide base of support;Trunk flexed Gait  velocity: decreased  Gait velocity interpretation: Below normal speed for age/gender General Gait Details: (A) to balance and manage RW; cues for hip precautions    Stairs            Wheelchair Mobility    Modified Rankin (Stroke Patients Only)       Balance Overall balance assessment: Needs assistance Sitting-balance support: Feet supported;No upper extremity supported Sitting balance-Leahy Scale: Fair     Standing balance support: During functional activity;Bilateral upper extremity supported Standing balance-Leahy Scale: Poor Standing balance comment: relies on RW for balance                     Cognition Arousal/Alertness: Awake/alert Behavior During Therapy: Anxious Overall Cognitive Status: Within Functional Limits for tasks assessed       Memory: Decreased recall of precautions              Exercises Total Joint Exercises Ankle Circles/Pumps: AROM;Strengthening;10 reps;Seated    General Comments General comments (skin integrity, edema, etc.): drainage noted on dressing; RN made aware       Pertinent Vitals/Pain Pain Assessment: 0-10 Pain Score: 10-Worst pain ever Pain Location: Rt hip Pain Descriptors / Indicators: Crying;Sharp;Guarding;Grimacing Pain Intervention(s): Limited activity within patient's tolerance;Monitored during session;Premedicated before session;Repositioned    Home Living                      Prior Function            PT Goals (current goals can now be found in the care plan section) Acute Rehab PT Goals Patient Stated Goal: to go get rehab then home PT Goal Formulation: With patient Time For Goal Achievement: 06/16/14 Potential to Achieve  Goals: Good Progress towards PT goals: Progressing toward goals    Frequency  7X/week    PT Plan Current plan remains appropriate    Co-evaluation             End of Session Equipment Utilized During Treatment: Gait belt Activity Tolerance: Patient  limited by pain Patient left: in chair;with call bell/phone within reach;with family/visitor present     Time: 1610-96041500-1515 PT Time Calculation (min) (ACUTE ONLY): 15 min  Charges:  $Gait Training: 8-22 mins                    G CodesDonell Richardson:      Kelly Richardson, [place]  [phone] 06/10/2014, 3:43 PM

## 2014-06-10 NOTE — Progress Notes (Signed)
Peripherally Inserted Central Catheter/Midline Placement  The IV Nurse has discussed with the patient and/or persons authorized to consent for the patient, the purpose of this procedure and the potential benefits and risks involved with this procedure.  The benefits include less needle sticks, lab draws from the catheter and patient may be discharged home with the catheter.  Risks include, but not limited to, infection, bleeding, blood clot (thrombus formation), and puncture of an artery; nerve damage and irregular heat beat.  Alternatives to this procedure were also discussed.  PICC/Midline Placement Documentation  PICC / Midline Single Lumen 06/10/14 PICC Right Basilic 36 cm 0 cm (Active)  Indication for Insertion or Continuance of Line Home intravenous therapies (PICC only) 06/10/2014  8:40 AM  Exposed Catheter (cm) 0 cm 06/10/2014  8:40 AM  Site Assessment Clean;Dry;Intact 06/10/2014  8:40 AM  Dressing Type Transparent 06/10/2014  8:40 AM  Dressing Change Due 06/17/14 06/10/2014  8:40 AM       Ethelda Chickurrie, Icholas Irby Robert 06/10/2014, 8:42 AM

## 2014-06-10 NOTE — Progress Notes (Signed)
ANTIBIOTIC CONSULT NOTE - INITIAL  Pharmacy Consult for Vancomycin Indication: Wound Infection  Allergies  Allergen Reactions  . Penicillins Anaphylaxis  . Seldane [Terfenadine] Other (See Comments)    N/V, increased heart rate, seizures, hallinations.  . Sulfonamide Derivatives Anaphylaxis  . Other Nausea And Vomiting    "Anything I have ever been given more nausea just increases it."  . Iohexol      Desc: pt states she [place] ct [place] 2000 and immediately experienced profuse vomiting and sweating.  She said they treated her like she was having a reaction and doesn't remember much else or if meds were given., Onset Date: [id number]   . Latex Other (See Comments)    BLISTERING  . Metformin Other (See Comments)    REACTION: faintness, DIZZINESS ,  . Xanax [Alprazolam]     Pt does not want Xanax--history of addiction.    Patient Measurements: Height: 5\' 1"  (154.9 cm) Weight: 220 lb (99.791 kg) IBW/kg (Calculated) : 47.8  Vital Signs: Temp: 100.4 F (38 C) (12/06 0520) Temp Source: Oral (12/06 0520) BP: 91/52 mmHg (12/06 0520) Pulse Rate: 102 (12/06 0520) Intake/Output from previous day: 12/05 0701 - 12/06 0700 In: 3079.3 [P.O.:840; I.V.:2039.3; IV Piggyback:200] Out: 851 [Urine:850; Emesis/NG output:1] Intake/Output from this shift:    Labs:  Recent Labs  06/08/14 1445 06/09/14 0653 06/10/14 0501  WBC 11.0* 12.0* 8.8  HGB 11.8* 8.4* 7.6*  PLT 289 270 207  CREATININE 0.85 0.73 0.86   Estimated Creatinine Clearance: 75.3 mL/min (by C-G formula based on Cr of 0.86). No results for input(s): VANCOTROUGH, VANCOPEAK, VANCORANDOM, GENTTROUGH, GENTPEAK, GENTRANDOM, TOBRATROUGH, TOBRAPEAK, TOBRARND, AMIKACINPEAK, AMIKACINTROU, AMIKACIN in the last 72 hours.   Microbiology: Recent Results (from the past 720 hour(s))  Anaerobic culture     Status: None (Preliminary result)   Collection Time: 06/08/14  5:51 PM  Result Value Ref Range Status   Specimen Description  ABSCESS RIGHT HIP  Final   Special Requests PATIENT ON FOLLOWING DOXYCYCLINE  Final   Gram Stain   Final    NO WBC SEEN NO SQUAMOUS EPITHELIAL CELLS SEEN NO ORGANISMS SEEN Performed at [facility]    Culture   Final    NO ANAEROBES ISOLATED; CULTURE IN PROGRESS FOR 5 DAYS Performed at [facility]    Report Status PENDING  Incomplete  Culture, routine-abscess     Status: None (Preliminary result)   Collection Time: 06/08/14  5:51 PM  Result Value Ref Range Status   Specimen Description ABSCESS RIGHT HIP  Final   Special Requests PATIENT ON FOLLOWING DOXYCYCLINE  Final   Gram Stain   Final    NO WBC SEEN NO SQUAMOUS EPITHELIAL CELLS SEEN NO ORGANISMS SEEN Performed at [facility]    Culture   Final    NO GROWTH 2 DAYS Performed at [facility]    Report Status PENDING  Incomplete    Medical History: Past Medical History  Diagnosis Date  . Diabetes mellitus without complication   . Complication of anesthesia   . PONV (postoperative nausea and vomiting)   . Hypertension   . Heart murmur     as a child  . 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 Shortness of breath dyspnea   . Arthritis     Medications:  Anti-infectives    Start     Dose/Rate Route Frequency Ordered Stop   06/09/14 0600  vancomycin (VANCOCIN) IVPB 1000  mg/200 mL premix     1,000 mg200 mL/hr over 60 Minutes Intravenous On call to O.R. 06/08/14 1421 06/08/14 1754   06/08/14 2130  vancomycin (VANCOCIN) IVPB 1000 mg/200 mL premix    Comments:  Pharmacy to dose treatment for IV vancomycin for at least 6 weeks   1,000 mg200 mL/hr over 60 Minutes Intravenous Every 12 hours 06/08/14 2114 07/23/14 2129   06/08/14 1738  gentamicin (GARAMYCIN) injection  Status:  Discontinued       As needed 06/08/14 1738 06/08/14 1847   06/08/14 1738  vancomycin (VANCOCIN) powder  Status:  Discontinued       As needed 06/08/14 1739 06/08/14 1847     Assessment: 63 year  old female with infected THA to start IV vancomycin per pharmacy dosing.  WBC down to 8.8. Tmax 100.4. SCr 0.86 with estimated CrCl ~ 75 mL/min- stable. UOP 0.4- unsure if accurate. PICC line to be placed today. Plan for vancomycin for 6 weeks. ID consult on Monday. Currently patient on vancomycin 1g IV q12h - last dose was given at 06/09/14 at 2045PM.   Goal of Therapy:  Vancomycin trough level 15-20 mcg/ml  Plan:  1. Increase Vancomycin 1250mg  IV q12h for higher trough.  2. Follow-up renal function, clinical status, and culture results.  3. Plan for vancomycin trough at steady state.   Link SnufferJessica Emmett Bracknell, PharmD, BCPS Clinical Pharmacist [phone] 06/10/2014,7:53 AM

## 2014-06-10 NOTE — Progress Notes (Signed)
OT Cancellation Note  Patient Details Name: Kelly Richardson MRN: [id number] DOB: 1950-09-13   Cancelled Treatment:    Reason Eval/Treat Not Completed: OT screened. Pt is Medicare/Medicaid and current D/C plan is SNF (spoke with PT). No apparent immediate acute care OT needs, therefore will defer OT to SNF. If OT eval is needed please call Acute Rehab Dept. at [phone] or text page OT at [phone].    Earlie RavelingStraub, Ladeana Laplant L OTR/L [phone] 06/10/2014, 9:39 AM

## 2014-06-11 DIAGNOSIS — M869 Osteomyelitis, unspecified: Secondary | ICD-10-CM

## 2014-06-11 DIAGNOSIS — E119 Type 2 diabetes mellitus without complications: Secondary | ICD-10-CM

## 2014-06-11 LAB — CBC
HEMATOCRIT: 23 % — AB (ref 36.0–46.0)
Hemoglobin: 7.3 g/dL — ABNORMAL LOW (ref 12.0–15.0)
MCH: 26.2 pg (ref 26.0–34.0)
MCHC: 31.7 g/dL (ref 30.0–36.0)
MCV: 82.4 fL (ref 78.0–100.0)
PLATELETS: 209 10*3/uL (ref 150–400)
RBC: 2.79 MIL/uL — ABNORMAL LOW (ref 3.87–5.11)
RDW: 16.2 % — AB (ref 11.5–15.5)
WBC: 9 10*3/uL (ref 4.0–10.5)

## 2014-06-11 LAB — CULTURE, ROUTINE-ABSCESS
Culture: NO GROWTH
Gram Stain: NONE SEEN

## 2014-06-11 LAB — BASIC METABOLIC PANEL
ANION GAP: 15 (ref 5–15)
BUN: 7 mg/dL (ref 6–23)
CALCIUM: 9 mg/dL (ref 8.4–10.5)
CHLORIDE: 100 meq/L (ref 96–112)
CO2: 25 mEq/L (ref 19–32)
CREATININE: 0.74 mg/dL (ref 0.50–1.10)
Glucose, Bld: 131 mg/dL — ABNORMAL HIGH (ref 70–99)
Potassium: 3.6 mEq/L — ABNORMAL LOW (ref 3.7–5.3)
Sodium: 140 mEq/L (ref 137–147)

## 2014-06-11 LAB — GLUCOSE, CAPILLARY
GLUCOSE-CAPILLARY: 136 mg/dL — AB (ref 70–99)
Glucose-Capillary: 183 mg/dL — ABNORMAL HIGH (ref 70–99)
Glucose-Capillary: 172 mg/dL — ABNORMAL HIGH (ref 70–99)
Glucose-Capillary: 155 mg/dL — ABNORMAL HIGH (ref 70–99)
Glucose-Capillary: 160 mg/dL — ABNORMAL HIGH (ref 70–99)
Glucose-Capillary: 170 mg/dL — ABNORMAL HIGH (ref 70–99)

## 2014-06-11 LAB — C-REACTIVE PROTEIN: CRP: 9.3 mg/dL — AB (ref <0.60)

## 2014-06-11 LAB — SEDIMENTATION RATE: Sed Rate: 105 mm/hr — ABNORMAL HIGH (ref 0–22)

## 2014-06-11 MED ORDER — LEVOFLOXACIN 750 MG PO TABS
750.0000 mg | ORAL_TABLET | Freq: Every day | ORAL | Status: DC
  Administered 2014-06-11 – 2014-06-12 (×2): 750 mg via ORAL
  Filled 2014-06-11 (×3): qty 1

## 2014-06-11 NOTE — Progress Notes (Signed)
Physical Therapy Treatment Patient Details Name: Kelly Richardson MRN: [id number] DOB: 1950/08/10 Today's Date: 06/11/2014    History of Present Illness 63 y.o. female with a diagnosis of Infected Right Total Hip Arthroplasty; pt s/p Rt THA revision.     PT Comments    Continuing progress with mobility; Needs reinforcement for hip prec; dizzy sitting in recliner at end of session; reported to RN and Nurse tech, who was on her was in to get a blood sugar and BP; Reclined pt to near flat   Anticipate good progress with consistent therapies at SNF   Follow Up Recommendations  SNF;Supervision/Assistance - 24 hour     Equipment Recommendations  None recommended by PT    Recommendations for Other Services       Precautions / Restrictions Precautions Precautions: Posterior Hip;Fall Precaution Comments: Reviewed post hip prec; continues to need reinforcement Required Braces or Orthoses: Other Brace/Splint;Knee Immobilizer - Right Knee Immobilizer - Right:  (in bed) Restrictions RLE Weight Bearing: Weight bearing as tolerated    Mobility  Bed Mobility Overal bed mobility: Needs Assistance Bed Mobility: Supine to Sit     Supine to sit: HOB elevated;Min assist     General bed mobility comments: Cues for sequencing and hip precautions; min handheld assist to pull to sit  Transfers Overall transfer level: Needs assistance Equipment used: Rolling walker (2 wheeled) Transfers: Sit to/from Stand Sit to Stand: Min guard         General transfer comment: cues for hand placement and hip precautions  Ambulation/Gait Ambulation/Gait assistance: Supervision Ambulation Distance (Feet): 25 Feet Assistive device: Rolling walker (2 wheeled) Gait Pattern/deviations: Step-to pattern (emerging step-through) Gait velocity: decreased    General Gait Details: Cues for hip precautions, especially with turns; cues aslo to self-monitor for activity tolerance as pt reproted some dizziness  during walk   Stairs            Wheelchair Mobility    Modified Rankin (Stroke Patients Only)       Balance             Standing balance-Leahy Scale: Poor                      Cognition Arousal/Alertness: Awake/alert Behavior During Therapy: WFL for tasks assessed/performed Overall Cognitive Status: Within Functional Limits for tasks assessed       Memory: Decreased recall of precautions              Exercises Total Joint Exercises Ankle Circles/Pumps: AROM;Both;10 reps Quad Sets: AROM;Right;10 reps Gluteal Sets: AROM;Both;10 reps Towel Squeeze: AROM;10 reps;Right Heel Slides: AAROM;Right;10 reps Hip ABduction/ADduction: AAROM;Right;10 reps    General Comments        Pertinent Vitals/Pain Pain Assessment: 0-10 Pain Score: 6  Pain Location: R hip with therex; much less pain with amb Pain Descriptors / Indicators: Grimacing Pain Intervention(s): Limited activity within patient's tolerance;Monitored during session;Repositioned    Home Living                      Prior Function            PT Goals (current goals can now be found in the care plan section) Acute Rehab PT Goals Patient Stated Goal: to go get rehab then home PT Goal Formulation: With patient Time For Goal Achievement: 06/16/14 Potential to Achieve Goals: Good Progress towards PT goals: Progressing toward goals    Frequency  7X/week    PT Plan Current  plan remains appropriate    Co-evaluation             End of Session Equipment Utilized During Treatment: Gait belt Activity Tolerance: Patient tolerated treatment well;Other (comment) (though dizzy end of session) Patient left: in chair;with call bell/phone within reach     Time: 1126-1156 PT Time Calculation (min) (ACUTE ONLY): 30 min  Charges:  $Gait Training: 8-22 mins $Therapeutic Exercise: 8-22 mins                    G Codes:      Van ClinesGarrigan, Jayci Ellefson Hamff 06/11/2014, 1:06 PM  Van ClinesHolly  Leshae Mcclay, [place]PT  Acute Rehabilitation Services Pager [phone] Office [phone]'

## 2014-06-11 NOTE — Progress Notes (Signed)
Patient ID: Kelly Richardson, female   DOB: 07-11-1950, 63 y.o.   MRN: [id number] Cultures negative to date. Consults infectious disease today. Repeat CBC and basic metabolic profile today.

## 2014-06-11 NOTE — Consult Note (Addendum)
[facility] for Infectious Disease  Date of Admission:  06/08/2014  Date of Consult:  06/11/2014  Reason for Consult: Osteomyelitis R THR Referring Physician: Sharol Given  Impression/Recommendation Osteomyelitis R hip- Cx (-) DM2  Would Continue vanco Add levaquin po PIC placed Check HIV check ESR and CRP Diabetic control Have her see optho in f/u (i gave her the phone #).  Will see her in ID clinic in f/u.   Thank you so much for this interesting consult,   Bobby Rumpf (pager) [phone] [email or web address]  Kelly Richardson is an 63 y.o. female.  HPI: 63 yo F with hx of DM2 for last year, who had R THR 4 years ago. She had open wound and clear d/c for the last 6 months. She was seen by her PMD and was given topical anbx for this. Finally ~1 week ago, her leg began to swell and she was started on po doxy. She states that the d/c became purulent at this point. She was sent to general surgery who immediately sent her to ortho. She had a CT showing a possible abscess and she was brought to [facility] emergently on 12-4.    Past Medical History  Diagnosis Date  . Diabetes mellitus without complication   . Complication of anesthesia   . PONV (postoperative nausea and vomiting)   . Hypertension   . Heart murmur     as a child  . 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 Shortness of breath dyspnea   . Arthritis     Past Surgical History  Procedure Laterality Date  . Total hip arthroplasty Right 2011  . Abdominal hysterectomy    . Cholecystectomy  1995  . Tonsillectomy       Allergies  Allergen Reactions  . Penicillins Anaphylaxis  . Seldane [Terfenadine] Other (See Comments)    N/V, increased heart rate, seizures, hallinations.  . Sulfonamide Derivatives Anaphylaxis  . Other Nausea And Vomiting    "Anything I have ever been given more nausea just increases it."  . Iohexol      Desc: pt states she had [place] ct [place] in 2000 and immediately  experienced profuse vomiting and sweating.  She said they treated her like she was having a reaction and doesn't remember much else or if meds were given., Onset Date: [id number]   . Latex Other (See Comments)    BLISTERING  . Metformin Other (See Comments)    REACTION: faintness, DIZZINESS ,  . Xanax [Alprazolam]     Pt does not want Xanax--history of addiction.    Medications:  Scheduled: . aspirin EC  325 mg Oral Q breakfast  . ferrous sulfate  325 mg Oral TID PC  . hydrochlorothiazide  12.5 mg Oral Daily  . insulin aspart  4 Units Subcutaneous TID WC  . insulin detemir  10 Units Subcutaneous QHS  . lisinopril  20 mg Oral Daily  . repaglinide  1 mg Oral TID AC  . sodium chloride  10-40 mL Intracatheter Q12H  . vancomycin  1,250 mg Intravenous Q12H    Abtx:  Anti-infectives    Start     Dose/Rate Route Frequency Ordered Stop   06/10/14 0930  vancomycin (VANCOCIN) 1,250 mg in sodium chloride 0.9 % 250 mL IVPB    Comments:  Pharmacy to dose treatment for IV vancomycin for at least 6 weeks   1,250 mg166.7 mL/hr over 90 Minutes Intravenous Every 12 hours 06/10/14 0801 07/23/14  2129   06/09/14 0600  vancomycin (VANCOCIN) IVPB 1000 mg/200 mL premix     1,000 mg200 mL/hr over 60 Minutes Intravenous On call to O.R. 06/08/14 1421 06/08/14 1754   06/08/14 2130  vancomycin (VANCOCIN) IVPB 1000 mg/200 mL premix  Status:  Discontinued    Comments:  Pharmacy to dose treatment for IV vancomycin for at least 6 weeks   1,000 mg200 mL/hr over 60 Minutes Intravenous Every 12 hours 06/08/14 2114 06/10/14 0801   06/08/14 1738  gentamicin (GARAMYCIN) injection  Status:  Discontinued       As needed 06/08/14 1738 06/08/14 1847   06/08/14 1738  vancomycin (VANCOCIN) powder  Status:  Discontinued       As needed 06/08/14 1739 06/08/14 1847      Total days of antibiotics 4 vanco          Social History:  reports that she has never smoked. She does not have any smokeless tobacco history on file. She  reports that she does not drink alcohol or use illicit drugs.  History reviewed. No pertinent family history.  General ROS: denies f/c, change in BM, normal urination. n ophtho exam this year. has numbness in R foot since surgery. chronic cough.   Blood pressure 102/64, pulse 94, temperature 98.2 F (36.8 C), temperature source Oral, resp. rate 18, height '5\' 1"'  (1.549 m), weight 108.092 kg (238 lb 4.8 oz), SpO2 98 %. General appearance: alert, cooperative and no distress Eyes: negative findings: conjunctivae and sclerae normal and pupils equal, round, reactive to light and accomodation Throat: normal findings: oropharynx pink & moist without lesions or evidence of thrush Neck: no adenopathy and supple, symmetrical, trachea midline Lungs: clear to auscultation bilaterally Heart: regular rate and rhythm Abdomen: normal findings: bowel sounds normal and soft, non-tender Extremities: R hip wound is dressed. there is mod edema. there is mild tenderness above wound. there is no erythema or streaking.  Skin: no diabetic foot lesions, dense callus on L great toe.  Normal light touch in BLE grossly.    Results for orders placed or performed during the hospital encounter of 06/08/14 (from the past 48 hour(s))  Glucose, capillary     Status: Abnormal   Collection Time: 06/09/14  4:42 PM  Result Value Ref Range   Glucose-Capillary 177 (H) 70 - 99 mg/dL  Glucose, capillary     Status: Abnormal   Collection Time: 06/09/14  9:39 PM  Result Value Ref Range   Glucose-Capillary 185 (H) 70 - 99 mg/dL  CBC     Status: Abnormal   Collection Time: 06/10/14  5:01 AM  Result Value Ref Range   WBC 8.8 4.0 - 10.5 K/uL   RBC 2.89 (L) 3.87 - 5.11 MIL/uL   Hemoglobin 7.6 (L) 12.0 - 15.0 g/dL   HCT 23.7 (L) 36.0 - 46.0 %   MCV 82.0 78.0 - 100.0 fL   MCH 26.3 26.0 - 34.0 pg   MCHC 32.1 30.0 - 36.0 g/dL   RDW 16.0 (H) 11.5 - 15.5 %   Platelets 207 150 - 400 K/uL    Comment: REPEATED TO VERIFY DELTA CHECK  NOTED SPECIMEN CHECKED FOR CLOTS   Basic metabolic panel     Status: Abnormal   Collection Time: 06/10/14  5:01 AM  Result Value Ref Range   Sodium 136 (L) 137 - 147 mEq/L   Potassium 3.9 3.7 - 5.3 mEq/L    Comment: HEMOLYSIS AT THIS LEVEL MAY AFFECT RESULT   Chloride 99 96 -  112 mEq/L   CO2 24 19 - 32 mEq/L   Glucose, Bld 143 (H) 70 - 99 mg/dL   BUN 9 6 - 23 mg/dL   Creatinine, Ser 0.86 0.50 - 1.10 mg/dL   Calcium 9.1 8.4 - 10.5 mg/dL   GFR calc non Af Amer 72 (L) >90 mL/min   GFR calc Af Amer 83 (L) >90 mL/min    Comment: (NOTE) The eGFR has been calculated using the CKD EPI equation. This calculation has not been validated in all clinical situations. eGFR's persistently <90 mL/min signify possible Chronic Kidney Disease.    Anion gap 13 5 - 15  Glucose, capillary     Status: Abnormal   Collection Time: 06/10/14  6:22 AM  Result Value Ref Range   Glucose-Capillary 155 (H) 70 - 99 mg/dL  Glucose, capillary     Status: Abnormal   Collection Time: 06/10/14 11:24 AM  Result Value Ref Range   Glucose-Capillary 194 (H) 70 - 99 mg/dL  Glucose, capillary     Status: Abnormal   Collection Time: 06/10/14  4:22 PM  Result Value Ref Range   Glucose-Capillary 161 (H) 70 - 99 mg/dL  Glucose, capillary     Status: Abnormal   Collection Time: 06/10/14  9:40 PM  Result Value Ref Range   Glucose-Capillary 183 (H) 70 - 99 mg/dL  CBC     Status: Abnormal   Collection Time: 06/11/14  6:00 AM  Result Value Ref Range   WBC 9.0 4.0 - 10.5 K/uL   RBC 2.79 (L) 3.87 - 5.11 MIL/uL   Hemoglobin 7.3 (L) 12.0 - 15.0 g/dL   HCT 23.0 (L) 36.0 - 46.0 %   MCV 82.4 78.0 - 100.0 fL   MCH 26.2 26.0 - 34.0 pg   MCHC 31.7 30.0 - 36.0 g/dL   RDW 16.2 (H) 11.5 - 15.5 %   Platelets 209 150 - 400 K/uL  Basic metabolic panel     Status: Abnormal   Collection Time: 06/11/14  6:00 AM  Result Value Ref Range   Sodium 140 137 - 147 mEq/L   Potassium 3.6 (L) 3.7 - 5.3 mEq/L   Chloride 100 96 - 112 mEq/L    CO2 25 19 - 32 mEq/L   Glucose, Bld 131 (H) 70 - 99 mg/dL   BUN 7 6 - 23 mg/dL   Creatinine, Ser 0.74 0.50 - 1.10 mg/dL   Calcium 9.0 8.4 - 10.5 mg/dL   GFR calc non Af Amer >90 >90 mL/min   GFR calc Af Amer >90 >90 mL/min    Comment: (NOTE) The eGFR has been calculated using the CKD EPI equation. This calculation has not been validated in all clinical situations. eGFR's persistently <90 mL/min signify possible Chronic Kidney Disease.    Anion gap 15 5 - 15  Glucose, capillary     Status: Abnormal   Collection Time: 06/11/14  6:16 AM  Result Value Ref Range   Glucose-Capillary 136 (H) 70 - 99 mg/dL  Glucose, capillary     Status: Abnormal   Collection Time: 06/11/14 11:58 AM  Result Value Ref Range   Glucose-Capillary 172 (H) 70 - 99 mg/dL      Component Value Date/Time   SDES ABSCESS RIGHT HIP 06/08/2014 1751   SDES ABSCESS RIGHT HIP 06/08/2014 1751   SPECREQUEST PATIENT ON FOLLOWING DOXYCYCLINE 06/08/2014 1751   SPECREQUEST PATIENT ON FOLLOWING DOXYCYCLINE 06/08/2014 1751   CULT  06/08/2014 1751    NO ANAEROBES ISOLATED; CULTURE IN  PROGRESS FOR 5 DAYS Performed at [facility]  06/08/2014 1751    NO GROWTH 3 DAYS Performed at [facility] PENDING 06/08/2014 1751   REPTSTATUS 06/11/2014 FINAL 06/08/2014 1751   No results found. Recent Results (from the past 240 hour(s))  Anaerobic culture     Status: None (Preliminary result)   Collection Time: 06/08/14  5:51 PM  Result Value Ref Range Status   Specimen Description ABSCESS RIGHT HIP  Final   Special Requests PATIENT ON FOLLOWING DOXYCYCLINE  Final   Gram Stain   Final    NO WBC SEEN NO SQUAMOUS EPITHELIAL CELLS SEEN NO ORGANISMS SEEN Performed at [facility]    Culture   Final    NO ANAEROBES ISOLATED; CULTURE IN PROGRESS FOR 5 DAYS Performed at [facility]    Report Status PENDING  Incomplete  Culture, routine-abscess     Status: None   Collection  Time: 06/08/14  5:51 PM  Result Value Ref Range Status   Specimen Description ABSCESS RIGHT HIP  Final   Special Requests PATIENT ON FOLLOWING DOXYCYCLINE  Final   Gram Stain   Final    NO WBC SEEN NO SQUAMOUS EPITHELIAL CELLS SEEN NO ORGANISMS SEEN Performed at [facility]    Culture   Final    NO GROWTH 3 DAYS Performed at [facility]    Report Status 06/11/2014 FINAL  Final      06/11/2014, 3:58 PM     LOS: 3 days

## 2014-06-12 ENCOUNTER — Encounter (HOSPITAL_COMMUNITY): Payer: Self-pay | Admitting: Orthopedic Surgery

## 2014-06-12 DIAGNOSIS — Z8669 Personal history of other diseases of the nervous system and sense organs: Secondary | ICD-10-CM | POA: Diagnosis not present

## 2014-06-12 DIAGNOSIS — M869 Osteomyelitis, unspecified: Secondary | ICD-10-CM | POA: Diagnosis not present

## 2014-06-12 DIAGNOSIS — M25559 Pain in unspecified hip: Secondary | ICD-10-CM | POA: Diagnosis not present

## 2014-06-12 DIAGNOSIS — Z96649 Presence of unspecified artificial hip joint: Secondary | ICD-10-CM | POA: Diagnosis not present

## 2014-06-12 DIAGNOSIS — Z7982 Long term (current) use of aspirin: Secondary | ICD-10-CM | POA: Diagnosis not present

## 2014-06-12 DIAGNOSIS — Z452 Encounter for adjustment and management of vascular access device: Secondary | ICD-10-CM | POA: Diagnosis not present

## 2014-06-12 DIAGNOSIS — S73004A Unspecified dislocation of right hip, initial encounter: Secondary | ICD-10-CM | POA: Diagnosis not present

## 2014-06-12 DIAGNOSIS — I10 Essential (primary) hypertension: Secondary | ICD-10-CM | POA: Diagnosis not present

## 2014-06-12 DIAGNOSIS — Z794 Long term (current) use of insulin: Secondary | ICD-10-CM | POA: Diagnosis not present

## 2014-06-12 DIAGNOSIS — D500 Iron deficiency anemia secondary to blood loss (chronic): Secondary | ICD-10-CM | POA: Diagnosis not present

## 2014-06-12 DIAGNOSIS — Z471 Aftercare following joint replacement surgery: Secondary | ICD-10-CM | POA: Diagnosis not present

## 2014-06-12 DIAGNOSIS — M6281 Muscle weakness (generalized): Secondary | ICD-10-CM | POA: Diagnosis not present

## 2014-06-12 DIAGNOSIS — Z4801 Encounter for change or removal of surgical wound dressing: Secondary | ICD-10-CM | POA: Diagnosis not present

## 2014-06-12 DIAGNOSIS — M1611 Unilateral primary osteoarthritis, right hip: Secondary | ICD-10-CM | POA: Diagnosis not present

## 2014-06-12 DIAGNOSIS — R011 Cardiac murmur, unspecified: Secondary | ICD-10-CM | POA: Diagnosis not present

## 2014-06-12 DIAGNOSIS — E119 Type 2 diabetes mellitus without complications: Secondary | ICD-10-CM | POA: Diagnosis not present

## 2014-06-12 DIAGNOSIS — Z792 Long term (current) use of antibiotics: Secondary | ICD-10-CM | POA: Diagnosis not present

## 2014-06-12 DIAGNOSIS — Z880 Allergy status to penicillin: Secondary | ICD-10-CM | POA: Diagnosis not present

## 2014-06-12 DIAGNOSIS — M25551 Pain in right hip: Secondary | ICD-10-CM | POA: Diagnosis not present

## 2014-06-12 DIAGNOSIS — R1312 Dysphagia, oropharyngeal phase: Secondary | ICD-10-CM | POA: Diagnosis not present

## 2014-06-12 DIAGNOSIS — R0789 Other chest pain: Secondary | ICD-10-CM | POA: Diagnosis not present

## 2014-06-12 DIAGNOSIS — Z79899 Other long term (current) drug therapy: Secondary | ICD-10-CM | POA: Diagnosis not present

## 2014-06-12 DIAGNOSIS — R112 Nausea with vomiting, unspecified: Secondary | ICD-10-CM | POA: Diagnosis not present

## 2014-06-12 DIAGNOSIS — R2681 Unsteadiness on feet: Secondary | ICD-10-CM | POA: Diagnosis not present

## 2014-06-12 DIAGNOSIS — Z9660 Presence of unspecified orthopedic joint implant: Secondary | ICD-10-CM | POA: Diagnosis not present

## 2014-06-12 DIAGNOSIS — F411 Generalized anxiety disorder: Secondary | ICD-10-CM | POA: Diagnosis not present

## 2014-06-12 DIAGNOSIS — K5900 Constipation, unspecified: Secondary | ICD-10-CM | POA: Diagnosis not present

## 2014-06-12 DIAGNOSIS — M1990 Unspecified osteoarthritis, unspecified site: Secondary | ICD-10-CM | POA: Diagnosis not present

## 2014-06-12 DIAGNOSIS — T8189XA Other complications of procedures, not elsewhere classified, initial encounter: Secondary | ICD-10-CM | POA: Diagnosis not present

## 2014-06-12 DIAGNOSIS — G8918 Other acute postprocedural pain: Secondary | ICD-10-CM | POA: Diagnosis not present

## 2014-06-12 DIAGNOSIS — E785 Hyperlipidemia, unspecified: Secondary | ICD-10-CM | POA: Diagnosis not present

## 2014-06-12 DIAGNOSIS — S79919A Unspecified injury of unspecified hip, initial encounter: Secondary | ICD-10-CM | POA: Diagnosis not present

## 2014-06-12 DIAGNOSIS — Z91040 Latex allergy status: Secondary | ICD-10-CM | POA: Diagnosis not present

## 2014-06-12 DIAGNOSIS — Z96641 Presence of right artificial hip joint: Secondary | ICD-10-CM | POA: Diagnosis not present

## 2014-06-12 DIAGNOSIS — R1084 Generalized abdominal pain: Secondary | ICD-10-CM | POA: Diagnosis present

## 2014-06-12 LAB — TYPE AND SCREEN
ABO/RH(D): A POS
Antibody Screen: NEGATIVE
Unit division: 0
Unit division: 0
Unit division: 0
Unit division: 0

## 2014-06-12 LAB — CBC WITH DIFFERENTIAL/PLATELET
BASOS PCT: 0 % (ref 0–1)
Basophils Absolute: 0 10*3/uL (ref 0.0–0.1)
Eosinophils Absolute: 0.4 10*3/uL (ref 0.0–0.7)
Eosinophils Relative: 5 % (ref 0–5)
HEMATOCRIT: 22 % — AB (ref 36.0–46.0)
Hemoglobin: 7 g/dL — ABNORMAL LOW (ref 12.0–15.0)
LYMPHS ABS: 2 10*3/uL (ref 0.7–4.0)
LYMPHS PCT: 25 % (ref 12–46)
MCH: 26.8 pg (ref 26.0–34.0)
MCHC: 31.8 g/dL (ref 30.0–36.0)
MCV: 84.3 fL (ref 78.0–100.0)
MONO ABS: 0.8 10*3/uL (ref 0.1–1.0)
MONOS PCT: 10 % (ref 3–12)
Neutro Abs: 4.9 10*3/uL (ref 1.7–7.7)
Neutrophils Relative %: 60 % (ref 43–77)
Platelets: 209 10*3/uL (ref 150–400)
RBC: 2.61 MIL/uL — AB (ref 3.87–5.11)
RDW: 16.6 % — ABNORMAL HIGH (ref 11.5–15.5)
WBC: 8.2 10*3/uL (ref 4.0–10.5)

## 2014-06-12 LAB — GLUCOSE, CAPILLARY
GLUCOSE-CAPILLARY: 136 mg/dL — AB (ref 70–99)
GLUCOSE-CAPILLARY: 183 mg/dL — AB (ref 70–99)

## 2014-06-12 LAB — BASIC METABOLIC PANEL
Anion gap: 12 (ref 5–15)
BUN: 10 mg/dL (ref 6–23)
CALCIUM: 9.2 mg/dL (ref 8.4–10.5)
CHLORIDE: 104 meq/L (ref 96–112)
CO2: 26 meq/L (ref 19–32)
Creatinine, Ser: 0.82 mg/dL (ref 0.50–1.10)
GFR calc Af Amer: 88 mL/min — ABNORMAL LOW (ref >90)
GFR calc non Af Amer: 76 mL/min — ABNORMAL LOW (ref >90)
Glucose, Bld: 158 mg/dL — ABNORMAL HIGH (ref 70–99)
Potassium: 4 mEq/L (ref 3.7–5.3)
SODIUM: 142 meq/L (ref 137–147)

## 2014-06-12 LAB — HIV ANTIBODY (ROUTINE TESTING W REFLEX): HIV 1&2 Ab, 4th Generation: NONREACTIVE

## 2014-06-12 MED ORDER — OXYCODONE-ACETAMINOPHEN 5-325 MG PO TABS: 1.0000 | ORAL_TABLET | ORAL | Status: DC | PRN

## 2014-06-12 MED ORDER — HEPARIN SOD (PORK) LOCK FLUSH 100 UNIT/ML IV SOLN
250.0000 [IU] | INTRAVENOUS | Status: AC | PRN
  Administered 2014-06-12: 250 [IU]

## 2014-06-12 MED ORDER — LEVOFLOXACIN 750 MG PO TABS: 750.0000 mg | ORAL_TABLET | Freq: Every day | ORAL | Status: DC

## 2014-06-12 MED ORDER — VANCOMYCIN HCL 10 G IV SOLR: 1250.0000 mg | Freq: Two times a day (BID) | INTRAVENOUS | Status: DC

## 2014-06-12 NOTE — Clinical Social Work Note (Signed)
Patient to discharge today to [facility] RN to call report to: [phone] Transportation: [facility] (Facility ready after lunch).  RN aware.  Vickii PennaGina Darrek Leasure, LCSWA [phone]  Psychiatric & Orthopedics (5N 1-16) Clinical Social Worker

## 2014-06-12 NOTE — Progress Notes (Signed)
INFECTIOUS DISEASE PROGRESS NOTE  ID: Kelly Richardson is a 63 y.o. female with  Active Problems:   Status post revision of total hip replacement  Subjective: N/v after having PIC flushed (with NS)  Abtx:  Anti-infectives    Start     Dose/Rate Route Frequency Ordered Stop   06/12/14 0000  levofloxacin (LEVAQUIN) 750 MG tablet     750 mg Oral Daily 06/12/14 0706     06/12/14 0000  vancomycin 1,250 mg in sodium chloride 0.9 % 250 mL     1,250 mg166.7 mL/hr over 90 Minutes Intravenous Every 12 hours 06/12/14 0706     06/11/14 1715  levofloxacin (LEVAQUIN) tablet 750 mg     750 mg Oral Daily 06/11/14 1611     06/10/14 0930  vancomycin (VANCOCIN) 1,250 mg in sodium chloride 0.9 % 250 mL IVPB    Comments:  Pharmacy to dose treatment for IV vancomycin for at least 6 weeks   1,250 mg166.7 mL/hr over 90 Minutes Intravenous Every 12 hours 06/10/14 0801 07/23/14 2129   06/09/14 0600  vancomycin (VANCOCIN) IVPB 1000 mg/200 mL premix     1,000 mg200 mL/hr over 60 Minutes Intravenous On call to O.R. 06/08/14 1421 06/08/14 1754   06/08/14 2130  vancomycin (VANCOCIN) IVPB 1000 mg/200 mL premix  Status:  Discontinued    Comments:  Pharmacy to dose treatment for IV vancomycin for at least 6 weeks   1,000 mg200 mL/hr over 60 Minutes Intravenous Every 12 hours 06/08/14 2114 06/10/14 0801   06/08/14 1738  gentamicin (GARAMYCIN) injection  Status:  Discontinued       As needed 06/08/14 1738 06/08/14 1847   06/08/14 1738  vancomycin (VANCOCIN) powder  Status:  Discontinued       As needed 06/08/14 1739 06/08/14 1847      Medications:  Scheduled: . aspirin EC  325 mg Oral Q breakfast  . ferrous sulfate  325 mg Oral TID PC  . hydrochlorothiazide  12.5 mg Oral Daily  . insulin aspart  4 Units Subcutaneous TID WC  . insulin detemir  10 Units Subcutaneous QHS  . levofloxacin  750 mg Oral Daily  . lisinopril  20 mg Oral Daily  . repaglinide  1 mg Oral TID AC  . sodium chloride  10-40 mL  Intracatheter Q12H  . vancomycin  1,250 mg Intravenous Q12H    Objective: Vital signs in last 24 hours: Temp:  [98.1 F (36.7 C)-99.2 F (37.3 C)] 98.1 F (36.7 C) (12/08 1201) Pulse Rate:  [63-113] 95 (12/08 1201) Resp:  [14-18] 18 (12/08 1201) BP: (100-135)/(53-76) 111/53 mmHg (12/08 1201) SpO2:  [93 %-98 %] 97 % (12/08 1201)   General appearance: alert and mild distress Resp: clear to auscultation bilaterally Cardio: regular rate and rhythm GI: normal findings: bowel sounds normal and soft, non-tender Incision/Wound: hip wound is clean, no erythema or d/c.   Lab Results  Recent Labs  06/11/14 0600 06/12/14 0340 06/12/14 0605  WBC 9.0 8.2  --   HGB 7.3* 7.0*  --   HCT 23.0* 22.0*  --   NA 140  --  142  K 3.6*  --  4.0  CL 100  --  104  CO2 25  --  26  BUN 7  --  10  CREATININE 0.74  --  0.82   Liver Panel No results for input(s): PROT, ALBUMIN, AST, ALT, ALKPHOS, BILITOT, BILIDIR, IBILI in the last 72 hours. Sedimentation Rate  Recent Labs  06/11/14  1750  ESRSEDRATE 105*   C-Reactive Protein  Recent Labs  06/11/14 1750  CRP 9.3*    Microbiology: Recent Results (from the past 240 hour(s))  Anaerobic culture     Status: None (Preliminary result)   Collection Time: 06/08/14  5:51 PM  Result Value Ref Range Status   Specimen Description ABSCESS RIGHT HIP  Final   Special Requests PATIENT ON FOLLOWING DOXYCYCLINE  Final   Gram Stain   Final    NO WBC SEEN NO SQUAMOUS EPITHELIAL CELLS SEEN NO ORGANISMS SEEN [facility]formed [facility]    Culture   Final    NO ANAEROBES ISOLATED; CULTURE IN PROGRESS FOR 5 DAYS Performed at [facility]    Report Status PENDING  Incomplete  Culture, routine-abscess     Status: None   Collection Time: 06/08/14  5:51 PM  Result Value Ref Range Status   Specimen Description ABSCESS RIGHT HIP  Final   Special Requests PATIENT ON FOLLOWING DOXYCYCLINE  Final   Gram Stain   Final    NO WBC SEEN NO  SQUAMOUS EPITHELIAL CELLS SEEN NO ORGANISMS SEEN Performed at [facility]    Culture   Final    NO GROWTH 3 DAYS Performed at [facility]    Report Status 06/11/2014 FINAL  Final    Studies/Results: No results found.   Assessment/Plan: Osteomyelitis R hip- Cx (-) DM2  Total days of antibiotics: 5 vanco/levaquin Would aim for 6 weeks of rx Not celar what n/v, dizziness are from. Not related to vanco dose.  She will need to h ave her levaquin dose separated from her iron pills by 2 hours.  Will see her in ID clinic for f/u         Johny SaxJeffrey Jeanee Fabre Infectious Diseases (pager) [phone] [email or web address] 06/12/2014, 3:52 PM  LOS: 4 days

## 2014-06-12 NOTE — Progress Notes (Signed)
CARE MANAGEMENT NOTE 06/12/2014  Patient:  Kelly Richardson,Kelly Richardson   Account Number:  [id number]  Date Initiated:  06/09/2014  Documentation initiated by:  ADDISON,LAWANDA  Subjective/Objective Assessment:   62 yr old woman, s/p I +D of rt hip w/ revision of cup and abt bead placement.  Has rolling walker.     Action/Plan:   Pt to get PICC line for longterm antibiotics.  Will need shortterm rehab has no home support   Anticipated DC Date:  06/12/2014   Anticipated DC Plan:  SKILLED NURSING FACILITY  In-house referral  Clinical Social Worker         Choice offered to / List presented to:             Status of service:  Completed, signed off Medicare Important Message given?  YES (If response is "NO", the following Medicare IM given date fields will be blank) Date Medicare IM given:  06/12/2014 Medicare IM given by:  Donold Marotto Date Additional Medicare IM given:   Additional Medicare IM given by:    Discharge Disposition:  SKILLED NURSING FACILITY  

## 2014-06-12 NOTE — Discharge Summary (Signed)
Physician Discharge Summary  Patient ID: Kelly Richardson MRN: [id number] DOB/AGE: 1950-10-23 63 y.o.  Admit date: 06/08/2014 Discharge date: 06/12/2014  Admission Diagnoses: Infected right total hip arthroplasty  Discharge Diagnoses:  Active Problems:   Status post revision of total hip replacement   Discharged Condition: stable  Hospital Course: Patient's hospital course was essentially unremarkable. She was admitted underwent revision of the total hip arthroplasty and placement of antibiotic beads. Infectious disease was consulted. Patient did have a decrease in her hemoglobin but did not want a blood transfusion. Patient was discharged on vancomycin and Levaquin.  Consults: ID  Significant Diagnostic Studies: labs: Routine labssurgery: And placement of PICC line  Treatments: surgery: See operative note  Discharge Exam: Blood pressure 100/60, pulse 89, temperature 98.6 F (37 C), temperature source Oral, resp. rate 14, height 5\' 1"  (1.549 m), weight 108.092 kg (238 lb 4.8 oz), SpO2 93 %. Incision/Wound: dressing clean and dry  Disposition: 01-Home or Self Care  Discharge Instructions    Call MD / Call 911    Complete by:  As directed   If you experience chest pain or shortness of breath, CALL 911 and be transported to the hospital emergency room.  If you develope a fever above 101 F, pus (white drainage) or increased drainage or redness at the wound, or calf pain, call your surgeon's office.     Constipation Prevention    Complete by:  As directed   Drink plenty of fluids.  Prune juice may be helpful.  You may use a stool softener, such as Colace (over the counter) 100 mg twice a day.  Use MiraLax (over the counter) for constipation as needed.     Diet - low sodium heart healthy    Complete by:  As directed      Increase activity slowly as tolerated    Complete by:  As directed             Medication List    TAKE these medications        aspirin EC 81 MG tablet   Take 81 mg by mouth daily.     atorvastatin 20 MG tablet  Commonly known as:  LIPITOR  Take 20 mg by mouth at bedtime.     doxycycline 100 MG capsule  Commonly known as:  VIBRAMYCIN  Take 100 mg by mouth 2 (two) times daily.     hydrocortisone cream 1 %  Apply 1 application topically 2 (two) times daily as needed (FOR RASH).     LEVEMIR FLEXTOUCH 100 UNIT/ML Pen  Generic drug:  Insulin Detemir  Inject 45 Units into the skin at bedtime.     levofloxacin 750 MG tablet  Commonly known as:  LEVAQUIN  Take 1 tablet (750 mg total) by mouth daily.     lisinopril-hydrochlorothiazide 20-12.5 MG per tablet  Commonly known as:  PRINZIDE,ZESTORETIC  Take 1 tablet by mouth daily.     mupirocin cream 2 %  Commonly known as:  BACTROBAN  Apply 1 application topically as needed (Hip infection).     NOVOLOG FLEXPEN 100 UNIT/ML FlexPen  Generic drug:  insulin aspart  Inject 8 Units into the skin 3 (three) times daily with meals.     oxyCODONE-acetaminophen 5-325 MG per tablet  Commonly known as:  ROXICET  Take 1 tablet by mouth every 4 (four) hours as needed for severe pain.     repaglinide 1 MG tablet  Commonly known as:  PRANDIN  Take 1 tablet (1  mg total) by mouth 3 (three) times daily before meals.     Tavaborole 5 % Soln  Commonly known as:  KERYDIN  Apply 1 drop each affected toenail once daily for 12 months as instructed     vancomycin 1,250 mg in sodium chloride 0.9 % 250 mL  Inject 1,250 mg into the vein every 12 (twelve) hours.           Follow-up Information    Follow up with Kelly Basu V, MD In 1 week.   Specialty:  Orthopedic Surgery   Contact information:   [address] [place] [address] [phone]       Signed: [name],Kelly Richardson 06/12/2014, 7:07 AM

## 2014-06-12 NOTE — Progress Notes (Signed)
Report called to [facility]. RN Caprice Beaveranielle Scott spoke with RN at facility. Pt awaiting flush/Cap of PICC line and then will be ready for transport via [facility]. [facility].

## 2014-06-12 NOTE — Progress Notes (Addendum)
Patient ID: Kelly Richardson, female   DOB: July 13, 1950, 63 y.o.   MRN: [id number] Postoperative day 4 status post irrigation and debridement placement of antibiotic beads and revision for infected total hip arthroplasty. Plan for discharge to skilled nursing. FL-2  signed.  Thank you for infectious disease consult. Cultures remain negative.  Patient hemoglobin drop slightly to 7.0. Patient does appear symptomatic from the low hemoglobin however she states that she does not want any blood products.  Paperwork completed for discharge.

## 2014-06-12 NOTE — Anesthesia Postprocedure Evaluation (Signed)
  Anesthesia Post-op Note  Patient: Kelly Richardson  Procedure(s) Performed: Procedure(s): Irrigation and Debridement Right Hip, Revision of Cup and Head of Total Hip with  Placement  of Antibiotic Spacer (Right)  Patient discharged with no apparent anesthetic complications

## 2014-06-12 NOTE — Plan of Care (Signed)
Problem: Phase III Progression Outcomes Goal: Pain controlled on oral analgesia Outcome: Completed/Met Date Met:  06/12/14 Goal: Voiding independently Outcome: Completed/Met Date Met:  06/12/14 Goal: Nasogastric tube discontinued Outcome: Not Applicable Date Met:  02/63/78 Goal: Discharge plan remains appropriate-arrangements made Outcome: Completed/Met Date Met:  06/12/14 Goal: Demonstrates TCDB, IS independently Outcome: Completed/Met Date Met:  06/12/14

## 2014-06-12 NOTE — Progress Notes (Signed)
Physical Therapy Treatment Patient Details Name: Kelly Richardson MRN: [id number] DOB: 19-Aug-1950 Today's Date: 06/12/2014    History of Present Illness 63 y.o. female with a diagnosis of Infected Right Total Hip Arthroplasty; pt s/p Rt THA revision.     PT Comments    Session focused on therex and muscle activation around the hip, Pt did become nauseated during the session; ultimately, we opted not to get up as she was quite dizzy yesterday, and noted her Hgb is even lower today;   Continue to recommend SNF fo rpost-acute rehab  Follow Up Recommendations  SNF;Supervision/Assistance - 24 hour     Equipment Recommendations  None recommended by PT    Recommendations for Other Services       Precautions / Restrictions Precautions Precautions: Posterior Hip;Fall Precaution Booklet Issued: Yes (comment) Precaution Comments: Reviewed post hip prec; continues to need reinforcement Restrictions Weight Bearing Restrictions: Yes RLE Weight Bearing: Weight bearing as tolerated    Mobility  Bed Mobility                  Transfers                    Ambulation/Gait                 Stairs            Wheelchair Mobility    Modified Rankin (Stroke Patients Only)       Balance                                    Cognition Arousal/Alertness: Awake/alert Behavior During Therapy: WFL for tasks assessed/performed Overall Cognitive Status: Within Functional Limits for tasks assessed       Memory: Decreased recall of precautions              Exercises Total Joint Exercises Quad Sets: AROM;Right;20 reps Gluteal Sets: AROM;Both;10 reps Towel Squeeze: AROM;10 reps;Right Short Arc Quad: AROM;Right;10 reps Heel Slides: AAROM;Right;10 reps Hip ABduction/ADduction: AAROM;Right;10 reps    General Comments        Pertinent Vitals/Pain Pain Assessment: Faces Faces Pain Scale: Hurts little more Pain Location: R hip with heel  slides Pain Descriptors / Indicators: Aching Pain Intervention(s): Limited activity within patient's tolerance;Monitored during session;Repositioned    Home Living                      Prior Function            PT Goals (current goals can now be found in the care plan section) Acute Rehab PT Goals Patient Stated Goal: to go get rehab then home PT Goal Formulation: With patient Time For Goal Achievement: 06/16/14 Potential to Achieve Goals: Good Progress towards PT goals: Progressing toward goals    Frequency  7X/week    PT Plan Current plan remains appropriate    Co-evaluation             End of Session   Activity Tolerance: Patient tolerated treatment well;Other (comment) (though nausea with dry heaves during session) Patient left: in bed;with call bell/phone within reach     Time: 1042-1102 PT Time Calculation (min) (ACUTE ONLY): 20 min  Charges:  $Therapeutic Exercise: 8-22 mins                    G Codes:      Van ClinesGarrigan, Zebediah Beezley [place]  06/12/2014, 11:32 AM  Van ClinesHolly Jumanah Hynson, PT  Acute Rehabilitation Services Pager [phone] Office [phone]

## 2014-06-13 ENCOUNTER — Encounter (HOSPITAL_COMMUNITY): Payer: Self-pay | Admitting: Emergency Medicine

## 2014-06-13 ENCOUNTER — Emergency Department (HOSPITAL_COMMUNITY)
Admission: EM | Admit: 2014-06-13 | Discharge: 2014-06-13 | Disposition: A | Discharge status: Home / Self Care | Payer: Medicare Other | Attending: Emergency Medicine | Admitting: Emergency Medicine

## 2014-06-13 DIAGNOSIS — M25551 Pain in right hip: Secondary | ICD-10-CM

## 2014-06-13 DIAGNOSIS — Z5189 Encounter for other specified aftercare: Secondary | ICD-10-CM

## 2014-06-13 DIAGNOSIS — G8918 Other acute postprocedural pain: Secondary | ICD-10-CM | POA: Diagnosis not present

## 2014-06-13 DIAGNOSIS — Z880 Allergy status to penicillin: Secondary | ICD-10-CM | POA: Insufficient documentation

## 2014-06-13 DIAGNOSIS — Z792 Long term (current) use of antibiotics: Secondary | ICD-10-CM | POA: Insufficient documentation

## 2014-06-13 DIAGNOSIS — R011 Cardiac murmur, unspecified: Secondary | ICD-10-CM | POA: Insufficient documentation

## 2014-06-13 DIAGNOSIS — E119 Type 2 diabetes mellitus without complications: Secondary | ICD-10-CM | POA: Insufficient documentation

## 2014-06-13 DIAGNOSIS — Z91040 Latex allergy status: Secondary | ICD-10-CM | POA: Insufficient documentation

## 2014-06-13 DIAGNOSIS — Z4801 Encounter for change or removal of surgical wound dressing: Secondary | ICD-10-CM | POA: Insufficient documentation

## 2014-06-13 DIAGNOSIS — I10 Essential (primary) hypertension: Secondary | ICD-10-CM | POA: Insufficient documentation

## 2014-06-13 DIAGNOSIS — Z79899 Other long term (current) drug therapy: Secondary | ICD-10-CM | POA: Insufficient documentation

## 2014-06-13 DIAGNOSIS — M1990 Unspecified osteoarthritis, unspecified site: Secondary | ICD-10-CM | POA: Insufficient documentation

## 2014-06-13 DIAGNOSIS — Z7982 Long term (current) use of aspirin: Secondary | ICD-10-CM | POA: Insufficient documentation

## 2014-06-13 DIAGNOSIS — Z96649 Presence of unspecified artificial hip joint: Secondary | ICD-10-CM | POA: Insufficient documentation

## 2014-06-13 LAB — I-STAT CHEM 8, ED
BUN: 8 mg/dL (ref 6–23)
CALCIUM ION: 1.23 mmol/L (ref 1.13–1.30)
Chloride: 102 mEq/L (ref 96–112)
Creatinine, Ser: 0.9 mg/dL (ref 0.50–1.10)
Glucose, Bld: 153 mg/dL — ABNORMAL HIGH (ref 70–99)
HEMATOCRIT: 24 % — AB (ref 36.0–46.0)
Hemoglobin: 8.2 g/dL — ABNORMAL LOW (ref 12.0–15.0)
Potassium: 3.3 mEq/L — ABNORMAL LOW (ref 3.7–5.3)
Sodium: 139 mEq/L (ref 137–147)
TCO2: 23 mmol/L (ref 0–100)

## 2014-06-13 LAB — CBC WITH DIFFERENTIAL/PLATELET
Basophils Absolute: 0 10*3/uL (ref 0.0–0.1)
Basophils Relative: 0 % (ref 0–1)
EOS PCT: 4 % (ref 0–5)
Eosinophils Absolute: 0.3 10*3/uL (ref 0.0–0.7)
HCT: 25.3 % — ABNORMAL LOW (ref 36.0–46.0)
Hemoglobin: 8.1 g/dL — ABNORMAL LOW (ref 12.0–15.0)
LYMPHS PCT: 23 % (ref 12–46)
Lymphs Abs: 2.1 10*3/uL (ref 0.7–4.0)
MCH: 27 pg (ref 26.0–34.0)
MCHC: 32 g/dL (ref 30.0–36.0)
MCV: 84.3 fL (ref 78.0–100.0)
Monocytes Absolute: 1 10*3/uL (ref 0.1–1.0)
Monocytes Relative: 11 % (ref 3–12)
NEUTROS PCT: 63 % (ref 43–77)
Neutro Abs: 6 10*3/uL (ref 1.7–7.7)
Platelets: 282 10*3/uL (ref 150–400)
RBC: 3 MIL/uL — ABNORMAL LOW (ref 3.87–5.11)
RDW: 16.7 % — AB (ref 11.5–15.5)
WBC: 9.5 10*3/uL (ref 4.0–10.5)

## 2014-06-13 LAB — ANAEROBIC CULTURE: Gram Stain: NONE SEEN

## 2014-06-13 LAB — LIPASE, BLOOD: LIPASE: 21 U/L (ref 11–59)

## 2014-06-13 MED ORDER — ONDANSETRON HCL 4 MG/2ML IJ SOLN
4.0000 mg | Freq: Once | INTRAMUSCULAR | Status: AC
  Administered 2014-06-13: 4 mg via INTRAVENOUS
  Filled 2014-06-13: qty 2

## 2014-06-13 MED ORDER — HYDROMORPHONE HCL 2 MG PO TABS: 2.0000 mg | ORAL_TABLET | ORAL | Status: DC | PRN

## 2014-06-13 MED ORDER — SODIUM CHLORIDE 0.9 % IV BOLUS (SEPSIS)
500.0000 mL | Freq: Once | INTRAVENOUS | Status: AC
  Administered 2014-06-13: 500 mL via INTRAVENOUS

## 2014-06-13 MED ORDER — HYDROMORPHONE HCL 1 MG/ML IJ SOLN
1.0000 mg | Freq: Once | INTRAMUSCULAR | Status: DC
  Filled 2014-06-13: qty 1

## 2014-06-13 NOTE — Discharge Instructions (Signed)
Your wound will continue to drain because of the implanted antibiotic beads. We are changing your pain medicine from oxycodone, to hydromorphone. Take it every 4 hours as needed for pain.  You can begin taking it immediately.    Wound Check Your wound appears healthy today. Your wound will heal gradually over time. Eventually a scar will form that will fade with time. FACTORS THAT AFFECT SCAR FORMATION:  People differ in the severity in which they scar.  Scar severity varies according to location, size, and the traits you inherited from your parents (genetic predisposition).  Irritation to the wound from infection, rubbing, or chemical exposure will increase the amount of scar formation. HOME CARE INSTRUCTIONS   If you were given a dressing, you should change it at least once a day or as instructed by your caregiver. If the bandage sticks, soak it off with a solution of hydrogen peroxide.  If the bandage becomes wet, dirty, or develops a bad smell, change it as soon as possible.  Look for signs of infection.  Only take over-the-counter or prescription medicines for pain, discomfort, or fever as directed by your caregiver. SEEK IMMEDIATE MEDICAL CARE IF:   You have redness, swelling, or increasing pain in the wound.  You notice pus coming from the wound.  You have a fever.  You notice a bad smell coming from the wound or dressing. Document Released: 03/28/2004 Document Revised: 09/14/2011 Document Reviewed: 06/22/2005 [facility] Patient Information 2015 [employer] [place]. This information is not intended to replace advice given to you by your health care provider. Make sure you discuss any questions you have with your health care provider.

## 2014-06-13 NOTE — ED Notes (Signed)
Increase in right hip pain and muscle spasms since a bandage change this morning to right hip. Had hip replacement on Friday; discharged to rehab facility last night. Bloody drainage since surgery requiring frequent dressing changes. Before d/c hgb was low, but she refused transfusion she states. Given percocet at 0745 at rehab; no relief yet. Patient is most concerned about severe pain. Staff at rehab is concerned about constant bloody drainage and redness at site.

## 2014-06-13 NOTE — ED Notes (Signed)
[facility] has arrived for transport. Report given.

## 2014-06-13 NOTE — ED Notes (Signed)
Placed pt on bed pan, pt was unable to urinate. Took pt off of bedpan, let pt know to use call light when she is able to provide urine

## 2014-06-13 NOTE — ED Notes (Signed)
Offered pain medicine again; patient states at this time the pain is fine and she doesn't need anything else.

## 2014-06-13 NOTE — ED Provider Notes (Signed)
CSN: [id number]     Arrival date & time 06/13/14  4098 History   First MD Initiated Contact with Patient 06/13/14 0919     Chief Complaint  Patient presents with  . Hip Pain     (Consider location/radiation/quality/duration/timing/severity/associated sxs/prior Treatment) HPI   Kelly Richardson is a 63 y.o. female who presents for evaluation of "searing pain" right hip.  She describes the pain as 20 on a scale of 10.  She states that she is using the prescribed medications, without relief.  She was discharged from the hospital yesterday after treated for revision hip surgery and infected hip prosthesis.  She states that she is having bloody drainage from the right hip wound.  She does not know if she has had a fever.  There are no other known modifying factors.   Past Medical History  Diagnosis Date  . Diabetes mellitus without complication   . Complication of anesthesia   . PONV (postoperative nausea and vomiting)   . Hypertension   . Heart murmur     as a child  . 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 Shortness of breath dyspnea   . Arthritis    Past Surgical History  Procedure Laterality Date  . Total hip arthroplasty Right 2011  . Abdominal hysterectomy    . Cholecystectomy  1995  . Tonsillectomy    . Incision and drainage hip Right 06/08/2014    Procedure: Irrigation and Debridement Right Hip, Revision of Cup and Head of Total Hip with  Placement  of Antibiotic Spacer;  Surgeon: Nadara Mustard, MD;  Location: [facility] OR;  Service: Orthopedics;  Laterality: Right;   History reviewed. No pertinent family history. History  Substance Use Topics  . Smoking status: Never Smoker   . Smokeless tobacco: Not on file  . Alcohol Use: No   OB History    No data available     Review of Systems  All other systems reviewed and are negative.     Allergies  Penicillins; Seldane; Sulfonamide derivatives; Other; Iohexol; Latex; Metformin; and Xanax  Home  Medications   Prior to Admission medications   Medication Sig Start Date End Date Taking? Authorizing Provider  aspirin EC 81 MG tablet Take 81 mg by mouth daily.   Yes Historical Provider, MD  atorvastatin (LIPITOR) 20 MG tablet Take 20 mg by mouth at bedtime.  03/26/13  Yes Historical Provider, MD  doxycycline (VIBRAMYCIN) 100 MG capsule Take 100 mg by mouth 2 (two) times daily.   Yes Historical Provider, MD  LEVEMIR FLEXTOUCH 100 UNIT/ML SOPN Inject 45 Units into the skin at bedtime.  03/02/13  Yes Historical Provider, MD  levofloxacin (LEVAQUIN) 750 MG tablet Take 1 tablet (750 mg total) by mouth daily. 06/12/14  Yes Nadara Mustard, MD  lisinopril-hydrochlorothiazide (PRINZIDE,ZESTORETIC) 20-12.5 MG per tablet Take 1 tablet by mouth daily.  04/06/13  Yes Historical Provider, MD  mupirocin cream (BACTROBAN) 2 % Apply 1 application topically as needed (Hip infection).    Yes Historical Provider, MD  NOVOLOG FLEXPEN 100 UNIT/ML SOPN FlexPen Inject 8 Units into the skin 3 (three) times daily with meals.  03/29/13  Yes Historical Provider, MD  vancomycin 1,250 mg in sodium chloride 0.9 % 250 mL Inject 1,250 mg into the vein every 12 (twelve) hours. Patient taking differently: Inject 1,000 mg into the vein daily.  06/12/14  Yes Nadara Mustard, MD  ferrous sulfate 325 (65 FE) MG tablet Take  325 mg by mouth daily with breakfast.    Historical Provider, MD  HYDROmorphone (DILAUDID) 2 MG tablet Take 1 tablet (2 mg total) by mouth every 4 (four) hours as needed for moderate pain or severe pain (pain). 06/13/14   Flint MelterElliott L Shatona Andujar, MD  ondansetron (ZOFRAN ODT) 4 MG disintegrating tablet Take 1 tablet (4 mg total) by mouth every 8 (eight) hours as needed for nausea. 06/16/14   Vida RollerBrian D Miller, MD  oxyCODONE-acetaminophen (PERCOCET/ROXICET) 5-325 MG per tablet Take one tablet by mouth every four hours as needed for severe pain *Do not exceed 3gm APAP/24hour* 06/15/14   Shauna HughMonica Shamsid-Deen Carter, DO  repaglinide  (PRANDIN) 1 MG tablet Take 1 tablet (1 mg total) by mouth 3 (three) times daily before meals. 09/08/12   Ward GivensIva L Knapp, MD  vancomycin 1,000 mg in sodium chloride 0.9 % 250 mL Inject 1,000 mg into the vein daily.    Historical Provider, MD   BP 103/44 mmHg  Pulse 89  Temp(Src) 98.8 F (37.1 C) (Oral)  Resp 16  Ht 5\' 1"  (1.549 m)  Wt 224 lb (101.606 kg)  BMI 42.35 kg/m2  SpO2 99% Physical Exam  Constitutional: She is oriented to person, place, and time. She appears well-developed and well-nourished. She appears distressed (she appears uncomfortable.  She is coughing and retching.).  HENT:  Head: Normocephalic and atraumatic.  Right Ear: External ear normal.  Left Ear: External ear normal.  Eyes: Conjunctivae and EOM are normal. Pupils are equal, round, and reactive to light.  Neck: Normal range of motion and phonation normal. Neck supple.  Cardiovascular: Regular rhythm and normal heart sounds.   She is tachycardic.  Pulmonary/Chest: Effort normal and breath sounds normal. She exhibits no bony tenderness.  Abdominal: Soft. There is no tenderness.  Musculoskeletal: Normal range of motion.  Right lateral hip surgical wound is stapled shut.  There are some areas of overriding skin edges, and at the more distal portion of the wound.  There is an area with drainage of thin red liquid consistent with serous bloody drainage.  There is no associated fluctuance or erythema.  She has mild to moderate pain on passive range of motion of the right hip.  Neurological: She is alert and oriented to person, place, and time. No cranial nerve deficit or sensory deficit. She exhibits normal muscle tone. Coordination normal.  Skin: Skin is warm, dry and intact.  Psychiatric: Her behavior is normal. Judgment and thought content normal.  She is very anxious.  Nursing note and vitals reviewed.   ED Course  Procedures (including critical care time) After initial evaluation, patient had an episode of retching  and vomiting, producing usual stomach, type material.  Medications  sodium chloride 0.9 % bolus 500 mL (0 mLs Intravenous Stopped 06/13/14 1100)  ondansetron (ZOFRAN) injection 4 mg (4 mg Intravenous Given 06/13/14 0952)    No data found.   12:26 PM Reevaluation with update and discussion. After initial assessment and treatment, an updated evaluation reveals she feels better now.  She states, "I've been snoring".  She states that she wants to be on pain medicine regularly, until she leaves the rehabilitation facility.  We discussed the best use of pain medicine is as needed for pain.  I agreed to give her a more potent narcotic analgesia at discharge.   Findings discussed with patient and husband, all questions answered.[name]. Alicha Raspberry L    Labs Review Labs Reviewed  CBC WITH DIFFERENTIAL - Abnormal; Notable for the following:  RBC 3.00 (*)    Hemoglobin 8.1 (*)    HCT 25.3 (*)    RDW 16.7 (*)    All other components within normal limits  I-STAT CHEM 8, ED - Abnormal; Notable for the following:    Potassium 3.3 (*)    Glucose, Bld 153 (*)    Hemoglobin 8.2 (*)    HCT 24.0 (*)    All other components within normal limits  URINE CULTURE  LIPASE, BLOOD    Imaging Review Dg Abd Acute W/chest  06/16/2014   CLINICAL DATA:  Constipation, nausea, and vomiting for 1 week. Postoperative hip replacement on 06/08/2014.  EXAM: ACUTE ABDOMEN SERIES (ABDOMEN 2 VIEW & CHEST 1 VIEW)  COMPARISON:  Chest 04/20/2012  FINDINGS: Right-sided PICC catheter with tip over the low SVC region. No pneumothorax. Normal heart size and pulmonary vascularity. No focal airspace disease or consolidation in the lungs. No blunting of costophrenic angles. No pneumothorax. Mediastinal contours appear intact.  Scattered gas and stool in the colon. No small or large bowel distention. No free intra-abdominal air. No abnormal air-fluid levels. No radiopaque stones. Visualized bones appear intact. Surgical clips in the right  upper quadrant. Postoperative change in the right hip. Calcification in the left upper quadrant.  IMPRESSION: No evidence of active pulmonary disease. Nonobstructive bowel gas pattern.   Electronically Signed   By: Burman NievesWilliam  Stevens M.D.   On: 06/16/2014 07:02     EKG Interpretation None      MDM   Final diagnoses:  Hip pain, acute, right  Visit for wound check    Postoperative, right hip pain.  No evidence for fracture, worsening infection or radiculopathy.  Nursing Notes Reviewed/ Care Coordinated Applicable Imaging Reviewed Interpretation of Laboratory Data incorporated into ED treatment  The patient appears reasonably screened and/or stabilized for discharge and I doubt any other medical condition or other [facility] requiring further screening, evaluation, or treatment in the ED at this time prior to discharge.  Plan: Home Medications- Dilaudid; Home Treatments- rest; return here if the recommended treatment, does not improve the symptoms; Recommended follow up- PCP and Ortho prn    Flint MelterElliott L Kedron Uno, MD 06/17/14 2340

## 2014-06-15 ENCOUNTER — Non-Acute Institutional Stay (SKILLED_NURSING_FACILITY): Payer: Medicare Other | Admitting: Internal Medicine

## 2014-06-15 ENCOUNTER — Other Ambulatory Visit: Payer: Self-pay

## 2014-06-15 DIAGNOSIS — Z9660 Presence of unspecified orthopedic joint implant: Secondary | ICD-10-CM

## 2014-06-15 DIAGNOSIS — R0789 Other chest pain: Secondary | ICD-10-CM

## 2014-06-15 DIAGNOSIS — Z96649 Presence of unspecified artificial hip joint: Secondary | ICD-10-CM

## 2014-06-15 DIAGNOSIS — I10 Essential (primary) hypertension: Secondary | ICD-10-CM | POA: Diagnosis not present

## 2014-06-15 DIAGNOSIS — D500 Iron deficiency anemia secondary to blood loss (chronic): Secondary | ICD-10-CM | POA: Diagnosis not present

## 2014-06-15 MED ORDER — OXYCODONE-ACETAMINOPHEN 5-325 MG PO TABS: ORAL_TABLET | ORAL | Status: DC

## 2014-06-15 NOTE — Telephone Encounter (Signed)
[facility] 

## 2014-06-15 NOTE — Progress Notes (Signed)
Patient ID: Kelly Richardson, female   DOB: 1950-12-24, 63 y.o.   MRN: [id number]   This is an acute visit.  Level care skilled.  Facility [facility].  Chief complaint-acute visit secondary to chest pain.  History of present illness.  Patient is a pleasant 63 year old female here for rehabilitation after revision of a total hip replacement with placement of antibiotic beads-she continues on Levaquin as well as vancomycin routinely long-term.  Apparently earlier today she complained of a short episode of midsternal chest pain that apparently radiated initially to her left and then to her right shoulder area she said her arm shirt the left more than right-this lasted a proximally 5 minutes and resolved quite quickly after 1 nitroglycerin tablet.  I do not see specifically history coronary artery disease in her past-currently she is not complaining of any chest pain shoulder pain or arm pain-she denies any headache shortness of breath syncopal-type feelings or visual changes or abdominal discomfort during this episode.  She does continue on aspirin she is also on a statin.  She does have a history of hypertension continues on lisinopril-hydrochlorothiazide as well--she is also diabetic and continues on Levemir.  Family medical social history is been reviewed per discharge summary on 06/12/2014.  Medications have been reviewed per MAR.  Review of systems.  General does not complain of any fever or chills.  Skin is not complaining of any diaphoresis.  Head ears eyes nose mouth and throat-no visual changes or sore throat.  Respiratory does not complain of shortness of breath or cough.  Cardiac-again appear to have a short episode of midsternal chest pain that radiated to her shoulders and arms-this quickly resolved again with nitroglycerin 1.  GI-no complaints of nausea or vomiting or abdominal discomfort.  Musculoskeletal-is not complaining of joint pain currently.  Neurologic  again no dizziness or headache or syncopal-type feelings noted.  Psych she does have a significant history of anxiety.  Physical exam.  Temperature is 97.1 pulse 81 respirations 20 blood pressure 124/63 a 2-D take it manually and got 104/70.  In general this is a pleasant female in no distress resting comfortably in her bed.  Her skin is warm and dry she does have a PICC line of her right arm as well as a dressing over her left hip area.  Eyes pupils appear reactive to light sclerae and conjunctivae clear visual acuity appears grossly intact.  Oropharynx clear mucous membranes moist.  Chest is clear to auscultation there is no labored breathing.  Heart is regular rate and rhythm without murmur gallop or rub she has slight lower extremity edema.  Abdomen is obese soft nontender with positive bowel sounds.  Musculoskeletal-moves all extremities 4 I did not note any deformities again she does have limited movement of her left leg secondary to the recent hip revision.  Neurologic is grossly intact her speech is clear no lateralizing findings.  Psych she is alert and oriented pleasant and appropriate does have a history of significant anxiety at times per nursing.  Labs.  06/13/2014.  Sodium 139 potassium 3.3 BUN 8 creatinine 0.9-hemoglobin 8.2.  Assessment and plan.  #1-chest pain-this apparently quickly resolved as stated above-I do not see a significant coronary history although she does have a history of hypertension as well as diabetes-will order an EKG-also a stat CBC CMP as well as troponin level-continue to monitor closely with vital signs and pulse ox.  #2-history of anxiety-this might be contributing to her chest pain presentation earlier-at this point  will monitor she appears call and not overtly anxious currently.  #3 history of left hip revision-she has antibiotic beads is on long-term antibiotic with doxycycline Levaquin as well as vancomycin. At this point appear  stable she is afebrile.  #4-history of hypertension she is on lisinopril-hydrochlorothiazide recent blood pressures 104/70-124/63-90/60-at this point will monitor she appears to be stable.  #5-history of diabetes she is on Levemir recent hemoglobin A1c was 7.2 we'll continue to monitor.  Addendum.  We have obtain updated lab work which shows a white count of 10.7 hemoglobin 7.9 platelets 343.  Differential is within normal limits.  Sodium 139 potassium 4.3 BUN 16 creatinine 1.04.  AST 39-ALT 37 otherwise liver function tests within normal limits.  TSH was 3.256.  Troponin level was less than 0.01.  EKG was also obtained which does not really show anything acute coronary wise which is reassuring this was reviewed by Dr. Leanord Hawkingobson as well.  In regards to anemia oh appears her hemoglobin has dropped some I suspect this is postop-will start iron-also update a CBC next week-also guaiac stools 3.  Clinically she appears to be doing well will have to be monitored closely.  [id number]-of note greater than 45 minutes spent assessing patient-reassessing patient to ensure stability-reviewing her chart-and coordinating plan of care-of note greater than 50% of time spent coordinating plan of care

## 2014-06-16 ENCOUNTER — Emergency Department (HOSPITAL_COMMUNITY)
Admission: EM | Admit: 2014-06-16 | Discharge: 2014-06-16 | Disposition: A | Discharge status: Home / Self Care | Payer: Medicare Other | Attending: Emergency Medicine | Admitting: Emergency Medicine

## 2014-06-16 ENCOUNTER — Emergency Department (HOSPITAL_COMMUNITY): Payer: Medicare Other

## 2014-06-16 ENCOUNTER — Encounter (HOSPITAL_COMMUNITY): Payer: Self-pay | Admitting: Emergency Medicine

## 2014-06-16 DIAGNOSIS — I10 Essential (primary) hypertension: Secondary | ICD-10-CM | POA: Insufficient documentation

## 2014-06-16 DIAGNOSIS — Z792 Long term (current) use of antibiotics: Secondary | ICD-10-CM | POA: Insufficient documentation

## 2014-06-16 DIAGNOSIS — R112 Nausea with vomiting, unspecified: Secondary | ICD-10-CM | POA: Diagnosis not present

## 2014-06-16 DIAGNOSIS — R011 Cardiac murmur, unspecified: Secondary | ICD-10-CM | POA: Insufficient documentation

## 2014-06-16 DIAGNOSIS — K5900 Constipation, unspecified: Secondary | ICD-10-CM | POA: Insufficient documentation

## 2014-06-16 DIAGNOSIS — Z880 Allergy status to penicillin: Secondary | ICD-10-CM | POA: Insufficient documentation

## 2014-06-16 DIAGNOSIS — M1990 Unspecified osteoarthritis, unspecified site: Secondary | ICD-10-CM | POA: Insufficient documentation

## 2014-06-16 DIAGNOSIS — Z8669 Personal history of other diseases of the nervous system and sense organs: Secondary | ICD-10-CM | POA: Insufficient documentation

## 2014-06-16 DIAGNOSIS — Z91040 Latex allergy status: Secondary | ICD-10-CM | POA: Insufficient documentation

## 2014-06-16 DIAGNOSIS — Z794 Long term (current) use of insulin: Secondary | ICD-10-CM | POA: Insufficient documentation

## 2014-06-16 DIAGNOSIS — Z7982 Long term (current) use of aspirin: Secondary | ICD-10-CM | POA: Insufficient documentation

## 2014-06-16 DIAGNOSIS — E119 Type 2 diabetes mellitus without complications: Secondary | ICD-10-CM | POA: Insufficient documentation

## 2014-06-16 MED ORDER — GLYCERIN (LAXATIVE) 2.1 G RE SUPP
1.0000 | Freq: Once | RECTAL | Status: AC
  Administered 2014-06-16: 1 via RECTAL
  Filled 2014-06-16 (×2): qty 1

## 2014-06-16 MED ORDER — KETOROLAC TROMETHAMINE 30 MG/ML IJ SOLN
30.0000 mg | Freq: Once | INTRAMUSCULAR | Status: DC
  Filled 2014-06-16: qty 1

## 2014-06-16 MED ORDER — ONDANSETRON 4 MG PO TBDP
4.0000 mg | ORAL_TABLET | Freq: Once | ORAL | Status: AC
  Administered 2014-06-16: 4 mg via ORAL
  Filled 2014-06-16: qty 1

## 2014-06-16 MED ORDER — POLYETHYLENE GLYCOL 3350 17 G PO PACK
17.0000 g | PACK | Freq: Every day | ORAL | Status: DC
  Administered 2014-06-16: 17 g via ORAL
  Filled 2014-06-16: qty 1

## 2014-06-16 MED ORDER — ONDANSETRON HCL 4 MG/2ML IJ SOLN
4.0000 mg | Freq: Once | INTRAMUSCULAR | Status: DC
  Filled 2014-06-16: qty 2

## 2014-06-16 MED ORDER — DOCUSATE SODIUM 100 MG PO CAPS
100.0000 mg | ORAL_CAPSULE | Freq: Once | ORAL | Status: AC
  Administered 2014-06-16: 100 mg via ORAL
  Filled 2014-06-16: qty 1

## 2014-06-16 MED ORDER — DICYCLOMINE HCL 10 MG/ML IM SOLN
20.0000 mg | Freq: Once | INTRAMUSCULAR | Status: AC
  Administered 2014-06-16: 20 mg via INTRAMUSCULAR
  Filled 2014-06-16: qty 2

## 2014-06-16 MED ORDER — ONDANSETRON 4 MG PO TBDP: 4.0000 mg | ORAL_TABLET | Freq: Three times a day (TID) | ORAL | Status: DC | PRN

## 2014-06-16 NOTE — ED Notes (Addendum)
Tried to call facility x 2 at this number [phone]. Rella LarveW. York RN was given report.

## 2014-06-16 NOTE — ED Provider Notes (Signed)
CSN: [id number]     Arrival date & time 06/16/14  0250 History   First MD Initiated Contact with Patient 06/16/14 [phone]     Chief Complaint  Patient presents with  . Abdominal Pain  . Rectal Pain     (Consider location/radiation/quality/duration/timing/severity/associated sxs/prior Treatment) Patient is a 63 y.o. female presenting with abdominal pain. The history is provided by the patient.  Abdominal Pain Pain location:  Generalized (rectal) Pain quality: cramping   Pain radiates to:  Does not radiate Pain severity:  Severe Onset quality:  Gradual Timing:  Constant Progression:  Worsening Chronicity:  New Context: not alcohol use   Relieved by:  Nothing Worsened by:  Nothing tried Ineffective treatments:  None tried Associated symptoms: constipation   Associated symptoms: no anorexia   Risk factors: no alcohol abuse   Risk factors comment:  Recent surgery on narcotics   Past Medical History  Diagnosis Date  . Diabetes mellitus without complication   . Complication of anesthesia   . PONV (postoperative nausea and vomiting)   . Hypertension   . Heart murmur     as a child  . 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 Shortness of breath dyspnea   . Arthritis    Past Surgical History  Procedure Laterality Date  . Total hip arthroplasty Right 2011  . Abdominal hysterectomy    . Cholecystectomy  1995  . Tonsillectomy    . Incision and drainage hip Right 06/08/2014    Procedure: Irrigation and Debridement Right Hip, Revision of Cup and Head of Total Hip with  Placement  of Antibiotic Spacer;  Surgeon: Nadara MustardMarcus Duda V, MD;  Location: [facility] OR;  Service: Orthopedics;  Laterality: Right;   History reviewed. No pertinent family history. History  Substance Use Topics  . Smoking status: Never Smoker   . Smokeless tobacco: Not on file  . Alcohol Use: No   OB History    No data available     Review of Systems  Gastrointestinal: Positive for abdominal pain  and constipation. Negative for anorexia.  All other systems reviewed and are negative.     Allergies  Penicillins; Seldane; Sulfonamide derivatives; Other; Iohexol; Latex; Metformin; and Xanax  Home Medications   Prior to Admission medications   Medication Sig Start Date End Date Taking? Authorizing Provider  aspirin EC 81 MG tablet Take 81 mg by mouth daily.   Yes Historical Provider, MD  atorvastatin (LIPITOR) 20 MG tablet Take 20 mg by mouth at bedtime.  03/26/13  Yes Historical Provider, MD  doxycycline (VIBRAMYCIN) 100 MG capsule Take 100 mg by mouth 2 (two) times daily.   Yes Historical Provider, MD  ferrous sulfate 325 (65 FE) MG tablet Take 325 mg by mouth daily with breakfast.   Yes Historical Provider, MD  LEVEMIR FLEXTOUCH 100 UNIT/ML SOPN Inject 45 Units into the skin at bedtime.  03/02/13  Yes Historical Provider, MD  levofloxacin (LEVAQUIN) 750 MG tablet Take 1 tablet (750 mg total) by mouth daily. 06/12/14  Yes Nadara MustardMarcus Duda V, MD  lisinopril-hydrochlorothiazide (PRINZIDE,ZESTORETIC) 20-12.5 MG per tablet Take 1 tablet by mouth daily.  04/06/13  Yes Historical Provider, MD  mupirocin cream (BACTROBAN) 2 % Apply 1 application topically as needed (Hip infection).    Yes Historical Provider, MD  NOVOLOG FLEXPEN 100 UNIT/ML SOPN FlexPen Inject 8 Units into the skin 3 (three) times daily with meals.  03/29/13  Yes Historical Provider, MD  oxyCODONE-acetaminophen (PERCOCET/ROXICET) 5-325 MG per  tablet Take one tablet by mouth every four hours as needed for severe pain *Do not exceed 3gm APAP/24hour* 06/15/14  Yes Monica Caro LarocheShamsid-Deen Carter, DO  repaglinide (PRANDIN) 1 MG tablet Take 1 tablet (1 mg total) by mouth 3 (three) times daily before meals. 09/08/12  Yes Ward GivensIva L Knapp, MD  vancomycin 1,000 mg in sodium chloride 0.9 % 250 mL Inject 1,000 mg into the vein daily.   Yes Historical Provider, MD  HYDROmorphone (DILAUDID) 2 MG tablet Take 1 tablet (2 mg total) by mouth every 4 (four) hours as  needed for moderate pain or severe pain (pain). 06/13/14   Flint MelterElliott L Wentz, MD  vancomycin 1,250 mg in sodium chloride 0.9 % 250 mL Inject 1,250 mg into the vein every 12 (twelve) hours. Patient taking differently: Inject 1,000 mg into the vein daily.  06/12/14   Nadara MustardMarcus Duda V, MD   BP 98/51 mmHg  Pulse 99  Temp(Src) 97.9 F (36.6 C) (Oral)  Resp 20  SpO2 99% Physical Exam  Constitutional: She is oriented to person, place, and time. She appears well-developed and well-nourished. No distress.  HENT:  Head: Normocephalic and atraumatic.  Mouth/Throat: Oropharynx is clear and moist.  Eyes: Conjunctivae are normal. Pupils are equal, round, and reactive to light.  Neck: Normal range of motion. Neck supple.  Cardiovascular: Normal rate and regular rhythm.   Pulmonary/Chest: Effort normal and breath sounds normal. No respiratory distress. She has no wheezes. She has no rales.  Abdominal: Soft. Bowel sounds are increased. There is no tenderness. There is no rebound and no guarding.  Musculoskeletal: Normal range of motion.  Neurological: She is alert and oriented to person, place, and time.  Skin: Skin is warm and dry.  Psychiatric: She has a normal mood and affect.    ED Course  Procedures (including critical care time) Labs Review Labs Reviewed  CBC WITH DIFFERENTIAL  I-STAT CHEM 8, ED    Imaging Review No results found.   EKG Interpretation None      MDM   Final diagnoses:  Constipation    Passed large hard stool in the ED pain is now gone.  Abdomen is soft and non tender.  Still has hyperactive bowel sounds.   EDP will start bowel regimen, follow up with your family doctor on Monday.     Jasmine AweApril K Yurika Pereda-Rasch, MD 06/16/14 [phone]

## 2014-06-16 NOTE — Discharge Instructions (Signed)

## 2014-06-16 NOTE — ED Notes (Addendum)
Patient was disimpacted. Lubrication was used while patient was on the monitor. Patient stated she feels much better. She says here stomach doesn't hurt. She was also wet. Patient was cleaned up and linens changed.

## 2014-06-16 NOTE — ED Notes (Signed)
Patient is waiting on PTAR. 

## 2014-06-16 NOTE — ED Notes (Signed)
Patient was wet. Patient was cleaned and linens too.

## 2014-06-20 ENCOUNTER — Non-Acute Institutional Stay (SKILLED_NURSING_FACILITY): Payer: Medicare Other | Admitting: Internal Medicine

## 2014-06-20 ENCOUNTER — Encounter: Payer: Self-pay | Admitting: Internal Medicine

## 2014-06-20 DIAGNOSIS — I10 Essential (primary) hypertension: Secondary | ICD-10-CM

## 2014-06-20 DIAGNOSIS — D500 Iron deficiency anemia secondary to blood loss (chronic): Secondary | ICD-10-CM

## 2014-06-20 DIAGNOSIS — Z9660 Presence of unspecified orthopedic joint implant: Secondary | ICD-10-CM | POA: Diagnosis not present

## 2014-06-20 DIAGNOSIS — Z96649 Presence of unspecified artificial hip joint: Secondary | ICD-10-CM

## 2014-06-20 DIAGNOSIS — E785 Hyperlipidemia, unspecified: Secondary | ICD-10-CM | POA: Diagnosis not present

## 2014-06-20 DIAGNOSIS — E119 Type 2 diabetes mellitus without complications: Secondary | ICD-10-CM | POA: Diagnosis not present

## 2014-06-20 NOTE — Progress Notes (Signed)
MRN: [id number] Name: Kelly Richardson  Sex: female Age: 63 y.o. DOB: 12/24/1950  [facility] Level Of Care: SNF Provider: Merrilee SeashoreALEXANDER, Daymien Goth D Emergency Contacts: Extended Emergency Contact Information Primary Emergency Contact: Webb,Rick Address: [address]          [place], [place] [id number] [place] Home Phone: [phone] Mobile Phone: [phone] Relation: Significant other  Code Status: FULL  Allergies: Penicillins; Seldane; Sulfonamide derivatives; Other; Iohexol; Latex; Metformin; and Xanax  Chief Complaint  Patient presents with  . New Admit To SNF    HPI: Patient is 63 y.o. female who had septic hip replacement , [facility] and OT/PT.  Past Medical History  Diagnosis Date  . Diabetes mellitus without complication   . Complication of anesthesia   . PONV (postoperative nausea and vomiting)   . Hypertension   . Heart murmur     as a child  . 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 Shortness of breath dyspnea   . Arthritis     Past Surgical History  Procedure Laterality Date  . Total hip arthroplasty Right 2011  . Abdominal hysterectomy    . Cholecystectomy  1995  . Tonsillectomy    . Incision and drainage hip Right 06/08/2014    Procedure: Irrigation and Debridement Right Hip, Revision of Cup and Head of Total Hip with  Placement  of Antibiotic Spacer;  Surgeon: Nadara MustardMarcus Duda V, MD;  Location: [facility] OR;  Service: Orthopedics;  Laterality: Right;      Medication List       This list is accurate as of: 06/20/14  8:00 PM.  Always use your most recent med list.               aspirin EC 81 MG tablet  Take 81 mg by mouth daily.     atorvastatin 20 MG tablet  Commonly known as:  LIPITOR  Take 20 mg by mouth at bedtime.     doxycycline 100 MG capsule  Commonly known as:  VIBRAMYCIN  Take 100 mg by mouth 2 (two) times daily.     KERYDIN 5 %  Soln  Generic drug:  Tavaborole  Apply 1 drop topically daily.     LEVEMIR FLEXTOUCH 100 UNIT/ML Pen  Generic drug:  Insulin Detemir  Inject 45 Units into the skin at bedtime.     levofloxacin 750 MG tablet  Commonly known as:  LEVAQUIN  Take 1 tablet (750 mg total) by mouth daily.     lisinopril-hydrochlorothiazide 20-12.5 MG per tablet  Commonly known as:  PRINZIDE,ZESTORETIC  Take 1 tablet by mouth daily.     mupirocin cream 2 %  Commonly known as:  BACTROBAN  Apply 1 application topically as needed (Hip infection).     NOVOLOG FLEXPEN 100 UNIT/ML FlexPen  Generic drug:  insulin aspart  Inject 8 Units into the skin 3 (three) times daily with meals.     oxyCODONE-acetaminophen 5-325 MG per tablet  Commonly known as:  PERCOCET/ROXICET  Take one tablet by mouth every four hours as needed for severe pain *Do not exceed 3gm APAP/24hour*     repaglinide 1 MG tablet  Commonly known as:  PRANDIN  Take 1 tablet (1 mg total) by mouth 3 (three) times daily before meals.     vancomycin 1,250 mg in sodium chloride 0.9 % 250 mL  Inject 1,250 mg into the vein every  12 (twelve) hours.        Meds ordered this encounter  Medications  . Tavaborole (KERYDIN) 5 % SOLN    Sig: Apply 1 drop topically daily.    Immunization History  Administered Date(s) Administered  . Td 12/20/2006    History  Substance Use Topics  . Smoking status: Never Smoker   . Smokeless tobacco: Not on file  . Alcohol Use: No    Family history is noncontributory    Review of Systems  DATA OBTAINED: from patient GENERAL:  no fevers, fatigue, appetite changes SKIN: No itching, rash or wounds EYES: No eye pain, redness, discharge EARS: No earache, tinnitus, change in hearing NOSE: No congestion, drainage or bleeding  MOUTH/THROAT: No mouth or tooth pain, No sore throat RESPIRATORY: No cough, wheezing, SOB CARDIAC: No chest pain, palpitations, lower extremity edema  GI: No abdominal pain, No N/V/D  ; +constipation, No heartburn or reflux  GU: No dysuria, frequency or urgency, or incontinence  MUSCULOSKELETAL: No unrelieved bone/joint pain NEUROLOGIC: No headache, dizziness or focal weakness PSYCHIATRIC: No overt anxiety or sadness, No behavior issue.   Filed Vitals:   06/20/14 1751  BP: 120/74  Pulse: 70  Temp: 98.2 F (36.8 C)  Resp: 18    Physical Exam  GENERAL APPEARANCE: Alert, conversant,  No acute distress.  SKIN: No diaphoresis rash HEAD: Normocephalic, atraumatic  EYES: Conjunctiva/lids clear. Pupils round, reactive. EOMs intact.  EARS: External exam WNL, canals clear. Hearing grossly normal.  NOSE: No deformity or discharge.  MOUTH/THROAT: Lips w/o lesions  RESPIRATORY: Breathing is even, unlabored. Lung sounds are clear   CARDIOVASCULAR: Heart RRR no murmurs, rubs or gallops. No peripheral edema.   GASTROINTESTINAL: Abdomen is soft, non-tender, not distended w/ normal bowel sounds. GENITOURINARY: Bladder non tender, not distended  MUSCULOSKELETAL: No abnormal joints or musculature;incision looks good NEUROLOGIC:  Cranial nerves 2-12 grossly intact. Moves all extremities  PSYCHIATRIC: Mood and affect appropriate to situation, no behavioral issues  Patient Active Problem List   Diagnosis Date Noted  . Anemia due to blood loss 06/20/2014  . Status post revision of total hip replacement 06/08/2014  . ANXIETY 03/05/2008  . FATIGUE 03/05/2008  . Type 2 diabetes mellitus without complication 01/02/2008  . Hyperlipidemia 01/02/2008  . Essential hypertension 01/02/2008    CBC    Component Value Date/Time   WBC 9.5 06/13/2014 0948   RBC 3.00* 06/13/2014 0948   HGB 8.2* 06/13/2014 1004   HCT 24.0* 06/13/2014 1004   PLT 282 06/13/2014 0948   MCV 84.3 06/13/2014 0948   LYMPHSABS 2.1 06/13/2014 0948   MONOABS 1.0 06/13/2014 0948   EOSABS 0.3 06/13/2014 0948   BASOSABS 0.0 06/13/2014 0948    CMP     Component Value Date/Time   NA 139 06/13/2014 1004   K  3.3* 06/13/2014 1004   CL 102 06/13/2014 1004   CO2 26 06/12/2014 0605   GLUCOSE 153* 06/13/2014 1004   BUN 8 06/13/2014 1004   CREATININE 0.90 06/13/2014 1004   CALCIUM 9.2 06/12/2014 0605   PROT 8.5* 06/08/2014 1445   ALBUMIN 3.9 06/08/2014 1445   AST 64* 06/08/2014 1445   ALT 73* 06/08/2014 1445   ALKPHOS 106 06/08/2014 1445   BILITOT 0.6 06/08/2014 1445   GFRNONAA 76* 06/12/2014 0605   GFRAA 88* 06/12/2014 0605    Assessment and Plan  Status post revision of total hip replacement revision of the total hip arthroplasty and placement of antibiotic beads. Infectious disease was consulted. Patient was  discharged on vancomycin and Levaquin. For 8 weeks  Anemia due to blood loss Hb to 8.2, did not require tx  Essential hypertension Continue zestoretic  Type 2 diabetes mellitus without complication A1c 7.2 on long acting , novolog,prandin;on ACE and statin  Hyperlipidemia Continue lipitor    Margit Hanks, MD

## 2014-06-20 NOTE — Assessment & Plan Note (Signed)
Continue zestoretic

## 2014-06-20 NOTE — Assessment & Plan Note (Signed)
A1c 7.2 on long acting , novolog,prandin;on ACE and statin

## 2014-06-20 NOTE — Assessment & Plan Note (Signed)
Hb to 8.2, did not require tx

## 2014-06-20 NOTE — Assessment & Plan Note (Signed)
revision of the total hip arthroplasty and placement of antibiotic beads. Infectious disease was consulted. Patient was discharged on vancomycin and Levaquin. For 8 weeks

## 2014-06-20 NOTE — Assessment & Plan Note (Signed)
Continue lipitor  ?

## 2014-06-22 ENCOUNTER — Encounter: Payer: Self-pay | Admitting: Internal Medicine

## 2014-06-22 DIAGNOSIS — R079 Chest pain, unspecified: Secondary | ICD-10-CM | POA: Insufficient documentation

## 2014-06-22 DIAGNOSIS — M1611 Unilateral primary osteoarthritis, right hip: Secondary | ICD-10-CM | POA: Diagnosis not present

## 2014-07-12 ENCOUNTER — Encounter: Payer: Self-pay | Admitting: Infectious Diseases

## 2014-07-12 ENCOUNTER — Ambulatory Visit (INDEPENDENT_AMBULATORY_CARE_PROVIDER_SITE_OTHER): Payer: Medicare Other | Admitting: Infectious Diseases

## 2014-07-12 VITALS — BP 131/71 | HR 112 | Temp 98.9°F

## 2014-07-12 DIAGNOSIS — E119 Type 2 diabetes mellitus without complications: Secondary | ICD-10-CM

## 2014-07-12 DIAGNOSIS — M869 Osteomyelitis, unspecified: Secondary | ICD-10-CM

## 2014-07-12 LAB — C-REACTIVE PROTEIN: CRP: 1 mg/dL — ABNORMAL HIGH (ref <0.60)

## 2014-07-12 NOTE — Assessment & Plan Note (Signed)
States she is now down to insulin at night only. FSG 80-90s

## 2014-07-12 NOTE — Progress Notes (Signed)
RN received verbal order to discontinue the patient's PICC line.  Patient identified with name and date of birth. PICC dressing removed, site unremarkable.  PICC line removed using sterile procedure @ 1515. PICC length equal to that noted in patient's hospital chart of 36 cm. Sterile petroleum gauze + sterile 4X4 applied to PICC site, pressure applied for 10 minutes and covered with Medipore tape as a pressure dressing. Patient tolerated procedure without complaints.  Patient instructed to limit use of arm for 1 hour. Patient instructed that the pressure dressing should remain in place for 24 hours. Patient verbalized understanding of these instructions.

## 2014-07-12 NOTE — Assessment & Plan Note (Signed)
She is doing well, however have some concern about her continued wound. Will stop her IV anbx, at her persistence, will change her to doxy alone. Will recheck her ESR and CRP. Plan for at least 6 months of po? Will see her back in 3 months.

## 2014-07-12 NOTE — Progress Notes (Signed)
   Subjective:    Patient ID: Kelly Richardson, female    DOB: 08/25/1950, 64 y.o.   MRN: [id number]  HPI 64 yo F with hx of DM2 for last year, who had R THR ~2011. She had open wound and clear d/c for the last 6 months. She was seen by her PMD and was given topical anbx for this. In November, her leg began to swell and she was started on po doxy. She states that the d/c became purulent at this point. She was sent to general surgery who immediately sent her to ortho. She had a CT showing a possible abscess and she was brought to [facility] emergently on 12-4. She underwent Irrigation and Debridement Right Hip, Revision of Cup and Head of Total Hip with  Placement of Antibiotic Beads with 1g vancomycin and 240 mg gentamicin. She was started on vanco/levaqun and then was d/c home on 12-8 on same. Her Cx were (-).  Still has small amt of wound drainage.  Wants to go home, wants her "own bed, her own house, her own man".  No problems with her pic line. No fever or chills. She can walk on her hip with a walker.  Lost ~15# in SNF.  FSG have been 80-90.   Review of Systems  Constitutional: Negative for appetite change and unexpected weight change.  Gastrointestinal: Negative for diarrhea and constipation.  Genitourinary: Negative for difficulty urinating.      Objective:   Physical Exam  Constitutional: She appears well-developed and well-nourished.  HENT:  Mouth/Throat: No oropharyngeal exudate.  Eyes: EOM are normal. Pupils are equal, round, and reactive to light.  Neck: Neck supple.  Cardiovascular: Normal rate, regular rhythm and normal heart sounds.   Pulmonary/Chest: Effort normal and breath sounds normal.  Abdominal: Soft. Bowel sounds are normal. There is no tenderness. There is no rebound.  Musculoskeletal:       Legs: Lymphadenopathy:    She has no cervical adenopathy.    ESR 105 CRP 9.3 (06-11-14)     Assessment & Plan:

## 2014-07-13 ENCOUNTER — Encounter: Payer: Self-pay | Admitting: Internal Medicine

## 2014-07-13 ENCOUNTER — Non-Acute Institutional Stay (SKILLED_NURSING_FACILITY): Payer: Medicare Other | Admitting: Internal Medicine

## 2014-07-13 DIAGNOSIS — F411 Generalized anxiety disorder: Secondary | ICD-10-CM | POA: Diagnosis not present

## 2014-07-13 DIAGNOSIS — D500 Iron deficiency anemia secondary to blood loss (chronic): Secondary | ICD-10-CM | POA: Diagnosis not present

## 2014-07-13 DIAGNOSIS — Z9660 Presence of unspecified orthopedic joint implant: Secondary | ICD-10-CM

## 2014-07-13 DIAGNOSIS — E119 Type 2 diabetes mellitus without complications: Secondary | ICD-10-CM | POA: Diagnosis not present

## 2014-07-13 DIAGNOSIS — Z96649 Presence of unspecified artificial hip joint: Secondary | ICD-10-CM

## 2014-07-13 DIAGNOSIS — I10 Essential (primary) hypertension: Secondary | ICD-10-CM | POA: Diagnosis not present

## 2014-07-13 DIAGNOSIS — M1611 Unilateral primary osteoarthritis, right hip: Secondary | ICD-10-CM | POA: Diagnosis not present

## 2014-07-13 DIAGNOSIS — M869 Osteomyelitis, unspecified: Secondary | ICD-10-CM

## 2014-07-13 DIAGNOSIS — E785 Hyperlipidemia, unspecified: Secondary | ICD-10-CM | POA: Diagnosis not present

## 2014-07-13 LAB — SEDIMENTATION RATE: Sed Rate: 73 mm/hr — ABNORMAL HIGH (ref 0–22)

## 2014-07-14 ENCOUNTER — Encounter: Payer: Self-pay | Admitting: Internal Medicine

## 2014-07-14 NOTE — Progress Notes (Signed)
MRN: [id number] Name: Kelly Richardson  Sex: female Age: 64 y.o. DOB: 01-Oct-1950  [facility] Facility[facility]Room: Level Of Care: SNF Provider: Merrilee SeashoreALEXANDER, Doil Kamara D Emergency Contacts: Extended Emergency Contact Information Primary Emergency Contact: Webb,Rick Address: [address]          [place], [place] [id number] [place] Home Phone: [phone] Mobile Phone: [phone] Relation: Significant other    Allergies: Penicillins; Seldane; Sulfonamide derivatives; Other; Iohexol; Latex; Metformin; and Xanax  Chief Complaint  Patient presents with  . Discharge Note    HPI: Patient is 64 y.o. female who was admitted to SNF for prolonged abx and therapy for a septic hip after replacement who is now ready to be discharged to home.  Past Medical History  Diagnosis Date  . Diabetes mellitus without complication   . Complication of anesthesia   . PONV (postoperative nausea and vomiting)   . Hypertension   . Heart murmur     as a child  . 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 Shortness of breath dyspnea   . Arthritis     Past Surgical History  Procedure Laterality Date  . Total hip arthroplasty Right 2011  . Abdominal hysterectomy    . Cholecystectomy  1995  . Tonsillectomy    . Incision and drainage hip Right 06/08/2014    Procedure: Irrigation and Debridement Right Hip, Revision of Cup and Head of Total Hip with  Placement  of Antibiotic Spacer;  Surgeon: Nadara MustardMarcus Duda V, MD;  Location: [facility] OR;  Service: Orthopedics;  Laterality: Right;      Medication List       This list is accurate as of: 07/13/14 11:59 PM.  Always use your most recent med list.               aspirin EC 81 MG tablet  Take 81 mg by mouth daily.     atorvastatin 20 MG tablet  Commonly known as:  LIPITOR  Take 20 mg by mouth at bedtime.     doxycycline 100 MG capsule  Commonly known as:  VIBRAMYCIN  Take 100 mg by mouth 2 (two) times daily.     ferrous  sulfate 325 (65 FE) MG tablet  Take 325 mg by mouth daily with breakfast.     hydrocortisone cream 1 %  Apply 1 application topically 2 (two) times daily as needed for itching.     KERYDIN 5 % Soln  Generic drug:  Tavaborole  Apply 1 drop topically daily.     LEVEMIR FLEXTOUCH 100 UNIT/ML Pen  Generic drug:  Insulin Detemir  Inject 45 Units into the skin at bedtime.     lisinopril-hydrochlorothiazide 20-12.5 MG per tablet  Commonly known as:  PRINZIDE,ZESTORETIC  Take 1 tablet by mouth daily.     mupirocin cream 2 %  Commonly known as:  BACTROBAN  Apply 1 application topically as needed (Hip infection).     NOVOLOG FLEXPEN 100 UNIT/ML FlexPen  Generic drug:  insulin aspart  Inject 8 Units into the skin 3 (three) times daily with meals.     oxyCODONE-acetaminophen 5-325 MG per tablet  Commonly known as:  PERCOCET/ROXICET  Take one tablet by mouth every four hours as needed for severe pain *Do not exceed 3gm APAP/24hour*     polyethylene glycol packet  Commonly known as:  MIRALAX / GLYCOLAX  Take 17 g by mouth daily.     repaglinide 1 MG tablet  Commonly  known as:  PRANDIN  Take 1 tablet (1 mg total) by mouth 3 (three) times daily before meals.        Meds ordered this encounter  Medications  . polyethylene glycol (MIRALAX / GLYCOLAX) packet    Sig: Take 17 g by mouth daily.  . ferrous sulfate 325 (65 FE) MG tablet    Sig: Take 325 mg by mouth daily with breakfast.  . hydrocortisone cream 1 %    Sig: Apply 1 application topically 2 (two) times daily as needed for itching.    Immunization History  Administered Date(s) Administered  . Td 12/20/2006    History  Substance Use Topics  . Smoking status: Never Smoker   . Smokeless tobacco: Not on file  . Alcohol Use: No    Filed Vitals:   07/13/14 1435  BP: 100/60  Pulse: 84  Temp: 98 F (36.7 C)  Resp: 18    Physical Exam  GENERAL APPEARANCE: Alert, conversant. No acute distress.  HEENT:  Unremarkable. RESPIRATORY: Breathing is even, unlabored. Lung sounds are clear   CARDIOVASCULAR: Heart RRR no murmurs, rubs or gallops. No peripheral edema.  GASTROINTESTINAL: Abdomen is soft, non-tender, not distended w/ normal bowel sounds.  NEUROLOGIC: Cranial nerves 2-12 grossly intact. Moves all extremities  Patient Active Problem List   Diagnosis Date Noted  . Osteomyelitis of right hip 07/12/2014  . Chest pain 06/22/2014  . Anemia due to blood loss 06/20/2014  . Status post revision of total hip replacement 06/08/2014  . ANXIETY 03/05/2008  . FATIGUE 03/05/2008  . Type 2 diabetes mellitus without complication 01/02/2008  . Hyperlipidemia 01/02/2008  . Essential hypertension 01/02/2008    CBC    Component Value Date/Time   WBC 9.5 06/13/2014 0948   RBC 3.00* 06/13/2014 0948   HGB 8.2* 06/13/2014 1004   HCT 24.0* 06/13/2014 1004   PLT 282 06/13/2014 0948   MCV 84.3 06/13/2014 0948   LYMPHSABS 2.1 06/13/2014 0948   MONOABS 1.0 06/13/2014 0948   EOSABS 0.3 06/13/2014 0948   BASOSABS 0.0 06/13/2014 0948    CMP     Component Value Date/Time   NA 139 06/13/2014 1004   K 3.3* 06/13/2014 1004   CL 102 06/13/2014 1004   CO2 26 06/12/2014 0605   GLUCOSE 153* 06/13/2014 1004   BUN 8 06/13/2014 1004   CREATININE 0.90 06/13/2014 1004   CALCIUM 9.2 06/12/2014 0605   PROT 8.5* 06/08/2014 1445   ALBUMIN 3.9 06/08/2014 1445   AST 64* 06/08/2014 1445   ALT 73* 06/08/2014 1445   ALKPHOS 106 06/08/2014 1445   BILITOT 0.6 06/08/2014 1445   GFRNONAA 76* 06/12/2014 0605   GFRAA 88* 06/12/2014 0605    Assessment and Plan  Pt is being d/c to home on prolonged po abx therapy with HH/OT/PT/nursing and appropriate DME's.  Pt was d/c on 07/13/2014. Epic threw me out and I couldn't get back in. Margit Hanks, MD

## 2014-07-15 DIAGNOSIS — M6281 Muscle weakness (generalized): Secondary | ICD-10-CM | POA: Diagnosis not present

## 2014-07-15 DIAGNOSIS — Z96641 Presence of right artificial hip joint: Secondary | ICD-10-CM | POA: Diagnosis not present

## 2014-07-15 DIAGNOSIS — T8131XD Disruption of external operation (surgical) wound, not elsewhere classified, subsequent encounter: Secondary | ICD-10-CM | POA: Diagnosis not present

## 2014-07-15 DIAGNOSIS — Z794 Long term (current) use of insulin: Secondary | ICD-10-CM | POA: Diagnosis not present

## 2014-07-15 DIAGNOSIS — E119 Type 2 diabetes mellitus without complications: Secondary | ICD-10-CM | POA: Diagnosis not present

## 2014-07-15 DIAGNOSIS — I10 Essential (primary) hypertension: Secondary | ICD-10-CM | POA: Diagnosis not present

## 2014-07-17 DIAGNOSIS — M6281 Muscle weakness (generalized): Secondary | ICD-10-CM | POA: Diagnosis not present

## 2014-07-17 DIAGNOSIS — Z794 Long term (current) use of insulin: Secondary | ICD-10-CM | POA: Diagnosis not present

## 2014-07-17 DIAGNOSIS — I10 Essential (primary) hypertension: Secondary | ICD-10-CM | POA: Diagnosis not present

## 2014-07-17 DIAGNOSIS — Z96641 Presence of right artificial hip joint: Secondary | ICD-10-CM | POA: Diagnosis not present

## 2014-07-17 DIAGNOSIS — T8131XD Disruption of external operation (surgical) wound, not elsewhere classified, subsequent encounter: Secondary | ICD-10-CM | POA: Diagnosis not present

## 2014-07-17 DIAGNOSIS — E119 Type 2 diabetes mellitus without complications: Secondary | ICD-10-CM | POA: Diagnosis not present

## 2014-07-19 DIAGNOSIS — E119 Type 2 diabetes mellitus without complications: Secondary | ICD-10-CM | POA: Diagnosis not present

## 2014-07-19 DIAGNOSIS — Z96641 Presence of right artificial hip joint: Secondary | ICD-10-CM | POA: Diagnosis not present

## 2014-07-19 DIAGNOSIS — Z794 Long term (current) use of insulin: Secondary | ICD-10-CM | POA: Diagnosis not present

## 2014-07-19 DIAGNOSIS — T8131XD Disruption of external operation (surgical) wound, not elsewhere classified, subsequent encounter: Secondary | ICD-10-CM | POA: Diagnosis not present

## 2014-07-19 DIAGNOSIS — I10 Essential (primary) hypertension: Secondary | ICD-10-CM | POA: Diagnosis not present

## 2014-07-19 DIAGNOSIS — M6281 Muscle weakness (generalized): Secondary | ICD-10-CM | POA: Diagnosis not present

## 2014-07-20 DIAGNOSIS — M6281 Muscle weakness (generalized): Secondary | ICD-10-CM | POA: Diagnosis not present

## 2014-07-20 DIAGNOSIS — Z794 Long term (current) use of insulin: Secondary | ICD-10-CM | POA: Diagnosis not present

## 2014-07-20 DIAGNOSIS — T8131XD Disruption of external operation (surgical) wound, not elsewhere classified, subsequent encounter: Secondary | ICD-10-CM | POA: Diagnosis not present

## 2014-07-20 DIAGNOSIS — I10 Essential (primary) hypertension: Secondary | ICD-10-CM | POA: Diagnosis not present

## 2014-07-20 DIAGNOSIS — E119 Type 2 diabetes mellitus without complications: Secondary | ICD-10-CM | POA: Diagnosis not present

## 2014-07-20 DIAGNOSIS — Z96641 Presence of right artificial hip joint: Secondary | ICD-10-CM | POA: Diagnosis not present

## 2014-07-25 DIAGNOSIS — M6281 Muscle weakness (generalized): Secondary | ICD-10-CM | POA: Diagnosis not present

## 2014-07-25 DIAGNOSIS — Z96641 Presence of right artificial hip joint: Secondary | ICD-10-CM | POA: Diagnosis not present

## 2014-07-25 DIAGNOSIS — T8131XD Disruption of external operation (surgical) wound, not elsewhere classified, subsequent encounter: Secondary | ICD-10-CM | POA: Diagnosis not present

## 2014-07-25 DIAGNOSIS — Z794 Long term (current) use of insulin: Secondary | ICD-10-CM | POA: Diagnosis not present

## 2014-07-25 DIAGNOSIS — E119 Type 2 diabetes mellitus without complications: Secondary | ICD-10-CM | POA: Diagnosis not present

## 2014-07-25 DIAGNOSIS — I10 Essential (primary) hypertension: Secondary | ICD-10-CM | POA: Diagnosis not present

## 2014-07-26 ENCOUNTER — Ambulatory Visit: Payer: Medicare Other | Admitting: Podiatrist

## 2014-07-28 ENCOUNTER — Emergency Department (HOSPITAL_COMMUNITY)
Admission: EM | Admit: 2014-07-28 | Discharge: 2014-07-28 | Disposition: A | Discharge status: Home / Self Care | Payer: Medicare Other | Attending: Emergency Medicine | Admitting: Emergency Medicine

## 2014-07-28 DIAGNOSIS — R011 Cardiac murmur, unspecified: Secondary | ICD-10-CM | POA: Insufficient documentation

## 2014-07-28 DIAGNOSIS — Z96641 Presence of right artificial hip joint: Secondary | ICD-10-CM | POA: Diagnosis not present

## 2014-07-28 DIAGNOSIS — Z7982 Long term (current) use of aspirin: Secondary | ICD-10-CM | POA: Diagnosis not present

## 2014-07-28 DIAGNOSIS — Z91040 Latex allergy status: Secondary | ICD-10-CM | POA: Diagnosis not present

## 2014-07-28 DIAGNOSIS — E119 Type 2 diabetes mellitus without complications: Secondary | ICD-10-CM | POA: Diagnosis not present

## 2014-07-28 DIAGNOSIS — R000 Tachycardia, unspecified: Secondary | ICD-10-CM | POA: Insufficient documentation

## 2014-07-28 DIAGNOSIS — M6281 Muscle weakness (generalized): Secondary | ICD-10-CM | POA: Diagnosis not present

## 2014-07-28 DIAGNOSIS — F29 Unspecified psychosis not due to a substance or known physiological condition: Secondary | ICD-10-CM | POA: Diagnosis not present

## 2014-07-28 DIAGNOSIS — T8131XD Disruption of external operation (surgical) wound, not elsewhere classified, subsequent encounter: Secondary | ICD-10-CM | POA: Diagnosis not present

## 2014-07-28 DIAGNOSIS — R0600 Dyspnea, unspecified: Secondary | ICD-10-CM | POA: Insufficient documentation

## 2014-07-28 DIAGNOSIS — F99 Mental disorder, not otherwise specified: Secondary | ICD-10-CM | POA: Diagnosis not present

## 2014-07-28 DIAGNOSIS — I10 Essential (primary) hypertension: Secondary | ICD-10-CM | POA: Insufficient documentation

## 2014-07-28 DIAGNOSIS — Z880 Allergy status to penicillin: Secondary | ICD-10-CM | POA: Diagnosis not present

## 2014-07-28 DIAGNOSIS — Z79899 Other long term (current) drug therapy: Secondary | ICD-10-CM | POA: Diagnosis not present

## 2014-07-28 DIAGNOSIS — M1990 Unspecified osteoarthritis, unspecified site: Secondary | ICD-10-CM | POA: Diagnosis not present

## 2014-07-28 DIAGNOSIS — Z794 Long term (current) use of insulin: Secondary | ICD-10-CM | POA: Insufficient documentation

## 2014-07-28 DIAGNOSIS — Z8669 Personal history of other diseases of the nervous system and sense organs: Secondary | ICD-10-CM | POA: Diagnosis not present

## 2014-07-28 LAB — COMPREHENSIVE METABOLIC PANEL
ALT: 23 U/L (ref 0–35)
AST: 23 U/L (ref 0–37)
Albumin: 3.2 g/dL — ABNORMAL LOW (ref 3.5–5.2)
Alkaline Phosphatase: 80 U/L (ref 39–117)
Anion gap: 8 (ref 5–15)
BUN: 17 mg/dL (ref 6–23)
CO2: 27 mmol/L (ref 19–32)
Calcium: 9 mg/dL (ref 8.4–10.5)
Chloride: 106 mmol/L (ref 96–112)
Creatinine, Ser: 1.15 mg/dL — ABNORMAL HIGH (ref 0.50–1.10)
GFR calc Af Amer: 58 mL/min — ABNORMAL LOW (ref >90)
GFR calc non Af Amer: 50 mL/min — ABNORMAL LOW (ref >90)
Glucose, Bld: 156 mg/dL — ABNORMAL HIGH (ref 70–99)
Potassium: 4.3 mmol/L (ref 3.5–5.1)
Sodium: 141 mmol/L (ref 135–145)
Total Bilirubin: 0.3 mg/dL (ref 0.3–1.2)
Total Protein: 6.9 g/dL (ref 6.0–8.3)

## 2014-07-28 LAB — CBC WITH DIFFERENTIAL/PLATELET
Basophils Absolute: 0 10*3/uL (ref 0.0–0.1)
Basophils Relative: 0 % (ref 0–1)
Eosinophils Absolute: 0.6 10*3/uL (ref 0.0–0.7)
Eosinophils Relative: 6 % — ABNORMAL HIGH (ref 0–5)
HCT: 28.1 % — ABNORMAL LOW (ref 36.0–46.0)
Hemoglobin: 8.6 g/dL — ABNORMAL LOW (ref 12.0–15.0)
Lymphocytes Relative: 20 % (ref 12–46)
Lymphs Abs: 1.9 10*3/uL (ref 0.7–4.0)
MCH: 25.8 pg — ABNORMAL LOW (ref 26.0–34.0)
MCHC: 30.6 g/dL (ref 30.0–36.0)
MCV: 84.4 fL (ref 78.0–100.0)
Monocytes Absolute: 0.8 10*3/uL (ref 0.1–1.0)
Monocytes Relative: 8 % (ref 3–12)
Neutro Abs: 6.1 10*3/uL (ref 1.7–7.7)
Neutrophils Relative %: 66 % (ref 43–77)
Platelets: 330 10*3/uL (ref 150–400)
RBC: 3.33 MIL/uL — ABNORMAL LOW (ref 3.87–5.11)
RDW: 15.9 % — ABNORMAL HIGH (ref 11.5–15.5)
WBC: 9.4 10*3/uL (ref 4.0–10.5)

## 2014-07-28 LAB — ABO/RH: ABO/RH(D): A POS

## 2014-07-28 LAB — TYPE AND SCREEN
ABO/RH(D): A POS
Antibody Screen: NEGATIVE

## 2014-07-28 LAB — POC OCCULT BLOOD, ED: Fecal Occult Bld: NEGATIVE

## 2014-07-28 MED ORDER — SODIUM CHLORIDE 0.9 % IV SOLN
80.0000 mg | Freq: Once | INTRAVENOUS | Status: AC
  Administered 2014-07-28: 80 mg via INTRAVENOUS
  Filled 2014-07-28: qty 80

## 2014-07-28 MED ORDER — SODIUM CHLORIDE 0.9 % IV BOLUS (SEPSIS)
1000.0000 mL | Freq: Once | INTRAVENOUS | Status: AC
  Administered 2014-07-28: 1000 mL via INTRAVENOUS

## 2014-07-28 NOTE — ED Notes (Signed)
Bed: [id number] Expected date: 07/28/14 Expected time: 3:19 PM Means of arrival: Ambulance Comments: Tachy at times

## 2014-07-28 NOTE — ED Provider Notes (Signed)
CSN: [id number]     Arrival date & time 07/28/14  1526 History   First MD Initiated Contact with Patient 07/28/14 1528     Chief Complaint  Patient presents with  . Anxiety  . Tachycardia     (Consider location/radiation/quality/duration/timing/severity/associated sxs/prior Treatment) HPI    64 year old female presenting for evaluation of tachycardia. She has 2 acute complaints. No fevers or chills. She reports that her right upper extremity wound has been healing well. No fevers or chills. No shortness of breath. No chest pain. No dizziness or lightheadedness. Denies any ingestion. Reports compliance with her medications.    Past Medical History  Diagnosis Date  . Diabetes mellitus without complication   . Complication of anesthesia   . PONV (postoperative nausea and vomiting)   . Hypertension   . Heart murmur     as a child  . 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 Shortness of breath dyspnea   . Arthritis    Past Surgical History  Procedure Laterality Date  . Total hip arthroplasty Right 2011  . Abdominal hysterectomy    . Cholecystectomy  1995  . Tonsillectomy    . Incision and drainage hip Right 06/08/2014    Procedure: Irrigation and Debridement Right Hip, Revision of Cup and Head of Total Hip with  Placement  of Antibiotic Spacer;  Surgeon: Nadara MustardMarcus Duda V, MD;  Location: [facility] OR;  Service: Orthopedics;  Laterality: Right;   No family history on file. History  Substance Use Topics  . Smoking status: Never Smoker   . Smokeless tobacco: Not on file  . Alcohol Use: No   OB History    No data available     Review of Systems    Allergies  Penicillins; Seldane; Sulfonamide derivatives; Other; Iohexol; Latex; Metformin; Vicks formula 44 cough relief; and Xanax  Home Medications   Prior to Admission medications   Medication Sig Start Date End Date Taking? Authorizing Provider  aspirin EC 81 MG tablet Take 81 mg by mouth daily.   Yes Historical  Provider, MD  atorvastatin (LIPITOR) 20 MG tablet Take 20 mg by mouth at bedtime.  03/26/13  Yes Historical Provider, MD  ferrous sulfate 325 (65 FE) MG tablet Take 325 mg by mouth daily with breakfast.   Yes Historical Provider, MD  hydrocortisone cream 1 % Apply 1 application topically 2 (two) times daily as needed for itching.   Yes Historical Provider, MD  LEVEMIR FLEXTOUCH 100 UNIT/ML SOPN Inject 45 Units into the skin at bedtime.  03/02/13  Yes Historical Provider, MD  lisinopril-hydrochlorothiazide (PRINZIDE,ZESTORETIC) 20-12.5 MG per tablet Take 1 tablet by mouth daily.   Yes Historical Provider, MD  mupirocin cream (BACTROBAN) 2 % Apply 1 application topically as needed (Hip infection).    Yes Historical Provider, MD  polyethylene glycol (MIRALAX / GLYCOLAX) packet Take 17 g by mouth daily.   Yes Historical Provider, MD  Tavaborole (KERYDIN) 5 % SOLN Apply 1 drop topically daily.   Yes Historical Provider, MD  traMADol (ULTRAM) 50 MG tablet Take 100 mg by mouth every 6 (six) hours as needed.   Yes Historical Provider, MD  oxyCODONE-acetaminophen (PERCOCET/ROXICET) 5-325 MG per tablet Take one tablet by mouth every four hours as needed for severe pain *Do not exceed 3gm APAP/24hour* 06/15/14   Shauna HughMonica Shamsid-Deen Carter, DO  repaglinide (PRANDIN) 1 MG tablet Take 1 tablet (1 mg total) by mouth 3 (three) times daily before meals. 09/08/12   Iva  Hildred Laser, MD   BP 107/68 mmHg  Pulse 96  Temp(Src) 99.1 F (37.3 C) (Oral)  Resp 18  SpO2 100% Physical Exam  Constitutional: She appears well-developed and well-nourished. No distress.  HENT:  Head: Normocephalic and atraumatic.  Eyes: Conjunctivae are normal. Right eye exhibits no discharge. Left eye exhibits no discharge.  Neck: Neck supple.  Cardiovascular: Regular rhythm and normal heart sounds.  Exam reveals no gallop and no friction rub.   No murmur heard. Mild tachycardia. No significant ectopy appreciated  Pulmonary/Chest: Effort normal  and breath sounds normal. No respiratory distress.  Abdominal: Soft. She exhibits no distension. There is no tenderness.  Musculoskeletal: She exhibits no edema or tenderness.  Wound to right lateral thigh generally appears to be healing well. There is a very tiny area along the middle to distal aspect the incision with minimal serous drainage. No localized tenderness or surrounding cellulitis. This area is superficial and did not probe deeper. Neurovascular intact distally.  Neurological: She is alert.  Skin: Skin is warm and dry.  Psychiatric: She has a normal mood and affect. Her behavior is normal. Thought content normal.  Nursing note and vitals reviewed.   ED Course  Procedures (including critical care time) Labs Review Labs Reviewed  COMPREHENSIVE METABOLIC PANEL - Abnormal; Notable for the following:    Glucose, Bld 156 (*)    Creatinine, Ser 1.15 (*)    Albumin 3.2 (*)    GFR calc non Af Amer 50 (*)    GFR calc Af Amer 58 (*)    All other components within normal limits  CBC WITH DIFFERENTIAL/PLATELET  POC OCCULT BLOOD, ED  TYPE AND SCREEN  ABO/RH    Imaging Review No results found.   EKG Interpretation   Date/Time:  Saturday July 28 2014 15:29:34 EST Ventricular Rate:  105 PR Interval:  142 QRS Duration: 80 QT Interval:  339 QTC Calculation: 448 R Axis:   72 Text Interpretation:  Sinus tachycardia Low voltage, precordial leads  Confirmed by Fayrene Fearing  MD, MARK ([id number]) on 07/28/2014 3:38:07 PM      MDM   Final diagnoses:  Dyspnea  Sinus tachycardia   64 year old female with tachycardia. She really has no complaints though. Per review her records, she has had tachycardia on previous visits. Workup is fairly unremarkable. She is normotensive. Oxygen saturation on room air. Feel she is stable for discharge at this time. Low suspicion for emergent process.   Vitals - 1 value per visit 07/28/2014 07/13/2014 07/12/2014 06/20/2014  Pulse 96 84 112 70   Vitals - 1  value per visit 06/16/2014 06/15/2014 06/13/2014 06/12/2014  Pulse 92 74 89 95   Vitals - 1 value per visit 06/11/2014 06/08/2014 06/07/2014 01/09/2014  Pulse    104   Vitals - 1 value per visit 04/25/2013 09/08/2012 04/21/2012 04/20/2012  Pulse 101 86 99    Vitals - 1 value per visit 08/09/2008 03/05/2008 01/02/2008  Pulse 98 102 91      Raeford Razor, MD 08/01/14 1342

## 2014-07-28 NOTE — ED Notes (Signed)
Per EMS home health nurse called out for patient due to anxiety attack and increase heart rate between 100-120. Upon arrival EMS reports pt c/o nausea for the past few days. Pt denies nausea or anxiety at this time and stated, "as soon as I laid down in the ambulance it all went away...everything did." Pt reported, "I felt like my heart was racing last night and I couldn't sleep because I knew she was coming" (referring to the home health nurse). EMS stated that there may have been some type of disagreement between the home health nurse and the pt. Per EMS home health nurse stated, "I just told her when she was moving around her heart rate was a little elevated between 100-120."

## 2014-08-01 DIAGNOSIS — I10 Essential (primary) hypertension: Secondary | ICD-10-CM | POA: Diagnosis not present

## 2014-08-01 DIAGNOSIS — T8131XD Disruption of external operation (surgical) wound, not elsewhere classified, subsequent encounter: Secondary | ICD-10-CM | POA: Diagnosis not present

## 2014-08-01 DIAGNOSIS — E119 Type 2 diabetes mellitus without complications: Secondary | ICD-10-CM | POA: Diagnosis not present

## 2014-08-01 DIAGNOSIS — M6281 Muscle weakness (generalized): Secondary | ICD-10-CM | POA: Diagnosis not present

## 2014-08-01 DIAGNOSIS — Z96641 Presence of right artificial hip joint: Secondary | ICD-10-CM | POA: Diagnosis not present

## 2014-08-01 DIAGNOSIS — Z794 Long term (current) use of insulin: Secondary | ICD-10-CM | POA: Diagnosis not present

## 2014-08-03 DIAGNOSIS — I10 Essential (primary) hypertension: Secondary | ICD-10-CM | POA: Diagnosis not present

## 2014-08-03 DIAGNOSIS — Z794 Long term (current) use of insulin: Secondary | ICD-10-CM | POA: Diagnosis not present

## 2014-08-03 DIAGNOSIS — T8131XD Disruption of external operation (surgical) wound, not elsewhere classified, subsequent encounter: Secondary | ICD-10-CM | POA: Diagnosis not present

## 2014-08-03 DIAGNOSIS — M6281 Muscle weakness (generalized): Secondary | ICD-10-CM | POA: Diagnosis not present

## 2014-08-03 DIAGNOSIS — Z96641 Presence of right artificial hip joint: Secondary | ICD-10-CM | POA: Diagnosis not present

## 2014-08-03 DIAGNOSIS — E119 Type 2 diabetes mellitus without complications: Secondary | ICD-10-CM | POA: Diagnosis not present

## 2014-08-08 ENCOUNTER — Other Ambulatory Visit: Payer: Self-pay | Admitting: Internal Medicine

## 2014-08-08 DIAGNOSIS — T8131XD Disruption of external operation (surgical) wound, not elsewhere classified, subsequent encounter: Secondary | ICD-10-CM | POA: Diagnosis not present

## 2014-08-08 DIAGNOSIS — M6281 Muscle weakness (generalized): Secondary | ICD-10-CM | POA: Diagnosis not present

## 2014-08-08 DIAGNOSIS — I10 Essential (primary) hypertension: Secondary | ICD-10-CM | POA: Diagnosis not present

## 2014-08-08 DIAGNOSIS — E119 Type 2 diabetes mellitus without complications: Secondary | ICD-10-CM | POA: Diagnosis not present

## 2014-08-08 DIAGNOSIS — Z96641 Presence of right artificial hip joint: Secondary | ICD-10-CM | POA: Diagnosis not present

## 2014-08-08 DIAGNOSIS — Z794 Long term (current) use of insulin: Secondary | ICD-10-CM | POA: Diagnosis not present

## 2014-08-09 DIAGNOSIS — Z6839 Body mass index (BMI) 39.0-39.9, adult: Secondary | ICD-10-CM | POA: Diagnosis not present

## 2014-08-09 DIAGNOSIS — Z794 Long term (current) use of insulin: Secondary | ICD-10-CM | POA: Diagnosis not present

## 2014-08-09 DIAGNOSIS — I10 Essential (primary) hypertension: Secondary | ICD-10-CM | POA: Diagnosis not present

## 2014-08-09 DIAGNOSIS — D649 Anemia, unspecified: Secondary | ICD-10-CM | POA: Diagnosis not present

## 2014-08-09 DIAGNOSIS — M6281 Muscle weakness (generalized): Secondary | ICD-10-CM | POA: Diagnosis not present

## 2014-08-09 DIAGNOSIS — Z96641 Presence of right artificial hip joint: Secondary | ICD-10-CM | POA: Diagnosis not present

## 2014-08-09 DIAGNOSIS — T8131XD Disruption of external operation (surgical) wound, not elsewhere classified, subsequent encounter: Secondary | ICD-10-CM | POA: Diagnosis not present

## 2014-08-09 DIAGNOSIS — E119 Type 2 diabetes mellitus without complications: Secondary | ICD-10-CM | POA: Diagnosis not present

## 2014-08-10 DIAGNOSIS — Z96641 Presence of right artificial hip joint: Secondary | ICD-10-CM | POA: Diagnosis not present

## 2014-08-10 DIAGNOSIS — E119 Type 2 diabetes mellitus without complications: Secondary | ICD-10-CM | POA: Diagnosis not present

## 2014-08-10 DIAGNOSIS — I10 Essential (primary) hypertension: Secondary | ICD-10-CM | POA: Diagnosis not present

## 2014-08-10 DIAGNOSIS — T8131XD Disruption of external operation (surgical) wound, not elsewhere classified, subsequent encounter: Secondary | ICD-10-CM | POA: Diagnosis not present

## 2014-08-10 DIAGNOSIS — M6281 Muscle weakness (generalized): Secondary | ICD-10-CM | POA: Diagnosis not present

## 2014-08-10 DIAGNOSIS — Z794 Long term (current) use of insulin: Secondary | ICD-10-CM | POA: Diagnosis not present

## 2014-08-10 DIAGNOSIS — M1611 Unilateral primary osteoarthritis, right hip: Secondary | ICD-10-CM | POA: Diagnosis not present

## 2014-08-13 DIAGNOSIS — Z96641 Presence of right artificial hip joint: Secondary | ICD-10-CM | POA: Diagnosis not present

## 2014-08-13 DIAGNOSIS — E119 Type 2 diabetes mellitus without complications: Secondary | ICD-10-CM | POA: Diagnosis not present

## 2014-08-13 DIAGNOSIS — T8131XD Disruption of external operation (surgical) wound, not elsewhere classified, subsequent encounter: Secondary | ICD-10-CM | POA: Diagnosis not present

## 2014-08-13 DIAGNOSIS — I10 Essential (primary) hypertension: Secondary | ICD-10-CM | POA: Diagnosis not present

## 2014-08-13 DIAGNOSIS — M6281 Muscle weakness (generalized): Secondary | ICD-10-CM | POA: Diagnosis not present

## 2014-08-13 DIAGNOSIS — Z794 Long term (current) use of insulin: Secondary | ICD-10-CM | POA: Diagnosis not present

## 2014-08-28 DIAGNOSIS — E119 Type 2 diabetes mellitus without complications: Secondary | ICD-10-CM | POA: Diagnosis not present

## 2014-08-28 DIAGNOSIS — Z6841 Body Mass Index (BMI) 40.0 and over, adult: Secondary | ICD-10-CM | POA: Diagnosis not present

## 2014-08-28 DIAGNOSIS — Z1389 Encounter for screening for other disorder: Secondary | ICD-10-CM | POA: Diagnosis not present

## 2014-08-30 DIAGNOSIS — T8451XS Infection and inflammatory reaction due to internal right hip prosthesis, sequela: Secondary | ICD-10-CM | POA: Diagnosis not present

## 2014-08-30 DIAGNOSIS — M25551 Pain in right hip: Secondary | ICD-10-CM | POA: Diagnosis not present

## 2014-09-03 ENCOUNTER — Telehealth: Payer: Self-pay | Admitting: *Deleted

## 2014-09-03 NOTE — Telephone Encounter (Signed)
Per Dr Ninetta LightsHatcher called the patient and scheduled her for an appt 09/05/14 at 2 pm.

## 2014-09-05 ENCOUNTER — Encounter: Payer: Self-pay | Admitting: Infectious Diseases

## 2014-09-05 ENCOUNTER — Ambulatory Visit (INDEPENDENT_AMBULATORY_CARE_PROVIDER_SITE_OTHER): Payer: Medicare Other | Admitting: Infectious Diseases

## 2014-09-05 ENCOUNTER — Other Ambulatory Visit (HOSPITAL_COMMUNITY): Payer: Self-pay | Admitting: Orthopedic Surgery

## 2014-09-05 VITALS — BP 130/82 | HR 93 | Temp 97.9°F | Ht 61.0 in | Wt 218.0 lb

## 2014-09-05 DIAGNOSIS — M869 Osteomyelitis, unspecified: Secondary | ICD-10-CM | POA: Diagnosis not present

## 2014-09-05 NOTE — Assessment & Plan Note (Addendum)
I spoke with Dr Sharol Given- WBC 9.5, HgB 10.2, ESR 90, CRP 1.5, Cx was (-). Her plain films showed changes on medial side not seen previously.  Patient does not wish to undergo further surgery if this can be avoided. We certainly could restart her IV vanco, levaquin however her large fluid filled area is very concerning for deeper infection.  I agree that she needs further debridement.  She has multiple questions about possible surgery, timing, one or two. Rehab? She will f/u with Dr Sharol Given, she is tentatively planned for surgery on 09-08-14 at [facility]. I would recommend holding anbx until she has surgery to try and get better cultures.  Will forward to ID on call so that she can be seen in hospital.

## 2014-09-05 NOTE — Progress Notes (Signed)
   Subjective:    Patient ID: Kelly Richardson, female    DOB: 1950-12-11, 64 y.o.   MRN: [id number]  HPI 64 yo F with hx of DM2 for last year, who had R THR ~2011. She had open wound and clear d/c for the last 6 months. She was seen by her PMD and was given topical anbx for this. In November, her leg began to swell and she was started on po doxy. She states that the d/c became purulent at this point. She was sent to general surgery who immediately sent her to ortho. She had a CT showing a possible abscess and [facility]. She underwent Irrigation and Debridement Right Hip, Revision of Cup and Head of Total Hip with Placement of Antibiotic Beads with 1g vancomycin and 240 mg gentamicin. She was started on vanco/levaqun and then was d/c home on 12-8 on same. Her Cx were (-).   She was seen in ID on 1-7 and had her IV anbx stopped. She was then changed to po doxy. Had no trouble taking these. Stopped taking these when she ran out.   Since 08-25-14 she noticed swelling of her R hip and worsening of the hole in her hip. No f/c. No pain. Has noted a blister over her wound. No d/c from her wound.  She saw Dr Lajoyce Cornersuda and had aspirate done, no growth. She was started on cipro.   Review of Systems See above  FSG have been ~ 100. Doesn't take novolog during the day.     Objective:   Physical Exam  Constitutional: She appears well-developed and well-nourished.  Non-toxic appearance.  HENT:  Mouth/Throat: No oropharyngeal exudate.  Eyes: EOM are normal. Pupils are equal, round, and reactive to light.  Neck: Neck supple.  Cardiovascular: Normal rate, regular rhythm and normal heart sounds.   Pulmonary/Chest: Effort normal and breath sounds normal.  Abdominal: Soft. Bowel sounds are normal. There is no tenderness.  Musculoskeletal:       Legs: Lymphadenopathy:    She has no cervical adenopathy.          Assessment & Plan:

## 2014-09-06 ENCOUNTER — Encounter (HOSPITAL_COMMUNITY): Payer: Self-pay | Admitting: *Deleted

## 2014-09-06 MED ORDER — CLINDAMYCIN PHOSPHATE 900 MG/50ML IV SOLN
900.0000 mg | INTRAVENOUS | Status: AC
  Administered 2014-09-07: 900 mg via INTRAVENOUS
  Filled 2014-09-06: qty 50

## 2014-09-06 MED ORDER — CHLORHEXIDINE GLUCONATE 4 % EX LIQD
60.0000 mL | Freq: Once | CUTANEOUS | Status: DC
  Filled 2014-09-06: qty 60

## 2014-09-07 ENCOUNTER — Encounter (HOSPITAL_COMMUNITY): Payer: Self-pay | Admitting: Critical Care Medicine

## 2014-09-07 ENCOUNTER — Inpatient Hospital Stay (HOSPITAL_COMMUNITY): Payer: Medicare Other | Admitting: Anesthesiology

## 2014-09-07 ENCOUNTER — Inpatient Hospital Stay (HOSPITAL_COMMUNITY)
Admission: RE | Admit: 2014-09-07 | Discharge: 2014-09-11 | DRG: 464 | Disposition: A | Discharge status: Skilled Nursing Facility | Payer: Medicare Other | Source: Ambulatory Visit | Attending: Orthopedic Surgery | Admitting: Orthopedic Surgery

## 2014-09-07 ENCOUNTER — Encounter (HOSPITAL_COMMUNITY)
Admission: RE | Disposition: A | Discharge status: Skilled Nursing Facility | Payer: Self-pay | Source: Ambulatory Visit | Attending: Orthopedic Surgery

## 2014-09-07 DIAGNOSIS — Z452 Encounter for adjustment and management of vascular access device: Secondary | ICD-10-CM | POA: Diagnosis not present

## 2014-09-07 DIAGNOSIS — M25551 Pain in right hip: Secondary | ICD-10-CM | POA: Diagnosis not present

## 2014-09-07 DIAGNOSIS — E1165 Type 2 diabetes mellitus with hyperglycemia: Secondary | ICD-10-CM | POA: Diagnosis not present

## 2014-09-07 DIAGNOSIS — Z96649 Presence of unspecified artificial hip joint: Secondary | ICD-10-CM

## 2014-09-07 DIAGNOSIS — M62838 Other muscle spasm: Secondary | ICD-10-CM | POA: Diagnosis not present

## 2014-09-07 DIAGNOSIS — Y831 Surgical operation with implant of artificial internal device as the cause of abnormal reaction of the patient, or of later complication, without mention of misadventure at the time of the procedure: Secondary | ICD-10-CM | POA: Diagnosis present

## 2014-09-07 DIAGNOSIS — Z7982 Long term (current) use of aspirin: Secondary | ICD-10-CM | POA: Diagnosis not present

## 2014-09-07 DIAGNOSIS — M868X8 Other osteomyelitis, other site: Secondary | ICD-10-CM | POA: Diagnosis present

## 2014-09-07 DIAGNOSIS — T8459XA Infection and inflammatory reaction due to other internal joint prosthesis, initial encounter: Secondary | ICD-10-CM

## 2014-09-07 DIAGNOSIS — I10 Essential (primary) hypertension: Secondary | ICD-10-CM | POA: Diagnosis present

## 2014-09-07 DIAGNOSIS — D62 Acute posthemorrhagic anemia: Secondary | ICD-10-CM | POA: Diagnosis not present

## 2014-09-07 DIAGNOSIS — Z96641 Presence of right artificial hip joint: Secondary | ICD-10-CM | POA: Diagnosis not present

## 2014-09-07 DIAGNOSIS — E119 Type 2 diabetes mellitus without complications: Secondary | ICD-10-CM | POA: Diagnosis present

## 2014-09-07 DIAGNOSIS — L02415 Cutaneous abscess of right lower limb: Secondary | ICD-10-CM | POA: Diagnosis present

## 2014-09-07 DIAGNOSIS — Y839 Surgical procedure, unspecified as the cause of abnormal reaction of the patient, or of later complication, without mention of misadventure at the time of the procedure: Secondary | ICD-10-CM | POA: Diagnosis not present

## 2014-09-07 DIAGNOSIS — T8451XA Infection and inflammatory reaction due to internal right hip prosthesis, initial encounter: Principal | ICD-10-CM | POA: Diagnosis present

## 2014-09-07 DIAGNOSIS — T8451XD Infection and inflammatory reaction due to internal right hip prosthesis, subsequent encounter: Secondary | ICD-10-CM | POA: Diagnosis not present

## 2014-09-07 DIAGNOSIS — M6281 Muscle weakness (generalized): Secondary | ICD-10-CM | POA: Diagnosis not present

## 2014-09-07 DIAGNOSIS — Z9889 Other specified postprocedural states: Secondary | ICD-10-CM | POA: Diagnosis not present

## 2014-09-07 DIAGNOSIS — Z792 Long term (current) use of antibiotics: Secondary | ICD-10-CM | POA: Diagnosis not present

## 2014-09-07 DIAGNOSIS — Z4732 Aftercare following explantation of hip joint prosthesis: Secondary | ICD-10-CM | POA: Diagnosis not present

## 2014-09-07 DIAGNOSIS — Z794 Long term (current) use of insulin: Secondary | ICD-10-CM | POA: Diagnosis not present

## 2014-09-07 DIAGNOSIS — D649 Anemia, unspecified: Secondary | ICD-10-CM | POA: Diagnosis not present

## 2014-09-07 DIAGNOSIS — M869 Osteomyelitis, unspecified: Secondary | ICD-10-CM | POA: Diagnosis not present

## 2014-09-07 DIAGNOSIS — T8451XS Infection and inflammatory reaction due to internal right hip prosthesis, sequela: Secondary | ICD-10-CM | POA: Diagnosis not present

## 2014-09-07 HISTORY — PX: EXCISIONAL TOTAL HIP ARTHROPLASTY WITH ANTIBIOTIC SPACERS: SHX5826

## 2014-09-07 LAB — COMPREHENSIVE METABOLIC PANEL
ALBUMIN: 4.1 g/dL (ref 3.5–5.2)
ALT: 29 U/L (ref 0–35)
AST: 40 U/L — AB (ref 0–37)
Alkaline Phosphatase: 88 U/L (ref 39–117)
Anion gap: 13 (ref 5–15)
BUN: 17 mg/dL (ref 6–23)
CO2: 24 mmol/L (ref 19–32)
CREATININE: 1.08 mg/dL (ref 0.50–1.10)
Calcium: 9.8 mg/dL (ref 8.4–10.5)
Chloride: 102 mmol/L (ref 96–112)
GFR calc Af Amer: 63 mL/min — ABNORMAL LOW (ref >90)
GFR calc non Af Amer: 54 mL/min — ABNORMAL LOW (ref >90)
Glucose, Bld: 146 mg/dL — ABNORMAL HIGH (ref 70–99)
POTASSIUM: 4.5 mmol/L (ref 3.5–5.1)
Sodium: 139 mmol/L (ref 135–145)
Total Bilirubin: 0.8 mg/dL (ref 0.3–1.2)
Total Protein: 9 g/dL — ABNORMAL HIGH (ref 6.0–8.3)

## 2014-09-07 LAB — CBC
HCT: 36.4 % (ref 36.0–46.0)
HCT: 26.2 % — ABNORMAL LOW (ref 36.0–46.0)
HEMOGLOBIN: 11.3 g/dL — AB (ref 12.0–15.0)
HEMOGLOBIN: 8.3 g/dL — AB (ref 12.0–15.0)
MCH: 24.5 pg — ABNORMAL LOW (ref 26.0–34.0)
MCH: 24.6 pg — AB (ref 26.0–34.0)
MCHC: 31 g/dL (ref 30.0–36.0)
MCHC: 31.7 g/dL (ref 30.0–36.0)
MCV: 78.8 fL (ref 78.0–100.0)
MCV: 77.7 fL — AB (ref 78.0–100.0)
PLATELETS: 279 10*3/uL (ref 150–400)
Platelets: 331 10*3/uL (ref 150–400)
RBC: 4.62 MIL/uL (ref 3.87–5.11)
RBC: 3.37 MIL/uL — ABNORMAL LOW (ref 3.87–5.11)
RDW: 15.4 % (ref 11.5–15.5)
RDW: 15.3 % (ref 11.5–15.5)
WBC: 10 10*3/uL (ref 4.0–10.5)
WBC: 13.3 10*3/uL — ABNORMAL HIGH (ref 4.0–10.5)

## 2014-09-07 LAB — GLUCOSE, CAPILLARY
GLUCOSE-CAPILLARY: 158 mg/dL — AB (ref 70–99)
GLUCOSE-CAPILLARY: 251 mg/dL — AB (ref 70–99)
Glucose-Capillary: 141 mg/dL — ABNORMAL HIGH (ref 70–99)

## 2014-09-07 LAB — APTT: aPTT: 27 seconds (ref 24–37)

## 2014-09-07 LAB — PROTIME-INR
INR: 1.02 (ref 0.00–1.49)
PROTHROMBIN TIME: 13.5 s (ref 11.6–15.2)

## 2014-09-07 SURGERY — EXCISIONAL TOTAL HIP ARTHROPLASTY WITH ANTIBIOTIC SPACERS
Anesthesia: General | Site: Hip | Laterality: Right

## 2014-09-07 MED ORDER — PHENYLEPHRINE HCL 10 MG/ML IJ SOLN
10.0000 mg | INTRAVENOUS | Status: DC | PRN
  Administered 2014-09-07: 20 ug/min via INTRAVENOUS

## 2014-09-07 MED ORDER — SCOPOLAMINE 1 MG/3DAYS TD PT72
1.0000 | MEDICATED_PATCH | TRANSDERMAL | Status: DC
  Administered 2014-09-07: 1.5 mg via TRANSDERMAL
  Filled 2014-09-07: qty 1

## 2014-09-07 MED ORDER — MENTHOL 3 MG MT LOZG: 1.0000 | LOZENGE | OROMUCOSAL | Status: DC | PRN

## 2014-09-07 MED ORDER — FENTANYL CITRATE 0.05 MG/ML IJ SOLN
INTRAMUSCULAR | Status: AC
  Filled 2014-09-07: qty 2

## 2014-09-07 MED ORDER — CLINDAMYCIN PHOSPHATE 600 MG/50ML IV SOLN
600.0000 mg | Freq: Four times a day (QID) | INTRAVENOUS | Status: AC
  Administered 2014-09-08: 600 mg via INTRAVENOUS
  Filled 2014-09-07 (×2): qty 50

## 2014-09-07 MED ORDER — FENTANYL CITRATE 0.05 MG/ML IJ SOLN
25.0000 ug | INTRAMUSCULAR | Status: DC | PRN
  Administered 2014-09-07: 50 ug via INTRAVENOUS

## 2014-09-07 MED ORDER — PROMETHAZINE HCL 25 MG/ML IJ SOLN
6.2500 mg | INTRAMUSCULAR | Status: DC | PRN
Start: 2014-09-07 — End: 2014-09-07

## 2014-09-07 MED ORDER — DOCUSATE SODIUM 100 MG PO CAPS
100.0000 mg | ORAL_CAPSULE | Freq: Two times a day (BID) | ORAL | Status: DC
  Administered 2014-09-07 – 2014-09-11 (×7): 100 mg via ORAL
  Filled 2014-09-07 (×7): qty 1

## 2014-09-07 MED ORDER — VANCOMYCIN HCL 500 MG IV SOLR
INTRAVENOUS | Status: AC
  Filled 2014-09-07: qty 1000

## 2014-09-07 MED ORDER — FENTANYL CITRATE 0.05 MG/ML IJ SOLN
INTRAMUSCULAR | Status: AC
  Filled 2014-09-07: qty 5

## 2014-09-07 MED ORDER — LIDOCAINE HCL (CARDIAC) 20 MG/ML IV SOLN
INTRAVENOUS | Status: AC
  Filled 2014-09-07: qty 5

## 2014-09-07 MED ORDER — PHENYLEPHRINE 40 MCG/ML (10ML) SYRINGE FOR IV PUSH (FOR BLOOD PRESSURE SUPPORT)
PREFILLED_SYRINGE | INTRAVENOUS | Status: AC
  Filled 2014-09-07: qty 10

## 2014-09-07 MED ORDER — INSULIN ASPART 100 UNIT/ML ~~LOC~~ SOLN
0.0000 [IU] | Freq: Three times a day (TID) | SUBCUTANEOUS | Status: DC
  Administered 2014-09-08 (×2): 4 [IU] via SUBCUTANEOUS
  Administered 2014-09-09: 7 [IU] via SUBCUTANEOUS
  Administered 2014-09-09 – 2014-09-10 (×5): 4 [IU] via SUBCUTANEOUS
  Administered 2014-09-11: 3 [IU] via SUBCUTANEOUS

## 2014-09-07 MED ORDER — PHENOL 1.4 % MT LIQD: 1.0000 | OROMUCOSAL | Status: DC | PRN

## 2014-09-07 MED ORDER — METHOCARBAMOL 1000 MG/10ML IJ SOLN
500.0000 mg | Freq: Four times a day (QID) | INTRAVENOUS | Status: DC | PRN
  Administered 2014-09-07: 500 mg via INTRAVENOUS
  Filled 2014-09-07 (×3): qty 5

## 2014-09-07 MED ORDER — VANCOMYCIN HCL 500 MG IV SOLR
INTRAVENOUS | Status: DC | PRN
  Administered 2014-09-07 (×2): 1000 mg

## 2014-09-07 MED ORDER — ONDANSETRON HCL 4 MG PO TABS: 4.0000 mg | ORAL_TABLET | Freq: Four times a day (QID) | ORAL | Status: DC | PRN

## 2014-09-07 MED ORDER — LIDOCAINE HCL (CARDIAC) 20 MG/ML IV SOLN
INTRAVENOUS | Status: DC | PRN
  Administered 2014-09-07: 100 mg via INTRAVENOUS

## 2014-09-07 MED ORDER — FERROUS SULFATE 325 (65 FE) MG PO TABS
325.0000 mg | ORAL_TABLET | Freq: Three times a day (TID) | ORAL | Status: DC
  Administered 2014-09-08 – 2014-09-11 (×7): 325 mg via ORAL
  Filled 2014-09-07 (×8): qty 1

## 2014-09-07 MED ORDER — ONDANSETRON HCL 4 MG/2ML IJ SOLN
INTRAMUSCULAR | Status: DC | PRN
  Administered 2014-09-07: 4 mg via INTRAVENOUS

## 2014-09-07 MED ORDER — METOCLOPRAMIDE HCL 10 MG PO TABS: 5.0000 mg | ORAL_TABLET | Freq: Three times a day (TID) | ORAL | Status: DC | PRN

## 2014-09-07 MED ORDER — MAGNESIUM CITRATE PO SOLN: 1.0000 | Freq: Once | ORAL | Status: AC | PRN

## 2014-09-07 MED ORDER — HYDROMORPHONE HCL 1 MG/ML IJ SOLN
1.0000 mg | INTRAMUSCULAR | Status: DC | PRN
  Administered 2014-09-07 – 2014-09-10 (×16): 1 mg via INTRAVENOUS
  Filled 2014-09-07 (×17): qty 1

## 2014-09-07 MED ORDER — METOCLOPRAMIDE HCL 5 MG/ML IJ SOLN
5.0000 mg | Freq: Three times a day (TID) | INTRAMUSCULAR | Status: DC | PRN
  Administered 2014-09-10: 10 mg via INTRAVENOUS
  Filled 2014-09-07: qty 2

## 2014-09-07 MED ORDER — MIDAZOLAM HCL 5 MG/5ML IJ SOLN
INTRAMUSCULAR | Status: DC | PRN
  Administered 2014-09-07: 2 mg via INTRAVENOUS

## 2014-09-07 MED ORDER — ALBUMIN HUMAN 5 % IV SOLN
INTRAVENOUS | Status: DC | PRN
  Administered 2014-09-07 (×2): via INTRAVENOUS

## 2014-09-07 MED ORDER — NEOSTIGMINE METHYLSULFATE 10 MG/10ML IV SOLN
INTRAVENOUS | Status: DC | PRN
  Administered 2014-09-07: 4 mg via INTRAVENOUS

## 2014-09-07 MED ORDER — GLYCOPYRROLATE 0.2 MG/ML IJ SOLN
INTRAMUSCULAR | Status: DC | PRN
  Administered 2014-09-07: 0.6 mg via INTRAVENOUS

## 2014-09-07 MED ORDER — ROCURONIUM BROMIDE 100 MG/10ML IV SOLN
INTRAVENOUS | Status: DC | PRN
  Administered 2014-09-07: 40 mg via INTRAVENOUS

## 2014-09-07 MED ORDER — ONDANSETRON HCL 4 MG/2ML IJ SOLN
4.0000 mg | Freq: Four times a day (QID) | INTRAMUSCULAR | Status: DC | PRN
  Administered 2014-09-09 – 2014-09-10 (×2): 4 mg via INTRAVENOUS
  Filled 2014-09-07 (×3): qty 2

## 2014-09-07 MED ORDER — INSULIN ASPART 100 UNIT/ML ~~LOC~~ SOLN
6.0000 [IU] | Freq: Three times a day (TID) | SUBCUTANEOUS | Status: DC
  Administered 2014-09-11 (×2): 6 [IU] via SUBCUTANEOUS

## 2014-09-07 MED ORDER — MEPERIDINE HCL 25 MG/ML IJ SOLN: 6.2500 mg | INTRAMUSCULAR | Status: DC | PRN

## 2014-09-07 MED ORDER — OXYCODONE HCL 5 MG PO TABS
5.0000 mg | ORAL_TABLET | ORAL | Status: DC | PRN
  Administered 2014-09-07 – 2014-09-10 (×13): 10 mg via ORAL
  Administered 2014-09-10: 5 mg via ORAL
  Administered 2014-09-10 – 2014-09-11 (×5): 10 mg via ORAL
  Filled 2014-09-07 (×20): qty 2

## 2014-09-07 MED ORDER — PROPOFOL 10 MG/ML IV BOLUS
INTRAVENOUS | Status: AC
  Filled 2014-09-07: qty 20

## 2014-09-07 MED ORDER — MIDAZOLAM HCL 2 MG/2ML IJ SOLN
INTRAMUSCULAR | Status: AC
  Filled 2014-09-07: qty 2

## 2014-09-07 MED ORDER — PROPOFOL 10 MG/ML IV BOLUS
INTRAVENOUS | Status: DC | PRN
  Administered 2014-09-07: 160 mg via INTRAVENOUS

## 2014-09-07 MED ORDER — ROCURONIUM BROMIDE 50 MG/5ML IV SOLN
INTRAVENOUS | Status: AC
  Filled 2014-09-07: qty 1

## 2014-09-07 MED ORDER — GLYCOPYRROLATE 0.2 MG/ML IJ SOLN
INTRAMUSCULAR | Status: AC
  Filled 2014-09-07: qty 3

## 2014-09-07 MED ORDER — FENTANYL CITRATE 0.05 MG/ML IJ SOLN
INTRAMUSCULAR | Status: DC | PRN
  Administered 2014-09-07: 50 ug via INTRAVENOUS
  Administered 2014-09-07: 100 ug via INTRAVENOUS
  Administered 2014-09-07 (×2): 50 ug via INTRAVENOUS

## 2014-09-07 MED ORDER — SENNOSIDES-DOCUSATE SODIUM 8.6-50 MG PO TABS: 1.0000 | ORAL_TABLET | Freq: Every evening | ORAL | Status: DC | PRN

## 2014-09-07 MED ORDER — SODIUM CHLORIDE 0.9 % IR SOLN
Status: DC | PRN
  Administered 2014-09-07: 3000 mL
  Administered 2014-09-07: 1000 mL

## 2014-09-07 MED ORDER — INSULIN DETEMIR 100 UNIT/ML FLEXPEN: 45.0000 [IU] | PEN_INJECTOR | Freq: Every day | SUBCUTANEOUS | Status: DC

## 2014-09-07 MED ORDER — LACTATED RINGERS IV SOLN
INTRAVENOUS | Status: DC
  Administered 2014-09-07 (×2): via INTRAVENOUS

## 2014-09-07 MED ORDER — ACETAMINOPHEN 650 MG RE SUPP: 650.0000 mg | Freq: Four times a day (QID) | RECTAL | Status: DC | PRN

## 2014-09-07 MED ORDER — ALUM & MAG HYDROXIDE-SIMETH 200-200-20 MG/5ML PO SUSP: 30.0000 mL | ORAL | Status: DC | PRN

## 2014-09-07 MED ORDER — INSULIN DETEMIR 100 UNIT/ML ~~LOC~~ SOLN
45.0000 [IU] | Freq: Every day | SUBCUTANEOUS | Status: DC
  Administered 2014-09-08 – 2014-09-10 (×4): 45 [IU] via SUBCUTANEOUS
  Filled 2014-09-07 (×5): qty 0.45

## 2014-09-07 MED ORDER — ASPIRIN EC 325 MG PO TBEC
325.0000 mg | DELAYED_RELEASE_TABLET | Freq: Every day | ORAL | Status: DC
  Administered 2014-09-09 – 2014-09-11 (×3): 325 mg via ORAL
  Filled 2014-09-07 (×3): qty 1

## 2014-09-07 MED ORDER — DEXAMETHASONE SODIUM PHOSPHATE 10 MG/ML IJ SOLN
INTRAMUSCULAR | Status: DC | PRN
  Administered 2014-09-07: 4 mg via INTRAVENOUS

## 2014-09-07 MED ORDER — ACETAMINOPHEN 325 MG PO TABS
650.0000 mg | ORAL_TABLET | Freq: Four times a day (QID) | ORAL | Status: DC | PRN
  Administered 2014-09-09 – 2014-09-10 (×2): 650 mg via ORAL
  Filled 2014-09-07 (×2): qty 2

## 2014-09-07 MED ORDER — DEXAMETHASONE SODIUM PHOSPHATE 10 MG/ML IJ SOLN
INTRAMUSCULAR | Status: AC
  Filled 2014-09-07: qty 1

## 2014-09-07 MED ORDER — DIPHENHYDRAMINE HCL 12.5 MG/5ML PO ELIX: 12.5000 mg | ORAL_SOLUTION | ORAL | Status: DC | PRN

## 2014-09-07 MED ORDER — PHENYLEPHRINE HCL 10 MG/ML IJ SOLN
INTRAMUSCULAR | Status: DC | PRN
  Administered 2014-09-07 (×2): 40 ug via INTRAVENOUS

## 2014-09-07 MED ORDER — SUCCINYLCHOLINE CHLORIDE 20 MG/ML IJ SOLN
INTRAMUSCULAR | Status: AC
  Filled 2014-09-07: qty 1

## 2014-09-07 MED ORDER — SODIUM CHLORIDE 0.9 % IV SOLN
INTRAVENOUS | Status: DC
  Administered 2014-09-07: 21:00:00 via INTRAVENOUS

## 2014-09-07 MED ORDER — BISACODYL 5 MG PO TBEC
5.0000 mg | DELAYED_RELEASE_TABLET | Freq: Every day | ORAL | Status: DC | PRN
Start: 2014-09-07 — End: 2014-09-11
  Filled 2014-09-07: qty 1

## 2014-09-07 MED ORDER — METHOCARBAMOL 500 MG PO TABS
500.0000 mg | ORAL_TABLET | Freq: Four times a day (QID) | ORAL | Status: DC | PRN
  Administered 2014-09-08 – 2014-09-10 (×11): 500 mg via ORAL
  Filled 2014-09-07 (×13): qty 1

## 2014-09-07 MED ORDER — LIDOCAINE HCL (CARDIAC) 20 MG/ML IV SOLN
INTRAVENOUS | Status: AC
  Filled 2014-09-07: qty 15

## 2014-09-07 SURGICAL SUPPLY — 65 items
ADAPTER THREAD CARTRIDGE (ADAPTER) ×4 IMPLANT
BLADE SURG 10 STRL SS (BLADE) ×4 IMPLANT
BONE CEMENT PALACOS R-G (Orthopedic Implant) ×8 IMPLANT
BRUSH FEMORAL CANAL (MISCELLANEOUS) IMPLANT
CEMENT BONE PALACOS R-G (Orthopedic Implant) ×4 IMPLANT
COVER BACK TABLE 24X17X13 BIG (DRAPES) IMPLANT
DRAPE IMP U-DRAPE 54X76 (DRAPES) ×2 IMPLANT
DRAPE INCISE IOBAN 85X60 (DRAPES) ×4 IMPLANT
DRAPE ORTHO SPLIT 77X108 STRL (DRAPES) ×2
DRAPE SURG ORHT 6 SPLT 77X108 (DRAPES) ×2 IMPLANT
DRAPE U-SHAPE 47X51 STRL (DRAPES) ×2 IMPLANT
DRSG ADAPTIC 3X8 NADH LF (GAUZE/BANDAGES/DRESSINGS) IMPLANT
DRSG MEPILEX BORDER 4X12 (GAUZE/BANDAGES/DRESSINGS) ×2 IMPLANT
DRSG PAD ABDOMINAL 8X10 ST (GAUZE/BANDAGES/DRESSINGS) ×2 IMPLANT
DURAPREP 26ML APPLICATOR (WOUND CARE) ×4 IMPLANT
ELECT BLADE 4.0 EZ CLEAN MEGAD (MISCELLANEOUS) ×2
ELECT BLADE 6.5 EXT (BLADE) IMPLANT
ELECT CAUTERY BLADE 6.4 (BLADE) ×2 IMPLANT
ELECT REM PT RETURN 9FT ADLT (ELECTROSURGICAL) ×2
ELECTRODE BLDE 4.0 EZ CLN MEGD (MISCELLANEOUS) ×1 IMPLANT
ELECTRODE REM PT RTRN 9FT ADLT (ELECTROSURGICAL) ×1 IMPLANT
EVACUATOR 1/8 PVC DRAIN (DRAIN) IMPLANT
GAUZE SPONGE 4X4 12PLY STRL (GAUZE/BANDAGES/DRESSINGS) IMPLANT
GLOVE BIOGEL PI IND STRL 9 (GLOVE) ×1 IMPLANT
GLOVE BIOGEL PI INDICATOR 9 (GLOVE) ×1
GLOVE SURG ORTHO 9.0 STRL STRW (GLOVE) ×2 IMPLANT
GOWN STRL REUS W/ TWL XL LVL3 (GOWN DISPOSABLE) ×3 IMPLANT
GOWN STRL REUS W/TWL XL LVL3 (GOWN DISPOSABLE) ×3
HANDPIECE INTERPULSE COAX TIP (DISPOSABLE)
HIP SPACER NECK ADAPTER +6MM (Hips) ×2 IMPLANT
HIP SPACER W/INSERT 48MM (Hips) ×2 IMPLANT
HIP STEM CEMENT SPACER MOLD (Hips) ×2 IMPLANT
IMMOBILIZER KNEE 20 (SOFTGOODS) IMPLANT
IMMOBILIZER KNEE 22 (SOFTGOODS) ×2 IMPLANT
IMMOBILIZER KNEE 22 UNIV (SOFTGOODS) IMPLANT
IMMOBILIZER KNEE 24 THIGH 36 (MISCELLANEOUS) IMPLANT
IMMOBILIZER KNEE 24 UNIV (MISCELLANEOUS)
KIT BASIN OR (CUSTOM PROCEDURE TRAY) ×2 IMPLANT
KIT ROOM TURNOVER OR (KITS) ×2 IMPLANT
MANIFOLD NEPTUNE II (INSTRUMENTS) ×2 IMPLANT
NEEDLE SPNL 18GX3.5 QUINCKE PK (NEEDLE) ×2 IMPLANT
NS IRRIG 1000ML POUR BTL (IV SOLUTION) ×2 IMPLANT
PACK TOTAL JOINT (CUSTOM PROCEDURE TRAY) ×2 IMPLANT
PACK UNIVERSAL I (CUSTOM PROCEDURE TRAY) ×2 IMPLANT
PAD ARMBOARD 7.5X6 YLW CONV (MISCELLANEOUS) ×4 IMPLANT
PRESSURIZER FEMORAL UNIV (MISCELLANEOUS) IMPLANT
SET HNDPC FAN SPRY TIP SCT (DISPOSABLE) IMPLANT
SPONGE LAP 18X18 X RAY DECT (DISPOSABLE) ×2 IMPLANT
SPONGE LAP 4X18 X RAY DECT (DISPOSABLE) IMPLANT
STAPLER VISISTAT 35W (STAPLE) ×2 IMPLANT
SUCTION FRAZIER TIP 10 FR DISP (SUCTIONS) ×2 IMPLANT
SUT ETHIBOND NAB CT1 #1 30IN (SUTURE) ×4 IMPLANT
SUT ETHILON 2 0 PSLX (SUTURE) ×6 IMPLANT
SUT VIC AB 1 CTB1 27 (SUTURE) ×4 IMPLANT
SUT VIC AB 2-0 CTB1 (SUTURE) ×4 IMPLANT
SWAB COLLECTION DEVICE MRSA (MISCELLANEOUS) ×4 IMPLANT
SYSTEM VACUUM CEMENT MIXING ×4 IMPLANT
TOWEL OR 17X24 6PK STRL BLUE (TOWEL DISPOSABLE) ×2 IMPLANT
TOWEL OR 17X26 10 PK STRL BLUE (TOWEL DISPOSABLE) ×2 IMPLANT
TOWER CARTRIDGE SMART MIX (DISPOSABLE) IMPLANT
TRAY FOLEY CATH 16FRSI W/METER (SET/KITS/TRAYS/PACK) IMPLANT
TUBE ANAEROBIC SPECIMEN COL (MISCELLANEOUS) ×4 IMPLANT
WATER STERILE IRR 1000ML POUR (IV SOLUTION) IMPLANT
YANKAUER SUCT BULB TIP NO VENT (SUCTIONS) ×2 IMPLANT
ZIMMER COMPACT VACUUM CEMENT MIXING SYSTEM ×2 IMPLANT

## 2014-09-07 NOTE — Anesthesia Preprocedure Evaluation (Addendum)
Anesthesia Evaluation  Patient identified by MRN, date of birth, ID band Patient awake    Reviewed: Allergy & Precautions, NPO status , Patient's Chart, lab work & pertinent test results, reviewed documented beta blocker date and time   History of Anesthesia Complications (+) PONV  Airway        Dental   Pulmonary          Cardiovascular hypertension, Pt. on medications     Neuro/Psych Seizures -, Well Controlled,  Anxiety    GI/Hepatic negative GI ROS, Neg liver ROS,   Endo/Other  diabetes, Type 2  Renal/GU      Musculoskeletal   Abdominal   Peds  Hematology   Anesthesia Other Findings   Reproductive/Obstetrics                            Anesthesia Physical Anesthesia Plan  ASA: III  Anesthesia Plan: General   Post-op Pain Management:    Induction: Intravenous  Airway Management Planned: Oral ETT and Video Laryngoscope Planned  Additional Equipment:   Intra-op Plan:   Post-operative Plan: Extubation in OR  Informed Consent: I have reviewed the patients History and Physical, chart, labs and discussed the procedure including the risks, benefits and alternatives for the proposed anesthesia with the patient or authorized representative who has indicated his/her understanding and acceptance.     Plan Discussed with:   Anesthesia Plan Comments: (GA with intubation 1 month ago)       Anesthesia Quick Evaluation

## 2014-09-07 NOTE — H&P (Signed)
Kelly Richardson is an 64 y.o. female.   Chief Complaint: Draining abscess right total hip arthroplasty HPI: Patient is a 64 year old woman who is status post remote total hip arthroplasty in 2011. Patient developed ulceration last year underwent partial revision of the total hip arthroplasty. Patient's cultures were negative at that time. Patient presents at this time with recurrent abscess ulceration and drainage from the right hip. Initial cultures are negative however patient does have bony changes of a calcar consistent with osteomyelitis.  Past Medical History  Diagnosis Date  . Diabetes mellitus without complication   . Complication of anesthesia   . PONV (postoperative nausea and vomiting)   . Hypertension   . Heart murmur     as a child  . 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 Arthritis   . Shortness of breath dyspnea     sometimes  at night    Past Surgical History  Procedure Laterality Date  . Total hip arthroplasty Right 2011  . Abdominal hysterectomy    . Cholecystectomy  1995  . Tonsillectomy    . Incision and drainage hip Right 06/08/2014    Procedure: Irrigation and Debridement Right Hip, Revision of Cup and Head of Total Hip with  Placement  of Antibiotic Spacer;  Surgeon: Nadara MustardMarcus Duda V, MD;  Location: [facility] OR;  Service: Orthopedics;  Laterality: Right;    History reviewed. No pertinent family history. Social History:  reports that she has never smoked. She does not have any smokeless tobacco history on file. She reports that she does not drink alcohol or use illicit drugs.  Allergies:  Allergies  Allergen Reactions  . Penicillins Anaphylaxis  . Seldane [Terfenadine] Other (See Comments)    N/V, increased heart rate, seizures, hallinations.  . Sulfonamide Derivatives Anaphylaxis  . Other Nausea And Vomiting    "Anything I have ever been given more nausea just increases it."  . Iohexol      Desc: pt states she had a ct [place] in 2000 and  immediately experienced profuse vomiting and sweating.  She said they treated her like she was having a reaction and doesn't remember much else or if meds were given., Onset Date: [id number]   . Latex Other (See Comments)    BLISTERING  . Metformin Other (See Comments)    REACTION: faintness, DIZZINESS ,  . Vicks Formula 44 Cough Relief [Dextromethorphan Hbr] Other (See Comments)    Runs blood pressure up   . Xanax [Alprazolam]     Pt does not want Xanax--history of addiction.    No prescriptions prior to admission    No results found for this or any previous visit (from the past 48 hour(s)). No results found.  Review of Systems  All other systems reviewed and are negative.   There were no vitals taken for this visit. 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  Plan: We will plan for complete removal of all total hip arthroplasty components we'll obtain tissue samples for cultures. Plan for placing an antibiotic spacer plan for discharge to skilled nursing. Plan for PICC line placement.  DUDA,MARCUS V 09/07/2014, 6:31 AM

## 2014-09-07 NOTE — Op Note (Signed)
09/07/2014  8:17 PM  PATIENT:  Johnney KillianAndrea J Leppert    PRE-OPERATIVE DIAGNOSIS:  Infected Right Total Hip Arthroplasty  POST-OPERATIVE DIAGNOSIS:  Same  PROCEDURE:  Excision Total Hip Arthroplasty, Place Antibiotic Spacer both acetabular and femoral components removed.  SURGEON:  Nadara MustardUDA,Leydy Worthey V, MD  PHYSICIAN ASSISTANT:None ANESTHESIA:   General  PREOPERATIVE INDICATIONS:  Katherene Pontondrea J Auten is a  64 y.o. female with a diagnosis of Infected Right Total Hip Arthroplasty who failed conservative measures and elected for surgical management.    The risks benefits and alternatives were discussed with the patient preoperatively including but not limited to the risks of infection, bleeding, nerve injury, cardiopulmonary complications, the need for revision surgery, among others, and the patient was willing to proceed.  OPERATIVE IMPLANTS: A custom molded antibiotic total hip with a size 48 mm acetabulum and a size 9 femur. Zimmer components with gentamicin and vancomycin in the antibiotic cement  OPERATIVE FINDINGS: Infection seemed to originate from the femoral shaft. Tissue cultures and deep cultures obtained.  OPERATIVE PROCEDURE: Patient was brought to the operating room and underwent a general anesthetic. After adequate levels of anesthesia were obtained patient was placed the left lateral decubitus position with the right side up in the right lower extremity was prepped using DuraPrep draped into a sterile field Ioban was used to cover all exposed skin. A timeout was called. A elliptical incision was made around the ulcerative area on her thigh. This was carried down to the tensor fascia lata which was split. This was carried directly down to the joint the hip was dislocated. The head and neck were removed. This was focused on the acetabulum. The liner was removed. The screw was removed and the acetabular component was removed. The acetabulum was debrided with a large curet. Pulsatile lavage was used  throughout the case. Using flexible osteotomes the femoral component was then loosened with a flexible osteotome this was then removed without complications there was a small crack in the calcar but this did not appear to be structural. The femoral canal was then reamed up for a size 9 implant. The components were then prepared on the back table cement with antibiotic both vancomycin and gentamicin was prepared and this was injected into the mold. Once the mold was completed the stem was inserted the monopolar head was inserted and impacted. The hip was reduced and stable. The wound was irrigated further with normal saline. Incision was closed using 2-0 nylon. Patient was extubated taken to the PACU in stable condition.

## 2014-09-07 NOTE — Anesthesia Postprocedure Evaluation (Signed)
Anesthesia Post Note  Patient: Kelly Richardson  Procedure(s) Performed: Procedure(s) (LRB): Excision Total Hip Arthroplasty, Place Antibiotic Spacer (Right)  Anesthesia type: general  Patient location: PACU  Post pain: Pain level controlled  Post assessment: Patient's Cardiovascular Status Stable  Last Vitals:  Filed Vitals:   09/07/14 1700  BP:   Pulse: 76  Temp:   Resp: 14    Post vital signs: Reviewed and stable  Level of consciousness: sedated  Complications: No apparent anesthesia complications

## 2014-09-07 NOTE — Transfer of Care (Signed)
Immediate Anesthesia Transfer of Care Note  Patient: Kelly Richardson  Procedure(s) Performed: Procedure(s): Excision Total Hip Arthroplasty, Place Antibiotic Spacer (Right)  Patient Location: PACU  Anesthesia Type:General  Level of Consciousness: sedated  Airway & Oxygen Therapy: Patient Spontanous Breathing and Patient connected to face mask oxygen  Post-op Assessment: Report given to RN, Post -op Vital signs reviewed and stable and Patient moving all extremities X 4  Post vital signs: Reviewed and stable  Last Vitals:  Filed Vitals:   09/07/14 1222  Pulse: 100  Temp: 36.1 C  Resp: 18  BP 132/73, RR 12, Sats 100%, HR 83  Complications: No apparent anesthesia complications

## 2014-09-07 NOTE — Progress Notes (Signed)
Infection seemed to originate from the femoral shaft. Deep fluid cultures were obtained from within the femoral shaft. Tissue cultures were obtained from the necrotic tissue along the sinus draining tract. Plan for infectious disease consultation plan for PICC line placement anticipate discharge to skilled nursing early next week

## 2014-09-07 NOTE — Anesthesia Procedure Notes (Signed)
Procedure Name: Intubation Date/Time: 09/07/2014 2:05 PM Performed by: Elon AlasLEE, Sharleen Szczesny BROWN Pre-anesthesia Checklist: Patient identified, Timeout performed, Emergency Drugs available, Suction available and Patient being monitored Patient Re-evaluated:Patient Re-evaluated prior to inductionOxygen Delivery Method: Circle system utilized Preoxygenation: Pre-oxygenation with 100% oxygen Intubation Type: IV induction and Cricoid Pressure applied Ventilation: Mask ventilation without difficulty and Oral airway inserted - appropriate to patient size Laryngoscope Size: Mac and 3 Grade View: Grade II Tube type: Oral Tube size: 7.0 mm Number of attempts: 1 Airway Equipment and Method: Stylet Placement Confirmation: CO2 detector,  positive ETCO2,  ETT inserted through vocal cords under direct vision and breath sounds checked- equal and bilateral Secured at: 21 cm Tube secured with: Tape Dental Injury: Teeth and Oropharynx as per pre-operative assessment

## 2014-09-07 NOTE — Progress Notes (Signed)
Orthopedic Tech Progress Note Patient Details:  Kelly Richardson 10-Oct-1950 [id number] Applied hip abduction pillow. Ortho Devices Type of Ortho Device: Abduction pillow Ortho Device/Splint Interventions: Other (comment)   Lesle ChrisGilliland, Trendon Zaring L 09/07/2014, 4:38 PM

## 2014-09-08 LAB — GLUCOSE, CAPILLARY
GLUCOSE-CAPILLARY: 224 mg/dL — AB (ref 70–99)
GLUCOSE-CAPILLARY: 175 mg/dL — AB (ref 70–99)
Glucose-Capillary: 154 mg/dL — ABNORMAL HIGH (ref 70–99)
Glucose-Capillary: 178 mg/dL — ABNORMAL HIGH (ref 70–99)

## 2014-09-08 LAB — BASIC METABOLIC PANEL
Anion gap: 7 (ref 5–15)
BUN: 20 mg/dL (ref 6–23)
CO2: 25 mmol/L (ref 19–32)
CREATININE: 1.25 mg/dL — AB (ref 0.50–1.10)
Calcium: 8.3 mg/dL — ABNORMAL LOW (ref 8.4–10.5)
Chloride: 102 mmol/L (ref 96–112)
GFR calc Af Amer: 53 mL/min — ABNORMAL LOW (ref >90)
GFR, EST NON AFRICAN AMERICAN: 45 mL/min — AB (ref >90)
Glucose, Bld: 237 mg/dL — ABNORMAL HIGH (ref 70–99)
Potassium: 5.3 mmol/L — ABNORMAL HIGH (ref 3.5–5.1)
Sodium: 134 mmol/L — ABNORMAL LOW (ref 135–145)

## 2014-09-08 LAB — CBC WITH DIFFERENTIAL/PLATELET
BASOS ABS: 0 10*3/uL (ref 0.0–0.1)
BASOS PCT: 0 % (ref 0–1)
EOS ABS: 0 10*3/uL (ref 0.0–0.7)
Eosinophils Relative: 0 % (ref 0–5)
HEMATOCRIT: 21.4 % — AB (ref 36.0–46.0)
Hemoglobin: 6.7 g/dL — CL (ref 12.0–15.0)
Lymphocytes Relative: 16 % (ref 12–46)
Lymphs Abs: 1.6 10*3/uL (ref 0.7–4.0)
MCH: 24.7 pg — ABNORMAL LOW (ref 26.0–34.0)
MCHC: 31.3 g/dL (ref 30.0–36.0)
MCV: 79 fL (ref 78.0–100.0)
MONO ABS: 0.6 10*3/uL (ref 0.1–1.0)
Monocytes Relative: 6 % (ref 3–12)
Neutro Abs: 8 10*3/uL — ABNORMAL HIGH (ref 1.7–7.7)
Neutrophils Relative %: 78 % — ABNORMAL HIGH (ref 43–77)
Platelets: 256 10*3/uL (ref 150–400)
RBC: 2.71 MIL/uL — ABNORMAL LOW (ref 3.87–5.11)
RDW: 15.4 % (ref 11.5–15.5)
WBC: 10.2 10*3/uL (ref 4.0–10.5)

## 2014-09-08 LAB — PREPARE RBC (CROSSMATCH)

## 2014-09-08 MED ORDER — SODIUM CHLORIDE 0.9 % IV SOLN: Freq: Once | INTRAVENOUS | Status: DC

## 2014-09-08 MED ORDER — SODIUM CHLORIDE 0.9 % IJ SOLN
10.0000 mL | INTRAMUSCULAR | Status: DC | PRN
  Administered 2014-09-09 – 2014-09-11 (×4): 10 mL
  Filled 2014-09-08 (×4): qty 40

## 2014-09-08 NOTE — Progress Notes (Signed)
Peripherally Inserted Central Catheter/Midline Placement  The IV Nurse has discussed with the patient and/or persons authorized to consent for the patient, the purpose of this procedure and the potential benefits and risks involved with this procedure.  The benefits include less needle sticks, lab draws from the catheter and patient may be discharged home with the catheter.  Risks include, but not limited to, infection, bleeding, blood clot (thrombus formation), and puncture of an artery; nerve damage and irregular heat beat.  Alternatives to this procedure were also discussed.  PICC/Midline Placement Documentation  PICC / Midline Single Lumen 09/08/14 PICC Right Basilic 36 cm 1 cm (Active)  Indication for Insertion or Continuance of Line Home intravenous therapies (PICC only) 09/08/2014  8:37 AM  Exposed Catheter (cm) 1 cm 09/08/2014  8:37 AM  Site Assessment Clean;Dry;Intact 09/08/2014  8:37 AM  Line Status Flushed;Saline locked;Blood return noted 09/08/2014  8:37 AM  Dressing Type Transparent 09/08/2014  8:37 AM  Dressing Status Clean;Dry;Intact;Antimicrobial disc in place 09/08/2014  8:37 AM  Line Care Connections checked and tightened 09/08/2014  8:37 AM  Line Adjustment (NICU/IV Team Only) Yes 09/08/2014  8:37 AM  Dressing Intervention New dressing 09/08/2014  8:37 AM  Dressing Change Due 09/15/14 09/08/2014  8:37 AM       Elliot Dallyiggs, Nesta Kimple Wright 09/08/2014, 8:38 AM

## 2014-09-08 NOTE — Progress Notes (Signed)
Subjective: 1 Day Post-Op Procedure(s) (LRB): Excision Total Hip Arthroplasty, Place Antibiotic Spacer (Right) Patient reports pain as 9 on 0-10 scale.    Objective: Vital signs in last 24 hours: Temp:  [97 F (36.1 C)-98.5 F (36.9 C)] 98.5 F (36.9 C) (03/05 0612) Pulse Rate:  [73-100] 90 (03/05 0612) Resp:  [13-18] 16 (03/05 0612) BP: (97-132)/(53-73) 99/60 mmHg (03/05 0612) SpO2:  [96 %-100 %] 96 % (03/05 0612) Weight:  [98.884 kg (218 lb)] 98.884 kg (218 lb) (03/04 1222)  Intake/Output from previous day: 03/04 0701 - 03/05 0700 In: 2823.3 [P.O.:630; I.V.:1588.3; IV Piggyback:605] Out: 1000 [Blood:1000] Intake/Output this shift:     Recent Labs  09/07/14 1308 09/07/14 1655 09/08/14 0413  HGB 11.3* 8.3* 6.7*    Recent Labs  09/07/14 1655 09/08/14 0413  WBC 13.3* 10.2  RBC 3.37* 2.71*  HCT 26.2* 21.4*  PLT 279 256    Recent Labs  09/07/14 1308 09/08/14 0413  NA 139 134*  K 4.5 5.3*  CL 102 102  CO2 24 25  BUN 17 20  CREATININE 1.08 1.25*  GLUCOSE 146* 237*  CALCIUM 9.8 8.3*    Recent Labs  09/07/14 1308  INR 1.02    Neurologically intact  Assessment/Plan: 1 Day Post-Op Procedure(s) (LRB): Excision Total Hip Arthroplasty, Place Antibiotic Spacer (Right) Up with therapy  Pain will be a problem.   Lajada Janes C 09/08/2014, 9:09 AM

## 2014-09-08 NOTE — Progress Notes (Signed)
OT Cancellation Note  Patient Details Name: Katherene Pontondrea J Falletta MRN: [id number] DOB: August 02, 1950   Cancelled Treatment:    Reason Eval/Treat Not Completed: Medical issues which prohibited therapy. Pt with low Hbg today and waiting on blood transfusions. Acute OT to hold until pt medically ready.   Nena JordanMiller, Kendale Rembold M   Carney LivingLeeAnn Marie Candra Wegner, OTR/L Occupational Therapist [phone] (pager)  09/08/2014, 3:54 PM

## 2014-09-08 NOTE — Progress Notes (Signed)
Paged on call Dr at this time regarding critical hgb level of 6.7 this morning

## 2014-09-08 NOTE — Clinical Social Work Placement (Addendum)
Clinical Social Work Department CLINICAL SOCIAL WORK PLACEMENT NOTE 09/08/2014  Patient:  Kelly PontoENLAND,[name]  Account Number:  [id number] Admit date:  09/07/2014  Clinical Social Worker:  Vivi Barrackrystal Patrick-jefferson, LCSWA  Date/time:  09/08/2014 05:39 PM  Clinical Social Work is seeking post-discharge placement for this patient at the following level of care:   SKILLED NURSING   (*CSW will update this form in Epic as items are completed)   09/08/2014  [facility]/family provided with [facility] of Clinical Social Work's list of facilities offering this level of care within the geographic area requested by the patient (or if unable, by the patient's family).  09/08/2014  Patient/family informed of their freedom to choose among providers that offer the needed level of care, that participate in Medicare, Medicaid or managed care program needed by the patient, have an available bed and are willing to accept the patient.  09/08/2014  Patient/family informed of [facility]' ownership interest in [facility], as well as of the fact that they are under no obligation to receive care at this facility.  PASARR submitted to EDS on Existing PASARR number received on Existing  FL2 transmitted to all facilities in geographic area requested by pt/family on  09/08/2014 FL2 transmitted to all facilities within larger geographic area on   Patient informed that his/her managed care company has contracts with or will negotiate with  certain facilities, including the following:     Patient/family informed of bed offers received:  09/10/2014 [facility] Physician recommends and patient chooses bed at  [facility]A  [facility] on  09/11/2014 Patient to be transferred to facility by [facility] Patient and family notified of transfer on: 09/11/2014  Name of family notified: patient is alert and oriented and states will update husband who is currently  sleeping   Vivi Barrackrystal Patrick-Jefferson, LCSWA Weekend Clinical Social Worker [phone] mily member notified:    The following physician request were entered in Epic:   Additional Comments:

## 2014-09-08 NOTE — Clinical Social Work Psychosocial (Signed)
Clinical Social Work Department BRIEF PSYCHOSOCIAL ASSESSMENT 09/08/2014  Patient:  Kelly Richardson, Kelly Richardson     Account Number:  [id number]     Admit date:  09/07/2014  Clinical Social Worker:  Hubert Azure  Date/Time:  09/08/2014 05:40 PM  Referred by:  Physician  Date Referred:  09/08/2014 Referred for  SNF Placement   Other Referral:   Interview type:  Patient Other interview type:    PSYCHOSOCIAL DATA Living Status:  SIGNIFICANT OTHER Admitted from facility:   Level of care:   Primary support name:  Kelly Richardson [phone]) Primary support relationship to patient:  PARTNER Degree of support available:   Good. Significant other at bedside during assessment.    CURRENT CONCERNS Current Concerns  Post-Acute Placement   Other Concerns:    SOCIAL WORK ASSESSMENT / PLAN CSW met with patient who was alert and oriented. Patient's significant other Kelly Richardson) was present at bedside. CSW introduced self and explained role. CSW explained SNF placement process and discussed d/c plan with patient. Per patient, she has been to [facility] in the past. Patient states she does not want to go to [facility], but is interested in [facility] as well as [facility]. Patient inquired about private room at facility as she had a private room while at [facility]. CSW acknowledged patient's concern for privacy and educated her on Medicare guidelines which cover cost for semi-private room. CSW informed patient request will be submitted to facilities, but a private room is not a guarantee and she may have to private pay for the additional cost.   Assessment/plan status:  Other - See comment Other assessment/ plan:   CSW to submit PASARR and complete FL2 for placement.   Information/referral to community resources:    PATIENT'S/FAMILY'S RESPONSE TO PLAN OF CARE: Patient is agreeable to SNF placement as she states, "I've been down this road before." Patient was happy her significant other has been  "by my side the whole time, following me wherever I go."     [employer], [place] Weekend Clinical Social Worker [phone]

## 2014-09-08 NOTE — Progress Notes (Signed)
Patient ID: Kelly Richardson, female   DOB: 24-Sep-1950, 64 y.o.   MRN: [id number] Discussed with Dr. Lajoyce Cornersuda . He wants TDWB due to poor femur bone quality .

## 2014-09-08 NOTE — Progress Notes (Signed)
Orders received by Dr Annell GreeningMark Yates to transfuse patient with 2 units  PRBCs. Pt was informed of critical lab and plan of care. Pt signed consent form. Lab notified this RN we need new type and screen results. Orders placed. Pending lab to arrive to unit to draw blood.

## 2014-09-08 NOTE — Progress Notes (Signed)
PT Cancellation Note  Patient Details Name: Kelly Richardson MRN: [id number] DOB: 24-Oct-1950   Cancelled Treatment:    Reason Eval/Treat Not Completed: Medical issues which prohibited therapy H&H very low, waiting on blood transfusions.  Called physician to clarify WB orders as well. (PWB, order to follow).  Freida BusmanAllen, Geo Slone L 09/08/2014, 9:45 AM

## 2014-09-09 LAB — CBC WITH DIFFERENTIAL/PLATELET
Basophils Absolute: 0 10*3/uL (ref 0.0–0.1)
Basophils Relative: 0 % (ref 0–1)
EOS ABS: 0.2 10*3/uL (ref 0.0–0.7)
EOS PCT: 1 % (ref 0–5)
HEMATOCRIT: 27 % — AB (ref 36.0–46.0)
Hemoglobin: 8.7 g/dL — ABNORMAL LOW (ref 12.0–15.0)
LYMPHS ABS: 2.7 10*3/uL (ref 0.7–4.0)
Lymphocytes Relative: 21 % (ref 12–46)
MCH: 25.8 pg — AB (ref 26.0–34.0)
MCHC: 32.2 g/dL (ref 30.0–36.0)
MCV: 80.1 fL (ref 78.0–100.0)
MONO ABS: 1.5 10*3/uL — AB (ref 0.1–1.0)
Monocytes Relative: 11 % (ref 3–12)
NEUTROS ABS: 8.9 10*3/uL — AB (ref 1.7–7.7)
Neutrophils Relative %: 67 % (ref 43–77)
Platelets: 253 10*3/uL (ref 150–400)
RBC: 3.37 MIL/uL — ABNORMAL LOW (ref 3.87–5.11)
RDW: 15.3 % (ref 11.5–15.5)
WBC: 13.4 10*3/uL — ABNORMAL HIGH (ref 4.0–10.5)

## 2014-09-09 LAB — WOUND CULTURE: Culture: NO GROWTH

## 2014-09-09 LAB — BASIC METABOLIC PANEL
Anion gap: 4 — ABNORMAL LOW (ref 5–15)
BUN: 25 mg/dL — ABNORMAL HIGH (ref 6–23)
CALCIUM: 8.3 mg/dL — AB (ref 8.4–10.5)
CHLORIDE: 102 mmol/L (ref 96–112)
CO2: 27 mmol/L (ref 19–32)
CREATININE: 1.14 mg/dL — AB (ref 0.50–1.10)
GFR calc Af Amer: 59 mL/min — ABNORMAL LOW (ref >90)
GFR calc non Af Amer: 51 mL/min — ABNORMAL LOW (ref >90)
Glucose, Bld: 225 mg/dL — ABNORMAL HIGH (ref 70–99)
Potassium: 3.9 mmol/L (ref 3.5–5.1)
SODIUM: 133 mmol/L — AB (ref 135–145)

## 2014-09-09 LAB — GLUCOSE, CAPILLARY
GLUCOSE-CAPILLARY: 211 mg/dL — AB (ref 70–99)
Glucose-Capillary: 156 mg/dL — ABNORMAL HIGH (ref 70–99)
Glucose-Capillary: 181 mg/dL — ABNORMAL HIGH (ref 70–99)

## 2014-09-09 MED ORDER — WHITE PETROLATUM GEL
Status: AC
  Filled 2014-09-09: qty 1

## 2014-09-09 MED ORDER — BISACODYL 10 MG RE SUPP
10.0000 mg | Freq: Once | RECTAL | Status: AC
  Administered 2014-09-09: 10 mg via RECTAL
  Filled 2014-09-09: qty 1

## 2014-09-09 MED ORDER — FLEET ENEMA 7-19 GM/118ML RE ENEM
1.0000 | ENEMA | Freq: Every day | RECTAL | Status: DC | PRN
Start: 2014-09-09 — End: 2014-09-11

## 2014-09-09 NOTE — Evaluation (Signed)
Physical Therapy Evaluation Patient Details Name: Kelly Richardson MRN: [id number] DOB: [birth year] Today's Date: 09/09/2014   History of Present Illness  64 y.o. female with a diagnosis of Infected Right Total Hip Arthroplasty; pt s/p Rt hip antibiotic spacer placement 09/07/14.Marland Kitchen   Past Medical History  Diagnosis Date  . Diabetes mellitus without complication   . Complication of anesthesia   . PONV (postoperative nausea and vomiting)   . Hypertension   . Heart murmur     as a child  . 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 Arthritis   . Shortness of breath dyspnea     sometimes  at night   Past Surgical History  Procedure Laterality Date  . Total hip arthroplasty Right 2011  . Abdominal hysterectomy    . Cholecystectomy  1995  . Tonsillectomy    . Incision and drainage hip Right 06/08/2014    Procedure: Irrigation and Debridement Right Hip, Revision of Cup and Head of Total Hip with  Placement  of Antibiotic Spacer;  Surgeon: Nadara Mustard, MD;  Location: [facility] OR;  Service: Orthopedics;  Laterality: Right;     Clinical Impression  Patient is s/p above surgery resulting in functional limitations due to the deficits listed below (see PT Problem List).  Patient will benefit from skilled PT to increase their independence and safety with mobility to allow discharge to the venue listed below.       Follow Up Recommendations SNF    Equipment Recommendations  Rolling walker with 5" wheels;3in1 (PT)    Recommendations for Other Services       Precautions / Restrictions Precautions Precautions: Posterior Hip;Fall Precaution Booklet Issued: Yes (comment) Restrictions RLE Weight Bearing: Touchdown weight bearing      Mobility  Bed Mobility Overal bed mobility: +2 for physical assistance;Needs Assistance Bed Mobility: Supine to Sit     Supine to sit: +2 for physical assistance;Max assist     General bed mobility comments: Step-by step cues for technique;  close guard fo rpost precautions; required assist for all movement of RLE towards EOB; used bed pad to square off hips at EOB; very anxious with anticipation of pain  Transfers Overall transfer level: Needs assistance Equipment used: Rolling walker (2 wheeled) Transfers: Sit to/from [employer] Sit to Stand: +2 physical assistance;Mod assist Stand pivot transfers: +2 physical assistance;Mod assist       General transfer comment: Cues for safety, hand placement and technqiue; Heavy mod assist with most of support given on R side for lift; once up, pt unable to effectively unweigh RLE to TWB for stepping, so performed a basic pivot  Ambulation/Gait                Stairs            Wheelchair Mobility    Modified Rankin (Stroke Patients Only)       Balance Overall balance assessment: Needs assistance           Standing balance-Leahy Scale: Poor                               Pertinent Vitals/Pain Pain Assessment: 0-10 Pain Score: 8  Pain Location: R hip spasm Pain Descriptors / Indicators: Spasm Pain Intervention(s): Limited activity within patient's tolerance;Monitored during session;Premedicated before session;Repositioned    Home Living Family/patient expects to be discharged to:: Skilled nursing facility  Additional Comments: husband works at night and sleeps during the day    Prior Function Level of Independence: Independent with assistive device(s)               Hand Dominance        Extremity/Trunk Assessment   Upper Extremity Assessment: Generalized weakness           Lower Extremity Assessment: RLE deficits/detail RLE Deficits / Details: Decr tolerance of any and all movement of RLE; extremely limited by pain       Communication   Communication: No difficulties  Cognition Arousal/Alertness: Awake/alert Behavior During Therapy: WFL for tasks assessed/performed;Anxious  (extremely anxious with anticipation of pain) Overall Cognitive Status: Within Functional Limits for tasks assessed       Memory: Decreased recall of precautions              General Comments General comments (skin integrity, edema, etc.): pt with N/V while getting up; RN aware; noted pt also incontinent of urine    Exercises        Assessment/Plan    PT Assessment Patient needs continued PT services  PT Diagnosis Difficulty walking;Acute pain;Generalized weakness   PT Problem List Decreased strength;Decreased range of motion;Decreased activity tolerance;Decreased balance;Decreased mobility;Decreased coordination;Decreased cognition;Decreased knowledge of use of DME;Decreased knowledge of precautions;Pain  PT Treatment Interventions DME instruction;Gait training;Functional mobility training;Therapeutic activities;Therapeutic exercise;Balance training;Cognitive remediation;Patient/family education   PT Goals (Current goals can be found in the Care Plan section) Acute Rehab PT Goals Patient Stated Goal: did not state PT Goal Formulation: With patient Time For Goal Achievement: 09/23/14 Potential to Achieve Goals: Good    Frequency Min 4X/week   Barriers to discharge Decreased caregiver support      Co-evaluation               End of Session Equipment Utilized During Treatment: Gait belt Activity Tolerance: Patient limited by pain (limited by nausea) Patient left: in chair;with call bell/phone within reach Nurse Communication: Mobility status         Time: [id number] PT Time Calculation (min) (ACUTE ONLY): 27 min   Charges:   PT Evaluation $Initial PT Evaluation Tier I: 1 Procedure PT Treatments $Therapeutic Activity: 8-22 mins   PT G Codes:        Olen PelGarrigan, Leilah Polimeni Hamff 09/09/2014, 11:23 AM  Van ClinesHolly Ahonesty Woodfin, PT  Acute Rehabilitation Services Pager [phone] Office [phone]

## 2014-09-09 NOTE — Progress Notes (Signed)
OT Cancellation Note  Patient Details Name: Katherene Pontondrea J Coy MRN: [id number] DOB: [birth year]   Cancelled Treatment:    Reason Eval/Treat Not Completed: Other (comment) Pt has Medicare and current D/C plan is SNF. No apparent immediate acute care OT needs, therefore will defer OT to SNF. If OT eval is needed please call Acute Rehab Dept. at [phone] or text page OT at [phone].    Nena JordanMiller, Adeyemi Hamad M   Carney LivingLeeAnn Marie Jw Covin, OTR/L Occupational Therapist [phone] (pager)  09/09/2014, 11:43 AM

## 2014-09-09 NOTE — Progress Notes (Signed)
Pt did not want to be cooperative with getting on bedpan or [facility]. D/t pain, she wanted to be cathed rather than have to move to urinate. She claims she wears depends b/c she can't tell when she has to urinate. We had to change bed, so we medicated pt, gave bath, and pt was cooperative with getting on bedpan and she did void. Need to schedule voiding every 2 hours. Pt agreed. Needs a lot of positive reinforcement. Our goal Sunday night is to be up and getting on BSC to void.  Pt c/o severe intermittent muscle spasms to anterior and lateral right hip/thigh area. Does muscle relaxer order need to be adjusted?

## 2014-09-09 NOTE — Progress Notes (Addendum)
Subjective: 2 Days Post-Op Procedure(s) (LRB): Excision Total Hip Arthroplasty, Place Antibiotic Spacer (Right) Patient reports pain as 6 on 0-10 scale and 7 on 0-10 scale.    Objective: Vital signs in last 24 hours: Temp:  [98 F (36.7 C)-98.6 F (37 C)] 98.1 F (36.7 C) (03/06 0549) Pulse Rate:  [94-111] 94 (03/06 0549) Resp:  [16-18] 18 (03/06 0549) BP: (96-137)/(50-75) 96/70 mmHg (03/06 0549) SpO2:  [90 %-95 %] 90 % (03/06 0549)  Intake/Output from previous day: 03/05 0701 - 03/06 0700 In: 1150 [P.O.:480; Blood:670] Out: -  Intake/Output this shift: Total I/O In: 60 [P.O.:60] Out: -    Recent Labs  09/07/14 1308 09/07/14 1655 09/08/14 0413 09/09/14 0503  HGB 11.3* 8.3* 6.7* 8.7*    Recent Labs  09/08/14 0413 09/09/14 0503  WBC 10.2 13.4*  RBC 2.71* 3.37*  HCT 21.4* 27.0*  PLT 256 253    Recent Labs  09/08/14 0413 09/09/14 0503  NA 134* 133*  K 5.3* 3.9  CL 102 102  CO2 25 27  BUN 20 25*  CREATININE 1.25* 1.14*  GLUCOSE 237* 225*  CALCIUM 8.3* 8.3*    Recent Labs  09/07/14 1308  INR 1.02    Neurologically intact  Assessment/Plan: 2 Days Post-Op Procedure(s) (LRB): Excision Total Hip Arthroplasty, Place Antibiotic Spacer (Right) Up with therapy made it to a recliner. H and V. Passing flatus. Hx of impaction when on rehab. Will order dulcolax supp NOW  and also fleets if needed.   Avi Kerschner C 09/09/2014, 9:45 AM Hgb 8.7 after transfusion

## 2014-09-09 NOTE — Progress Notes (Signed)
Physical Therapy Treatment Patient Details Name: Kelly Richardson MRN: [id number] DOB: 1951-01-06 Today's Date: 09/09/2014    History of Present Illness 64 y.o. female with a diagnosis of Infected Right Total Hip Arthroplasty; pt s/p Rt hip antibiotic spacer placement 09/07/14.    PT Comments    Pain and anxiety significantly limited activity and standing tolerance; Extreme difficulty moving LLE, even "heel-toe" pivot on L; continue to recommend SNF for post acute rehab  Follow Up Recommendations  SNF     Equipment Recommendations  Rolling walker with 5" wheels;3in1 (PT)    Recommendations for Other Services       Precautions / Restrictions Precautions Precautions: Posterior Hip;Fall Precaution Booklet Issued: Yes (comment) Restrictions RLE Weight Bearing: Touchdown weight bearing    Mobility  Bed Mobility Overal bed mobility: +2 for physical assistance;Needs Assistance Bed Mobility: Sit to Supine       Sit to supine: +2 for physical assistance;Max assist   General bed mobility comments: Step-by step cues for technique; close guard fo rpost precautions; required assist for all movement of RLE; Max assist to lift Bil LEs onto bed; very anxious with anticipation of pain  Transfers Overall transfer level: Needs assistance Equipment used: Rolling walker (2 wheeled) Transfers: Sit to/from Stand Sit to Stand: +2 physical assistance;Mod assist Stand pivot transfers: +2 physical assistance;Max assist       General transfer comment: Cues for safety, hand placement and technqiue; Heavy mod assist with most of support given on R side for lift; once up, pt unable to effectively unweigh RLE to [facility] for stepping, so performed a basic pivot toward pt's stronger L side; palpable crepitus L hip and knee; unable to stand upright; For safety, both therapist and tech had to be supporting pt at gait belt and hips, so did not specifically look to see that pt was TDWB RLE -- given difficulty  of transfer, it is likely she put more weight than touchdown  Ambulation/Gait             General Gait Details: Unable   Stairs            Wheelchair Mobility    Modified Rankin (Stroke Patients Only)       Balance             Standing balance-Leahy Scale: Zero                      Cognition Arousal/Alertness: Awake/alert Behavior During Therapy: [facility] for tasks assessed/performed;Anxious (extremely anxious with anticipation of pain) Overall Cognitive Status: Within Functional Limits for tasks assessed       Memory: Decreased recall of precautions              Exercises      General Comments General comments (skin integrity, edema, etc.): anxiety plays a large role in pt's difficulty with mobility      Pertinent Vitals/Pain Pain Assessment: 0-10 Pain Score: 8  Pain Location: R hip spasm Pain Descriptors / Indicators: Crying;Spasm Pain Intervention(s): Limited activity within patient's tolerance;Monitored during session;Premedicated before session;Repositioned    Home Living                      Prior Function            PT Goals (current goals can now be found in the care plan section) Acute Rehab PT Goals Patient Stated Goal: did not state PT Goal Formulation: With patient Time For Goal Achievement: 09/23/14  Potential to Achieve Goals: Good Progress towards PT goals: Not progressing toward goals - comment (very anxious and painful this session)    Frequency  Min 4X/week    PT Plan Current plan remains appropriate    Co-evaluation             End of Session Equipment Utilized During Treatment: Gait belt Activity Tolerance: Patient limited by pain (limited by nausea) Patient left: in chair;with call bell/phone within reach     Time: 1401-1420 PT Time Calculation (min) (ACUTE ONLY): 19 min  Charges:  $Therapeutic Activity: 8-22 mins                    G Codes:      Olen PelGarrigan, Kharson Rasmusson Hamff 09/09/2014,  3:11 PM  Van ClinesHolly Quin Mcpherson, [place]PT  Acute Rehabilitation Services Pager [phone] Office [phone]

## 2014-09-10 DIAGNOSIS — Y839 Surgical procedure, unspecified as the cause of abnormal reaction of the patient, or of later complication, without mention of misadventure at the time of the procedure: Secondary | ICD-10-CM

## 2014-09-10 DIAGNOSIS — M62838 Other muscle spasm: Secondary | ICD-10-CM

## 2014-09-10 DIAGNOSIS — T8451XA Infection and inflammatory reaction due to internal right hip prosthesis, initial encounter: Principal | ICD-10-CM

## 2014-09-10 DIAGNOSIS — D649 Anemia, unspecified: Secondary | ICD-10-CM

## 2014-09-10 DIAGNOSIS — E1165 Type 2 diabetes mellitus with hyperglycemia: Secondary | ICD-10-CM

## 2014-09-10 LAB — BASIC METABOLIC PANEL
ANION GAP: 4 — AB (ref 5–15)
BUN: 20 mg/dL (ref 6–23)
CHLORIDE: 103 mmol/L (ref 96–112)
CO2: 28 mmol/L (ref 19–32)
Calcium: 8.2 mg/dL — ABNORMAL LOW (ref 8.4–10.5)
Creatinine, Ser: 1.01 mg/dL (ref 0.50–1.10)
GFR calc Af Amer: 68 mL/min — ABNORMAL LOW (ref >90)
GFR calc non Af Amer: 59 mL/min — ABNORMAL LOW (ref >90)
GLUCOSE: 163 mg/dL — AB (ref 70–99)
POTASSIUM: 3.8 mmol/L (ref 3.5–5.1)
Sodium: 135 mmol/L (ref 135–145)

## 2014-09-10 LAB — CBC WITH DIFFERENTIAL/PLATELET
BASOS PCT: 0 % (ref 0–1)
Basophils Absolute: 0 10*3/uL (ref 0.0–0.1)
EOS ABS: 0.4 10*3/uL (ref 0.0–0.7)
Eosinophils Relative: 3 % (ref 0–5)
HCT: 24.7 % — ABNORMAL LOW (ref 36.0–46.0)
Hemoglobin: 7.9 g/dL — ABNORMAL LOW (ref 12.0–15.0)
Lymphocytes Relative: 27 % (ref 12–46)
Lymphs Abs: 3 10*3/uL (ref 0.7–4.0)
MCH: 26.1 pg (ref 26.0–34.0)
MCHC: 32 g/dL (ref 30.0–36.0)
MCV: 81.5 fL (ref 78.0–100.0)
Monocytes Absolute: 1.1 10*3/uL — ABNORMAL HIGH (ref 0.1–1.0)
Monocytes Relative: 10 % (ref 3–12)
NEUTROS ABS: 6.5 10*3/uL (ref 1.7–7.7)
NEUTROS PCT: 60 % (ref 43–77)
Platelets: 227 10*3/uL (ref 150–400)
RBC: 3.03 MIL/uL — ABNORMAL LOW (ref 3.87–5.11)
RDW: 15.8 % — ABNORMAL HIGH (ref 11.5–15.5)
WBC: 11 10*3/uL — ABNORMAL HIGH (ref 4.0–10.5)

## 2014-09-10 LAB — WOUND CULTURE: CULTURE: NO GROWTH

## 2014-09-10 LAB — GLUCOSE, CAPILLARY
GLUCOSE-CAPILLARY: 167 mg/dL — AB (ref 70–99)
Glucose-Capillary: 183 mg/dL — ABNORMAL HIGH (ref 70–99)
Glucose-Capillary: 167 mg/dL — ABNORMAL HIGH (ref 70–99)
Glucose-Capillary: 189 mg/dL — ABNORMAL HIGH (ref 70–99)
Glucose-Capillary: 186 mg/dL — ABNORMAL HIGH (ref 70–99)

## 2014-09-10 MED ORDER — VANCOMYCIN HCL 10 G IV SOLR
1250.0000 mg | Freq: Two times a day (BID) | INTRAVENOUS | Status: DC
  Administered 2014-09-10 – 2014-09-11 (×2): 1250 mg via INTRAVENOUS
  Filled 2014-09-10 (×3): qty 1250

## 2014-09-10 MED ORDER — LEVOFLOXACIN 500 MG PO TABS
750.0000 mg | ORAL_TABLET | Freq: Every day | ORAL | Status: DC
  Administered 2014-09-10: 750 mg via ORAL
  Filled 2014-09-10: qty 2

## 2014-09-10 NOTE — Progress Notes (Addendum)
Patient ID: Kelly Richardson, female   DOB: 1951/04/22, 64 y.o.   MRN: [id number] Plan for discharge to skilled nursing when bed available. Discharge summary completed. Patient resting comfortably this morning.  IV antibiotics per recommendation of infectious disease.

## 2014-09-10 NOTE — Clinical Documentation Improvement (Signed)
Please specify diagnosis related to below supporting information if appropriate.   Possible Clinical Conditions?    Expected Acute Blood Loss Anemia  Acute Blood Loss Anemia  Acute on chronic blood loss anemia  Chronic blood loss anemia  Precipitous drop in Hematocrit  Other Condition________________  Cannot Clinically Determine    Supporting Information:  Patient s/p  =  Excision &Total Hip Arthroplasty, Place Antibiotic Spacer (Right) on 09/07/14.   Received 2 units PRBCs on 09/08/14.    Patient's labs during this admission  Component     Latest Ref Rng 09/07/2014         1:08 PM  Hemoglobin     12.0 - 15.0 g/dL 16.111.3 (L)  HCT     09.636.0 - 46.0 % 36.4   Component     Latest Ref Rng 09/07/2014         4:55 PM  Hemoglobin     12.0 - 15.0 g/dL 8.3 (L)  HCT     04.536.0 - 46.0 % 26.2 (L)   Component     Latest Ref Rng 09/08/2014         4:13 AM  Hemoglobin     12.0 - 15.0 g/dL 6.7 (LL)  HCT     40.936.0 - 46.0 % 21.4 (L)   Component     Latest Ref Rng 09/09/2014         5:03 AM  Hemoglobin     12.0 - 15.0 g/dL 8.7 (L)  HCT     81.136.0 - 46.0 % 27.0 (L)   Component     Latest Ref Rng 09/10/2014          Hemoglobin     12.0 - 15.0 g/dL 7.9 (L)  HCT     91.436.0 - 46.0 % 24.7 (L)      Thank You, Shelda Palarlene H Mayu Ronk ,RN Clinical Documentation Specialist:  [phone]  [facility]- Health Information Management

## 2014-09-10 NOTE — Clinical Social Work Note (Deleted)
deleted

## 2014-09-10 NOTE — Consult Note (Signed)
[facility] for Infectious Disease  Total days of antibiotics 1        Day 1 vanco        Day 1 clindamycin               Reason for Consult: right hip arthroplasty infection/osteomyelitis    Referring Physician: dud Active Problems:   Infected prosthetic hip    HPI: Kelly Richardson is a 64 y.o. female with pmhx of diabetes, HTN, and complicated ortho history. She initially had  Right THA in 2011. Did well for several years until, 2015 where she had open wound/ulceration develop x 6 months from mid 2015 onward. 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 She followed up with Dr. Johnnye Sima in early January where she still had small amt of wound drainage, and sed rate at that time was 73 down from 101 in December. She was converted to oral doxycycline at that time. She has now had ongoing drainage at that site with concern for worsening abscess, leading to osteomyelitis. On 3/4, she underwent total excision of THA s/p antibiotic spacer. OR findings suggests infection coming from femoral shaft.Tissue sent from OR which are showing polymicrobial growth. Still awaiting identification and sensitivities.  Past Medical History  Diagnosis Date  . Diabetes mellitus without complication   . Complication of anesthesia   . PONV (postoperative nausea and vomiting)   . Hypertension   . Heart murmur     as a child  . 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 Arthritis   . Shortness of breath dyspnea     sometimes  at night    Allergies:  Allergies  Allergen Reactions  . Penicillins Anaphylaxis  . Seldane [Terfenadine] Other (See Comments)    N/V, increased heart rate, seizures, hallinations.  . Sulfonamide Derivatives  Anaphylaxis  . Other Nausea And Vomiting    "Anything I have ever been given more nausea just increases it."  . Iohexol      Desc: pt states she [place] and immediately experienced profuse vomiting and sweating.  She said they treated her like she was having a reaction and doesn't remember much else or if meds were given., Onset Date: [id number]   . Latex Other (See Comments)    BLISTERING  . Metformin Other (See Comments)    REACTION: faintness, DIZZINESS ,  . Vicks Formula 44 Cough Relief [Dextromethorphan Hbr] Other (See Comments)    Runs blood pressure up   . Xanax [Alprazolam]     Pt does not want Xanax--history of addiction.   MEDICATIONS: . sodium chloride   Intravenous Once  . aspirin EC  325 mg Oral Q breakfast  . docusate sodium  100 mg Oral BID  . ferrous sulfate  325 mg Oral TID PC  . insulin aspart  0-20 Units Subcutaneous TID WC  . insulin aspart  6 Units Subcutaneous TID WC  . insulin detemir  45 Units Subcutaneous QHS    History  Substance Use Topics  . Smoking status: Never Smoker   . Smokeless tobacco: Not on file  . Alcohol Use: No    History reviewed. No pertinent family history.   Review of Systems  Constitutional: Negative for fever, chills, diaphoresis, activity change, appetite change, fatigue and unexpected weight change.  HENT: Negative for congestion, sore throat, rhinorrhea, sneezing, trouble swallowing and sinus pressure.  Eyes: Negative for photophobia and visual disturbance.  Respiratory: Negative for cough, chest tightness, shortness of breath, wheezing and stridor.  Cardiovascular: Negative for chest pain, palpitations and leg swelling.  Gastrointestinal: Negative for nausea, vomiting, abdominal pain, diarrhea, constipation, blood in stool, abdominal distention and anal bleeding.  Genitourinary: Negative for dysuria, hematuria, flank pain and difficulty urinating.  Musculoskeletal: positive for muscle spasms Skin: Negative  for color change, pallor, rash and wound.  Neurological: Negative for dizziness, tremors, weakness and light-headedness.  Hematological: Negative for adenopathy. Does not bruise/bleed easily.  Psychiatric/Behavioral: Negative for behavioral problems, confusion, sleep disturbance, dysphoric mood, decreased concentration and agitation.     OBJECTIVE: Temp:  [97.9 F (36.6 C)-98.5 F (36.9 C)] 98.3 F (36.8 C) (03/07 0429) Pulse Rate:  [83-102] 83 (03/07 0429) Resp:  [18] 18 (03/07 0429) BP: (124-130)/(70-72) 124/72 mmHg (03/07 0429) SpO2:  [95 %-100 %] 100 % (03/07 0429) Physical Exam  Constitutional:  oriented to person, place, and time. appears well-developed and well-nourished. No distress.  HENT:  Mouth/Throat: Oropharynx is clear and moist. No oropharyngeal exudate.  Cardiovascular: Normal rate, regular rhythm and normal heart sounds. Exam reveals no gallop and no friction rub.  No murmur heard.  Pulmonary/Chest: Effort normal and breath sounds normal. No respiratory distress.  has no wheezes.  Abdominal: Soft. Bowel sounds are normal.  exhibits no distension. There is no tenderness.  Lymphadenopathy: no cervical adenopathy.  Neurological: alert and oriented to person, place, and time.  Skin: Skin is warm and dry. No rash noted. No erythema.  Ext: right hip bandaged, left arm picc line Psychiatric: a normal mood and affect. behavior is normal.    LABS: Results for orders placed or performed during the hospital encounter of 09/07/14 (from the past 48 hour(s))  Glucose, capillary     Status: Abnormal   Collection Time: 09/08/14 11:51 AM  Result Value Ref Range   Glucose-Capillary 175 (H) 70 - 99 mg/dL  Glucose, capillary     Status: Abnormal   Collection Time: 09/08/14  4:53 PM  Result Value Ref Range   Glucose-Capillary 154 (H) 70 - 99 mg/dL  Glucose, capillary     Status: Abnormal   Collection Time: 09/08/14 10:10 PM  Result Value Ref Range   Glucose-Capillary 178 (H)  70 - 99 mg/dL  Basic metabolic panel     Status: Abnormal   Collection Time: 09/09/14  5:03 AM  Result Value Ref Range   Sodium 133 (L) 135 - 145 mmol/L   Potassium 3.9 3.5 - 5.1 mmol/L    Comment: DELTA CHECK NOTED   Chloride 102 96 - 112 mmol/L   CO2 27 19 - 32 mmol/L   Glucose, Bld 225 (H) 70 - 99 mg/dL   BUN 25 (H) 6 - 23 mg/dL   Creatinine, Ser 1.14 (H) 0.50 - 1.10 mg/dL   Calcium 8.3 (L) 8.4 - 10.5 mg/dL   GFR calc non Af Amer 51 (L) >90 mL/min   GFR calc Af Amer 59 (L) >90 mL/min    Comment: (NOTE) The eGFR has been calculated using the CKD EPI equation. This calculation has not been validated in all clinical situations. eGFR's persistently <90 mL/min signify possible Chronic Kidney Disease.    Anion gap 4 (L) 5 - 15  CBC with Differential/Platelet     Status:  Abnormal   Collection Time: 09/09/14  5:03 AM  Result Value Ref Range   WBC 13.4 (H) 4.0 - 10.5 K/uL   RBC 3.37 (L) 3.87 - 5.11 MIL/uL   Hemoglobin 8.7 (L) 12.0 - 15.0 g/dL    Comment: POST TRANSFUSION SPECIMEN   HCT 27.0 (L) 36.0 - 46.0 %   MCV 80.1 78.0 - 100.0 fL   MCH 25.8 (L) 26.0 - 34.0 pg   MCHC 32.2 30.0 - 36.0 g/dL   RDW 15.3 11.5 - 15.5 %   Platelets 253 150 - 400 K/uL   Neutrophils Relative % 67 43 - 77 %   Neutro Abs 8.9 (H) 1.7 - 7.7 K/uL   Lymphocytes Relative 21 12 - 46 %   Lymphs Abs 2.7 0.7 - 4.0 K/uL   Monocytes Relative 11 3 - 12 %   Monocytes Absolute 1.5 (H) 0.1 - 1.0 K/uL   Eosinophils Relative 1 0 - 5 %   Eosinophils Absolute 0.2 0.0 - 0.7 K/uL   Basophils Relative 0 0 - 1 %   Basophils Absolute 0.0 0.0 - 0.1 K/uL  Glucose, capillary     Status: Abnormal   Collection Time: 09/09/14  6:15 AM  Result Value Ref Range   Glucose-Capillary 211 (H) 70 - 99 mg/dL  Glucose, capillary     Status: Abnormal   Collection Time: 09/09/14 11:57 AM  Result Value Ref Range   Glucose-Capillary 156 (H) 70 - 99 mg/dL  Glucose, capillary     Status: Abnormal   Collection Time: 09/09/14  4:42 PM    Result Value Ref Range   Glucose-Capillary 183 (H) 70 - 99 mg/dL  Glucose, capillary     Status: Abnormal   Collection Time: 09/09/14  9:24 PM  Result Value Ref Range   Glucose-Capillary 181 (H) 70 - 99 mg/dL  Basic metabolic panel     Status: Abnormal   Collection Time: 09/10/14  4:00 AM  Result Value Ref Range   Sodium 135 135 - 145 mmol/L   Potassium 3.8 3.5 - 5.1 mmol/L   Chloride 103 96 - 112 mmol/L   CO2 28 19 - 32 mmol/L   Glucose, Bld 163 (H) 70 - 99 mg/dL   BUN 20 6 - 23 mg/dL   Creatinine, Ser 1.01 0.50 - 1.10 mg/dL   Calcium 8.2 (L) 8.4 - 10.5 mg/dL   GFR calc non Af Amer 59 (L) >90 mL/min   GFR calc Af Amer 68 (L) >90 mL/min    Comment: (NOTE) The eGFR has been calculated using the CKD EPI equation. This calculation has not been validated in all clinical situations. eGFR's persistently <90 mL/min signify possible Chronic Kidney Disease.    Anion gap 4 (L) 5 - 15  CBC with Differential/Platelet     Status: Abnormal   Collection Time: 09/10/14  4:00 AM  Result Value Ref Range   WBC 11.0 (H) 4.0 - 10.5 K/uL   RBC 3.03 (L) 3.87 - 5.11 MIL/uL   Hemoglobin 7.9 (L) 12.0 - 15.0 g/dL   HCT 24.7 (L) 36.0 - 46.0 %   MCV 81.5 78.0 - 100.0 fL   MCH 26.1 26.0 - 34.0 pg   MCHC 32.0 30.0 - 36.0 g/dL   RDW 15.8 (H) 11.5 - 15.5 %   Platelets 227 150 - 400 K/uL   Neutrophils Relative % 60 43 - 77 %   Neutro Abs 6.5 1.7 - 7.7 K/uL   Lymphocytes Relative 27 12 - 46 %  Lymphs Abs 3.0 0.7 - 4.0 K/uL   Monocytes Relative 10 3 - 12 %   Monocytes Absolute 1.1 (H) 0.1 - 1.0 K/uL   Eosinophils Relative 3 0 - 5 %   Eosinophils Absolute 0.4 0.0 - 0.7 K/uL   Basophils Relative 0 0 - 1 %   Basophils Absolute 0.0 0.0 - 0.1 K/uL  Glucose, capillary     Status: Abnormal   Collection Time: 09/10/14  6:39 AM  Result Value Ref Range   Glucose-Capillary 167 (H) 70 - 99 mg/dL     MICRO: 3/4 tissue cx: polymicrobial growth. 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   - PJI:  recommend empiric regimen of vancomycin plus levofloxacin, since she has anaphylaxis to penicillins. We will review if she can tolerate cephalosporins - recommend to treat for 2nd course of Iv therapy with 6 wks  - await micro results to determine final antibiotic regimen, please have her stay overnight until we have culture results in the am. - diabetes = recommend better management, BS <200 to help with healing. Likely elevated in setting of infection - anemia = has received 2 u rbc since operation - muscle spasm = consider robaxin to help with muscle spasm. - will have her follow up with Dr. Johnnye Sima in [place] upon discharge  [place] [phone]

## 2014-09-10 NOTE — Progress Notes (Signed)
ANTIBIOTIC CONSULT NOTE - INITIAL  Pharmacy Consult for Vancomycin Indication: osteomyelitis  Allergies  Allergen Reactions  . Penicillins Anaphylaxis  . Seldane [Terfenadine] Other (See Comments)    N/V, increased heart rate, seizures, hallinations.  . Sulfonamide Derivatives Anaphylaxis  . Other Nausea And Vomiting    "Anything I have ever been given more nausea just increases it."  . Iohexol      Desc: pt states she had a ct in [place] in 2000 and immediately experienced profuse vomiting and sweating.  She said they treated her like she was having a reaction and doesn't remember much else or if meds were given., Onset Date: [id number]   . Latex Other (See Comments)    BLISTERING  . Metformin Other (See Comments)    REACTION: faintness, DIZZINESS ,  . Vicks Formula 44 Cough Relief [Dextromethorphan Hbr] Other (See Comments)    Runs blood pressure up   . Xanax [Alprazolam]     Pt does not want Xanax--history of addiction.    Patient Measurements: Height:  (154.9 cm) Weight: 218 lb (98.884 kg) IBW/kg (Calculated) : 47.8  Vital Signs: Temp: 97.9 F (36.6 C) (03/07 1300) Temp Source: Oral (03/07 0429) BP: 153/69 mmHg (03/07 1300) Pulse Rate: 93 (03/07 1300)  Labs:  Recent Labs  09/08/14 0413 09/09/14 0503 09/10/14 0400  WBC 10.2 13.4* 11.0*  HGB 6.7* 8.7* 7.9*  PLT 256 253 227  CREATININE 1.25* 1.14* 1.01   Estimated Creatinine Clearance: 63 mL/min (by C-G formula based on Cr of 1.01).  Microbiology: Recent Results (from the past 720 hour(s))  Wound culture     Status: None   Collection Time: 09/07/14  2:21 PM  Result Value Ref Range Status   Specimen Description WOUND RIGHT HIP  Final   Special Requests Part B  Final   Gram Stain   Final    FEW WBC PRESENT,BOTH PMN AND MONONUCLEAR NO SQUAMOUS EPITHELIAL CELLS SEEN NO ORGANISMS SEEN Performed at [facility]    Culture   Final    NO GROWTH 2 DAYS Performed at [facility]    Report  Status 09/10/2014 FINAL  Final  Anaerobic culture     Status: None (Preliminary result)   Collection Time: 09/07/14  2:21 PM  Result Value Ref Range Status   Specimen Description WOUND RIGHT HIP  Final   Special Requests Part B  Final   Gram Stain PENDING  Incomplete   Culture   Final    NO ANAEROBES ISOLATED; CULTURE IN PROGRESS FOR 5 DAYS Performed at [facility]    Report Status PENDING  Incomplete  Tissue culture     Status: None (Preliminary result)   Collection Time: 09/07/14  2:26 PM  Result Value Ref Range Status   Specimen Description TISSUE RIGHT HIP  Final   Special Requests NONE  Final   Gram Stain   Final    MODERATE WBC PRESENT,BOTH PMN AND MONONUCLEAR NO ORGANISMS SEEN Performed at [facility]    Culture   Final    MULTIPLE ORGANISMS PRESENT, NONE PREDOMINANT Performed at [facility]    Report Status PENDING  Incomplete  Wound culture     Status: None   Collection Time: 09/07/14  2:54 PM  Result Value Ref Range Status   Specimen Description WOUND RIGHT HIP  Final   Special Requests NONE  Final   Gram Stain   Final    FEW WBC PRESENT,BOTH PMN AND MONONUCLEAR NO SQUAMOUS  EPITHELIAL CELLS SEEN NO ORGANISMS SEEN [facility]formed [facility]    Culture   Final    NO GROWTH 2 DAYS Performed at [facility]    Report Status 09/09/2014 FINAL  Final  Anaerobic culture     Status: None (Preliminary result)   Collection Time: 09/07/14  2:54 PM  Result Value Ref Range Status   Specimen Description WOUND RIGHT HIP  Final   Special Requests PART C  Final   Gram Stain   Final    FEW WBC PRESENT,BOTH PMN AND MONONUCLEAR NO SQUAMOUS EPITHELIAL CELLS SEEN NO ORGANISMS SEEN [facility]formed at [facility]    Culture   Final    NO ANAEROBES ISOLATED; CULTURE IN PROGRESS FOR 5 DAYS Performed at [facility]    Report Status PENDING  Incomplete    Medical History: Past Medical History  Diagnosis Date  .  Diabetes mellitus without complication   . Complication of anesthesia   . PONV (postoperative nausea and vomiting)   . Hypertension   . Heart murmur     as a child  . 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 Arthritis   . Shortness of breath dyspnea     sometimes  at night   Assessment:  POD# 3 excision of past THA and placement of antibiotic spacer.  Hx R THA in 2011, infection in 06/2014 and had 6 weeks of IV Vancomycin + Levofloxacin Dec 2015-Jan 2016. Hx anaphylaxis to Penicillins. Received Clindamycin peri-operatively.  One of 3 intraoperative deep cultures growing multiple organisms. Vanc and Levaquin x 6 weeks begun today.  Spoke briefly with Dr. Drue SecondSnider. Oral Levaquin ok.  Noted plan for discharge to SNF after cultures are final.  Goal of Therapy:  Vancomycin trough level 15-20 mcg/ml  Plan:   Vancomycin 1250 mg IV q12hrs.  Will need steady-state Vancomycin trough level, then weekly while on Vancomycin.  Weekly bmet and CBC.   Levaquin 750 mg PO daily.  Currently on iron TID after meals. Doses should be separted from Levaquin by at least 2 hours to prevent potential binding and decreased concentration and possible decreased efficacy of Levaquin.  Follow renal function.  Follow up final cultures.  Dennie FettersEgan, Wray [place], [place] Pager: [phone] 09/10/2014,3:18 PM

## 2014-09-10 NOTE — Progress Notes (Signed)
Physical Therapy Treatment Patient Details Name: Kelly Richardson MRN: [id number] DOB: [birth year] Today's Date: 09/10/2014    History of Present Illness [age] y.o. female with a diagnosis of Infected Right Total Hip Arthroplasty; pt s/p Rt hip antibiotic spacer placement 09/07/14.Marland Kitchen.     PT Comments    Continuing difficulty with mobility, affected by anxiety with the anticipation of pain, and also with noted palpable crepitus in non-operated L hip and knee with weight bearing; Given difficulty with standing on LLE, will re-focus our PT mobility training sessions on scoot pivot transfers, possibly with sliding board; Continue to recommend SNF for post acute rehab   Follow Up Recommendations  SNF     Equipment Recommendations  Rolling walker with 5" wheels;3in1 (PT);Other (comment) (will consider drop-arm BSC and sliding board)    Recommendations for Other Services       Precautions / Restrictions Precautions Precautions: Posterior Hip;Fall Precaution Booklet Issued: Yes (comment) Precaution Comments: reinforced hip prec; pt recalled 3/3 Restrictions RLE Weight Bearing: Touchdown weight bearing    Mobility  Bed Mobility Overal bed mobility: +2 for physical assistance;Needs Assistance Bed Mobility: Rolling;Supine to Sit Rolling: +2 for physical assistance;Mod assist   Supine to sit: +2 for physical assistance;Mod assist     General bed mobility comments: initially assisted pt with rolling for placement of bedpan at her request; Cues for post hip prec, and used pillow between LEs to avoid her adducting to across midline; moves very slowly with anticipation of pain; Step-by step cues for technique; close guard fo rpost precautions; required assist for all movement of RLE towards EOB; used bed pad to square off hips at EOB; very anxious with anticipation of pain  Transfers Overall transfer level: Needs assistance Equipment used: 2 person hand held assist Transfers: Squat Pivot  Transfers     Squat pivot transfers: +2 physical assistance;Max assist     General transfer comment: performed squat pivot transfer towards non-operative L side with L knee blocke for stability; essentially a dependent transfer; second person present for safety and to monitor for post hip prec and TWB  Ambulation/Gait                 Stairs            Wheelchair Mobility    Modified Rankin (Stroke Patients Only)       Balance                                    Cognition Arousal/Alertness: Awake/alert Behavior During Therapy: WFL for tasks assessed/performed (though very internally distracted) Overall Cognitive Status:  (internally distracted with anticipation of pain)                      Exercises      General Comments        Pertinent Vitals/Pain Pain Assessment: Faces Faces Pain Scale: Hurts whole lot Pain Location: R hip spasm Pain Descriptors / Indicators: Aching;Grimacing;Crying;Discomfort Pain Intervention(s): Limited activity within patient's tolerance;Monitored during session;Premedicated before session;Patient requesting pain meds-RN notified;Repositioned    Home Living                      Prior Function            PT Goals (current goals can now be found in the care plan section) Acute Rehab PT Goals Patient Stated Goal: Wants pain to decrease  PT Goal Formulation: With patient Time For Goal Achievement: 09/23/14 Potential to Achieve Goals: Good Progress towards PT goals: Progressing toward goals    Frequency  Min 4X/week    PT Plan Current plan remains appropriate    Co-evaluation             End of Session Equipment Utilized During Treatment: Gait belt Activity Tolerance: Patient limited by pain Patient left: in chair;with call bell/phone within reach     Time: 1056-1115 (Plus time 1010-1020 assisting her onto bedpan) PT Time Calculation (min) (ACUTE ONLY): 19 min  Charges:   $Therapeutic Activity: 23-37 mins                    G Codes:      Olen Pel 09/10/2014, 12:30 PM  [facility] [place], [place]  Acute Rehabilitation Services Pager [phone] Office [phone]

## 2014-09-10 NOTE — Evaluation (Signed)
Occupational Therapy Evaluation Patient Details Name: Kelly Richardson MRN: [id number] DOB: [birth year] Today's Date: 09/10/2014    History of Present Illness 64 y.o. female with a diagnosis of Infected Right Total Hip Arthroplasty; pt s/p Rt hip antibiotic spacer placement 09/07/14..    Clinical Impression   Pt admitted with above. She demonstrates the below listed deficits and will benefit from continued OT to maximize safety and independence with BADLs.  Pt currently is limited by pain and generalized weakness.   She was unable to tolerate Rt LE being lowered toward floor in sitting position due to increased pain. Currently, she requires min A for UB ADLs and total A for LB ADLs. She was instructed in initial HEP.  Will follow.       Follow Up Recommendations  SNF;Supervision/Assistance - 24 hour    Equipment Recommendations  Other (comment) (tbd by next venue )    Recommendations for Other Services       Precautions / Restrictions Precautions Precautions: Posterior Hip;Fall Precaution Booklet Issued: Yes (comment) Precaution Comments: Pt able to state post hip precautions  Restrictions Weight Bearing Restrictions: Yes RLE Weight Bearing: Touchdown weight bearing      Mobility Bed Mobility Overal bed mobility: +2 for physical assistance;Needs Assistance Bed Mobility: Rolling;Supine to Sit Rolling: +2 for physical assistance;Mod assist   Supine to sit: +2 for physical assistance;Mod assist     General bed mobility comments: initially assisted pt with rolling for placement of bedpan at her request; Cues for post hip prec, and used pillow between LEs to avoid her adducting to across midline; moves very slowly with anticipation of pain; Step-by step cues for technique; close guard fo rpost precautions; required assist for all movement of RLE towards EOB; used bed pad to square off hips at EOB; very anxious with anticipation of pain  Transfers Overall transfer level: Needs  assistance Equipment used: 2 person hand held assist Transfers: Squat Pivot Transfers     Squat pivot transfers: +2 physical assistance;Max assist     General transfer comment: Pt unable to attempt with OT due to pain     Balance                                            ADL Overall ADL's : Needs assistance/impaired Eating/Feeding: Independent   Grooming: Wash/dry hands;Wash/dry face;Oral care;Independent;Set up;Sitting   Upper Body Bathing: Minimal assitance;Sitting   Lower Body Bathing: Total assistance;Bed level   Upper Body Dressing : Minimal assistance;Sitting   Lower Body Dressing: Total assistance;Bed level   Toilet Transfer: Total assistance Toilet Transfer Details (indicate cue type and reason): Pt unable to attempt due to increased pain  Toileting- Clothing Manipulation and Hygiene: Total assistance;Bed level   Tub/ Shower Transfer: Total assistance Tub/Shower Transfer Details (indicate cue type and reason): unable  Functional mobility during ADLs: +2 for physical assistance;Maximal assistance General ADL Comments: Pt unable to tolerate Rt foot on floor in seated postion.  Discussed AE with pt and safety with BADLs, but she was unable to practice due to increased pain      Vision     Perception     Praxis      Pertinent Vitals/Pain Pain Assessment: Faces Faces Pain Scale: Hurts whole lot Pain Location: Rt hip  Pain Descriptors / Indicators: Aching;Spasm Pain Intervention(s): Limited activity within patient's tolerance;Monitored during session;Ice applied  Hand Dominance Right   Extremity/Trunk Assessment Upper Extremity Assessment Upper Extremity Assessment: Generalized weakness   Lower Extremity Assessment Lower Extremity Assessment: Defer to PT evaluation       Communication Communication Communication: No difficulties   Cognition Arousal/Alertness: Awake/alert Behavior During Therapy: Anxious Overall Cognitive  Status: Within Functional Limits for tasks assessed                     General Comments       Exercises Exercises: Other exercises Other Exercises Other Exercises: bil. shoulder abduction with orange theraband 2 sets 10 reps  Other Exercises: diagnols with orange theraband 2 sets 10 reps Other Exercises: bicep curls with orange theraband 2 sets 10 reps  Other Exercises: triceps with orange theraband 2 sets 10 reps   Shoulder Instructions      Home Living Family/patient expects to be discharged to:: Skilled nursing facility                                        Prior Functioning/Environment Level of Independence: Independent with assistive device(s)             OT Diagnosis: Generalized weakness;Acute pain   OT Problem List: Decreased strength;Decreased activity tolerance;Impaired balance (sitting and/or standing);Decreased safety awareness;Decreased knowledge of use of DME or AE;Decreased knowledge of precautions;Pain;Obesity   OT Treatment/Interventions: Self-care/ADL training;Therapeutic exercise;DME and/or AE instruction;Therapeutic activities;Patient/family education;Balance training    OT Goals(Current goals can be found in the care plan section) Acute Rehab OT Goals Patient Stated Goal: to regain independence  OT Goal Formulation: With patient Time For Goal Achievement: 09/17/14 Potential to Achieve Goals: Good ADL Goals Pt Will Perform Lower Body Bathing: sitting/lateral leans;with adaptive equipment;with mod assist Pt Will Perform Lower Body Dressing: with mod assist;with adaptive equipment;sitting/lateral leans Pt Will Transfer to Toilet: with mod assist;squat pivot transfer;bedside commode Pt/caregiver will Perform Home Exercise Program: Increased strength;Right Upper extremity;Left upper extremity;With theraputty;Independently;With written HEP provided  OT Frequency: Min 2X/week   Barriers to D/C: Decreased caregiver support           Co-evaluation              End of Session Nurse Communication: Mobility status  Activity Tolerance: Patient limited by pain Patient left: in chair;with call bell/phone within reach   Time: [id number] OT Time Calculation (min): 34 min Charges:  OT General Charges $OT Visit: 1 Procedure OT Evaluation $Initial OT Evaluation Tier I: 1 Procedure OT Treatments $Therapeutic Exercise: 8-22 mins G-Codes:    Gregor Dershem M 09/10/2014, 1:34 PM

## 2014-09-10 NOTE — Progress Notes (Signed)
Physical Therapy Treatment Patient Details Name: Kelly Richardson MRN: [id number] DOB: [birth year] Today's Date: 09/10/2014    History of Present Illness 64 y.o. female with a diagnosis of Infected Right Total Hip Arthroplasty; pt s/p Rt hip antibiotic spacer placement 09/07/14.Marland Kitchen      PT Comments    Continuing difficulty with transfers and activity tolerance; Required +2 assist for safety with squat pivot back to bed with L knee heavily blocked; Still, she showed better ability to perform chair push up to move hips in prep for transfer; Will consider working on sliding board transfers next session.  Follow Up Recommendations  SNF     Equipment Recommendations  Rolling walker with 5" wheels;3in1 (PT) Drop-arm BSC, Wheelchair with cushion and elevating legrests; sliding board   Recommendations for Other Services       Precautions / Restrictions Precautions Precautions: Posterior Hip;Fall Precaution Booklet Issued: Yes (comment) Precaution Comments: Pt able to state post hip precautions  Restrictions Weight Bearing Restrictions: Yes RLE Weight Bearing: Touchdown weight bearing    Mobility  Bed Mobility Overal bed mobility: +2 for physical assistance;Needs Assistance Bed Mobility: Sit to Supine       Sit to supine: +2 for physical assistance;Max assist   General bed mobility comments: Step-by step cues for technique; close guard fo rpost precautions; required assist for all movement of RLE; Max assist to lift Bil LEs onto bed; very anxious with anticipation of pain  Transfers Overall transfer level: Needs assistance Equipment used: 2 person hand held assist Transfers: Squat Pivot Transfers     Squat pivot transfers: +2 physical assistance;Max assist     General transfer comment: performed squat pivot transfer towards non-operative L side with L knee blocke for stability; essentially a dependent transfer; second person present for safety and to monitor for post hip prec and  TWB  Ambulation/Gait             General Gait Details: Unable   Stairs            Wheelchair Mobility    Modified Rankin (Stroke Patients Only)       Balance             Standing balance-Leahy Scale: Zero                      Cognition Arousal/Alertness: Awake/alert Behavior During Therapy: [facility] for tasks assessed/performed;Anxious (extremely anxious with anticipation of pain) Overall Cognitive Status: Within Functional Limits for tasks assessed                      Exercises     General Comments        Pertinent Vitals/Pain Pain Assessment: Faces Faces Pain Scale: Hurts whole lot Pain Location: R hip; pt with N/V as well Pain Descriptors / Indicators: Aching;Grimacing Pain Intervention(s): Limited activity within patient's tolerance;Monitored during session;Premedicated before session;Repositioned    Home Living Family/patient expects to be discharged to:: Skilled nursing facility                    Prior Function Level of Independence: Independent with assistive device(s)          PT Goals (current goals can now be found in the care plan section) Acute Rehab PT Goals Patient Stated Goal: did not state PT Goal Formulation: With patient Time For Goal Achievement: 09/23/14 Potential to Achieve Goals: Good Progress towards PT goals: Progressing toward goals    Frequency  Min  4X/week    PT Plan Current plan remains appropriate    Co-evaluation             End of Session Equipment Utilized During Treatment: Gait belt Activity Tolerance: Patient limited by pain (limited by nausea) Patient left: with call bell/phone within reach;in bed;with nursing/sitter in room     Time: 1540-1602 PT Time Calculation (min) (ACUTE ONLY): 22 min  Charges:  $Therapeutic Activity: 8-22 mins                    G Codes:      Kelly PelGarrigan, Aryiana Klinkner Richardson 09/10/2014, 4:11 PM  Kelly Richardson, [place]  Acute Rehabilitation  Services Pager [phone] Office [phone]

## 2014-09-11 ENCOUNTER — Encounter (HOSPITAL_COMMUNITY): Payer: Self-pay | Admitting: Orthopedic Surgery

## 2014-09-11 DIAGNOSIS — T8459XS Infection and inflammatory reaction due to other internal joint prosthesis, sequela: Secondary | ICD-10-CM | POA: Diagnosis not present

## 2014-09-11 DIAGNOSIS — I10 Essential (primary) hypertension: Secondary | ICD-10-CM | POA: Diagnosis not present

## 2014-09-11 DIAGNOSIS — Y831 Surgical operation with implant of artificial internal device as the cause of abnormal reaction of the patient, or of later complication, without mention of misadventure at the time of the procedure: Secondary | ICD-10-CM | POA: Diagnosis present

## 2014-09-11 DIAGNOSIS — B999 Unspecified infectious disease: Secondary | ICD-10-CM | POA: Diagnosis not present

## 2014-09-11 DIAGNOSIS — R0602 Shortness of breath: Secondary | ICD-10-CM | POA: Diagnosis not present

## 2014-09-11 DIAGNOSIS — Z794 Long term (current) use of insulin: Secondary | ICD-10-CM | POA: Diagnosis not present

## 2014-09-11 DIAGNOSIS — Z7982 Long term (current) use of aspirin: Secondary | ICD-10-CM | POA: Diagnosis not present

## 2014-09-11 DIAGNOSIS — Z452 Encounter for adjustment and management of vascular access device: Secondary | ICD-10-CM | POA: Diagnosis not present

## 2014-09-11 DIAGNOSIS — T8451XD Infection and inflammatory reaction due to internal right hip prosthesis, subsequent encounter: Secondary | ICD-10-CM | POA: Diagnosis not present

## 2014-09-11 DIAGNOSIS — M1611 Unilateral primary osteoarthritis, right hip: Secondary | ICD-10-CM | POA: Diagnosis not present

## 2014-09-11 DIAGNOSIS — Z96641 Presence of right artificial hip joint: Secondary | ICD-10-CM | POA: Diagnosis not present

## 2014-09-11 DIAGNOSIS — D62 Acute posthemorrhagic anemia: Secondary | ICD-10-CM | POA: Diagnosis not present

## 2014-09-11 DIAGNOSIS — Z4732 Aftercare following explantation of hip joint prosthesis: Secondary | ICD-10-CM | POA: Diagnosis not present

## 2014-09-11 DIAGNOSIS — M25551 Pain in right hip: Secondary | ICD-10-CM | POA: Diagnosis not present

## 2014-09-11 DIAGNOSIS — M869 Osteomyelitis, unspecified: Secondary | ICD-10-CM | POA: Diagnosis not present

## 2014-09-11 DIAGNOSIS — M6281 Muscle weakness (generalized): Secondary | ICD-10-CM | POA: Diagnosis not present

## 2014-09-11 DIAGNOSIS — E139 Other specified diabetes mellitus without complications: Secondary | ICD-10-CM | POA: Diagnosis not present

## 2014-09-11 DIAGNOSIS — T8451XS Infection and inflammatory reaction due to internal right hip prosthesis, sequela: Secondary | ICD-10-CM | POA: Diagnosis not present

## 2014-09-11 DIAGNOSIS — F411 Generalized anxiety disorder: Secondary | ICD-10-CM | POA: Diagnosis not present

## 2014-09-11 DIAGNOSIS — T8451XA Infection and inflammatory reaction due to internal right hip prosthesis, initial encounter: Secondary | ICD-10-CM | POA: Diagnosis not present

## 2014-09-11 DIAGNOSIS — Z96649 Presence of unspecified artificial hip joint: Secondary | ICD-10-CM | POA: Diagnosis not present

## 2014-09-11 DIAGNOSIS — Z471 Aftercare following joint replacement surgery: Secondary | ICD-10-CM | POA: Diagnosis not present

## 2014-09-11 DIAGNOSIS — R079 Chest pain, unspecified: Secondary | ICD-10-CM | POA: Diagnosis not present

## 2014-09-11 DIAGNOSIS — E785 Hyperlipidemia, unspecified: Secondary | ICD-10-CM | POA: Diagnosis not present

## 2014-09-11 DIAGNOSIS — R339 Retention of urine, unspecified: Secondary | ICD-10-CM | POA: Diagnosis not present

## 2014-09-11 DIAGNOSIS — E119 Type 2 diabetes mellitus without complications: Secondary | ICD-10-CM | POA: Diagnosis not present

## 2014-09-11 DIAGNOSIS — Z9889 Other specified postprocedural states: Secondary | ICD-10-CM | POA: Diagnosis not present

## 2014-09-11 DIAGNOSIS — M25559 Pain in unspecified hip: Secondary | ICD-10-CM | POA: Diagnosis not present

## 2014-09-11 DIAGNOSIS — Z792 Long term (current) use of antibiotics: Secondary | ICD-10-CM | POA: Diagnosis not present

## 2014-09-11 DIAGNOSIS — K5900 Constipation, unspecified: Secondary | ICD-10-CM | POA: Diagnosis not present

## 2014-09-11 DIAGNOSIS — D500 Iron deficiency anemia secondary to blood loss (chronic): Secondary | ICD-10-CM | POA: Diagnosis not present

## 2014-09-11 LAB — GLUCOSE, CAPILLARY
GLUCOSE-CAPILLARY: 95 mg/dL (ref 70–99)
Glucose-Capillary: 125 mg/dL — ABNORMAL HIGH (ref 70–99)

## 2014-09-11 LAB — TISSUE CULTURE

## 2014-09-11 MED ORDER — OXYCODONE-ACETAMINOPHEN 5-325 MG PO TABS: 1.0000 | ORAL_TABLET | ORAL | Status: DC | PRN

## 2014-09-11 MED ORDER — VANCOMYCIN HCL 10 G IV SOLR: 1250.0000 mg | Freq: Two times a day (BID) | INTRAVENOUS | Status: DC

## 2014-09-11 MED ORDER — LEVOFLOXACIN 750 MG PO TABS: 750.0000 mg | ORAL_TABLET | Freq: Every day | ORAL | Status: DC

## 2014-09-11 MED ORDER — HEPARIN SOD (PORK) LOCK FLUSH 100 UNIT/ML IV SOLN
250.0000 [IU] | INTRAVENOUS | Status: AC | PRN
  Administered 2014-09-11: 250 [IU]

## 2014-09-11 NOTE — Discharge Planning (Signed)
Patient will discharge today per MD order. Patient will discharge to [facility] RN to call report prior to transportation to [phone] Transportation: [facility]- arranged for 1:30pm  CSW sent discharge summary to SNF for review.  Packet is complete.  RN, patient aware of discharge plans.  Vickii PennaGina Beth Spackman, LCSWA [phone]  Psychiatric & Orthopedics (5N 1-16) Clinical Social Worker

## 2014-09-11 NOTE — Clinical Social Work Note (Signed)
Patient requests STR at [facility] or [facility].  Patient has been to [facility] in the past and has a balance due there that would need to be paid in full prior to admission.  Patient is aware and not agreeable to these terms.  Patient refused bed at [facility] due to being semi-private.  Patient chose [facility] at time of discharge who is able to provide a private room.  CSW contacted [facility] to verify/confirm bed availability.  CSW awaiting a return call.  Disposition: discharge today to [facility]  Vickii PennaGina Gorje Iyer, LCSWA [phone]  Psychiatric & Orthopedics (5N 1-16) Clinical Social Worker

## 2014-09-11 NOTE — Discharge Summary (Signed)
Physician Discharge Summary  Patient ID: Kelly Richardson MRN: [id number] DOB/AGE: [birth year] 62 y.o.  Admit date: 09/07/2014 Discharge date: 09/11/2014  Admission Diagnoses: Infected right total hip arthroplasty  Discharge Diagnoses:  Active Problems:   Infected prosthetic hip   Discharged Condition: stable  Hospital Course: Patient's hospital course was essentially unremarkable. Patient did have acute blood loss anemia and required transfusions with packed red blood cells 2. Patient was seen with infectious disease for consultation and patient was discharged on vancomycin and Levaquin.  Consults: ID  Significant Diagnostic Studies: labs: Routine labs  Treatments: surgery: See operative note  Discharge Exam: Blood pressure 106/56, pulse 94, temperature 99.2 F (37.3 C), temperature source Oral, resp. rate 16, height 5\' 1"  (1.549 m), weight 98.884 kg (218 lb), SpO2 98 %. Incision/Wound: dressing clean dry and intact  Disposition: 01-Home or Self Care  Discharge Instructions    Call MD / Call 911    Complete by:  As directed   If you experience chest pain or shortness of breath, CALL 911 and be transported to the hospital emergency room.  If you develope a fever above 101 F, pus (white drainage) or increased drainage or redness at the wound, or calf pain, call your surgeon's office.     Constipation Prevention    Complete by:  As directed   Drink plenty of fluids.  Prune juice may be helpful.  You may use a stool softener, such as Colace (over the counter) 100 mg twice a day.  Use MiraLax (over the counter) for constipation as needed.     Diet - low sodium heart healthy    Complete by:  As directed      Increase activity slowly as tolerated    Complete by:  As directed             Medication List    STOP taking these medications        ciprofloxacin 500 MG tablet  Commonly known as:  CIPRO      TAKE these medications        aspirin EC 81 MG tablet  Take 81 mg by  mouth daily.     insulin aspart 100 UNIT/ML injection  Commonly known as:  novoLOG  Inject 8 Units into the skin 3 (three) times daily before meals. If blood sugar over 185     LEVEMIR FLEXTOUCH 100 UNIT/ML Pen  Generic drug:  Insulin Detemir  Inject 45 Units into the skin at bedtime.     levofloxacin 750 MG tablet  Commonly known as:  LEVAQUIN  Take 1 tablet (750 mg total) by mouth daily at 3 pm.     oxyCODONE-acetaminophen 5-325 MG per tablet  Commonly known as:  ROXICET  Take 1 tablet by mouth every 4 (four) hours as needed for severe pain.     polyethylene glycol packet  Commonly known as:  MIRALAX / GLYCOLAX  Take 17 g by mouth daily.     vancomycin 1,250 mg in sodium chloride 0.9 % 250 mL  Inject 1,250 mg into the vein every 12 (twelve) hours.           Follow-up Information    Follow up with Erlean Mealor V, MD In 2 weeks.   Specialty:  Orthopedic Surgery   Contact information:   [address] [place] [address] [phone]       Signed: Nadara MustardDUDA,Asheton Scheffler V 09/11/2014, 6:29 AM

## 2014-09-12 LAB — ANAEROBIC CULTURE

## 2014-09-12 LAB — TYPE AND SCREEN
ABO/RH(D): A POS
ANTIBODY SCREEN: NEGATIVE
UNIT DIVISION: 0
Unit division: 0
Unit division: 0
Unit division: 0

## 2014-09-13 ENCOUNTER — Non-Acute Institutional Stay (SKILLED_NURSING_FACILITY): Payer: Medicare Other | Admitting: Internal Medicine

## 2014-09-13 DIAGNOSIS — I10 Essential (primary) hypertension: Secondary | ICD-10-CM | POA: Diagnosis not present

## 2014-09-13 DIAGNOSIS — T8459XS Infection and inflammatory reaction due to other internal joint prosthesis, sequela: Secondary | ICD-10-CM | POA: Diagnosis not present

## 2014-09-13 DIAGNOSIS — D500 Iron deficiency anemia secondary to blood loss (chronic): Secondary | ICD-10-CM | POA: Diagnosis not present

## 2014-09-13 DIAGNOSIS — E119 Type 2 diabetes mellitus without complications: Secondary | ICD-10-CM | POA: Diagnosis not present

## 2014-09-13 DIAGNOSIS — M869 Osteomyelitis, unspecified: Secondary | ICD-10-CM

## 2014-09-13 DIAGNOSIS — Z96649 Presence of unspecified artificial hip joint: Principal | ICD-10-CM

## 2014-09-13 DIAGNOSIS — E785 Hyperlipidemia, unspecified: Secondary | ICD-10-CM | POA: Diagnosis not present

## 2014-09-13 NOTE — Progress Notes (Signed)
MRN: [id number] Name: Kelly Richardson  Sex: female Age: 64 y.o. DOB: February 18, 1951  [email or web address] Facility/Room:309 Level Of Care: SNF Provider: Merrilee Seashore D Emergency Contacts: Extended Emergency Contact Information Primary Emergency Contact: Webb,Rick Address: [address]          [place], [place] [address] [place] Home Phone: [phone] Mobile Phone: [phone] Relation: Significant other  Code Status: FULL  Allergies: Penicillins; Seldane; Sulfonamide derivatives; Other; Iohexol; Latex; Metformin; Vicks formula 44 cough relief; and Xanax  Chief Complaint  Patient presents with  . New Admit To SNF    HPI: Patient is 64 y.o. female who has now had a second revision of R hip for infection and a second course of IV antibiotics.  Past Medical History  Diagnosis Date  . Diabetes mellitus without complication   . Complication of anesthesia   . PONV (postoperative nausea and vomiting)   . Hypertension   . Heart murmur     as a child  . 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 Arthritis   . Shortness of breath dyspnea     sometimes  at night    Past Surgical History  Procedure Laterality Date  . Total hip arthroplasty Right 2011  . Abdominal hysterectomy    . Cholecystectomy  1995  . Tonsillectomy    . Incision and drainage hip Right 06/08/2014    Procedure: Irrigation and Debridement Right Hip, Revision of Cup and Head of Total Hip with  Placement  of Antibiotic Spacer;  Surgeon: Nadara Mustard, MD;  Location: [facility] OR;  Service: Orthopedics;  Laterality: Right;  . Excisional total hip arthroplasty with antibiotic spacers Right 09/07/2014    Procedure: Excision Total Hip Arthroplasty, Place Antibiotic Spacer;  Surgeon: Nadara Mustard, MD;  Location: [facility] OR;  Service: Orthopedics;  Laterality: Right;      Medication List       This list is accurate as of: 09/13/14 11:59 PM.  Always use your most recent med list.                aspirin EC 81 MG tablet  Take 81 mg by mouth daily.     insulin aspart 100 UNIT/ML injection  Commonly known as:  novoLOG  Inject 8 Units into the skin 3 (three) times daily before meals. If blood sugar over 185     LEVEMIR FLEXTOUCH 100 UNIT/ML Pen  Generic drug:  Insulin Detemir  Inject 45 Units into the skin at bedtime.     levofloxacin 750 MG tablet  Commonly known as:  LEVAQUIN  Take 1 tablet (750 mg total) by mouth daily at 3 pm.     oxyCODONE-acetaminophen 5-325 MG per tablet  Commonly known as:  ROXICET  Take 1 tablet by mouth every 4 (four) hours as needed for severe pain.     polyethylene glycol packet  Commonly known as:  MIRALAX / GLYCOLAX  Take 17 g by mouth daily.     vancomycin 1,250 mg in sodium chloride 0.9 % 250 mL  Inject 1,250 mg into the vein every 12 (twelve) hours.        No orders of the defined types were placed in this encounter.    Immunization History  Administered Date(s) Administered  . Td 12/20/2006    History  Substance Use Topics  . Smoking status: Never Smoker   . Smokeless tobacco: Not on file  . Alcohol Use: No  Family history is noncontributory    Review of Systems  DATA OBTAINED: from patient GENERAL:  no fevers, fatigue, appetite changes SKIN: No itching, rash or wounds EYES: No eye pain, redness, discharge EARS: No earache, tinnitus, change in hearing NOSE: No congestion, drainage or bleeding  MOUTH/THROAT: No mouth or tooth pain, No sore throat RESPIRATORY: No cough, wheezing, SOB CARDIAC: No chest pain, palpitations, lower extremity edema  GI: No abdominal pain, No N/V/D or constipation, No heartburn or reflux  GU: No dysuria, frequency or urgency, or incontinence  MUSCULOSKELETAL: pt is having pain and spasms NEUROLOGIC: No headache, dizziness or focal weakness PSYCHIATRIC: No overt anxiety or sadness, No behavior issue.   Filed Vitals:   09/13/14 1442  BP: 113/71  Pulse: 88  Temp: 98 F (36.7 C)   Resp: 20    Physical Exam  GENERAL APPEARANCE: Alert, conversant,  No acute distress.  SKIN: No diaphoresis rash HEAD: Normocephalic, atraumatic  EYES: Conjunctiva/lids clear. Pupils round, reactive. EOMs intact.  EARS: External exam WNL, canals clear. Hearing grossly normal.  NOSE: No deformity or discharge.  MOUTH/THROAT: Lips w/o lesions  RESPIRATORY: Breathing is even, unlabored. Lung sounds are clear   CARDIOVASCULAR: Heart RRR no murmurs, rubs or gallops. No peripheral edema.   GASTROINTESTINAL: Abdomen is soft, non-tender, not distended w/ normal bowel sounds. GENITOURINARY: Bladder non tender, not distended  MUSCULOSKELETAL: No abnormal joints or musculature NEUROLOGIC:  Cranial nerves 2-12 grossly intact. Moves all extremities  PSYCHIATRIC: Mood and affect appropriate to situation, no behavioral issues  Patient Active Problem List   Diagnosis Date Noted  . Infected prosthetic hip 09/07/2014  . Osteomyelitis of right hip 07/12/2014  . Chest pain 06/22/2014  . Anemia due to blood loss 06/20/2014  . Status post revision of total hip replacement 06/08/2014  . Anxiety state 03/05/2008  . FATIGUE 03/05/2008  . Type 2 diabetes mellitus without complication 01/02/2008  . Hyperlipidemia 01/02/2008  . Essential hypertension 01/02/2008    CBC    Component Value Date/Time   WBC 11.0* 09/10/2014 0400   RBC 3.03* 09/10/2014 0400   HGB 7.9* 09/10/2014 0400   HCT 24.7* 09/10/2014 0400   PLT 227 09/10/2014 0400   MCV 81.5 09/10/2014 0400   LYMPHSABS 3.0 09/10/2014 0400   MONOABS 1.1* 09/10/2014 0400   EOSABS 0.4 09/10/2014 0400   BASOSABS 0.0 09/10/2014 0400    CMP     Component Value Date/Time   NA 135 09/10/2014 0400   K 3.8 09/10/2014 0400   CL 103 09/10/2014 0400   CO2 28 09/10/2014 0400   GLUCOSE 163* 09/10/2014 0400   BUN 20 09/10/2014 0400   CREATININE 1.01 09/10/2014 0400   CALCIUM 8.2* 09/10/2014 0400   PROT 9.0* 09/07/2014 1308   ALBUMIN 4.1 09/07/2014  1308   AST 40* 09/07/2014 1308   ALT 29 09/07/2014 1308   ALKPHOS 88 09/07/2014 1308   BILITOT 0.8 09/07/2014 1308   GFRNONAA 59* 09/10/2014 0400   GFRAA 68* 09/10/2014 0400    Assessment and Plan  Infected prosthetic hip She initially had Right THA in 2011. Did well for several years until, 2015 where she had open wound/ulceration develop x 6 months from mid 2015 onward. 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 She followed up with  Dr. Ninetta Lights in early January where she still had small amt of wound drainage, and sed rate at that time was 73 down from 101 in December. She was converted to oral doxycycline at that time. She has now had ongoing drainage at that site with concern for worsening abscess, leading to osteomyelitis. On 3/4, she underwent total excision of THA s/p antibiotic spacer. OR findings suggests infection coming from femoral shaft.   Osteomyelitis of right hip She initially had Right THA in 2011. Did well for several years until, 2015 where she had open wound/ulceration develop x 6 months from mid 2015 onward. 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 She followed up with Dr. Ninetta Lights in early January where she still had small amt of wound drainage, and sed rate at that time was 73 down from 101 in December. She was converted to oral doxycycline at that time. She has now had ongoing drainage at that site with concern for worsening abscess, leading to osteomyelitis. On 3/4, she  underwent total excision of THA s/p antibiotic spacer. OR findings suggests infection coming from femoral shaft.   Anemia due to blood loss 2/2 to hip surgery;pt tx 2 units PRBC for Hb of 6.7.   Essential hypertension Pt is no longer on BP meds and BP is controlled.   Type 2 diabetes mellitus without complication A1c 7.2 in 06/2014;Plan - no change in regimen of lantus and novolog   Hyperlipidemia Pt no longer on lipitor     Jahlia Omura, Randon Goldsmith, MD

## 2014-09-14 ENCOUNTER — Non-Acute Institutional Stay (SKILLED_NURSING_FACILITY): Payer: Medicare Other | Admitting: Nurse Practitioner

## 2014-09-14 DIAGNOSIS — M869 Osteomyelitis, unspecified: Secondary | ICD-10-CM | POA: Diagnosis not present

## 2014-09-14 DIAGNOSIS — R339 Retention of urine, unspecified: Secondary | ICD-10-CM

## 2014-09-14 DIAGNOSIS — K5900 Constipation, unspecified: Secondary | ICD-10-CM | POA: Diagnosis not present

## 2014-09-14 DIAGNOSIS — R0602 Shortness of breath: Secondary | ICD-10-CM

## 2014-09-14 NOTE — Progress Notes (Signed)
Patient ID: Kelly Richardson, female   DOB: Feb 14, 1951, 64 y.o.   MRN: [id number]    Nursing Home Location:  [facility]   Place of Service: SNF (31)  PCP: Alysia PennaHOLWERDA, SCOTT, MD  Allergies  Allergen Reactions  . Penicillins Anaphylaxis  . Seldane [Terfenadine] Other (See Comments)    N/V, increased heart rate, seizures, hallinations.  . Sulfonamide Derivatives Anaphylaxis  . Other Nausea And Vomiting    "Anything I have ever been given more nausea just increases it."  . Iohexol      Desc: pt states she had a ct in [place] in 2000 and immediately experienced profuse vomiting and sweating.  She said they treated her like she was having a reaction and doesn't remember much else or if meds were given., Onset Date: [id number]   . Latex Other (See Comments)    BLISTERING  . Metformin Other (See Comments)    REACTION: faintness, DIZZINESS ,  . Vicks Formula 44 Cough Relief [Dextromethorphan Hbr] Other (See Comments)    Runs blood pressure up   . Xanax [Alprazolam]     Pt does not want Xanax--history of addiction.    Chief Complaint  Patient presents with  . Acute Visit    HPI:  Patient is a 64 y.o. female seen [facility] at [facility] for acute visit. Pt with a pmh of diabetes htn and complicated ortho hx. [facility] after she underwent total excision of THA s/p antibiotic spacer on 3/4 due to osteomyelitis from complications after a total hip replacement. [facility] pt was unable to void and had not had a BM in several days. Foley Catheter was placed (2 days ago) and miralax started. Pt has yet to have a BM. Pt was seen yesterday by Dr Lyn HollingsheadAlexander and was started on routine pain medication and muscle relaxer due to severe muscle spasms and pain in right leg. Pt notes that pain has improved slightly from this but still having increased muscle spasms.   Review of Systems:  Review of Systems  Constitutional: Negative for activity change,  appetite change, fatigue and unexpected weight change.  HENT: Negative for congestion and hearing loss.   Eyes: Negative.   Respiratory: Positive for shortness of breath. Negative for cough.   Cardiovascular: Negative for chest pain, palpitations and leg swelling.  Gastrointestinal: Positive for constipation. Negative for abdominal pain and diarrhea.  Genitourinary: Positive for difficulty urinating. Negative for dysuria.  Musculoskeletal: Positive for myalgias and joint swelling. Negative for arthralgias.  Skin: Negative for color change and wound.  Neurological: Negative for dizziness and weakness.  Psychiatric/Behavioral: Positive for agitation. Negative for behavioral problems and confusion.    Past Medical History  Diagnosis Date  . Diabetes mellitus without complication   . Complication of anesthesia   . PONV (postoperative nausea and vomiting)   . Hypertension   . Heart murmur     as a child  . 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 Arthritis   . Shortness of breath dyspnea     sometimes  at night   Past Surgical History  Procedure Laterality Date  . Total hip arthroplasty Right 2011  . Abdominal hysterectomy    . Cholecystectomy  1995  . Tonsillectomy    . Incision and drainage hip Right 06/08/2014    Procedure: Irrigation and Debridement Right Hip, Revision of Cup and Head of Total Hip with  Placement  of Antibiotic  Spacer;  Surgeon: Nadara Mustard, MD;  Location: [facility] OR;  Service: Orthopedics;  Laterality: Right;  . Excisional total hip arthroplasty with antibiotic spacers Right 09/07/2014    Procedure: Excision Total Hip Arthroplasty, Place Antibiotic Spacer;  Surgeon: Nadara Mustard, MD;  Location: [facility] OR;  Service: Orthopedics;  Laterality: Right;   Social History:   reports that she has never smoked. She does not have any smokeless tobacco history on file. She reports that she does not drink alcohol or use illicit drugs.  No family history on  file.  Medications: Patient's Medications  New Prescriptions   No medications on file  Previous Medications   ASPIRIN EC 81 MG TABLET    Take 81 mg by mouth daily.   INSULIN ASPART (NOVOLOG) 100 UNIT/ML INJECTION    Inject 8 Units into the skin 3 (three) times daily before meals. If blood sugar over 185   LEVEMIR FLEXTOUCH 100 UNIT/ML SOPN    Inject 45 Units into the skin at bedtime.    LEVOFLOXACIN (LEVAQUIN) 750 MG TABLET    Take 1 tablet (750 mg total) by mouth daily at 3 pm.   METHOCARBAMOL (ROBAXIN) 500 MG TABLET    Take 500 mg by mouth 3 (three) times daily.   OXYCODONE-ACETAMINOPHEN (ROXICET) 5-325 MG PER TABLET    Take 1 tablet by mouth every 4 (four) hours as needed for severe pain.   POLYETHYLENE GLYCOL (MIRALAX / GLYCOLAX) PACKET    Take 17 g by mouth daily.   VANCOMYCIN 1,250 MG IN SODIUM CHLORIDE 0.9 % 250 ML    Inject 1,250 mg into the vein every 12 (twelve) hours.  Modified Medications   No medications on file  Discontinued Medications   No medications on file     Physical Exam: Filed Vitals:   09/14/14 1123  BP: 128/67  Pulse: 87  Temp: 97.3 F (36.3 C)  Resp: 20    Physical Exam  Constitutional: She is oriented to person, place, and time. She appears well-developed and well-nourished. No distress.  HENT:  Head: Normocephalic and atraumatic.  Mouth/Throat: Oropharynx is clear and moist. No oropharyngeal exudate.  Eyes: Conjunctivae are normal. Pupils are equal, round, and reactive to light.  Neck: Normal range of motion. Neck supple.  Cardiovascular: Normal rate, regular rhythm and normal heart sounds.   Pulmonary/Chest: Effort normal. She has decreased breath sounds.  Abdominal: Soft. Bowel sounds are normal. She exhibits no distension. There is no tenderness.  Musculoskeletal: She exhibits tenderness (to right leg). She exhibits no edema.  Neurological: She is alert and oriented to person, place, and time.  Skin: Skin is warm and dry. She is not  diaphoretic. No erythema.  Psychiatric: She has a normal mood and affect.    Labs reviewed: Basic Metabolic Panel:  Recent Labs  16/10/96 0413 09/09/14 0503 09/10/14 0400  NA 134* 133* 135  K 5.3* 3.9 3.8  CL 102 102 103  CO2 GLUCOSE 237* 225* 163*  BUN 20 25* 20  CREATININE 1.25* 1.14* 1.01  CALCIUM 8.3* 8.3* 8.2*   Liver Function Tests:  Recent Labs  06/08/14 1445 07/28/14 1659 09/07/14 1308  AST 64* 23 40*  ALT 73* 23 29  ALKPHOS 106 80 88  BILITOT 0.6 0.3 0.8  PROT 8.5* 6.9 9.0*  ALBUMIN 3.9 3.2* 4.1    Recent Labs  06/13/14 0948  LIPASE 21   No results for input(s): AMMONIA in the last 8760 hours. CBC:  Recent Labs  09/08/14 0413 09/09/14 0503 09/10/14 0400  WBC 10.2 13.4* 11.0*  NEUTROABS 8.0* 8.9* 6.5  HGB 6.7* 8.7* 7.9*  HCT 21.4* 27.0* 24.7*  MCV 79.0 80.1 81.5  PLT 256 253 227   TSH: No results for input(s): TSH in the last 8760 hours. A1C: Lab Results  Component Value Date   HGBA1C 7.2* 06/08/2014   Lipid Panel: No results for input(s): CHOL, HDL, LDLCALC, TRIG, CHOLHDL, LDLDIRECT in the last 8760 hours.    Assessment/Plan 1. Osteomyelitis of right hip S/p  total excision of THA s/p antibiotic spacer. Being followed by ID due to infected prosthetic. Pain is still a problem. Encouraged used of PRN percocet and will increase robaxin from TID to QID.  2. Constipation, unspecified constipation type Still an issue, will start amitiza 24 mcg PO BID, will cont miralax for 3 additional days then make PRN  3. Urinary retention Complicated by pain. Will attempt voiding trail at this time. Foley to be DC'd if unable to void after 8 hours to reinsert foley and consult urology.   4. Shortness of Breath Pt reports worsening shortness of breath, will get chest xray, cbc and bmp at this time to further evaluate

## 2014-09-16 ENCOUNTER — Encounter: Payer: Self-pay | Admitting: Internal Medicine

## 2014-09-16 NOTE — Assessment & Plan Note (Signed)
A1c 7.2 in 06/2014;Plan - no change in regimen of lantus and novolog

## 2014-09-16 NOTE — Assessment & Plan Note (Signed)
She initially had Right THA in 2011. Did well for several years until, 2015 where she had open wound/ulceration develop x 6 months from mid 2015 onward. 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 She followed up with Dr. Ninetta LightsHatcher in early January where she still had small amt of wound drainage, and sed rate at that time was 73 down from 101 in December. She was converted to oral doxycycline at that time. She has now had ongoing drainage at that site with concern for worsening abscess, leading to osteomyelitis. On 3/4, she underwent total excision of THA s/p antibiotic spacer. OR findings suggests infection coming from femoral shaft.

## 2014-09-16 NOTE — Assessment & Plan Note (Signed)
Pt no longer on lipitor

## 2014-09-16 NOTE — Assessment & Plan Note (Signed)
2/2 to hip surgery;pt tx 2 units PRBC for Hb of 6.7.

## 2014-09-16 NOTE — Assessment & Plan Note (Signed)
Pt is no longer on BP meds and BP is controlled.

## 2014-09-16 NOTE — Assessment & Plan Note (Addendum)
She initially had Right THA in 2011. Did well for several years until, 2015 where she had open wound/ulceration develop x 6 months from mid 2015 onward. 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 She followed up with Dr. Ninetta LightsHatcher in early January where she still had small amt of wound drainage, and sed rate at that time was 73 down from 101 in December. She was converted to oral doxycycline at that time. She has now had ongoing drainage at that site with concern for worsening abscess, leading to osteomyelitis. On 3/4, she underwent total excision of THA s/p antibiotic spacer. OR findings suggests infection coming from femoral shaft. Cultures were taken and are pending,  Started robaxin for spasms and have scheduled pain medication with prn in addition.

## 2014-09-19 ENCOUNTER — Encounter (HOSPITAL_COMMUNITY): Payer: Self-pay | Admitting: Orthopedic Surgery

## 2014-10-11 ENCOUNTER — Ambulatory Visit: Payer: Medicare Other | Admitting: Infectious Diseases

## 2014-10-15 ENCOUNTER — Ambulatory Visit: Payer: Medicare Other | Admitting: Infectious Diseases

## 2014-10-23 DIAGNOSIS — T8451XS Infection and inflammatory reaction due to internal right hip prosthesis, sequela: Secondary | ICD-10-CM | POA: Diagnosis not present

## 2014-10-23 DIAGNOSIS — M1611 Unilateral primary osteoarthritis, right hip: Secondary | ICD-10-CM | POA: Diagnosis not present

## 2014-10-23 DIAGNOSIS — Z96641 Presence of right artificial hip joint: Secondary | ICD-10-CM | POA: Diagnosis not present

## 2014-10-30 ENCOUNTER — Encounter: Payer: Self-pay | Admitting: Infectious Diseases

## 2014-10-30 ENCOUNTER — Ambulatory Visit (INDEPENDENT_AMBULATORY_CARE_PROVIDER_SITE_OTHER): Payer: Medicare Other | Admitting: Infectious Diseases

## 2014-10-30 VITALS — BP 125/84 | HR 91 | Temp 98.5°F | Ht 61.0 in | Wt 209.0 lb

## 2014-10-30 DIAGNOSIS — E139 Other specified diabetes mellitus without complications: Secondary | ICD-10-CM

## 2014-10-30 DIAGNOSIS — E1165 Type 2 diabetes mellitus with hyperglycemia: Principal | ICD-10-CM

## 2014-10-30 DIAGNOSIS — M869 Osteomyelitis, unspecified: Secondary | ICD-10-CM

## 2014-10-30 DIAGNOSIS — IMO0001 Reserved for inherently not codable concepts without codable children: Secondary | ICD-10-CM

## 2014-10-30 DIAGNOSIS — Z794 Long term (current) use of insulin: Principal | ICD-10-CM

## 2014-10-30 NOTE — Assessment & Plan Note (Signed)
She feels like this is improved. She will need continued close f/u when she gets out of SNF. She states she knows how to cook healthy meals.

## 2014-10-30 NOTE — Assessment & Plan Note (Signed)
She appears to be doing well at this point. Will watch her off anbx.  Will pull her pic today.  Continue ot/pt.  Will see her back in 4 months to f/u.

## 2014-10-30 NOTE — Progress Notes (Signed)
   Subjective:    Patient ID: Katherene Pontondrea J Gossman, female    DOB: [birth year], 64 y.o.   MRN: [id number]  HPI 64 yo F with hx of DM2 for last year, who had R THR ~2011. She had open wound and clear d/c for the last 6 months. She was seen by her PMD and was given topical anbx for this. In November, her leg began to swell and she was started on po doxy. She states that the d/c became purulent at this point. She was sent to general surgery who immediately sent her to ortho. She had a CT showing a possible abscess and she was [facility] on 12-4. She underwent Irrigation and Debridement Right Hip, Revision of Cup and Head of Total Hip with Placement of Antibiotic Beads with 1g vancomycin and 240 mg gentamicin. She was started on vanco/levaqun and then was d/c home on 12-8 on same. Her Cx were (-).   She was seen in ID on 1-7 and had her IV anbx stopped. She was then changed to po doxy. Had no trouble taking these. Stopped taking these when she ran out.   Since 08-25-14 she noticed swelling of her R hip and worsening of the hole in her hip. No f/c. No pain. She noted a blister over her wound. No d/c from her wound. She saw Dr Lajoyce Cornersuda and had aspirate done, no growth. She was started on cipro. She was seen in ID on 3-2 and was then admitted on 3-4 and she underwent resection of her THR with placement of anbx spacer. She was restarted on vanco/levaquin. Her operative/tissue Cx was poly-microbial.   Today she states she has been off anbx for ~1 week. No problems with PIC or anbx prior. No f/c.  Wound is well healed. No d/c. No wt bearing. Painful if dependent, is going to PT/OT daily.  FSG have been 80-90 in AM. Higher around when she eats. States she needs very little SSI.   Review of Systems  Constitutional: Negative for fever and chills.  Gastrointestinal: Negative for diarrhea and constipation.  Genitourinary: Negative for difficulty urinating.  Musculoskeletal: Positive for arthralgias and  gait problem.  has lost wt.      Objective:   Physical Exam  Constitutional: She appears well-developed and well-nourished.  HENT:  Mouth/Throat: No oropharyngeal exudate.  Eyes: EOM are normal. Pupils are equal, round, and reactive to light.  Neck: Neck supple.  Cardiovascular: Normal rate, regular rhythm and normal heart sounds.   Pulmonary/Chest: Effort normal and breath sounds normal.  Abdominal: Soft. Bowel sounds are normal. She exhibits no distension. There is no tenderness.  Musculoskeletal:       Arms: Lymphadenopathy:    She has no cervical adenopathy.  she cannot stand for me to examine her wound.     Assessment & Plan:

## 2014-11-04 DIAGNOSIS — D62 Acute posthemorrhagic anemia: Secondary | ICD-10-CM | POA: Diagnosis not present

## 2014-11-04 DIAGNOSIS — Y831 Surgical operation with implant of artificial internal device as the cause of abnormal reaction of the patient, or of later complication, without mention of misadventure at the time of the procedure: Secondary | ICD-10-CM | POA: Diagnosis present

## 2014-11-04 DIAGNOSIS — M869 Osteomyelitis, unspecified: Secondary | ICD-10-CM | POA: Diagnosis not present

## 2014-11-04 DIAGNOSIS — Z4732 Aftercare following explantation of hip joint prosthesis: Secondary | ICD-10-CM | POA: Diagnosis not present

## 2014-11-04 DIAGNOSIS — Z792 Long term (current) use of antibiotics: Secondary | ICD-10-CM | POA: Diagnosis not present

## 2014-11-04 DIAGNOSIS — Z96649 Presence of unspecified artificial hip joint: Secondary | ICD-10-CM | POA: Diagnosis not present

## 2014-11-04 DIAGNOSIS — Z794 Long term (current) use of insulin: Secondary | ICD-10-CM | POA: Diagnosis not present

## 2014-11-04 DIAGNOSIS — Z7982 Long term (current) use of aspirin: Secondary | ICD-10-CM | POA: Diagnosis not present

## 2014-11-04 DIAGNOSIS — B999 Unspecified infectious disease: Secondary | ICD-10-CM | POA: Diagnosis not present

## 2014-11-04 DIAGNOSIS — M6281 Muscle weakness (generalized): Secondary | ICD-10-CM | POA: Diagnosis not present

## 2014-11-04 DIAGNOSIS — Z471 Aftercare following joint replacement surgery: Secondary | ICD-10-CM | POA: Diagnosis not present

## 2014-11-04 DIAGNOSIS — Z96641 Presence of right artificial hip joint: Secondary | ICD-10-CM | POA: Diagnosis not present

## 2014-11-04 DIAGNOSIS — F411 Generalized anxiety disorder: Secondary | ICD-10-CM | POA: Diagnosis not present

## 2014-11-04 DIAGNOSIS — T8451XS Infection and inflammatory reaction due to internal right hip prosthesis, sequela: Secondary | ICD-10-CM | POA: Diagnosis not present

## 2014-11-04 DIAGNOSIS — D500 Iron deficiency anemia secondary to blood loss (chronic): Secondary | ICD-10-CM | POA: Diagnosis not present

## 2014-11-04 DIAGNOSIS — E119 Type 2 diabetes mellitus without complications: Secondary | ICD-10-CM | POA: Diagnosis present

## 2014-11-04 DIAGNOSIS — M1611 Unilateral primary osteoarthritis, right hip: Secondary | ICD-10-CM | POA: Diagnosis not present

## 2014-11-04 DIAGNOSIS — I10 Essential (primary) hypertension: Secondary | ICD-10-CM | POA: Diagnosis not present

## 2014-11-04 DIAGNOSIS — M25551 Pain in right hip: Secondary | ICD-10-CM | POA: Diagnosis present

## 2014-11-04 DIAGNOSIS — T8451XD Infection and inflammatory reaction due to internal right hip prosthesis, subsequent encounter: Secondary | ICD-10-CM | POA: Diagnosis not present

## 2014-11-04 DIAGNOSIS — T8451XA Infection and inflammatory reaction due to internal right hip prosthesis, initial encounter: Secondary | ICD-10-CM | POA: Diagnosis not present

## 2014-11-04 DIAGNOSIS — Z452 Encounter for adjustment and management of vascular access device: Secondary | ICD-10-CM | POA: Diagnosis not present

## 2014-11-05 ENCOUNTER — Non-Acute Institutional Stay (SKILLED_NURSING_FACILITY): Payer: Medicare Other | Admitting: Internal Medicine

## 2014-11-05 ENCOUNTER — Encounter: Payer: Self-pay | Admitting: Internal Medicine

## 2014-11-05 DIAGNOSIS — F411 Generalized anxiety disorder: Secondary | ICD-10-CM | POA: Diagnosis not present

## 2014-11-05 DIAGNOSIS — D500 Iron deficiency anemia secondary to blood loss (chronic): Secondary | ICD-10-CM

## 2014-11-05 DIAGNOSIS — M869 Osteomyelitis, unspecified: Secondary | ICD-10-CM | POA: Diagnosis not present

## 2014-11-05 NOTE — Assessment & Plan Note (Signed)
Despite a very prolonged treatment pt is in good spirits, doing art and reading and displays no anxiety.

## 2014-11-05 NOTE — Assessment & Plan Note (Signed)
Repeat CBC ordered to recheck Hb before surgery.

## 2014-11-05 NOTE — Progress Notes (Signed)
MRN: [id number] Name: Kelly Richardson  Sex: female Age: 64 y.o. DOB: Apr 09, 1951  [facility] Facility/Room:309 Level Of Care: SNF Provider: Merrilee SeashoreALEXANDER, Tanya Marvin D Emergency Contacts: Extended Emergency Contact Information Primary Emergency Contact: Webb,Rick Address: [address]          [place], [place] [id number] [place] Home Phone: [phone] Mobile Phone: [phone] Relation: Significant other  Code Status:FULL   Allergies: Penicillins; Seldane; Sulfonamide derivatives; Other; Iohexol; Latex; Metformin; Vicks formula 44 cough relief; and Xanax  Chief Complaint  Patient presents with  . Medical Management of Chronic Issues    HPI: Patient is 64 y.o. female who was being seen for need for a urology consult which pt declined. Pt was seen for f/u on finishing antibiotic therapy.  Past Medical History  Diagnosis Date  . Diabetes mellitus without complication   . Complication of anesthesia   . PONV (postoperative nausea and vomiting)   . Hypertension   . Heart murmur     as a child  . 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 Arthritis   . Shortness of breath dyspnea     sometimes  at night    Past Surgical History  Procedure Laterality Date  . Total hip arthroplasty Right 2011  . Abdominal hysterectomy    . Cholecystectomy  1995  . Tonsillectomy    . Incision and drainage hip Right 06/08/2014    Procedure: Irrigation and Debridement Right Hip, Revision of Cup and Head of Total Hip with  Placement  of Antibiotic Spacer;  Surgeon: Nadara MustardMarcus Duda V, MD;  Location: [facility] OR;  Service: Orthopedics;  Laterality: Right;  . Excisional total hip arthroplasty with antibiotic spacers Right 09/07/2014    Procedure: Excision Total Hip Arthroplasty, Place Antibiotic Spacer;  Surgeon: Nadara MustardMarcus Duda V, MD;  Location: [facility] OR;  Service: Orthopedics;  Laterality: Right;      Medication List       This list is accurate as of: 11/05/14  7:50 PM.  Always use  your most recent med list.               acetaminophen 325 MG tablet  Commonly known as:  TYLENOL  Take 650 mg by mouth every 6 (six) hours as needed.     aspirin EC 81 MG tablet  Take 81 mg by mouth daily.     insulin aspart 100 UNIT/ML injection  Commonly known as:  novoLOG  Inject 8 Units into the skin 3 (three) times daily before meals. If blood sugar over 185     LEVEMIR FLEXTOUCH 100 UNIT/ML Pen  Generic drug:  Insulin Detemir  Inject 45 Units into the skin at bedtime.     methocarbamol 500 MG tablet  Commonly known as:  ROBAXIN  Take 500 mg by mouth 3 (three) times daily.     oxyCODONE-acetaminophen 5-325 MG per tablet  Commonly known as:  ROXICET  Take 1 tablet by mouth every 4 (four) hours as needed for severe pain.     polyethylene glycol packet  Commonly known as:  MIRALAX / GLYCOLAX  Take 17 g by mouth daily.        No orders of the defined types were placed in this encounter.    Immunization History  Administered Date(s) Administered  . Td 12/20/2006    History  Substance Use Topics  . Smoking status: Never Smoker   . Smokeless tobacco: Not on file  . Alcohol Use: No  Review of Systems  DATA OBTAINED: from patient GENERAL:  no fevers, fatigue, appetite changes SKIN: No itching, rash HEENT: No complaint RESPIRATORY: No cough, wheezing, SOB CARDIAC: No chest pain, palpitations, lower extremity edema  GI: No abdominal pain, No N/V/D or constipation, No heartburn or reflux  GU: No dysuria, frequency or urgency, or incontinence  MUSCULOSKELETAL: No unrelieved bone/joint pain NEUROLOGIC: No headache, dizziness  PSYCHIATRIC: No overt anxiety or sadness  Filed Vitals:   11/05/14 1413  BP: 111/69  Pulse: 81  Temp: 97 F (36.1 C)  Resp: 20    Physical Exam  GENERAL APPEARANCE: Alert, conversant, WF, No acute distress  SKIN: No diaphoresis rash HEENT: Unremarkable RESPIRATORY: Breathing is even, unlabored. Lung sounds are clear    CARDIOVASCULAR: Heart RRR no murmurs, rubs or gallops. No peripheral edema  GASTROINTESTINAL: Abdomen is soft, non-tender, not distended w/ normal bowel sounds.  GENITOURINARY: Bladder non tender, not distended  MUSCULOSKELETAL: No abnormal joints or musculature NEUROLOGIC: Cranial nerves 2-12 grossly intact. Moves all extremities PSYCHIATRIC: Mood and affect appropriate to situation, no behavioral issues  Patient Active Problem List   Diagnosis Date Noted  . Osteomyelitis of right hip 07/12/2014  . Chest pain 06/22/2014  . Anemia due to blood loss 06/20/2014  . Status post revision of total hip replacement 06/08/2014  . Anxiety state 03/05/2008  . FATIGUE 03/05/2008  . Uncontrolled insulin dependent diabetes mellitus 01/02/2008  . Hyperlipidemia 01/02/2008  . Essential hypertension 01/02/2008    CBC    Component Value Date/Time   WBC 11.0* 09/10/2014 0400   RBC 3.03* 09/10/2014 0400   HGB 7.9* 09/10/2014 0400   HCT 24.7* 09/10/2014 0400   PLT 227 09/10/2014 0400   MCV 81.5 09/10/2014 0400   LYMPHSABS 3.0 09/10/2014 0400   MONOABS 1.1* 09/10/2014 0400   EOSABS 0.4 09/10/2014 0400   BASOSABS 0.0 09/10/2014 0400    CMP     Component Value Date/Time   NA 135 09/10/2014 0400   K 3.8 09/10/2014 0400   CL 103 09/10/2014 0400   CO2 28 09/10/2014 0400   GLUCOSE 163* 09/10/2014 0400   BUN 20 09/10/2014 0400   CREATININE 1.01 09/10/2014 0400   CALCIUM 8.2* 09/10/2014 0400   PROT 9.0* 09/07/2014 1308   ALBUMIN 4.1 09/07/2014 1308   AST 40* 09/07/2014 1308   ALT 29 09/07/2014 1308   ALKPHOS 88 09/07/2014 1308   BILITOT 0.8 09/07/2014 1308   GFRNONAA 59* 09/10/2014 0400   GFRAA 68* 09/10/2014 0400    Assessment and Plan  Need for urology visit - nursing reported that pt declined to see urology as outpt. I scanned records, both paper and electronic and see nowhere why a urology appt would be necessary. I discussed this with pt. Will do f/u BUN/Cr.  Osteomyelitis of  right hip Pt finished antibiotics a week ago. Now waiting for a surgery time from Dr Lajoyce Corners after which pt will return to SNF for OT/PT. Pt's most recent BUN/Cr  on 4/3 16/0.6; pt has received vancomycin since then so will order BMP.   Anemia due to blood loss Repeat CBC ordered to recheck Hb before surgery.   Anxiety state Despite a very prolonged treatment pt is in good spirits, doing art and reading and displays no anxiety.    Visit with pt 25 minutes Margit Hanks, MD

## 2014-11-05 NOTE — Assessment & Plan Note (Signed)
Pt finished antibiotics a week ago. Now waiting for a surgery time from Dr Lajoyce Cornersuda after which pt will return to SNF for OT/PT. Pt's most recent BUN/Cr  on 4/3 16/0.6; pt has received vancomycin since then so will order BMP.

## 2014-11-06 ENCOUNTER — Other Ambulatory Visit (HOSPITAL_COMMUNITY): Payer: Self-pay | Admitting: Orthopedic Surgery

## 2014-11-08 ENCOUNTER — Encounter (HOSPITAL_COMMUNITY): Payer: Self-pay | Admitting: *Deleted

## 2014-11-08 MED ORDER — CLINDAMYCIN PHOSPHATE 900 MG/50ML IV SOLN
900.0000 mg | INTRAVENOUS | Status: AC
  Administered 2014-11-09: 900 mg via INTRAVENOUS
  Filled 2014-11-08: qty 50

## 2014-11-08 MED ORDER — CHLORHEXIDINE GLUCONATE 4 % EX LIQD
60.0000 mL | Freq: Once | CUTANEOUS | Status: DC
  Filled 2014-11-08: qty 60

## 2014-11-08 NOTE — Progress Notes (Signed)
SDW -pre-op call completed by pt and pt nurse, Princess. Pt denies SOB, chest pain and being under the care of a cardiologist. Pt stated that she did have a stress test greater than five years ago but cannot remember the MD's name or  location. Princes made aware to not administer any diabetic medications the morning of procedure. Nurse also made aware to stop Aspirin, NSAID's otc vitamins and herbal medications.

## 2014-11-09 ENCOUNTER — Inpatient Hospital Stay (HOSPITAL_COMMUNITY): Payer: Medicare Other

## 2014-11-09 ENCOUNTER — Encounter (HOSPITAL_COMMUNITY)
Admission: RE | Disposition: A | Discharge status: Skilled Nursing Facility | Payer: Self-pay | Source: Ambulatory Visit | Attending: Orthopedic Surgery

## 2014-11-09 ENCOUNTER — Inpatient Hospital Stay (HOSPITAL_COMMUNITY): Payer: Medicare Other | Admitting: Anesthesiology

## 2014-11-09 ENCOUNTER — Inpatient Hospital Stay (HOSPITAL_COMMUNITY)
Admission: RE | Admit: 2014-11-09 | Discharge: 2014-11-13 | DRG: 467 | Disposition: A | Discharge status: Skilled Nursing Facility | Payer: Medicare Other | Source: Ambulatory Visit | Attending: Orthopedic Surgery | Admitting: Orthopedic Surgery

## 2014-11-09 DIAGNOSIS — D62 Acute posthemorrhagic anemia: Secondary | ICD-10-CM | POA: Diagnosis not present

## 2014-11-09 DIAGNOSIS — T8451XS Infection and inflammatory reaction due to internal right hip prosthesis, sequela: Secondary | ICD-10-CM | POA: Diagnosis not present

## 2014-11-09 DIAGNOSIS — Z91040 Latex allergy status: Secondary | ICD-10-CM | POA: Diagnosis not present

## 2014-11-09 DIAGNOSIS — Z96649 Presence of unspecified artificial hip joint: Secondary | ICD-10-CM

## 2014-11-09 DIAGNOSIS — Z7982 Long term (current) use of aspirin: Secondary | ICD-10-CM | POA: Diagnosis not present

## 2014-11-09 DIAGNOSIS — Z880 Allergy status to penicillin: Secondary | ICD-10-CM | POA: Diagnosis not present

## 2014-11-09 DIAGNOSIS — B999 Unspecified infectious disease: Secondary | ICD-10-CM | POA: Diagnosis not present

## 2014-11-09 DIAGNOSIS — Y831 Surgical operation with implant of artificial internal device as the cause of abnormal reaction of the patient, or of later complication, without mention of misadventure at the time of the procedure: Secondary | ICD-10-CM | POA: Diagnosis present

## 2014-11-09 DIAGNOSIS — T8451XA Infection and inflammatory reaction due to internal right hip prosthesis, initial encounter: Principal | ICD-10-CM | POA: Diagnosis present

## 2014-11-09 DIAGNOSIS — M1990 Unspecified osteoarthritis, unspecified site: Secondary | ICD-10-CM | POA: Diagnosis not present

## 2014-11-09 DIAGNOSIS — M25551 Pain in right hip: Secondary | ICD-10-CM | POA: Diagnosis not present

## 2014-11-09 DIAGNOSIS — Z471 Aftercare following joint replacement surgery: Secondary | ICD-10-CM | POA: Diagnosis not present

## 2014-11-09 DIAGNOSIS — M1611 Unilateral primary osteoarthritis, right hip: Secondary | ICD-10-CM | POA: Diagnosis not present

## 2014-11-09 DIAGNOSIS — Z9889 Other specified postprocedural states: Secondary | ICD-10-CM | POA: Diagnosis not present

## 2014-11-09 DIAGNOSIS — I10 Essential (primary) hypertension: Secondary | ICD-10-CM | POA: Diagnosis present

## 2014-11-09 DIAGNOSIS — Z96641 Presence of right artificial hip joint: Secondary | ICD-10-CM

## 2014-11-09 DIAGNOSIS — Z794 Long term (current) use of insulin: Secondary | ICD-10-CM | POA: Diagnosis not present

## 2014-11-09 DIAGNOSIS — R269 Unspecified abnormalities of gait and mobility: Secondary | ICD-10-CM | POA: Diagnosis not present

## 2014-11-09 DIAGNOSIS — R011 Cardiac murmur, unspecified: Secondary | ICD-10-CM | POA: Diagnosis not present

## 2014-11-09 DIAGNOSIS — M25559 Pain in unspecified hip: Secondary | ICD-10-CM | POA: Diagnosis not present

## 2014-11-09 DIAGNOSIS — E119 Type 2 diabetes mellitus without complications: Secondary | ICD-10-CM | POA: Diagnosis present

## 2014-11-09 DIAGNOSIS — Z0000 Encounter for general adult medical examination without abnormal findings: Secondary | ICD-10-CM | POA: Diagnosis not present

## 2014-11-09 HISTORY — PX: TOTAL HIP REVISION: SHX763

## 2014-11-09 LAB — CBC
HCT: 37.1 % (ref 36.0–46.0)
Hemoglobin: 11.5 g/dL — ABNORMAL LOW (ref 12.0–15.0)
MCH: 22.6 pg — AB (ref 26.0–34.0)
MCHC: 31 g/dL (ref 30.0–36.0)
MCV: 72.9 fL — ABNORMAL LOW (ref 78.0–100.0)
Platelets: 230 10*3/uL (ref 150–400)
RBC: 5.09 MIL/uL (ref 3.87–5.11)
RDW: 16.2 % — ABNORMAL HIGH (ref 11.5–15.5)
WBC: 7.3 10*3/uL (ref 4.0–10.5)

## 2014-11-09 LAB — COMPREHENSIVE METABOLIC PANEL
ALT: 26 U/L (ref 14–54)
AST: 50 U/L — ABNORMAL HIGH (ref 15–41)
Albumin: 4.1 g/dL (ref 3.5–5.0)
Alkaline Phosphatase: 92 U/L (ref 38–126)
Anion gap: 13 (ref 5–15)
BILIRUBIN TOTAL: 1.5 mg/dL — AB (ref 0.3–1.2)
BUN: 18 mg/dL (ref 6–20)
CALCIUM: 9.7 mg/dL (ref 8.9–10.3)
CHLORIDE: 104 mmol/L (ref 101–111)
CO2: 21 mmol/L — AB (ref 22–32)
Creatinine, Ser: 0.82 mg/dL (ref 0.44–1.00)
GLUCOSE: 125 mg/dL — AB (ref 70–99)
Potassium: 5.5 mmol/L — ABNORMAL HIGH (ref 3.5–5.1)
Sodium: 138 mmol/L (ref 135–145)
Total Protein: 7.5 g/dL (ref 6.5–8.1)

## 2014-11-09 LAB — GLUCOSE, CAPILLARY
GLUCOSE-CAPILLARY: 110 mg/dL — AB (ref 70–99)
GLUCOSE-CAPILLARY: 124 mg/dL — AB (ref 70–99)
GLUCOSE-CAPILLARY: 183 mg/dL — AB (ref 70–99)
Glucose-Capillary: 149 mg/dL — ABNORMAL HIGH (ref 70–99)

## 2014-11-09 LAB — SURGICAL PCR SCREEN
MRSA, PCR: NEGATIVE
Staphylococcus aureus: POSITIVE — AB

## 2014-11-09 LAB — PROTIME-INR
INR: 1.01 (ref 0.00–1.49)
Prothrombin Time: 13.4 seconds (ref 11.6–15.2)

## 2014-11-09 LAB — GRAM STAIN

## 2014-11-09 LAB — APTT: APTT: 26 s (ref 24–37)

## 2014-11-09 SURGERY — TOTAL HIP REVISION
Anesthesia: General | Site: Hip | Laterality: Right

## 2014-11-09 MED ORDER — SODIUM CHLORIDE 0.9 % IV SOLN
INTRAVENOUS | Status: DC
  Administered 2014-11-09: 22:00:00 via INTRAVENOUS
  Administered 2014-11-12: 20 mL/h via INTRAVENOUS

## 2014-11-09 MED ORDER — FENTANYL CITRATE (PF) 250 MCG/5ML IJ SOLN
INTRAMUSCULAR | Status: AC
  Filled 2014-11-09: qty 5

## 2014-11-09 MED ORDER — MIDAZOLAM HCL 5 MG/5ML IJ SOLN
INTRAMUSCULAR | Status: DC | PRN
  Administered 2014-11-09: 2 mg via INTRAVENOUS

## 2014-11-09 MED ORDER — INSULIN ASPART 100 UNIT/ML ~~LOC~~ SOLN
0.0000 [IU] | Freq: Three times a day (TID) | SUBCUTANEOUS | Status: DC
Start: 2014-11-10 — End: 2014-11-13
  Administered 2014-11-10 – 2014-11-12 (×2): 3 [IU] via SUBCUTANEOUS
  Administered 2014-11-12: 4 [IU] via SUBCUTANEOUS

## 2014-11-09 MED ORDER — FENTANYL CITRATE (PF) 100 MCG/2ML IJ SOLN
INTRAMUSCULAR | Status: DC | PRN
  Administered 2014-11-09: 150 ug via INTRAVENOUS
  Administered 2014-11-09 (×2): 100 ug via INTRAVENOUS

## 2014-11-09 MED ORDER — ACETAMINOPHEN 325 MG PO TABS: 650.0000 mg | ORAL_TABLET | Freq: Four times a day (QID) | ORAL | Status: DC | PRN

## 2014-11-09 MED ORDER — INSULIN DETEMIR 100 UNIT/ML FLEXPEN: 45.0000 [IU] | PEN_INJECTOR | Freq: Every day | SUBCUTANEOUS | Status: DC

## 2014-11-09 MED ORDER — DEXTROSE 5 % IV SOLN
500.0000 mg | Freq: Four times a day (QID) | INTRAVENOUS | Status: DC | PRN
  Filled 2014-11-09: qty 5

## 2014-11-09 MED ORDER — ASPIRIN EC 325 MG PO TBEC
325.0000 mg | DELAYED_RELEASE_TABLET | Freq: Every day | ORAL | Status: DC
  Administered 2014-11-10 – 2014-11-13 (×4): 325 mg via ORAL
  Filled 2014-11-09 (×4): qty 1

## 2014-11-09 MED ORDER — PROPOFOL 10 MG/ML IV BOLUS
INTRAVENOUS | Status: DC | PRN
  Administered 2014-11-09: 180 mg via INTRAVENOUS

## 2014-11-09 MED ORDER — EPHEDRINE SULFATE 50 MG/ML IJ SOLN
INTRAMUSCULAR | Status: AC
  Filled 2014-11-09: qty 1

## 2014-11-09 MED ORDER — GLYCOPYRROLATE 0.2 MG/ML IJ SOLN
INTRAMUSCULAR | Status: AC
  Filled 2014-11-09: qty 3

## 2014-11-09 MED ORDER — PHENYLEPHRINE HCL 10 MG/ML IJ SOLN
INTRAMUSCULAR | Status: DC | PRN
  Administered 2014-11-09: 80 ug via INTRAVENOUS
  Administered 2014-11-09: 120 ug via INTRAVENOUS
  Administered 2014-11-09: 80 ug via INTRAVENOUS

## 2014-11-09 MED ORDER — MUPIROCIN 2 % EX OINT
TOPICAL_OINTMENT | CUTANEOUS | Status: AC
  Filled 2014-11-09: qty 22

## 2014-11-09 MED ORDER — 0.9 % SODIUM CHLORIDE (POUR BTL) OPTIME
TOPICAL | Status: DC | PRN
  Administered 2014-11-09: 1000 mL

## 2014-11-09 MED ORDER — MIDAZOLAM HCL 2 MG/2ML IJ SOLN
INTRAMUSCULAR | Status: AC
  Filled 2014-11-09: qty 2

## 2014-11-09 MED ORDER — ONDANSETRON HCL 4 MG/2ML IJ SOLN
INTRAMUSCULAR | Status: AC
  Filled 2014-11-09: qty 2

## 2014-11-09 MED ORDER — GLYCOPYRROLATE 0.2 MG/ML IJ SOLN
INTRAMUSCULAR | Status: DC | PRN
  Administered 2014-11-09: 0.4 mg via INTRAVENOUS

## 2014-11-09 MED ORDER — NEOSTIGMINE METHYLSULFATE 10 MG/10ML IV SOLN
INTRAVENOUS | Status: AC
  Filled 2014-11-09: qty 1

## 2014-11-09 MED ORDER — NEOSTIGMINE METHYLSULFATE 10 MG/10ML IV SOLN
INTRAVENOUS | Status: DC | PRN
  Administered 2014-11-09: 3 mg via INTRAVENOUS

## 2014-11-09 MED ORDER — MEPERIDINE HCL 25 MG/ML IJ SOLN: 6.2500 mg | INTRAMUSCULAR | Status: DC | PRN

## 2014-11-09 MED ORDER — ONDANSETRON HCL 4 MG PO TABS: 4.0000 mg | ORAL_TABLET | Freq: Four times a day (QID) | ORAL | Status: DC | PRN

## 2014-11-09 MED ORDER — CLINDAMYCIN PHOSPHATE 600 MG/50ML IV SOLN
600.0000 mg | Freq: Four times a day (QID) | INTRAVENOUS | Status: AC
  Administered 2014-11-09 – 2014-11-10 (×2): 600 mg via INTRAVENOUS
  Filled 2014-11-09 (×2): qty 50

## 2014-11-09 MED ORDER — LACTATED RINGERS IV SOLN
INTRAVENOUS | Status: DC | PRN
Start: 2014-11-09 — End: 2014-11-09
  Administered 2014-11-09 (×2): via INTRAVENOUS

## 2014-11-09 MED ORDER — METOCLOPRAMIDE HCL 5 MG/ML IJ SOLN: 5.0000 mg | Freq: Three times a day (TID) | INTRAMUSCULAR | Status: DC | PRN

## 2014-11-09 MED ORDER — PHENOL 1.4 % MT LIQD: 1.0000 | OROMUCOSAL | Status: DC | PRN

## 2014-11-09 MED ORDER — LIDOCAINE HCL (CARDIAC) 20 MG/ML IV SOLN
INTRAVENOUS | Status: DC | PRN
  Administered 2014-11-09: 100 mg via INTRAVENOUS

## 2014-11-09 MED ORDER — HYDROMORPHONE HCL 1 MG/ML IJ SOLN
1.0000 mg | INTRAMUSCULAR | Status: DC | PRN
  Administered 2014-11-10 (×3): 1 mg via INTRAVENOUS
  Filled 2014-11-09 (×4): qty 1

## 2014-11-09 MED ORDER — MAGNESIUM CITRATE PO SOLN: 1.0000 | Freq: Once | ORAL | Status: AC | PRN

## 2014-11-09 MED ORDER — LACTATED RINGERS IV SOLN
INTRAVENOUS | Status: DC
  Administered 2014-11-09: 50 mL/h via INTRAVENOUS

## 2014-11-09 MED ORDER — DIPHENHYDRAMINE HCL 12.5 MG/5ML PO ELIX: 12.5000 mg | ORAL_SOLUTION | ORAL | Status: DC | PRN

## 2014-11-09 MED ORDER — SUCCINYLCHOLINE CHLORIDE 20 MG/ML IJ SOLN
INTRAMUSCULAR | Status: AC
  Filled 2014-11-09: qty 1

## 2014-11-09 MED ORDER — PHENYLEPHRINE 40 MCG/ML (10ML) SYRINGE FOR IV PUSH (FOR BLOOD PRESSURE SUPPORT)
PREFILLED_SYRINGE | INTRAVENOUS | Status: AC
  Filled 2014-11-09: qty 10

## 2014-11-09 MED ORDER — PROMETHAZINE HCL 25 MG/ML IJ SOLN: 6.2500 mg | INTRAMUSCULAR | Status: DC | PRN

## 2014-11-09 MED ORDER — DOCUSATE SODIUM 100 MG PO CAPS
100.0000 mg | ORAL_CAPSULE | Freq: Two times a day (BID) | ORAL | Status: DC
  Administered 2014-11-10 – 2014-11-13 (×7): 100 mg via ORAL
  Filled 2014-11-09 (×8): qty 1

## 2014-11-09 MED ORDER — GENTAMICIN SULFATE 40 MG/ML IJ SOLN
INTRAMUSCULAR | Status: AC
  Filled 2014-11-09: qty 6

## 2014-11-09 MED ORDER — GLYCOPYRROLATE 0.2 MG/ML IJ SOLN
INTRAMUSCULAR | Status: AC
  Filled 2014-11-09: qty 2

## 2014-11-09 MED ORDER — ONDANSETRON HCL 4 MG/2ML IJ SOLN
INTRAMUSCULAR | Status: DC | PRN
  Administered 2014-11-09: 4 mg via INTRAVENOUS

## 2014-11-09 MED ORDER — ALUM & MAG HYDROXIDE-SIMETH 200-200-20 MG/5ML PO SUSP: 30.0000 mL | ORAL | Status: DC | PRN

## 2014-11-09 MED ORDER — SODIUM CHLORIDE 0.9 % IR SOLN
Status: DC | PRN
  Administered 2014-11-09: 3000 mL

## 2014-11-09 MED ORDER — LACTATED RINGERS IV SOLN
INTRAVENOUS | Status: DC | PRN
  Administered 2014-11-09: 17:00:00 via INTRAVENOUS

## 2014-11-09 MED ORDER — HYDROCODONE-ACETAMINOPHEN 10-325 MG PO TABS
1.0000 | ORAL_TABLET | ORAL | Status: DC | PRN
  Administered 2014-11-10 – 2014-11-12 (×5): 1 via ORAL
  Administered 2014-11-12: 2 via ORAL
  Administered 2014-11-13: 1 via ORAL
  Filled 2014-11-09 (×2): qty 1
  Filled 2014-11-09: qty 2
  Filled 2014-11-09: qty 1
  Filled 2014-11-09: qty 2
  Filled 2014-11-09: qty 1
  Filled 2014-11-09: qty 2

## 2014-11-09 MED ORDER — BISACODYL 5 MG PO TBEC: 5.0000 mg | DELAYED_RELEASE_TABLET | Freq: Every day | ORAL | Status: DC | PRN

## 2014-11-09 MED ORDER — METOCLOPRAMIDE HCL 5 MG PO TABS: 5.0000 mg | ORAL_TABLET | Freq: Three times a day (TID) | ORAL | Status: DC | PRN

## 2014-11-09 MED ORDER — VANCOMYCIN HCL 1000 MG IV SOLR
INTRAVENOUS | Status: DC | PRN
  Administered 2014-11-09: 1000 mg

## 2014-11-09 MED ORDER — ACETAMINOPHEN 650 MG RE SUPP: 650.0000 mg | Freq: Four times a day (QID) | RECTAL | Status: DC | PRN

## 2014-11-09 MED ORDER — PROPOFOL 10 MG/ML IV BOLUS
INTRAVENOUS | Status: AC
  Filled 2014-11-09: qty 20

## 2014-11-09 MED ORDER — INSULIN ASPART 100 UNIT/ML ~~LOC~~ SOLN
6.0000 [IU] | Freq: Three times a day (TID) | SUBCUTANEOUS | Status: DC
  Administered 2014-11-12 (×3): 6 [IU] via SUBCUTANEOUS

## 2014-11-09 MED ORDER — ONDANSETRON HCL 4 MG/2ML IJ SOLN: 4.0000 mg | Freq: Four times a day (QID) | INTRAMUSCULAR | Status: DC | PRN

## 2014-11-09 MED ORDER — SODIUM CHLORIDE 0.9 % IJ SOLN
INTRAMUSCULAR | Status: AC
  Filled 2014-11-09: qty 10

## 2014-11-09 MED ORDER — SENNOSIDES-DOCUSATE SODIUM 8.6-50 MG PO TABS: 1.0000 | ORAL_TABLET | Freq: Every evening | ORAL | Status: DC | PRN

## 2014-11-09 MED ORDER — INSULIN DETEMIR 100 UNIT/ML ~~LOC~~ SOLN
45.0000 [IU] | Freq: Every day | SUBCUTANEOUS | Status: DC
  Administered 2014-11-09 – 2014-11-12 (×3): 45 [IU] via SUBCUTANEOUS
  Filled 2014-11-09 (×5): qty 0.45

## 2014-11-09 MED ORDER — HYDROMORPHONE HCL 1 MG/ML IJ SOLN
0.2500 mg | INTRAMUSCULAR | Status: DC | PRN
  Administered 2014-11-09: 0.25 mg via INTRAVENOUS
  Administered 2014-11-09: 0.5 mg via INTRAVENOUS

## 2014-11-09 MED ORDER — METHOCARBAMOL 500 MG PO TABS
500.0000 mg | ORAL_TABLET | Freq: Four times a day (QID) | ORAL | Status: DC | PRN
  Filled 2014-11-09: qty 1

## 2014-11-09 MED ORDER — ROCURONIUM BROMIDE 50 MG/5ML IV SOLN
INTRAVENOUS | Status: AC
  Filled 2014-11-09: qty 1

## 2014-11-09 MED ORDER — FERROUS SULFATE 325 (65 FE) MG PO TABS
325.0000 mg | ORAL_TABLET | Freq: Three times a day (TID) | ORAL | Status: DC
  Administered 2014-11-10 – 2014-11-13 (×10): 325 mg via ORAL
  Filled 2014-11-09 (×9): qty 1

## 2014-11-09 MED ORDER — ARTIFICIAL TEARS OP OINT
TOPICAL_OINTMENT | OPHTHALMIC | Status: AC
  Filled 2014-11-09: qty 3.5

## 2014-11-09 MED ORDER — GENTAMICIN SULFATE 40 MG/ML IJ SOLN
INTRAMUSCULAR | Status: DC | PRN
  Administered 2014-11-09: 240 mg

## 2014-11-09 MED ORDER — ALBUMIN HUMAN 5 % IV SOLN
INTRAVENOUS | Status: DC | PRN
  Administered 2014-11-09: 17:00:00 via INTRAVENOUS

## 2014-11-09 MED ORDER — MUPIROCIN 2 % EX OINT
1.0000 "application " | TOPICAL_OINTMENT | Freq: Once | CUTANEOUS | Status: AC
  Administered 2014-11-09: 1 via TOPICAL

## 2014-11-09 MED ORDER — MENTHOL 3 MG MT LOZG: 1.0000 | LOZENGE | OROMUCOSAL | Status: DC | PRN

## 2014-11-09 MED ORDER — VANCOMYCIN HCL 1000 MG IV SOLR
INTRAVENOUS | Status: AC
  Filled 2014-11-09: qty 1000

## 2014-11-09 MED ORDER — ROCURONIUM BROMIDE 100 MG/10ML IV SOLN
INTRAVENOUS | Status: DC | PRN
  Administered 2014-11-09: 20 mg via INTRAVENOUS
  Administered 2014-11-09: 30 mg via INTRAVENOUS

## 2014-11-09 MED ORDER — HYDROMORPHONE HCL 1 MG/ML IJ SOLN
INTRAMUSCULAR | Status: AC
  Administered 2014-11-09: 0.5 mg via INTRAVENOUS
  Filled 2014-11-09: qty 1

## 2014-11-09 SURGICAL SUPPLY — 53 items
BLADE SAW SGTL MED 73X18.5 STR (BLADE) ×3 IMPLANT
BRUSH FEMORAL CANAL (MISCELLANEOUS) ×3 IMPLANT
CAPT HIP TOTAL 2 ×3 IMPLANT
COVER BACK TABLE 24X17X13 BIG (DRAPES) IMPLANT
COVER SURGICAL LIGHT HANDLE (MISCELLANEOUS) ×3 IMPLANT
DRAPE IMP U-DRAPE 54X76 (DRAPES) ×3 IMPLANT
DRAPE INCISE IOBAN 85X60 (DRAPES) ×6 IMPLANT
DRAPE ORTHO SPLIT 77X108 STRL (DRAPES) ×4
DRAPE SURG ORHT 6 SPLT 77X108 (DRAPES) ×2 IMPLANT
DRAPE U-SHAPE 47X51 STRL (DRAPES) ×3 IMPLANT
DRSG ADAPTIC 3X8 NADH LF (GAUZE/BANDAGES/DRESSINGS) ×3 IMPLANT
DRSG PAD ABDOMINAL 8X10 ST (GAUZE/BANDAGES/DRESSINGS) ×3 IMPLANT
DURAPREP 26ML APPLICATOR (WOUND CARE) ×3 IMPLANT
ELECT BLADE 6.5 EXT (BLADE) ×3 IMPLANT
ELECT CAUTERY BLADE 6.4 (BLADE) ×3 IMPLANT
ELECT REM PT RETURN 9FT ADLT (ELECTROSURGICAL) ×3
ELECTRODE REM PT RTRN 9FT ADLT (ELECTROSURGICAL) ×1 IMPLANT
EVACUATOR 1/8 PVC DRAIN (DRAIN) IMPLANT
GAUZE SPONGE 4X4 12PLY STRL (GAUZE/BANDAGES/DRESSINGS) ×3 IMPLANT
GLOVE BIOGEL PI IND STRL 9 (GLOVE) ×1 IMPLANT
GLOVE BIOGEL PI INDICATOR 9 (GLOVE) ×2
GLOVE SURG ORTHO 9.0 STRL STRW (GLOVE) ×3 IMPLANT
GOWN STRL REUS W/ TWL XL LVL3 (GOWN DISPOSABLE) ×3 IMPLANT
GOWN STRL REUS W/TWL XL LVL3 (GOWN DISPOSABLE) ×6
HANDPIECE INTERPULSE COAX TIP (DISPOSABLE) ×2
IMMOBILIZER KNEE 20 (SOFTGOODS) IMPLANT
IMMOBILIZER KNEE 22 UNIV (SOFTGOODS) ×3 IMPLANT
IMMOBILIZER KNEE 24 THIGH 36 (MISCELLANEOUS) IMPLANT
IMMOBILIZER KNEE 24 UNIV (MISCELLANEOUS)
KIT BASIN OR (CUSTOM PROCEDURE TRAY) ×3 IMPLANT
KIT ROOM TURNOVER OR (KITS) ×3 IMPLANT
KIT STIMULAN RAPID CURE  10CC (Orthopedic Implant) ×2 IMPLANT
KIT STIMULAN RAPID CURE 10CC (Orthopedic Implant) ×1 IMPLANT
MANIFOLD NEPTUNE II (INSTRUMENTS) ×3 IMPLANT
NEEDLE SPNL 18GX3.5 QUINCKE PK (NEEDLE) ×3 IMPLANT
NS IRRIG 1000ML POUR BTL (IV SOLUTION) ×3 IMPLANT
PACK TOTAL JOINT (CUSTOM PROCEDURE TRAY) ×3 IMPLANT
PACK UNIVERSAL I (CUSTOM PROCEDURE TRAY) ×3 IMPLANT
PAD ARMBOARD 7.5X6 YLW CONV (MISCELLANEOUS) ×6 IMPLANT
PRESSURIZER FEMORAL UNIV (MISCELLANEOUS) IMPLANT
SET HNDPC FAN SPRY TIP SCT (DISPOSABLE) ×1 IMPLANT
SPONGE LAP 18X18 X RAY DECT (DISPOSABLE) ×3 IMPLANT
SPONGE LAP 4X18 X RAY DECT (DISPOSABLE) IMPLANT
STAPLER VISISTAT 35W (STAPLE) ×3 IMPLANT
SUCTION FRAZIER TIP 10 FR DISP (SUCTIONS) ×3 IMPLANT
SUT ETHIBOND NAB CT1 #1 30IN (SUTURE) ×6 IMPLANT
SUT VIC AB 1 CTB1 27 (SUTURE) ×6 IMPLANT
SUT VIC AB 2-0 CTB1 (SUTURE) ×6 IMPLANT
TOWEL OR 17X24 6PK STRL BLUE (TOWEL DISPOSABLE) ×3 IMPLANT
TOWEL OR 17X26 10 PK STRL BLUE (TOWEL DISPOSABLE) ×3 IMPLANT
TOWER CARTRIDGE SMART MIX (DISPOSABLE) IMPLANT
TRAY FOLEY CATH 16FRSI W/METER (SET/KITS/TRAYS/PACK) IMPLANT
WATER STERILE IRR 1000ML POUR (IV SOLUTION) ×9 IMPLANT

## 2014-11-09 NOTE — Transfer of Care (Signed)
Immediate Anesthesia Transfer of Care Note  Patient: Kelly Richardson  Procedure(s) Performed: Procedure(s): TOTAL HIP REVISION (Right)  Patient Location: PACU  Anesthesia Type:General  Level of Consciousness: awake, sedated and patient cooperative  Airway & Oxygen Therapy: Patient Spontanous Breathing and Patient connected to face mask oxygen  Post-op Assessment: Report given to RN, Post -op Vital signs reviewed and stable and Patient moving all extremities  Post vital signs: Reviewed and stable  Last Vitals:  Filed Vitals:   11/09/14 1800  BP: 131/62  Pulse: 82  Temp: 36.4 C  Resp: 16    Complications: No apparent anesthesia complications

## 2014-11-09 NOTE — Op Note (Signed)
11/09/2014  5:54 PM  PATIENT:  Kelly Richardson    PRE-OPERATIVE DIAGNOSIS:  S/P infected right hip  POST-OPERATIVE DIAGNOSIS:  Same  PROCEDURE:  TOTAL HIP REVISION  Removal of unipolar arthroplasty antibiotic spacer.  Revision total hip arthroplasty with replacement of the acetabular and femoral component. Placement of antibiotic beads  SURGEON:  Nadara MustardUDA, Groll V, MD  PHYSICIAN ASSISTANT:None ANESTHESIA:   General  PREOPERATIVE INDICATIONS:  Kelly Richardson is a  64 y.o. female with a diagnosis of S/P infected right hip who failed conservative measures and elected for surgical management.    The risks benefits and alternatives were discussed with the patient preoperatively including but not limited to the risks of infection, bleeding, nerve injury, cardiopulmonary complications, the need for revision surgery, among others, and the patient was willing to proceed.  OPERATIVE IMPLANTS: Depew distal porous coated stem size 12. 52 mm acetabulum with a +4 thickness polyethylene with a 36 mm ball. 1.5 mm neck antibody beads with 1 g vancomycin and 240 mg gentamicin  OPERATIVE FINDINGS: Clear fluid no signs of infection no necrotic tissue. Complete destruction of the greater trochanter without reconstruction options.  OPERATIVE PROCEDURE: Patient was brought to the operating room and underwent a general anesthetic. After adequate levels of anesthesia were obtained patient was placed the left lateral decubitus position with the right side up and the right lower extremity was prepped using DuraPrep draped in the sterile field. Collier Flowersoban was used to cover all exposed skin. A timeout was called. A posterior lateral incision was made this was carried down through the tensor fascia lata. There is a large clear serous collection this was sent for cultures. The wound was irrigated with pulsatile lavage. The monopolar hemiarthroplasty antibiotic spacer was removed with the head removed first followed by the  stem. The wound was irrigated with normal saline. The acetabulum was sequentially reamed to a 51 mm for a 52 mm acetabulum. This was inserted 45 of abduction 20 of anteversion. This was secured proximally with a 25 mm screw. A trial liner was placed. The femoral canal was sequentially reamed to 10.5 mm and this was then sequentially broached for the size 12 stem. This was then trialed and the hip was stable good internal and external rotation full abduction with internal rotation of 70 and the hip was stable. The trial implants were removed. The final polyethylene liner was inserted, this was stable. The stem was then inserted with 20 of anteversion. The ball was inserted the hip was reduced again placed through a full range of motion and the hip was stable. The wound was irrigated with pulsatile lavage throughout the case final pulsatile irrigation was performed. The antibiotic beads were packed around the femur and the acetabulum. The tensor fascia lata was closed using #1 Vicryl. Subcutaneous is closed using 2-0 Vicryl. Skin was closed using staples. A sterile dressing was applied. Patient was extubated taken to the PACU in stable condition.

## 2014-11-09 NOTE — H&P (Signed)
Kelly Richardson is an 64 y.o. female.   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 Patient underwent 2 stage reconstruction with placement of antibiotic spacer IV antibiotics for 6 weeks and presents at this time for revision of her total hip arthroplasty.  Past Medical History  Diagnosis Date  . Diabetes mellitus without complication   . Complication of anesthesia   . PONV (postoperative nausea and vomiting)   . Hypertension   . Heart murmur     as a child  . 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 Arthritis   . Shortness of breath dyspnea     sometimes  at night    Past Surgical History  Procedure Laterality Date  . Total hip arthroplasty Right 2011  . Abdominal hysterectomy    . Cholecystectomy  1995  . Tonsillectomy    . Incision and drainage hip Right 06/08/2014    Procedure: Irrigation and Debridement Right Hip, Revision of Cup and Head of Total Hip with  Placement  of Antibiotic Spacer;  Surgeon: Nadara MustardMarcus Derrick Orris V, MD;  Location: [facility] OR;  Service: Orthopedics;  Laterality: Right;  . Excisional total hip arthroplasty with antibiotic spacers Right 09/07/2014    Procedure: Excision Total Hip Arthroplasty, Place Antibiotic Spacer;  Surgeon: Nadara MustardMarcus Haru Shaff V, MD;  Location: [facility] OR;  Service: Orthopedics;  Laterality: Right;  . Colonoscopy      History reviewed. No pertinent family history. Social History:  reports that she has never smoked. She has never used smokeless tobacco. She reports that she does not drink alcohol or use illicit drugs.  Allergies:  Allergies  Allergen Reactions  . Penicillins Anaphylaxis  . Seldane [Terfenadine] Other (See Comments)    N/V, increased heart rate, seizures, hallinations.  . Sulfonamide Derivatives Anaphylaxis  . Other Nausea And Vomiting    "Anything I have ever been given more  nausea just increases it."  . Iohexol      Desc: pt states she had a ct in [place] in 2000 and immediately experienced profuse vomiting and sweating.  She said they treated her like she was having a reaction and doesn't remember much else or if meds were given., Onset Date: [id number]   . Latex Other (See Comments)    BLISTERING  . Metformin Other (See Comments)    REACTION: faintness, DIZZINESS ,  . Percocet [Oxycodone-Acetaminophen] Other (See Comments)    Hallucinations  . Vicks Formula 44 Cough Relief [Dextromethorphan Hbr] Other (See Comments)    Runs blood pressure up   . Xanax [Alprazolam]     Pt does not want Xanax--history of addiction.    No prescriptions prior to admission    No results found for this or any previous visit (from the past 48 hour(s)). No results found.  Review of Systems  All other systems reviewed and are negative.   There were no vitals taken for this visit. Physical Exam  On examination patient is alert oriented there is no drainage no cellulitis no signs of infection. Assessment/Plan Assessment: Antibiotic spacer right hip status post infected total hip arthroplasty.  Plan: We will plan for revision of the total hip arthroplasty. Plan for removal the antibiotic spacer use of a long porous-coated stem risk and benefits were discussed including recurrent infections dislocation need for additional surgery. Patient states she understands wishes to proceed at this  time.  Shirley Bolle V 11/09/2014, 6:32 AM

## 2014-11-09 NOTE — Anesthesia Preprocedure Evaluation (Addendum)
Anesthesia Evaluation  Patient identified by MRN, date of birth, ID band Patient awake    Reviewed: Allergy & Precautions, NPO status , Patient's Chart, lab work & pertinent test results, reviewed documented beta blocker date and time   History of Anesthesia Complications (+) PONV and history of anesthetic complications  Airway Mallampati: II  TM Distance: >3 FB Neck ROM: Full    Dental  (+) Dental Advisory Given   Pulmonary shortness of breath,  breath sounds clear to auscultation        Cardiovascular hypertension, Pt. on medications Rhythm:Regular     Neuro/Psych Seizures -, Well Controlled,  Anxiety    GI/Hepatic negative GI ROS, Neg liver ROS,   Endo/Other  diabetes, Type 2  Renal/GU      Musculoskeletal   Abdominal   Peds  Hematology   Anesthesia Other Findings   Reproductive/Obstetrics                           Anesthesia Physical  Anesthesia Plan  ASA: III  Anesthesia Plan: General   Post-op Pain Management:    Induction: Intravenous  Airway Management Planned: Oral ETT  Additional Equipment:   Intra-op Plan:   Post-operative Plan: Extubation in OR  Informed Consent: I have reviewed the patients History and Physical, chart, labs and discussed the procedure including the risks, benefits and alternatives for the proposed anesthesia with the patient or authorized representative who has indicated his/her understanding and acceptance.   Dental advisory given  Plan Discussed with: CRNA  Anesthesia Plan Comments:        Anesthesia Quick Evaluation

## 2014-11-09 NOTE — Anesthesia Procedure Notes (Signed)
Procedure Name: Intubation Date/Time: 11/09/2014 4:45 PM Performed by: Coralee RudFLORES, Jami Bogdanski Pre-anesthesia Checklist: Patient identified, Emergency Drugs available, Suction available and Patient being monitored Patient Re-evaluated:Patient Re-evaluated prior to inductionOxygen Delivery Method: Circle system utilized Preoxygenation: Pre-oxygenation with 100% oxygen Intubation Type: IV induction Ventilation: Mask ventilation without difficulty Laryngoscope Size: Miller and 3 Grade View: Grade II Tube type: Oral Tube size: 7.0 mm Number of attempts: 1 Airway Equipment and Method: Stylet

## 2014-11-10 LAB — GLUCOSE, CAPILLARY
GLUCOSE-CAPILLARY: 115 mg/dL — AB (ref 70–99)
Glucose-Capillary: 135 mg/dL — ABNORMAL HIGH (ref 70–99)
Glucose-Capillary: 159 mg/dL — ABNORMAL HIGH (ref 70–99)
Glucose-Capillary: 168 mg/dL — ABNORMAL HIGH (ref 70–99)

## 2014-11-10 LAB — CBC
HCT: 23.5 % — ABNORMAL LOW (ref 36.0–46.0)
Hemoglobin: 7.1 g/dL — ABNORMAL LOW (ref 12.0–15.0)
MCH: 22 pg — ABNORMAL LOW (ref 26.0–34.0)
MCHC: 30.2 g/dL (ref 30.0–36.0)
MCV: 72.8 fL — ABNORMAL LOW (ref 78.0–100.0)
Platelets: 226 10*3/uL (ref 150–400)
RBC: 3.23 MIL/uL — ABNORMAL LOW (ref 3.87–5.11)
RDW: 15.8 % — AB (ref 11.5–15.5)
WBC: 9 10*3/uL (ref 4.0–10.5)

## 2014-11-10 LAB — BASIC METABOLIC PANEL
Anion gap: 8 (ref 5–15)
BUN: 15 mg/dL (ref 6–20)
CO2: 25 mmol/L (ref 22–32)
Calcium: 9 mg/dL (ref 8.9–10.3)
Chloride: 104 mmol/L (ref 101–111)
Creatinine, Ser: 0.88 mg/dL (ref 0.44–1.00)
GFR calc Af Amer: >60 mL/min (ref >60)
GLUCOSE: 144 mg/dL — AB (ref 70–99)
Potassium: 4.4 mmol/L (ref 3.5–5.1)
SODIUM: 137 mmol/L (ref 135–145)

## 2014-11-10 LAB — PREPARE RBC (CROSSMATCH)

## 2014-11-10 MED ORDER — SODIUM CHLORIDE 0.9 % IV SOLN
Freq: Once | INTRAVENOUS | Status: AC
  Administered 2014-11-10: 16:00:00 via INTRAVENOUS

## 2014-11-10 NOTE — Progress Notes (Signed)
   Subjective:  Patient reports pain as moderate.  No events.  C/o IV in right wrist.  Objective:   VITALS:   Filed Vitals:   11/09/14 2030 11/09/14 2117 11/10/14 0035 11/10/14 0444  BP:  107/62 84/48 102/46  Pulse: 89 99 85   Temp: 98.4 F (36.9 C) 97.9 F (36.6 C) 98 F (36.7 C) 97.7 F (36.5 C)  TempSrc:  Oral Oral Oral  Resp:  16 16 16   Height:  5\' 1"  (1.549 m)    Weight:  102.1 kg (225 lb 1.4 oz)    SpO2: 100% 95% 90% 96%    Neurologically intact Neurovascular intact Sensation intact distally Intact pulses distally Dorsiflexion/Plantar flexion intact Incision: dressing C/D/I and no drainage No cellulitis present Compartment soft   Lab Results  Component Value Date   WBC 9.0 11/10/2014   HGB 7.1* 11/10/2014   HCT 23.5* 11/10/2014   MCV 72.8* 11/10/2014   PLT 226 11/10/2014     Assessment/Plan:  1 Day Post-Op   - Expected postop acute blood loss anemia - will monitor for symptoms, 2 units RBCs ordered for Hg 7.1 - Up with PT/OT - DVT ppx - SCDs, ambulation, asa - NWB right lower extremity, posterior hip precautions - Pain control - Discharge planning   Cheral AlmasXu, Naiping Michael 11/10/2014, 8:49 AM [phone]

## 2014-11-10 NOTE — Evaluation (Signed)
Physical Therapy Evaluation Patient Details Name: Kelly Richardson J Aydelotte MRN: [id number] DOB: 11/28/1950 Today's Date: 11/10/2014   History of Present Illness  64 y.o. female with a diagnosis of Infected Right Total Hip Arthroplasty; pt s/p Rt hip antibiotic spacer placement 09/07/14. With spacer removal and THA replacement 11/09/14  Clinical Impression  Pt anxious about mobility but benefits from encouragement and reassurance throughout. Pt able to transfer bed to chair with 2 person min assist and reports this is [facility]. Pt will benefit from continued therapy to maximize mobility, function and activity tolerance to increase independence.     Follow Up Recommendations SNF;Supervision/Assistance - 24 hour    Equipment Recommendations  None recommended by PT    Recommendations for Other Services       Precautions / Restrictions Precautions Precautions: Posterior Hip;Fall Restrictions Weight Bearing Restrictions: Yes RLE Weight Bearing: Non weight bearing Other Position/Activity Restrictions: knee immobilizer in room but no order      Mobility  Bed Mobility Overal bed mobility: Needs Assistance;+ 2 for safety/equipment Bed Mobility: Supine to Sit     Supine to sit: Mod assist;+2 for safety/equipment     General bed mobility comments: 2 person assist with cues and pad to pivot to EOB with max cueing for hand placement and not to reach for therapist  Transfers Overall transfer level: Needs assistance   Transfers: Sit to/from Stand;Stand Pivot Transfers Sit to Stand: Min assist;+2 safety/equipment Stand pivot transfers: Min assist;+2 safety/equipment       General transfer comment: Right foot on P.T. foot throughout to maintain NWB and posterior precautions, with assist for elevation from surface, cueing for sequence and pt holding P.T. and tech arms for support to transfer  Ambulation/Gait                Stairs             Wheelchair Mobility    Modified Rankin (Stroke Patients Only)       Balance Overall balance assessment: Needs assistance   Sitting balance-Leahy Scale: Good       Standing balance-Leahy Scale: Poor                               Pertinent Vitals/Pain Pain Assessment: 0-10 Pain Score: 6  Pain Location: right hip Pain Descriptors / Indicators: Aching Pain Intervention(s): Repositioned;Other (comment) (Rn notified of pt status and pain)    Home Living Family/patient expects to be discharged to:: Skilled nursing facility                 Additional Comments: husband works at night and sleeps during the day    Prior Function Level of Independence: Needs assistance   Gait / Transfers Assistance Needed: pt has had 2 person assist with gait belt for stand pivots x 2 months at SNF  ADL's / Homemaking Assistance Needed: 2 person assist to toilet, max asist for bathing, dressing        Hand Dominance        Extremity/Trunk Assessment   Upper Extremity Assessment: Generalized weakness           Lower Extremity Assessment: Generalized weakness         Communication   Communication: No difficulties  Cognition Arousal/Alertness: Awake/alert Behavior During Therapy: Anxious Overall Cognitive Status: Within Functional Limits for tasks assessed  General Comments      Exercises        Assessment/Plan    PT Assessment Patient needs continued PT services  PT Diagnosis Difficulty walking;Generalized weakness;Acute pain   PT Problem List Decreased strength;Decreased range of motion;Decreased activity tolerance;Decreased balance;Pain;Decreased knowledge of use of DME;Decreased mobility  PT Treatment Interventions DME instruction;Functional mobility training;Therapeutic activities;Therapeutic exercise;Patient/family education   PT Goals (Current goals can be found in the Care Plan section) Acute Rehab PT  Goals Patient Stated Goal: [facility] I'm allowed to walk PT Goal Formulation: With patient/family Time For Goal Achievement: 11/24/14 Potential to Achieve Goals: Fair    Frequency Min 3X/week   Barriers to discharge        Co-evaluation               End of Session Equipment Utilized During Treatment: Gait belt Activity Tolerance: Patient limited by pain Patient left: in chair;with call bell/phone within reach;with family/visitor present Nurse Communication: Mobility status;Precautions         Time: 1204-1223 PT Time Calculation (min) (ACUTE ONLY): 19 min   Charges:   PT Evaluation $Initial PT Evaluation Tier I: 1 Procedure     PT G CodesDelorse Lek:        Tabor, Vienne Corcoran Beth 11/10/2014, 12:38 PM Delaney MeigsMaija Tabor Alexi Geibel, PT [phone]

## 2014-11-10 NOTE — Progress Notes (Signed)
OT Cancellation Note  Patient Details Name: Kelly Richardson MRN: [id number] DOB: 31-Aug-1950   Cancelled Treatment:    Reason Eval/Treat Not Completed: OT screened. Pt is Medicare and current D/C plan is SNF. No apparent immediate acute care OT needs, therefore will defer OT to SNF. If OT eval is needed please call Acute Rehab Dept. at [phone] or text page OT at [phone].    Earlie RavelingStraub, Ader Fritze L OTR/L [phone] 11/10/2014, 12:52 PM

## 2014-11-10 NOTE — Anesthesia Postprocedure Evaluation (Signed)
  Anesthesia Post-op Note  Patient: Kelly Richardson  Procedure(s) Performed: Procedure(s): TOTAL HIP REVISION (Right)  Patient Location: PACU  Anesthesia Type:General  Level of Consciousness: awake  Airway and Oxygen Therapy: Patient Spontanous Breathing  Post-op Pain: mild  Post-op Assessment: Post-op Vital signs reviewed, Patient's Cardiovascular Status Stable, Respiratory Function Stable, Patent Airway, No signs of Nausea or vomiting and Pain level controlled  Post-op Vital Signs: Reviewed and stable  Last Vitals:  Filed Vitals:   11/10/14 1541  BP: 94/56  Pulse: 93  Temp: 36.7 C  Resp: 18    Complications: No apparent anesthesia complications

## 2014-11-11 LAB — GLUCOSE, CAPILLARY
GLUCOSE-CAPILLARY: 130 mg/dL — AB (ref 70–99)
Glucose-Capillary: 126 mg/dL — ABNORMAL HIGH (ref 70–99)
Glucose-Capillary: 107 mg/dL — ABNORMAL HIGH (ref 70–99)
Glucose-Capillary: 125 mg/dL — ABNORMAL HIGH (ref 70–99)

## 2014-11-11 LAB — TYPE AND SCREEN
ABO/RH(D): A POS
Antibody Screen: NEGATIVE
UNIT DIVISION: 0
Unit division: 0

## 2014-11-11 LAB — BASIC METABOLIC PANEL
Anion gap: 9 (ref 5–15)
BUN: 15 mg/dL (ref 6–20)
CALCIUM: 8.9 mg/dL (ref 8.9–10.3)
CO2: 22 mmol/L (ref 22–32)
Chloride: 104 mmol/L (ref 101–111)
Creatinine, Ser: 0.82 mg/dL (ref 0.44–1.00)
GFR calc Af Amer: >60 mL/min (ref >60)
GFR calc non Af Amer: >60 mL/min (ref >60)
Glucose, Bld: 141 mg/dL — ABNORMAL HIGH (ref 70–99)
Potassium: 3.4 mmol/L — ABNORMAL LOW (ref 3.5–5.1)
SODIUM: 135 mmol/L (ref 135–145)

## 2014-11-11 LAB — CBC
HCT: 28.3 % — ABNORMAL LOW (ref 36.0–46.0)
HEMOGLOBIN: 9 g/dL — AB (ref 12.0–15.0)
MCH: 23.9 pg — AB (ref 26.0–34.0)
MCHC: 31.8 g/dL (ref 30.0–36.0)
MCV: 75.3 fL — ABNORMAL LOW (ref 78.0–100.0)
Platelets: 191 10*3/uL (ref 150–400)
RBC: 3.76 MIL/uL — AB (ref 3.87–5.11)
RDW: 16.5 % — AB (ref 11.5–15.5)
WBC: 8.9 10*3/uL (ref 4.0–10.5)

## 2014-11-11 LAB — HEMOGLOBIN AND HEMATOCRIT, BLOOD
HCT: 28 % — ABNORMAL LOW (ref 36.0–46.0)
Hemoglobin: 9 g/dL — ABNORMAL LOW (ref 12.0–15.0)

## 2014-11-11 NOTE — Clinical Social Work Note (Signed)
Clinical Social Work Assessment  Patient Details  Name: Kelly Richardson MRN: [id number] Date of Birth: 1951/05/26  Date of referral:  11/11/14               Reason for consult:  Facility Placement (Return to SNF)                Permission sought to share information with:  Facility Medical sales representativeContact Representative, Family Supports Permission granted to share information::  Yes, Verbal Permission Granted  Name::     [facility] and her boyfirend- Waverly FerrariRick Webb           Housing/Transportation Living arrangements for the past 2 months:  Location managerost-Acute Facility Source of Information:  Patient Patient Interpreter Needed:  None Criminal Activity/Legal Involvement Pertinent to Current Situation/Hospitalization:  No - Comment as needed Significant Relationships:  Significant Other (States no family or other friends- only Boyfriend Armed forces training and education officerick) Lives with:    Do you feel safe going back to the place where you live?  Yes Need for family participation in patient care:  No (Coment) (No family. Patient is alert and oriented X 4)  Care giving concerns:  [facility] with intent to return there when medically stable.  Has a supportive boyfriend but states he cannot help her at home at this time.  Social Worker assessment / plan:  64 year old female- [facility]. She is alert, oriented all spheres and was appreciative of CSW's visit. She was noted to be somewhat frustrated with her current care at the SNF- however, she is adamant that she wants to return there for continued care. She feels that they are providing "the best care possible for her."  She states that she has no other family or friends to help her at home. Fl2 completed and and placed on chart after MD's signed form.  Message left for Rhonda- [facility]. Per Dr. Roda ShuttersXu- anticipates d/c back to SNF tomorrow. Patient is aware and will let her boyfriend know. She states it is ok to talk to him but  she will keep him notified today of d/c plan.  Employment status:  Disabled (Comment on whether or not currently receiving Disability) Insurance information:  [place] Recommendations:   SNF Information / Referral to community resources:  Skilled Nursing Facility  Patient/Family's Response to care: Patient states she is happy with her care at this hospital and stated "they have taken good care of me." She denies any current concerns.  Patient/Family's Understanding of and Emotional Response to Diagnosis, Current Treatment, and Prognosis:  Patient is able to discuss her current medical condition and states she is aware of why she is in the hospital.  She verbalizes a good understanding of the need for rehab for recovery as well as her current medical treatments. She denies any current concerns.    Emotional Assessment Appearance:  Appears older than stated age Attitude/Demeanor/Rapport:  Complaining, Inconsistent Affect (typically observed):  Calm, Anxious, Irritable Orientation:  Oriented to Self, Oriented to Place, Oriented to  Time, Oriented to Situation Alcohol / Substance use:  Never Used Psych involvement (Current and /or in the community):  No (Comment)  Discharge Needs  Concerns to be addressed:  Other (Comment Required (Assistance with return to SNF) Readmission within the last 30 days:  No Current discharge risk:  None Barriers to Discharge:  No Barriers Identified   Darylene PriceCrowder, Tennie Grussing T, LCSW 11/11/2014, 4:34 PM  [phone]  (weekend  coverage)

## 2014-11-11 NOTE — Progress Notes (Signed)
Pain controlled.  Did well with PT yesterday - recommending SNF, [facility].  Responded well to transfusion.  Continue PT.  Mayra ReelN. Michael Xu, MD [facility] [phone] 9:06 AM

## 2014-11-12 ENCOUNTER — Encounter (HOSPITAL_COMMUNITY): Payer: Self-pay | Admitting: Orthopedic Surgery

## 2014-11-12 LAB — GLUCOSE, CAPILLARY
GLUCOSE-CAPILLARY: 136 mg/dL — AB (ref 70–99)
Glucose-Capillary: 158 mg/dL — ABNORMAL HIGH (ref 70–99)
Glucose-Capillary: 85 mg/dL (ref 70–99)
Glucose-Capillary: 130 mg/dL — ABNORMAL HIGH (ref 70–99)

## 2014-11-12 LAB — BASIC METABOLIC PANEL
Anion gap: 9 (ref 5–15)
BUN: 11 mg/dL (ref 6–20)
CALCIUM: 8.9 mg/dL (ref 8.9–10.3)
CHLORIDE: 106 mmol/L (ref 101–111)
CO2: 21 mmol/L — ABNORMAL LOW (ref 22–32)
CREATININE: 0.82 mg/dL (ref 0.44–1.00)
GFR calc Af Amer: >60 mL/min (ref >60)
GFR calc non Af Amer: >60 mL/min (ref >60)
Glucose, Bld: 150 mg/dL — ABNORMAL HIGH (ref 70–99)
POTASSIUM: 3.2 mmol/L — AB (ref 3.5–5.1)
Sodium: 136 mmol/L (ref 135–145)

## 2014-11-12 LAB — CBC
HEMATOCRIT: 28 % — AB (ref 36.0–46.0)
Hemoglobin: 8.9 g/dL — ABNORMAL LOW (ref 12.0–15.0)
MCH: 24.1 pg — ABNORMAL LOW (ref 26.0–34.0)
MCHC: 31.8 g/dL (ref 30.0–36.0)
MCV: 75.9 fL — ABNORMAL LOW (ref 78.0–100.0)
PLATELETS: 189 10*3/uL (ref 150–400)
RBC: 3.69 MIL/uL — ABNORMAL LOW (ref 3.87–5.11)
RDW: 17.1 % — AB (ref 11.5–15.5)
WBC: 8.3 10*3/uL (ref 4.0–10.5)

## 2014-11-12 NOTE — Progress Notes (Signed)
Patient ID: Kelly Richardson, female   DOB: 01-Jul-1951, 64 y.o.   MRN: [id number] Postoperative day 3 revision right total hip arthroplasty. Patient is comfortable this morning. Plan for discharge to rehabilitation. F L2 is complete. Cultures are negative.

## 2014-11-12 NOTE — Progress Notes (Signed)
Physical Therapy Treatment Patient Details Name: Kelly Richardson MRN: [id number] DOB: 29-Dec-1950 Today's Date: 11/12/2014    History of Present Illness 64 y.o. female with a diagnosis of Infected Right Total Hip Arthroplasty; pt s/p Rt hip antibiotic spacer placement 09/07/14. With spacer removal and THA replacement 11/09/14    PT Comments    Patient progressing well although somewhat limited by anticipation of pain. Good awareness of NWBing this session. Encouraged activity and sitting up throughout the day. Continue to recommend SNF for ongoing Physical Therapy.     Follow Up Recommendations  SNF;Supervision/Assistance - 24 hour     Equipment Recommendations  None recommended by PT    Recommendations for Other Services       Precautions / Restrictions Precautions Precautions: Posterior Hip;Fall Restrictions RLE Weight Bearing: Non weight bearing    Mobility  Bed Mobility   Bed Mobility: Supine to Sit     Supine to sit: Mod assist     General bed mobility comments: Mod A and use of pad to pivot hips to EOB. Patient able to use railings to sit upright. MinA  for R LE throughout  Transfers Overall transfer level: Needs assistance     Sit to Stand: Min guard Stand pivot transfers: Min assist       General transfer comment: MG for standing with cues for safe technique and positioning. Min A for steading RW. Patient with good awareness and ability to maintain NWB on R leg this session.   Ambulation/Gait         Gait velocity: unable       Stairs            Wheelchair Mobility    Modified Rankin (Stroke Patients Only)       Balance                                    Cognition Arousal/Alertness: Awake/alert Behavior During Therapy: WFL for tasks assessed/performed Overall Cognitive Status: Within Functional Limits for tasks assessed                      Exercises      General Comments        Pertinent  Vitals/Pain Pain Score: 7  Pain Location: R hip Pain Descriptors / Indicators: Aching;Sore Pain Intervention(s): Monitored during session;Repositioned    Home Living                      Prior Function            PT Goals (current goals can now be found in the care plan section) Progress towards PT goals: Progressing toward goals    Frequency  Min 3X/week    PT Plan Current plan remains appropriate    Co-evaluation             End of Session Equipment Utilized During Treatment: Gait belt Activity Tolerance: Patient tolerated treatment well Patient left: in chair;with call bell/phone within reach     Time: 1430-1449 PT Time Calculation (min) (ACUTE ONLY): 19 min  Charges:  $Therapeutic Activity: 8-22 mins                    G Codes:      Kelly Richardson, Kelly Richardson 11/12/2014, 3:24 PM 11/12/2014 Kelly Richardson, Kelly Richardson PTA [phone] pager [phone] office

## 2014-11-13 ENCOUNTER — Emergency Department (HOSPITAL_COMMUNITY)
Admission: EM | Admit: 2014-11-13 | Discharge: 2014-11-14 | Disposition: A | Discharge status: Home / Self Care | Payer: Medicare Other | Attending: Emergency Medicine | Admitting: Emergency Medicine

## 2014-11-13 ENCOUNTER — Encounter (HOSPITAL_COMMUNITY): Payer: Self-pay | Admitting: *Deleted

## 2014-11-13 DIAGNOSIS — Z022 Encounter for examination for admission to residential institution: Secondary | ICD-10-CM

## 2014-11-13 DIAGNOSIS — Z794 Long term (current) use of insulin: Secondary | ICD-10-CM | POA: Insufficient documentation

## 2014-11-13 DIAGNOSIS — R011 Cardiac murmur, unspecified: Secondary | ICD-10-CM | POA: Insufficient documentation

## 2014-11-13 DIAGNOSIS — Z9889 Other specified postprocedural states: Secondary | ICD-10-CM | POA: Diagnosis not present

## 2014-11-13 DIAGNOSIS — M1990 Unspecified osteoarthritis, unspecified site: Secondary | ICD-10-CM | POA: Insufficient documentation

## 2014-11-13 DIAGNOSIS — Z880 Allergy status to penicillin: Secondary | ICD-10-CM | POA: Insufficient documentation

## 2014-11-13 DIAGNOSIS — R269 Unspecified abnormalities of gait and mobility: Secondary | ICD-10-CM | POA: Diagnosis not present

## 2014-11-13 DIAGNOSIS — Z7982 Long term (current) use of aspirin: Secondary | ICD-10-CM | POA: Insufficient documentation

## 2014-11-13 DIAGNOSIS — E119 Type 2 diabetes mellitus without complications: Secondary | ICD-10-CM | POA: Insufficient documentation

## 2014-11-13 DIAGNOSIS — I10 Essential (primary) hypertension: Secondary | ICD-10-CM | POA: Insufficient documentation

## 2014-11-13 DIAGNOSIS — Z96641 Presence of right artificial hip joint: Secondary | ICD-10-CM | POA: Insufficient documentation

## 2014-11-13 DIAGNOSIS — Z0000 Encounter for general adult medical examination without abnormal findings: Secondary | ICD-10-CM | POA: Diagnosis not present

## 2014-11-13 DIAGNOSIS — Z91040 Latex allergy status: Secondary | ICD-10-CM | POA: Insufficient documentation

## 2014-11-13 LAB — BODY FLUID CULTURE: CULTURE: NO GROWTH

## 2014-11-13 LAB — GLUCOSE, CAPILLARY
Glucose-Capillary: 80 mg/dL (ref 70–99)
Glucose-Capillary: 101 mg/dL — ABNORMAL HIGH (ref 70–99)

## 2014-11-13 LAB — CBG MONITORING, ED: GLUCOSE-CAPILLARY: 151 mg/dL — AB (ref 70–99)

## 2014-11-13 MED ORDER — INSULIN DETEMIR 100 UNIT/ML ~~LOC~~ SOLN
45.0000 [IU] | Freq: Every day | SUBCUTANEOUS | Status: DC
  Administered 2014-11-13: 45 [IU] via SUBCUTANEOUS
  Filled 2014-11-13 (×2): qty 0.45

## 2014-11-13 MED ORDER — INSULIN DETEMIR 100 UNIT/ML FLEXPEN: 45.0000 [IU] | PEN_INJECTOR | Freq: Every day | SUBCUTANEOUS | Status: DC

## 2014-11-13 MED ORDER — ASPIRIN EC 81 MG PO TBEC: 81.0000 mg | DELAYED_RELEASE_TABLET | Freq: Every day | ORAL | Status: DC

## 2014-11-13 MED ORDER — HYDROCODONE-ACETAMINOPHEN 10-325 MG PO TABS
1.0000 | ORAL_TABLET | ORAL | Status: DC | PRN
  Administered 2014-11-13: 1 via ORAL
  Filled 2014-11-13: qty 1

## 2014-11-13 MED ORDER — ASPIRIN EC 325 MG PO TBEC
325.0000 mg | DELAYED_RELEASE_TABLET | Freq: Every day | ORAL | Status: DC
  Administered 2014-11-13 – 2014-11-14 (×2): 325 mg via ORAL
  Filled 2014-11-13 (×2): qty 1

## 2014-11-13 MED ORDER — INSULIN ASPART 100 UNIT/ML ~~LOC~~ SOLN: 0.0000 [IU] | Freq: Three times a day (TID) | SUBCUTANEOUS | Status: DC

## 2014-11-13 MED ORDER — HYDROCODONE-ACETAMINOPHEN 10-325 MG PO TABS: 1.0000 | ORAL_TABLET | ORAL | Status: DC | PRN

## 2014-11-13 MED ORDER — ACETAMINOPHEN 325 MG PO TABS
650.0000 mg | ORAL_TABLET | Freq: Four times a day (QID) | ORAL | Status: DC | PRN
  Administered 2014-11-14: 325 mg via ORAL

## 2014-11-13 MED ORDER — ASPIRIN EC 325 MG PO TBEC: 325.0000 mg | DELAYED_RELEASE_TABLET | Freq: Every day | ORAL | Status: DC

## 2014-11-13 NOTE — ED Notes (Signed)
Heart healthy, carb modified dinner tray ordered

## 2014-11-13 NOTE — Discharge Summary (Signed)
Physician Discharge Summary  Patient ID: Kelly Richardson MRN: [id number] DOB/AGE: 62-Dec-1952 62 y.o.  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  Discharge Diagnoses:  Active Problems:   H/O total hip arthroplasty   Discharged Condition: stable  Hospital Course: Patient's hospital course was essentially unremarkable. She underwent removal of the hemiarthroplasty antibiotic spacer. She underwent revision of both the acetabular and femoral component with placement of antibiotic beads. Postoperatively patient progressed slowly with therapy and was discharged to skilled nursing.  Consults: None  Significant Diagnostic Studies: labs: Routine labs  Treatments: surgery: See operative note  Discharge Exam: Blood pressure 102/58, pulse 76, temperature 97.9 F (36.6 C), temperature source Oral, resp. rate 16, height 5\' 1"  (1.549 m), weight 102.1 kg (225 lb 1.4 oz), SpO2 97 %. Incision/Wound: dressing clean and dry  Disposition: 03-Skilled Nursing Facility  Discharge Instructions    Call MD / Call 911    Complete by:  As directed   If you experience chest pain or shortness of breath, CALL 911 and be transported to the hospital emergency room.  If you develope a fever above 101 F, pus (white drainage) or increased drainage or redness at the wound, or calf pain, call your surgeon's office.     Constipation Prevention    Complete by:  As directed   Drink plenty of fluids.  Prune juice may be helpful.  You may use a stool softener, such as Colace (over the counter) 100 mg twice a day.  Use MiraLax (over the counter) for constipation as needed.     Diet - low sodium heart healthy    Complete by:  As directed      Increase activity slowly as tolerated    Complete by:  As directed      Weight bearing as tolerated    Complete by:  As directed             Medication  List    STOP taking these medications        oxyCODONE-acetaminophen 5-325 MG per tablet  Commonly known as:  ROXICET      TAKE these medications        acetaminophen 325 MG tablet  Commonly known as:  TYLENOL  Take 650 mg by mouth every 6 (six) hours as needed.     aspirin EC 81 MG tablet  Take 81 mg by mouth daily.     aspirin EC 325 MG tablet  Take 1 tablet (325 mg total) by mouth daily.     HYDROcodone-acetaminophen 10-325 MG per tablet  Commonly known as:  NORCO  Take 1-2 tablets by mouth every 4 (four) hours as needed for moderate pain (breakthrough pain).     insulin aspart 100 UNIT/ML injection  Commonly known as:  novoLOG  Inject 0-8 Units into the skin 3 (three) times daily before meals. If blood sugar over 185     LEVEMIR FLEXTOUCH 100 UNIT/ML Pen  Generic drug:  Insulin Detemir  Inject 45 Units into the skin at bedtime.           Follow-up Information    Follow up with Lior Cartelli V, MD In 2 weeks.   Specialty:  Orthopedic Surgery   Contact information:   [address] [place] [address] [phone]       Signed: [name],Kelly Richardson 11/13/2014, 6:17 AM

## 2014-11-13 NOTE — ED Provider Notes (Signed)
CSN: [id number]     Arrival date & time 11/13/14  1439 History   First MD Initiated Contact with Patient 11/13/14 1514     Chief Complaint  Patient presents with  . Hip Pain   chief complaint here for placement in skilled nursing facility   (Consider location/radiation/quality/duration/timing/severity/associated sxs/prior Treatment) Patient is a 64 y.o. female presenting with hip pain.  Hip Pain   Patient left the hospital today after right hip surgery. She was taken to a semiprivate room which she refused. She was brought back here for further bed placement in skilled nursing facility. She offers no complaint presently. [facility] where she spent time as inpatient Past Medical History  Diagnosis Date  . Diabetes mellitus without complication   . Complication of anesthesia   . PONV (postoperative nausea and vomiting)   . Hypertension   . Heart murmur     as a child  . 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 Arthritis   . Shortness of breath dyspnea     sometimes  at night   Past Surgical History  Procedure Laterality Date  . Total hip arthroplasty Right 2011  . Abdominal hysterectomy    . Cholecystectomy  1995  . Tonsillectomy    . Incision and drainage hip Right 06/08/2014    Procedure: Irrigation and Debridement Right Hip, Revision of Cup and Head of Total Hip with  Placement  of Antibiotic Spacer;  Surgeon: Nadara MustardMarcus Duda V, MD;  Location: [facility] OR;  Service: Orthopedics;  Laterality: Right;  . Excisional total hip arthroplasty with antibiotic spacers Right 09/07/2014    Procedure: Excision Total Hip Arthroplasty, Place Antibiotic Spacer;  Surgeon: Nadara MustardMarcus Duda V, MD;  Location: [facility] OR;  Service: Orthopedics;  Laterality: Right;  . Colonoscopy    . Total hip revision Right 11/09/2014    Procedure: TOTAL HIP REVISION;  Surgeon: Nadara MustardMarcus Duda V, MD;  Location: [facility] OR;  Service: Orthopedics;  Laterality: Right;   No family history on  file. History  Substance Use Topics  . Smoking status: Never Smoker   . Smokeless tobacco: Never Used  . Alcohol Use: No   OB History    No data available     Review of Systems  Constitutional: Negative.   Musculoskeletal: Positive for gait problem.       Bedbound      Allergies  Penicillins; Seldane; Sulfonamide derivatives; Other; Iohexol; Latex; Metformin; Percocet; Vicks formula 44 cough relief; and Xanax  Home Medications   Prior to Admission medications   Medication Sig Start Date End Date Taking? Authorizing Provider  acetaminophen (TYLENOL) 325 MG tablet Take 650 mg by mouth every 6 (six) hours as needed.    Historical Provider, MD  aspirin EC 325 MG tablet Take 1 tablet (325 mg total) by mouth daily. 11/13/14   Nadara MustardMarcus Duda V, MD  aspirin EC 81 MG tablet Take 81 mg by mouth daily.    Historical Provider, MD  HYDROcodone-acetaminophen (NORCO) 10-325 MG per tablet Take 1-2 tablets by mouth every 4 (four) hours as needed for moderate pain (breakthrough pain). 11/13/14   Nadara MustardMarcus Duda V, MD  insulin aspart (NOVOLOG) 100 UNIT/ML injection Inject 0-8 Units into the skin 3 (three) times daily before meals. If blood sugar over 185    Historical Provider, MD  LEVEMIR FLEXTOUCH 100 UNIT/ML SOPN Inject 45 Units into the skin at bedtime.  03/02/13   Historical Provider, MD  BP 115/67 mmHg  Pulse 87  Temp(Src) 98.3 F (36.8 C) (Oral)  Resp 16  SpO2 96% Physical Exam  Constitutional: She appears well-developed and well-nourished.  HENT:  Head: Normocephalic and atraumatic.  Eyes: Conjunctivae are normal. Pupils are equal, round, and reactive to light.  Neck: Neck supple. No tracheal deviation present. No thyromegaly present.  Cardiovascular: Normal rate and regular rhythm.   No murmur heard. Pulmonary/Chest: Effort normal and breath sounds normal.  Abdominal: Soft. Bowel sounds are normal. She exhibits no distension. There is no tenderness.  Musculoskeletal: She exhibits no  edema or tenderness.  Banded surgical wound at right hip. Nontender. DP pulses 2+ bilaterally. Full range of motion of left lower extremity and bilateral upper extremities. Limited range of motion of right lower extremity  Neurological: She is alert. Coordination normal.  Skin: Skin is warm and dry. No rash noted.  Psychiatric: She has a normal mood and affect.  Nursing note and vitals reviewed.   ED Course  Procedures (including critical care time) Labs Review Labs Reviewed - No data to display  Imaging Review No results found.   EKG Interpretation None     Case management consulted for placement . MDM  [facility] She will be held in the emergency department overnight. Final diagnoses:  None        Doug SouSam Yardley Beltran, MD 11/14/14 0000

## 2014-11-13 NOTE — Discharge Instructions (Signed)
Go to the skilled nursing facility as directed for further care

## 2014-11-13 NOTE — ED Notes (Signed)
Pt in via [facility], pt comes [facility], per report pt was transported [facility] for [facility] post her 3rd hip sx, pt refused placement at the facility d/t semi private room, pt presents back to ED, pt A&O x4

## 2014-11-13 NOTE — ED Notes (Signed)
Called Pharmacy about patient's insulin.

## 2014-11-13 NOTE — ED Notes (Signed)
CBG=[id number]  

## 2014-11-13 NOTE — ED Notes (Signed)
[facility]

## 2014-11-13 NOTE — ED Notes (Cosign Needed)
CSW Intern provided bed offers. Patient stated will make decision tomorrow.

## 2014-11-13 NOTE — Care Management Note (Signed)
Case Management Note  Patient Details  Name: Kelly Richardson MRN: [id number] Date of Birth: 1951/05/18  Subjective/Objective:        Patient admitted for a right total hip revision with antibiotic spacers            Action/Plan:                          Expected Discharge Date:11/13/14                  Expected Discharge Plan:   [facility]  In-House Referral:  Clinical Social Work  Discharge planning Services  CM Consult  Post Acute Care Choice:    Choice offered to:     DME Arranged:    DME Agency:     HH Arranged:    HH Agency:     Status of Service:  Completed, signed off  Medicare Important Message Given:    Date Medicare IM Given:    Medicare IM give by:    Date Additional Medicare IM Given:    Additional Medicare Important Message give by:     If discussed at Long Length of Stay Meetings, dates discussed:    Additional Comments:  Durenda GuthrieBrady, Devera Englander Naomi, RN 11/13/2014, 3:13 PM

## 2014-11-13 NOTE — Progress Notes (Signed)
Pt refusing to return to [facility] and unable to return home until she is more mobile.  CSW has initiated bed search.  Pt understands that she will have to accept the first available offer.  Pt is also aware that she will have to return [facility] with [facility] and family support if no beds are secured and continues to refuse [facility].  Await bed offers.

## 2014-11-13 NOTE — Discharge Planning (Signed)
[facility] to [facility]. Patient updated at bedside.  [facility] Report number: [phone] Transportation: EMS ([facility])  Marcelline DeistEmily Stevens [place], [place] Cell: [phone]       Fax: [phone] Clinical Social Work: Orthopedics [phone]([id number]) and Surgical [phone]([id number])

## 2014-11-13 NOTE — Progress Notes (Signed)
Pt d/c'd via [facility] transportation with her belongings and packet of information.

## 2014-11-14 DIAGNOSIS — M7989 Other specified soft tissue disorders: Secondary | ICD-10-CM | POA: Diagnosis not present

## 2014-11-14 DIAGNOSIS — R609 Edema, unspecified: Secondary | ICD-10-CM | POA: Diagnosis not present

## 2014-11-14 DIAGNOSIS — T8451XD Infection and inflammatory reaction due to internal right hip prosthesis, subsequent encounter: Secondary | ICD-10-CM | POA: Diagnosis not present

## 2014-11-14 DIAGNOSIS — Z9889 Other specified postprocedural states: Secondary | ICD-10-CM | POA: Diagnosis not present

## 2014-11-14 DIAGNOSIS — T8189XA Other complications of procedures, not elsewhere classified, initial encounter: Secondary | ICD-10-CM | POA: Diagnosis not present

## 2014-11-14 DIAGNOSIS — T849XXA Unspecified complication of internal orthopedic prosthetic device, implant and graft, initial encounter: Secondary | ICD-10-CM | POA: Diagnosis not present

## 2014-11-14 DIAGNOSIS — I10 Essential (primary) hypertension: Secondary | ICD-10-CM | POA: Diagnosis not present

## 2014-11-14 DIAGNOSIS — Z91040 Latex allergy status: Secondary | ICD-10-CM | POA: Diagnosis not present

## 2014-11-14 DIAGNOSIS — E119 Type 2 diabetes mellitus without complications: Secondary | ICD-10-CM | POA: Diagnosis not present

## 2014-11-14 DIAGNOSIS — M25551 Pain in right hip: Secondary | ICD-10-CM | POA: Diagnosis not present

## 2014-11-14 DIAGNOSIS — E139 Other specified diabetes mellitus without complications: Secondary | ICD-10-CM | POA: Diagnosis not present

## 2014-11-14 DIAGNOSIS — M79662 Pain in left lower leg: Secondary | ICD-10-CM | POA: Diagnosis not present

## 2014-11-14 DIAGNOSIS — Z794 Long term (current) use of insulin: Secondary | ICD-10-CM | POA: Diagnosis not present

## 2014-11-14 DIAGNOSIS — M129 Arthropathy, unspecified: Secondary | ICD-10-CM | POA: Diagnosis not present

## 2014-11-14 DIAGNOSIS — Z7982 Long term (current) use of aspirin: Secondary | ICD-10-CM | POA: Diagnosis not present

## 2014-11-14 DIAGNOSIS — Z8669 Personal history of other diseases of the nervous system and sense organs: Secondary | ICD-10-CM | POA: Diagnosis not present

## 2014-11-14 DIAGNOSIS — M1611 Unilateral primary osteoarthritis, right hip: Secondary | ICD-10-CM | POA: Diagnosis not present

## 2014-11-14 DIAGNOSIS — R011 Cardiac murmur, unspecified: Secondary | ICD-10-CM | POA: Diagnosis not present

## 2014-11-14 DIAGNOSIS — T8451XA Infection and inflammatory reaction due to internal right hip prosthesis, initial encounter: Secondary | ICD-10-CM | POA: Diagnosis not present

## 2014-11-14 DIAGNOSIS — Z9660 Presence of unspecified orthopedic joint implant: Secondary | ICD-10-CM | POA: Diagnosis not present

## 2014-11-14 DIAGNOSIS — Z880 Allergy status to penicillin: Secondary | ICD-10-CM | POA: Diagnosis not present

## 2014-11-14 DIAGNOSIS — Z0000 Encounter for general adult medical examination without abnormal findings: Secondary | ICD-10-CM | POA: Diagnosis not present

## 2014-11-14 DIAGNOSIS — M869 Osteomyelitis, unspecified: Secondary | ICD-10-CM | POA: Diagnosis not present

## 2014-11-14 DIAGNOSIS — M1990 Unspecified osteoarthritis, unspecified site: Secondary | ICD-10-CM | POA: Diagnosis not present

## 2014-11-14 DIAGNOSIS — R2241 Localized swelling, mass and lump, right lower limb: Secondary | ICD-10-CM | POA: Diagnosis not present

## 2014-11-14 LAB — ANAEROBIC CULTURE

## 2014-11-14 LAB — CBG MONITORING, ED
Glucose-Capillary: 114 mg/dL — ABNORMAL HIGH (ref 70–99)
Glucose-Capillary: 102 mg/dL — ABNORMAL HIGH (ref 70–99)

## 2014-11-14 NOTE — Clinical Social Work Placement (Signed)
   CLINICAL SOCIAL WORK PLACEMENT  NOTE  Date:  11/14/2014  Patient Details  Name: Kelly Richardson MRN: [id number] Date of Birth: 11/21/1950  Clinical Social Work is seeking post-discharge placement for this patient at the Skilled  Nursing Facility level of care (*CSW will initial, date and re-position this form in  chart as items are completed):  Yes   Patient/family provided with [facility] Clinical Social Work Department's list of facilities offering this level of care within the geographic area requested by the patient (or if unable, by the patient's family).  Yes   Patient/family informed of their freedom to choose among providers that offer the needed level of care, that participate in Medicare, Medicaid or managed care program needed by the patient, have an available bed and are willing to accept the patient.  Yes   Patient/family informed of [facility]'s ownership interest in [facility] and [facility], as well as of the fact that they are under no obligation to receive care at these facilities.  PASRR submitted to EDS on       PASRR number received on       Existing PASRR number confirmed on 11/14/14     FL2 transmitted to all facilities in geographic area requested by pt/family on 11/14/14     FL2 transmitted to all facilities within larger geographic area on       Patient informed that his/her managed care company has contracts with or will negotiate with certain facilities, including the following:        Yes   Patient/family informed of bed offers received.  [facility]     Physician recommends and patient chooses bed [facility]    [facility] on 11/14/14.  Patient to be transferred to facility by EMS: [facility]     Patient family notified on 11/14/14 of transfer.  Name of family member notified:  Patient to notify her boyfriend that she is  going to SNF     PHYSICIAN Please sign FL2     Additional Comment:  Patient is a recent discharge/readmit back to hospital from [facility].  Patient was upset she was being placed in a semi-private room rather than her private room, thus had EMS [facility].  Patient does not want to [facility].  [facility].  [facility] agreeable to take patient with patient understanding a private room after her 9 days at rehab will be 300.00.  Patient is agreeable to plan and her boyfriend will pay money prior to patient being transferred. Patient is made aware if she takes a semi-private room there will be no cost.  She requests the private room. [facility] will see patient this afternoon to sign PML and once paperwork completed and BF pays money, patient can be transferred back to SNF.  LCSW will send DC summary and ED notes to [facility]. FL2 does not need to be updated at this time as it is less than 24 hours old.  _______________________________________________ Raye Sorrowoble, Pearse Shiffler N, LCSW 11/14/2014, 9:58 AM

## 2014-11-14 NOTE — ED Notes (Signed)
Case manager at bedside 

## 2014-11-14 NOTE — Progress Notes (Signed)
LCSW reviewed bed offers with patient. She continues to voice she wants a private room. Reports she is interested in [facility] and [facility].   Patient aware if there are no private rooms she either has to go back to [facility] or go home.  LCSW will follow up with facilities this morning after 8:30am.  Kelly EmoryHannah Louis Ivery LCSW, MSW Clinical Social Work: Emergency Room [phone]

## 2014-11-15 ENCOUNTER — Encounter (HOSPITAL_COMMUNITY): Payer: Self-pay | Admitting: *Deleted

## 2014-11-15 ENCOUNTER — Emergency Department (HOSPITAL_COMMUNITY)
Admission: EM | Admit: 2014-11-15 | Discharge: 2014-11-16 | Disposition: A | Discharge status: Home / Self Care | Payer: Medicare Other | Attending: Emergency Medicine | Admitting: Emergency Medicine

## 2014-11-15 DIAGNOSIS — Z794 Long term (current) use of insulin: Secondary | ICD-10-CM | POA: Insufficient documentation

## 2014-11-15 DIAGNOSIS — Z8669 Personal history of other diseases of the nervous system and sense organs: Secondary | ICD-10-CM | POA: Insufficient documentation

## 2014-11-15 DIAGNOSIS — R011 Cardiac murmur, unspecified: Secondary | ICD-10-CM | POA: Insufficient documentation

## 2014-11-15 DIAGNOSIS — Z880 Allergy status to penicillin: Secondary | ICD-10-CM | POA: Insufficient documentation

## 2014-11-15 DIAGNOSIS — R2241 Localized swelling, mass and lump, right lower limb: Secondary | ICD-10-CM | POA: Insufficient documentation

## 2014-11-15 DIAGNOSIS — E119 Type 2 diabetes mellitus without complications: Secondary | ICD-10-CM | POA: Insufficient documentation

## 2014-11-15 DIAGNOSIS — M7989 Other specified soft tissue disorders: Secondary | ICD-10-CM | POA: Diagnosis not present

## 2014-11-15 DIAGNOSIS — Z7982 Long term (current) use of aspirin: Secondary | ICD-10-CM | POA: Insufficient documentation

## 2014-11-15 DIAGNOSIS — M1990 Unspecified osteoarthritis, unspecified site: Secondary | ICD-10-CM | POA: Insufficient documentation

## 2014-11-15 DIAGNOSIS — I10 Essential (primary) hypertension: Secondary | ICD-10-CM | POA: Insufficient documentation

## 2014-11-15 DIAGNOSIS — Z91040 Latex allergy status: Secondary | ICD-10-CM | POA: Insufficient documentation

## 2014-11-15 NOTE — ED Notes (Signed)
[facility]. Pt. States incision site is swollen more and was apparently diagnosed with infection and inflammatory reaction due to internal right hip prosthesis. Pt. Wants to be checked out.

## 2014-11-15 NOTE — ED Provider Notes (Signed)
CSN: [id number]     Arrival date & time 11/15/14  2313 History   This chart was scribed for Loren Raceravid Kotaro Buer, MD by Abel PrestoKara Demonbreun, ED Scribe. This patient was seen in room A10C/A10C and the patient's care was started at 11:39 PM.    Chief Complaint  Patient presents with  . Post-op Problem     The history is provided by the patient. No language interpreter was used.   HPI Comments: Kelly Richardson is a 64 y.o. female who presents to the Emergency Department complaining of increased swelling to the right hip and lower extremity. Pt is s/p hip replacement on 11/09/14. Pt had f/u appointment 3 days ago with normal healing. She states she was at physical therapy and nurse was concerned about the swelling and increased serosanguinous drainage from incision site.  Pt denies fever, chills, and pain currently. No shortness of breath or chest pain.    Past Medical History  Diagnosis Date  . Diabetes mellitus without complication   . Complication of anesthesia   . PONV (postoperative nausea and vomiting)   . Hypertension   . Heart murmur     as a child  . 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 Arthritis   . Shortness of breath dyspnea     sometimes  at night   Past Surgical History  Procedure Laterality Date  . Total hip arthroplasty Right 2011  . Abdominal hysterectomy    . Cholecystectomy  1995  . Tonsillectomy    . Incision and drainage hip Right 06/08/2014    Procedure: Irrigation and Debridement Right Hip, Revision of Cup and Head of Total Hip with  Placement  of Antibiotic Spacer;  Surgeon: Nadara MustardMarcus Duda V, MD;  Location: [facility] OR;  Service: Orthopedics;  Laterality: Right;  . Excisional total hip arthroplasty with antibiotic spacers Right 09/07/2014    Procedure: Excision Total Hip Arthroplasty, Place Antibiotic Spacer;  Surgeon: Nadara MustardMarcus Duda V, MD;  Location: [facility] OR;  Service: Orthopedics;  Laterality: Right;  . Colonoscopy    . Total hip revision Right 11/09/2014   Procedure: TOTAL HIP REVISION;  Surgeon: Nadara MustardMarcus Duda V, MD;  Location: [facility] OR;  Service: Orthopedics;  Laterality: Right;   History reviewed. No pertinent family history. History  Substance Use Topics  . Smoking status: Never Smoker   . Smokeless tobacco: Never Used  . Alcohol Use: No   OB History    No data available     Review of Systems  Constitutional: Negative for fever and chills.  Respiratory: Negative for cough and shortness of breath.   Cardiovascular: Positive for leg swelling. Negative for chest pain.  Gastrointestinal: Negative for nausea and vomiting.  Skin: Negative for rash.       Drainage from incision site  Neurological: Negative for dizziness, weakness, light-headedness, numbness and headaches.  All other systems reviewed and are negative.     Allergies  Metformin; Penicillins; Seldane; Sulfonamide derivatives; Vicks formula 44 cough relief; Xanax; Iohexol; Latex; Other; and Percocet  Home Medications   Prior to Admission medications   Medication Sig Start Date End Date Taking? Authorizing Provider  acetaminophen (TYLENOL) 325 MG tablet Take 650 mg by mouth every 6 (six) hours as needed for moderate pain.     Historical Provider, MD  aspirin EC 325 MG tablet Take 1 tablet (325 mg total) by mouth daily. 11/13/14   Nadara MustardMarcus Duda V, MD  aspirin EC 81 MG tablet Take 81 mg by  mouth daily.    Historical Provider, MD  HYDROcodone-acetaminophen (NORCO) 10-325 MG per tablet Take 1-2 tablets by mouth every 4 (four) hours as needed for moderate pain (breakthrough pain). 11/13/14   Nadara MustardMarcus Duda V, MD  insulin aspart (NOVOLOG) 100 UNIT/ML injection Inject 0-8 Units into the skin 3 (three) times daily before meals. If blood sugar over 185    Historical Provider, MD  LEVEMIR FLEXTOUCH 100 UNIT/ML SOPN Inject 45 Units into the skin at bedtime.  03/02/13   Historical Provider, MD   BP 129/78 mmHg  Pulse 99  Temp(Src) 97.2 F (36.2 C) (Oral)  Resp 14  Ht 5\' 1"  (1.549 m)  Wt 213  lb (96.616 kg)  BMI 40.27 kg/m2  SpO2 98% Physical Exam  Constitutional: She is oriented to person, place, and time. She appears well-developed and well-nourished. No distress.  HENT:  Head: Normocephalic and atraumatic.  Mouth/Throat: Oropharynx is clear and moist.  Eyes: EOM are normal. Pupils are equal, round, and reactive to light.  Neck: Normal range of motion. Neck supple.  Cardiovascular: Normal rate and regular rhythm.   Pulmonary/Chest: Effort normal and breath sounds normal. No respiratory distress. She has no wheezes. She has no rales. She exhibits no tenderness.  Abdominal: Soft. Bowel sounds are normal. She exhibits no distension and no mass. There is no tenderness. There is no rebound and no guarding.  Musculoskeletal: Normal range of motion. She exhibits no edema or tenderness.  Right lateral surgical incision appears to be well healing. There is no obvious postoperative infection. No active drainage. Patient does have a bandage with serous sanguinous drainage on it. The right lower extremity appears to be mildly enlarged compared to the left. She has no calf tenderness. Her distal pulses are intact.  Neurological: She is alert and oriented to person, place, and time.  Skin: Skin is warm and dry. No rash noted. No erythema.  Psychiatric: She has a normal mood and affect. Her behavior is normal.  Nursing note and vitals reviewed.   ED Course  Procedures (including critical care time) DIAGNOSTIC STUDIES: Oxygen Saturation is 98% on room air, normal by my interpretation.    COORDINATION OF CARE: 11:44 PM Discussed treatment plan with patient at beside, the patient agrees with the plan and has no further questions at this time.   Labs Review Labs Reviewed - No data to display  Imaging Review No results found.   EKG Interpretation None      MDM   Final diagnoses:  Right leg swelling    I personally performed the services described in this documentation, which  was scribed in my presence. The recorded information has been reviewed and is accurate.  Patient presents with mildly swollen right lower extremity compared to left. Given recent surgery with DVT will need to be ruled out. We'll arrange to have DVT study tomorrow morning. Patient is advised to follow-up with her orthopedist. Return precautions given.    Loren Raceravid Avyana Puffenbarger, MD 11/16/14 [phone]

## 2014-11-16 ENCOUNTER — Non-Acute Institutional Stay (SKILLED_NURSING_FACILITY): Payer: Medicare Other | Admitting: Adult Health

## 2014-11-16 DIAGNOSIS — E1165 Type 2 diabetes mellitus with hyperglycemia: Secondary | ICD-10-CM

## 2014-11-16 DIAGNOSIS — E139 Other specified diabetes mellitus without complications: Secondary | ICD-10-CM

## 2014-11-16 DIAGNOSIS — I10 Essential (primary) hypertension: Secondary | ICD-10-CM

## 2014-11-16 DIAGNOSIS — M869 Osteomyelitis, unspecified: Secondary | ICD-10-CM | POA: Diagnosis not present

## 2014-11-16 DIAGNOSIS — IMO0001 Reserved for inherently not codable concepts without codable children: Secondary | ICD-10-CM

## 2014-11-16 DIAGNOSIS — Z9660 Presence of unspecified orthopedic joint implant: Secondary | ICD-10-CM | POA: Diagnosis not present

## 2014-11-16 DIAGNOSIS — Z794 Long term (current) use of insulin: Secondary | ICD-10-CM

## 2014-11-16 DIAGNOSIS — Z96649 Presence of unspecified artificial hip joint: Secondary | ICD-10-CM

## 2014-11-16 NOTE — ED Notes (Signed)
Pt. Left with all belongings 

## 2014-11-16 NOTE — Discharge Instructions (Signed)
Peripheral Edema °You have swelling in your legs (peripheral edema). This swelling is due to excess accumulation of salt and water in your body. Edema may be a sign of heart, kidney or liver disease, or a side effect of a medication. It may also be due to problems in the leg veins. Elevating your legs and using special support stockings may be very helpful, if the cause of the swelling is due to poor venous circulation. Avoid long periods of standing, whatever the cause. °Treatment of edema depends on identifying the cause. Chips, pretzels, pickles and other salty foods should be avoided. Restricting salt in your diet is almost always needed. Water pills (diuretics) are often used to remove the excess salt and water from your body via urine. These medicines prevent the kidney from reabsorbing sodium. This increases urine flow. °Diuretic treatment may also result in lowering of potassium levels in your body. Potassium supplements may be needed if you have to use diuretics daily. Daily weights can help you keep track of your progress in clearing your edema. You should call your caregiver for follow up care as recommended. °SEEK IMMEDIATE MEDICAL CARE IF:  °· You have increased swelling, pain, redness, or heat in your legs. °· You develop shortness of breath, especially when lying down. °· You develop chest or abdominal pain, weakness, or fainting. °· You have a fever. °Document Released: 07/30/2004 Document Revised: 09/14/2011 Document Reviewed: 07/10/2009 °ExitCare® Patient Information ©2015 [employer]. This information is not intended to replace advice given to you by your health care provider. Make sure you discuss any questions you have with your health care provider. ° °

## 2014-11-16 NOTE — ED Notes (Signed)
[facility] contacted [facility]

## 2014-11-17 ENCOUNTER — Ambulatory Visit (HOSPITAL_COMMUNITY): Admission: RE | Admit: 2014-11-17 | Payer: Medicare Other | Source: Ambulatory Visit

## 2014-11-19 ENCOUNTER — Encounter: Payer: Self-pay | Admitting: Adult Health

## 2014-11-19 ENCOUNTER — Non-Acute Institutional Stay (SKILLED_NURSING_FACILITY): Payer: Medicare Other | Admitting: Adult Health

## 2014-11-19 DIAGNOSIS — Z9660 Presence of unspecified orthopedic joint implant: Secondary | ICD-10-CM | POA: Diagnosis not present

## 2014-11-19 DIAGNOSIS — E139 Other specified diabetes mellitus without complications: Secondary | ICD-10-CM

## 2014-11-19 DIAGNOSIS — E1165 Type 2 diabetes mellitus with hyperglycemia: Secondary | ICD-10-CM

## 2014-11-19 DIAGNOSIS — Z794 Long term (current) use of insulin: Secondary | ICD-10-CM

## 2014-11-19 DIAGNOSIS — I10 Essential (primary) hypertension: Secondary | ICD-10-CM | POA: Diagnosis not present

## 2014-11-19 DIAGNOSIS — IMO0001 Reserved for inherently not codable concepts without codable children: Secondary | ICD-10-CM

## 2014-11-19 DIAGNOSIS — Z96649 Presence of unspecified artificial hip joint: Secondary | ICD-10-CM

## 2014-11-19 NOTE — Progress Notes (Signed)
Patient ID: Katherene Pontondrea J Dileonardo, female   DOB: March 01, 1951, 64 y.o.   MRN: [id number]  Renette ButtersGolden living [place]     Allergies  Allergen Reactions  . Metformin Other (See Comments)    REACTION: faintness, DIZZINESS  . Penicillins Anaphylaxis  . Seldane [Terfenadine] Other (See Comments)    N/V, increased heart rate, seizures, hallinations.  . Sulfonamide Derivatives Anaphylaxis  . Vicks Formula 44 Cough Relief [Dextromethorphan Hbr] Other (See Comments)    Runs blood pressure up   . Xanax [Alprazolam] Other (See Comments)    Pt does not want Xanax--history of addiction.  . Iohexol Other (See Comments)     Desc: pt states [place] ct [place] and immediately experienced profuse vomiting and sweating.  She said they treated her like she was having a reaction and doesn't remember much else or if meds were given., Onset Date: [id number]   . Latex Other (See Comments)    BLISTERING  . Other Nausea And Vomiting    "Anything I have ever been given more nausea just increases it."  . Percocet [Oxycodone-Acetaminophen] Other (See Comments)    Hallucinations       Chief Complaint  Patient presents with  . Discharge Note    HPI:  She is being discharged to home with home health for her pt/ot/rn for gait; endurance; adl retraining and medication management. She will need a standard wheelchair in order for her to maintain her current level of independence with her adls which cannot be achieved with a walker. She will need her prescriptions and to follow up with her pcp.   She had been hospitalized for her right hip revision with antibiotic spacer and was admitted to this facility for short term rehab.    Past Medical History  Diagnosis Date  . Diabetes mellitus without complication   . Complication of anesthesia   . PONV (postoperative nausea and vomiting)   . Hypertension   . Heart murmur     as a child  . 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 Arthritis     . Shortness of breath dyspnea     sometimes  at night    Past Surgical History  Procedure Laterality Date  . Total hip arthroplasty Right 2011  . Abdominal hysterectomy    . Cholecystectomy  1995  . Tonsillectomy    . Incision and drainage hip Right 06/08/2014    Procedure: Irrigation and Debridement Right Hip, Revision of Cup and Head of Total Hip with  Placement  of Antibiotic Spacer;  Surgeon: Nadara MustardMarcus Duda V, MD;  Location: [facility] OR;  Service: Orthopedics;  Laterality: Right;  . Excisional total hip arthroplasty with antibiotic spacers Right 09/07/2014    Procedure: Excision Total Hip Arthroplasty, Place Antibiotic Spacer;  Surgeon: Nadara MustardMarcus Duda V, MD;  Location: [facility] OR;  Service: Orthopedics;  Laterality: Right;  . Colonoscopy    . Total hip revision Right 11/09/2014    Procedure: TOTAL HIP REVISION;  Surgeon: Nadara MustardMarcus Duda V, MD;  Location: [facility] OR;  Service: Orthopedics;  Laterality: Right;    VITAL SIGNS BP 138/74 mmHg  Pulse 80  Ht 5\' 1"  (1.549 m)  Wt 211 lb (95.709 kg)  BMI 39.89 kg/m2   Outpatient Encounter Prescriptions as of 11/19/2014  Medication Sig  . acetaminophen (TYLENOL) 325 MG tablet Take 650 mg by mouth every 6 (six) hours as needed for moderate pain.   Marland Kitchen. aspirin EC 81 MG tablet  Take 81 mg by mouth daily.  Marland Kitchen. HYDROcodone-acetaminophen (NORCO) 10-325 MG per tablet Take 1-2 tablets by mouth every 4 (four) hours as needed for moderate pain (breakthrough pain).  . insulin aspart (NOVOLOG) 100 UNIT/ML injection Inject 5 Units into the skin 3 (three) times daily before meals. Ac meals for cbg>=150  . LEVEMIR FLEXTOUCH 100 UNIT/ML SOPN Inject 45 Units into the skin at bedtime.       SIGNIFICANT DIAGNOSTIC EXAMS   11-09-14: right hip x-ray: Revision RIGHT total hip arthroplasty with antibiotic bead placement. Fracture of the proximal femur with lateral displacement.  LABS REVIEWED:   11-09-14: wbc 7.3; hgb 11.5; hct 3.7; mcv 72.9; plt 230; glucose 125; bun 18; creat 0.82;  k+5.5; na++138; liver normal albumin 4.1 11-10-14: wbc 9.0; hgb 7.1; hct 23.5; mcv 72.8; plt 226; glucose 144; bun 15; creat 0.88; k+4.4; na++137 11-12-14: wbc 8.3; hgb 8.9; hct 28.0; mcv 75.9; plt 189; glucose 150; bun 11; creat 0.82; k+3.2; na++136       ROS Constitutional: Negative for malaise/fatigue.  Respiratory: Negative for cough and shortness of breath.   Cardiovascular: Negative for chest pain, palpitations and leg swelling.  Gastrointestinal: Negative for heartburn, abdominal pain and constipation.  Musculoskeletal: Negative for myalgias and joint pain.       Pain is being managed   Skin:       Right  hip incision   Psychiatric/Behavioral: Negative for depression. The patient is not nervous/anxious.        Physical Exam Constitutional: She is oriented to person, place, and time. No distress.  Overweight   Neck: Neck supple. No JVD present. No thyromegaly present.  Cardiovascular: Normal rate, regular rhythm and intact distal pulses.   Respiratory: Effort normal and breath sounds normal. No respiratory distress. She has no wheezes.  GI: Soft. Bowel sounds are normal. She exhibits no distension. There is no tenderness.  Musculoskeletal: She exhibits no edema.  Is able to move all extremities  Is status post right  hip surgery   Neurological: She is alert and oriented to person, place, and time.  Skin: Skin is warm and dry. She is not diaphoretic.  Right  hip incision line no signs of infection present.        ASSESSMENT/ PLAN:  Will discharge to home with home health to evaluate and treat as indicated for pt/ot/rn: for gait; endurance; adl retraining and medication management. Her prescriptions have been written for a 30 day supply with #30 vicodin 10/325 mg tabs. She will need a standard wheelchair in order to maintain her current level of independence with her adl's which cannot be achieved with a walker; she can self propel. She has a follow up appointment scheduled  with her pcp.   Time spent with patient 45 minutes.    Synthia Innocenteborah Custer Pimenta NP [facility]  Contact [phone] Monday through Friday 8am- 5pm  After hours call [phone]

## 2014-11-19 NOTE — Progress Notes (Signed)
Patient ID: Kelly Richardson, female   DOB: 1951-02-01, 64 y.o.   MRN: [id number]  Kelly ButtersGolden living [place]     Allergies  Allergen Reactions  . Metformin Other (See Comments)    REACTION: faintness, DIZZINESS  . Penicillins Anaphylaxis  . Seldane [Terfenadine] Other (See Comments)    N/Richardson, increased heart rate, seizures, hallinations.  . Sulfonamide Derivatives Anaphylaxis  . Vicks Formula 44 Cough Relief [Dextromethorphan Hbr] Other (See Comments)    Runs blood pressure up   . Xanax [Alprazolam] Other (See Comments)    Pt does not want Xanax--history of addiction.  . Iohexol Other (See Comments)     Desc: pt states she [place] a ct in [place] in [place] and immediately experienced profuse vomiting and sweating.  She said they treated her like she was having a reaction and doesn't remember much else or if meds were given., Onset Date: [id number]   . Latex Other (See Comments)    BLISTERING  . Other Nausea And Vomiting    "Anything I have ever been given more nausea just increases it."  . Percocet [Oxycodone-Acetaminophen] Other (See Comments)    Hallucinations       Chief Complaint  Patient presents with  . Hospitalization Follow-up    HPI:  She has been hospitalized for a right hip arthroplasty revision with antibiotic hemiarthroplasty spacer. She is here for short term rehab with her goal to return home upon completion of rehab. She does have a copious amount of drainage from her incision line. Her goal is to return back home.    Past Medical History  Diagnosis Date  . Diabetes mellitus without complication   . Complication of anesthesia   . PONV (postoperative nausea and vomiting)   . Hypertension   . Heart murmur     as a child  . 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 Arthritis   . Shortness of breath dyspnea     sometimes  at night    Past Surgical History  Procedure Laterality Date  . Total hip arthroplasty Right 2011  . Abdominal  hysterectomy    . Cholecystectomy  1995  . Tonsillectomy    . Incision and drainage hip Right 06/08/2014    Procedure: Irrigation and Debridement Right Hip, Revision of Cup and Head of Total Hip with  Placement  of Antibiotic Spacer;  Surgeon: Kelly MustardMarcus Duda V, MD;  Location: [facility] OR;  Service: Orthopedics;  Laterality: Right;  . Excisional total hip arthroplasty with antibiotic spacers Right 09/07/2014    Procedure: Excision Total Hip Arthroplasty, Place Antibiotic Spacer;  Surgeon: Kelly MustardMarcus Duda V, MD;  Location: [facility];  Service: Orthopedics;  Laterality: Right;  . Colonoscopy    . Total hip revision Right 11/09/2014    Procedure: TOTAL HIP REVISION;  Surgeon: Kelly MustardMarcus Duda V, MD;  Location: [facility] OR;  Service: Orthopedics;  Laterality: Right;    VITAL SIGNS BP 123/64 mmHg  Pulse 70  Ht 5\' 1"  (1.549 m)  Wt 211 lb (95.709 kg)  BMI 39.89 kg/m2  SpO2 97%   Outpatient Encounter Prescriptions as of 11/16/2014  Medication Sig  . acetaminophen (TYLENOL) 325 MG tablet Take 650 mg by mouth every 6 (six) hours as needed for moderate pain.   Marland Kitchen. aspirin EC 81 MG tablet Take 81 mg by mouth daily.  Marland Kitchen. HYDROcodone-acetaminophen (NORCO) 10-325 MG per tablet Take 1-2 tablets by mouth every 4 (four) hours as needed for moderate pain (breakthrough pain).  .Marland Kitchen  insulin aspart (NOVOLOG) 100 UNIT/ML injection Inject 5 Units into the skin 3 (three) times daily before meals. Ac meals for cbg>=150  . LEVEMIR FLEXTOUCH 100 UNIT/ML SOPN Inject 45 Units into the skin at bedtime.       SIGNIFICANT DIAGNOSTIC EXAMS  11-09-14: right hip x-ray: Revision RIGHT total hip arthroplasty with antibiotic bead placement. Fracture of the proximal femur with lateral displacement.  LABS REVIEWED:   11-09-14: wbc 7.3; hgb 11.5; hct 3.7; mcv 72.9; plt 230; glucose 125; bun 18; creat 0.82; k+5.5; na++138; liver normal albumin 4.1 11-10-14: wbc 9.0; hgb 7.1; hct 23.5; mcv 72.8; plt 226; glucose 144; bun 15; creat 0.88; k+4.4; na++137 11-12-14: wbc  8.3; hgb 8.9; hct 28.0; mcv 75.9; plt 189; glucose 150; bun 11; creat 0.82; k+3.2; na++136      Review of Systems  Constitutional: Negative for malaise/fatigue.  Respiratory: Negative for cough and shortness of breath.   Cardiovascular: Negative for chest pain, palpitations and leg swelling.  Gastrointestinal: Negative for heartburn, abdominal pain and constipation.  Musculoskeletal: Negative for myalgias and joint pain.       Pain is being managed   Skin:       Right  hip incision line with copious amount of drainage   Psychiatric/Behavioral: Negative for depression. The patient is not nervous/anxious.      Physical Exam  Constitutional: She is oriented to person, place, and time. No distress.  Overweight   Neck: Neck supple. No JVD present. No thyromegaly present.  Cardiovascular: Normal rate, regular rhythm and intact distal pulses.   Respiratory: Effort normal and breath sounds normal. No respiratory distress. She has no wheezes.  GI: Soft. Bowel sounds are normal. She exhibits no distension. There is no tenderness.  Musculoskeletal: She exhibits no edema.  Is able to move all extremities  Is status post right  hip surgery   Neurological: She is alert and oriented to person, place, and time.  Skin: Skin is warm and dry. She is not diaphoretic.  Right  hip incision line with copious amount of serosanguinous drainage not signs of infection present.         ASSESSMENT/ PLAN:  1. Status post revision of total hip replacement: will continue to be followed by orthopedics as indicated. Will continue therapy as directed will continue wound care per facility protocol. Will continue vicodin 10/325 mg 1 or 2 tabs every 4 hours as needed.  Will continue to monitor her status.   2. Diabetes: will continue levemir 45 unit nightly and novolog 5 units prior to meals for cbg >=150.   3. Hypertension: is presently not on medications; will continue asa 81 mg daily    Time spent with  patient 50 minutes    Synthia Innocenteborah Rea Reser NP [facility]  Contact [phone] Monday through Friday 8am- 5pm  After hours call [phone]

## 2014-11-26 ENCOUNTER — Other Ambulatory Visit: Payer: Self-pay | Admitting: *Deleted

## 2014-11-26 MED ORDER — HYDROCODONE-ACETAMINOPHEN 10-325 MG PO TABS: ORAL_TABLET | ORAL | Status: DC

## 2014-11-26 NOTE — Telephone Encounter (Signed)
[facility] 

## 2014-11-27 DIAGNOSIS — I82401 Acute embolism and thrombosis of unspecified deep veins of right lower extremity: Secondary | ICD-10-CM | POA: Diagnosis not present

## 2014-11-27 DIAGNOSIS — Z794 Long term (current) use of insulin: Secondary | ICD-10-CM | POA: Diagnosis not present

## 2014-11-27 DIAGNOSIS — Z89621 Acquired absence of right hip joint: Secondary | ICD-10-CM | POA: Diagnosis not present

## 2014-11-27 DIAGNOSIS — E119 Type 2 diabetes mellitus without complications: Secondary | ICD-10-CM | POA: Diagnosis not present

## 2014-11-27 DIAGNOSIS — I10 Essential (primary) hypertension: Secondary | ICD-10-CM | POA: Diagnosis not present

## 2014-11-27 DIAGNOSIS — T8451XD Infection and inflammatory reaction due to internal right hip prosthesis, subsequent encounter: Secondary | ICD-10-CM | POA: Diagnosis not present

## 2014-11-28 DIAGNOSIS — M1611 Unilateral primary osteoarthritis, right hip: Secondary | ICD-10-CM | POA: Diagnosis not present

## 2014-11-28 DIAGNOSIS — Z96641 Presence of right artificial hip joint: Secondary | ICD-10-CM | POA: Diagnosis not present

## 2014-11-28 DIAGNOSIS — T8451XS Infection and inflammatory reaction due to internal right hip prosthesis, sequela: Secondary | ICD-10-CM | POA: Diagnosis not present

## 2014-11-29 DIAGNOSIS — T8451XD Infection and inflammatory reaction due to internal right hip prosthesis, subsequent encounter: Secondary | ICD-10-CM | POA: Diagnosis not present

## 2014-11-29 DIAGNOSIS — E119 Type 2 diabetes mellitus without complications: Secondary | ICD-10-CM | POA: Diagnosis not present

## 2014-11-29 DIAGNOSIS — Z794 Long term (current) use of insulin: Secondary | ICD-10-CM | POA: Diagnosis not present

## 2014-11-29 DIAGNOSIS — Z89621 Acquired absence of right hip joint: Secondary | ICD-10-CM | POA: Diagnosis not present

## 2014-11-29 DIAGNOSIS — I82401 Acute embolism and thrombosis of unspecified deep veins of right lower extremity: Secondary | ICD-10-CM | POA: Diagnosis not present

## 2014-11-29 DIAGNOSIS — I10 Essential (primary) hypertension: Secondary | ICD-10-CM | POA: Diagnosis not present

## 2014-11-30 DIAGNOSIS — I82401 Acute embolism and thrombosis of unspecified deep veins of right lower extremity: Secondary | ICD-10-CM | POA: Diagnosis not present

## 2014-11-30 DIAGNOSIS — Z89621 Acquired absence of right hip joint: Secondary | ICD-10-CM | POA: Diagnosis not present

## 2014-11-30 DIAGNOSIS — T8451XD Infection and inflammatory reaction due to internal right hip prosthesis, subsequent encounter: Secondary | ICD-10-CM | POA: Diagnosis not present

## 2014-11-30 DIAGNOSIS — E119 Type 2 diabetes mellitus without complications: Secondary | ICD-10-CM | POA: Diagnosis not present

## 2014-11-30 DIAGNOSIS — Z794 Long term (current) use of insulin: Secondary | ICD-10-CM | POA: Diagnosis not present

## 2014-11-30 DIAGNOSIS — I10 Essential (primary) hypertension: Secondary | ICD-10-CM | POA: Diagnosis not present

## 2014-12-03 DIAGNOSIS — Z89621 Acquired absence of right hip joint: Secondary | ICD-10-CM | POA: Diagnosis not present

## 2014-12-03 DIAGNOSIS — I82401 Acute embolism and thrombosis of unspecified deep veins of right lower extremity: Secondary | ICD-10-CM | POA: Diagnosis not present

## 2014-12-03 DIAGNOSIS — Z794 Long term (current) use of insulin: Secondary | ICD-10-CM | POA: Diagnosis not present

## 2014-12-03 DIAGNOSIS — I10 Essential (primary) hypertension: Secondary | ICD-10-CM | POA: Diagnosis not present

## 2014-12-03 DIAGNOSIS — E119 Type 2 diabetes mellitus without complications: Secondary | ICD-10-CM | POA: Diagnosis not present

## 2014-12-03 DIAGNOSIS — T8451XD Infection and inflammatory reaction due to internal right hip prosthesis, subsequent encounter: Secondary | ICD-10-CM | POA: Diagnosis not present

## 2014-12-05 DIAGNOSIS — E119 Type 2 diabetes mellitus without complications: Secondary | ICD-10-CM | POA: Diagnosis not present

## 2014-12-05 DIAGNOSIS — I82401 Acute embolism and thrombosis of unspecified deep veins of right lower extremity: Secondary | ICD-10-CM | POA: Diagnosis not present

## 2014-12-05 DIAGNOSIS — Z89621 Acquired absence of right hip joint: Secondary | ICD-10-CM | POA: Diagnosis not present

## 2014-12-05 DIAGNOSIS — T8451XD Infection and inflammatory reaction due to internal right hip prosthesis, subsequent encounter: Secondary | ICD-10-CM | POA: Diagnosis not present

## 2014-12-05 DIAGNOSIS — I10 Essential (primary) hypertension: Secondary | ICD-10-CM | POA: Diagnosis not present

## 2014-12-05 DIAGNOSIS — Z794 Long term (current) use of insulin: Secondary | ICD-10-CM | POA: Diagnosis not present

## 2014-12-07 DIAGNOSIS — E119 Type 2 diabetes mellitus without complications: Secondary | ICD-10-CM | POA: Diagnosis not present

## 2014-12-07 DIAGNOSIS — I10 Essential (primary) hypertension: Secondary | ICD-10-CM | POA: Diagnosis not present

## 2014-12-07 DIAGNOSIS — T8451XD Infection and inflammatory reaction due to internal right hip prosthesis, subsequent encounter: Secondary | ICD-10-CM | POA: Diagnosis not present

## 2014-12-07 DIAGNOSIS — L309 Dermatitis, unspecified: Secondary | ICD-10-CM | POA: Diagnosis not present

## 2014-12-07 DIAGNOSIS — I82401 Acute embolism and thrombosis of unspecified deep veins of right lower extremity: Secondary | ICD-10-CM | POA: Diagnosis not present

## 2014-12-07 DIAGNOSIS — Z6838 Body mass index (BMI) 38.0-38.9, adult: Secondary | ICD-10-CM | POA: Diagnosis not present

## 2014-12-07 DIAGNOSIS — Z96641 Presence of right artificial hip joint: Secondary | ICD-10-CM | POA: Diagnosis not present

## 2014-12-07 DIAGNOSIS — Z89621 Acquired absence of right hip joint: Secondary | ICD-10-CM | POA: Diagnosis not present

## 2014-12-07 DIAGNOSIS — Z794 Long term (current) use of insulin: Secondary | ICD-10-CM | POA: Diagnosis not present

## 2014-12-10 DIAGNOSIS — Z794 Long term (current) use of insulin: Secondary | ICD-10-CM | POA: Diagnosis not present

## 2014-12-10 DIAGNOSIS — E119 Type 2 diabetes mellitus without complications: Secondary | ICD-10-CM | POA: Diagnosis not present

## 2014-12-10 DIAGNOSIS — I10 Essential (primary) hypertension: Secondary | ICD-10-CM | POA: Diagnosis not present

## 2014-12-10 DIAGNOSIS — Z89621 Acquired absence of right hip joint: Secondary | ICD-10-CM | POA: Diagnosis not present

## 2014-12-10 DIAGNOSIS — I82401 Acute embolism and thrombosis of unspecified deep veins of right lower extremity: Secondary | ICD-10-CM | POA: Diagnosis not present

## 2014-12-10 DIAGNOSIS — T8451XD Infection and inflammatory reaction due to internal right hip prosthesis, subsequent encounter: Secondary | ICD-10-CM | POA: Diagnosis not present

## 2014-12-12 DIAGNOSIS — I10 Essential (primary) hypertension: Secondary | ICD-10-CM | POA: Diagnosis not present

## 2014-12-12 DIAGNOSIS — Z794 Long term (current) use of insulin: Secondary | ICD-10-CM | POA: Diagnosis not present

## 2014-12-12 DIAGNOSIS — T8451XD Infection and inflammatory reaction due to internal right hip prosthesis, subsequent encounter: Secondary | ICD-10-CM | POA: Diagnosis not present

## 2014-12-12 DIAGNOSIS — E119 Type 2 diabetes mellitus without complications: Secondary | ICD-10-CM | POA: Diagnosis not present

## 2014-12-12 DIAGNOSIS — I82401 Acute embolism and thrombosis of unspecified deep veins of right lower extremity: Secondary | ICD-10-CM | POA: Diagnosis not present

## 2014-12-12 DIAGNOSIS — Z89621 Acquired absence of right hip joint: Secondary | ICD-10-CM | POA: Diagnosis not present

## 2014-12-13 DIAGNOSIS — T8451XD Infection and inflammatory reaction due to internal right hip prosthesis, subsequent encounter: Secondary | ICD-10-CM | POA: Diagnosis not present

## 2014-12-13 DIAGNOSIS — I82401 Acute embolism and thrombosis of unspecified deep veins of right lower extremity: Secondary | ICD-10-CM | POA: Diagnosis not present

## 2014-12-13 DIAGNOSIS — Z89621 Acquired absence of right hip joint: Secondary | ICD-10-CM | POA: Diagnosis not present

## 2014-12-13 DIAGNOSIS — I10 Essential (primary) hypertension: Secondary | ICD-10-CM | POA: Diagnosis not present

## 2014-12-13 DIAGNOSIS — Z794 Long term (current) use of insulin: Secondary | ICD-10-CM | POA: Diagnosis not present

## 2014-12-13 DIAGNOSIS — E119 Type 2 diabetes mellitus without complications: Secondary | ICD-10-CM | POA: Diagnosis not present

## 2014-12-14 DIAGNOSIS — I10 Essential (primary) hypertension: Secondary | ICD-10-CM | POA: Diagnosis not present

## 2014-12-14 DIAGNOSIS — E119 Type 2 diabetes mellitus without complications: Secondary | ICD-10-CM | POA: Diagnosis not present

## 2014-12-14 DIAGNOSIS — Z89621 Acquired absence of right hip joint: Secondary | ICD-10-CM | POA: Diagnosis not present

## 2014-12-14 DIAGNOSIS — I82401 Acute embolism and thrombosis of unspecified deep veins of right lower extremity: Secondary | ICD-10-CM | POA: Diagnosis not present

## 2014-12-14 DIAGNOSIS — T8451XD Infection and inflammatory reaction due to internal right hip prosthesis, subsequent encounter: Secondary | ICD-10-CM | POA: Diagnosis not present

## 2014-12-14 DIAGNOSIS — Z794 Long term (current) use of insulin: Secondary | ICD-10-CM | POA: Diagnosis not present

## 2014-12-17 DIAGNOSIS — I82401 Acute embolism and thrombosis of unspecified deep veins of right lower extremity: Secondary | ICD-10-CM | POA: Diagnosis not present

## 2014-12-17 DIAGNOSIS — Z794 Long term (current) use of insulin: Secondary | ICD-10-CM | POA: Diagnosis not present

## 2014-12-17 DIAGNOSIS — I10 Essential (primary) hypertension: Secondary | ICD-10-CM | POA: Diagnosis not present

## 2014-12-17 DIAGNOSIS — E119 Type 2 diabetes mellitus without complications: Secondary | ICD-10-CM | POA: Diagnosis not present

## 2014-12-17 DIAGNOSIS — T8451XD Infection and inflammatory reaction due to internal right hip prosthesis, subsequent encounter: Secondary | ICD-10-CM | POA: Diagnosis not present

## 2014-12-17 DIAGNOSIS — Z89621 Acquired absence of right hip joint: Secondary | ICD-10-CM | POA: Diagnosis not present

## 2014-12-19 DIAGNOSIS — T8451XD Infection and inflammatory reaction due to internal right hip prosthesis, subsequent encounter: Secondary | ICD-10-CM | POA: Diagnosis not present

## 2014-12-19 DIAGNOSIS — Z89621 Acquired absence of right hip joint: Secondary | ICD-10-CM | POA: Diagnosis not present

## 2014-12-19 DIAGNOSIS — I10 Essential (primary) hypertension: Secondary | ICD-10-CM | POA: Diagnosis not present

## 2014-12-19 DIAGNOSIS — Z794 Long term (current) use of insulin: Secondary | ICD-10-CM | POA: Diagnosis not present

## 2014-12-19 DIAGNOSIS — E119 Type 2 diabetes mellitus without complications: Secondary | ICD-10-CM | POA: Diagnosis not present

## 2014-12-19 DIAGNOSIS — I82401 Acute embolism and thrombosis of unspecified deep veins of right lower extremity: Secondary | ICD-10-CM | POA: Diagnosis not present

## 2014-12-21 DIAGNOSIS — I10 Essential (primary) hypertension: Secondary | ICD-10-CM | POA: Diagnosis not present

## 2014-12-21 DIAGNOSIS — E119 Type 2 diabetes mellitus without complications: Secondary | ICD-10-CM | POA: Diagnosis not present

## 2014-12-21 DIAGNOSIS — Z794 Long term (current) use of insulin: Secondary | ICD-10-CM | POA: Diagnosis not present

## 2014-12-21 DIAGNOSIS — I82401 Acute embolism and thrombosis of unspecified deep veins of right lower extremity: Secondary | ICD-10-CM | POA: Diagnosis not present

## 2014-12-21 DIAGNOSIS — T8451XD Infection and inflammatory reaction due to internal right hip prosthesis, subsequent encounter: Secondary | ICD-10-CM | POA: Diagnosis not present

## 2014-12-21 DIAGNOSIS — Z89621 Acquired absence of right hip joint: Secondary | ICD-10-CM | POA: Diagnosis not present

## 2014-12-24 DIAGNOSIS — Z794 Long term (current) use of insulin: Secondary | ICD-10-CM | POA: Diagnosis not present

## 2014-12-24 DIAGNOSIS — I10 Essential (primary) hypertension: Secondary | ICD-10-CM | POA: Diagnosis not present

## 2014-12-24 DIAGNOSIS — T8451XD Infection and inflammatory reaction due to internal right hip prosthesis, subsequent encounter: Secondary | ICD-10-CM | POA: Diagnosis not present

## 2014-12-24 DIAGNOSIS — E119 Type 2 diabetes mellitus without complications: Secondary | ICD-10-CM | POA: Diagnosis not present

## 2014-12-24 DIAGNOSIS — I82401 Acute embolism and thrombosis of unspecified deep veins of right lower extremity: Secondary | ICD-10-CM | POA: Diagnosis not present

## 2014-12-24 DIAGNOSIS — Z89621 Acquired absence of right hip joint: Secondary | ICD-10-CM | POA: Diagnosis not present

## 2014-12-25 DIAGNOSIS — T8451XD Infection and inflammatory reaction due to internal right hip prosthesis, subsequent encounter: Secondary | ICD-10-CM | POA: Diagnosis not present

## 2014-12-25 DIAGNOSIS — Z794 Long term (current) use of insulin: Secondary | ICD-10-CM | POA: Diagnosis not present

## 2014-12-25 DIAGNOSIS — Z89621 Acquired absence of right hip joint: Secondary | ICD-10-CM | POA: Diagnosis not present

## 2014-12-25 DIAGNOSIS — E119 Type 2 diabetes mellitus without complications: Secondary | ICD-10-CM | POA: Diagnosis not present

## 2014-12-25 DIAGNOSIS — I82401 Acute embolism and thrombosis of unspecified deep veins of right lower extremity: Secondary | ICD-10-CM | POA: Diagnosis not present

## 2014-12-25 DIAGNOSIS — I10 Essential (primary) hypertension: Secondary | ICD-10-CM | POA: Diagnosis not present

## 2014-12-26 ENCOUNTER — Other Ambulatory Visit: Payer: Self-pay | Admitting: Adult Health

## 2014-12-26 DIAGNOSIS — Z89621 Acquired absence of right hip joint: Secondary | ICD-10-CM | POA: Diagnosis not present

## 2014-12-26 DIAGNOSIS — E119 Type 2 diabetes mellitus without complications: Secondary | ICD-10-CM | POA: Diagnosis not present

## 2014-12-26 DIAGNOSIS — Z794 Long term (current) use of insulin: Secondary | ICD-10-CM | POA: Diagnosis not present

## 2014-12-26 DIAGNOSIS — I10 Essential (primary) hypertension: Secondary | ICD-10-CM | POA: Diagnosis not present

## 2014-12-26 DIAGNOSIS — I82401 Acute embolism and thrombosis of unspecified deep veins of right lower extremity: Secondary | ICD-10-CM | POA: Diagnosis not present

## 2014-12-26 DIAGNOSIS — T8451XD Infection and inflammatory reaction due to internal right hip prosthesis, subsequent encounter: Secondary | ICD-10-CM | POA: Diagnosis not present

## 2014-12-27 DIAGNOSIS — Z89621 Acquired absence of right hip joint: Secondary | ICD-10-CM | POA: Diagnosis not present

## 2014-12-27 DIAGNOSIS — I82401 Acute embolism and thrombosis of unspecified deep veins of right lower extremity: Secondary | ICD-10-CM | POA: Diagnosis not present

## 2014-12-27 DIAGNOSIS — E119 Type 2 diabetes mellitus without complications: Secondary | ICD-10-CM | POA: Diagnosis not present

## 2014-12-27 DIAGNOSIS — Z794 Long term (current) use of insulin: Secondary | ICD-10-CM | POA: Diagnosis not present

## 2014-12-27 DIAGNOSIS — I10 Essential (primary) hypertension: Secondary | ICD-10-CM | POA: Diagnosis not present

## 2014-12-27 DIAGNOSIS — T8451XD Infection and inflammatory reaction due to internal right hip prosthesis, subsequent encounter: Secondary | ICD-10-CM | POA: Diagnosis not present

## 2014-12-28 DIAGNOSIS — I82401 Acute embolism and thrombosis of unspecified deep veins of right lower extremity: Secondary | ICD-10-CM | POA: Diagnosis not present

## 2014-12-28 DIAGNOSIS — I10 Essential (primary) hypertension: Secondary | ICD-10-CM | POA: Diagnosis not present

## 2014-12-28 DIAGNOSIS — Z794 Long term (current) use of insulin: Secondary | ICD-10-CM | POA: Diagnosis not present

## 2014-12-28 DIAGNOSIS — E119 Type 2 diabetes mellitus without complications: Secondary | ICD-10-CM | POA: Diagnosis not present

## 2014-12-28 DIAGNOSIS — Z89621 Acquired absence of right hip joint: Secondary | ICD-10-CM | POA: Diagnosis not present

## 2014-12-28 DIAGNOSIS — T8451XD Infection and inflammatory reaction due to internal right hip prosthesis, subsequent encounter: Secondary | ICD-10-CM | POA: Diagnosis not present

## 2014-12-31 DIAGNOSIS — I82401 Acute embolism and thrombosis of unspecified deep veins of right lower extremity: Secondary | ICD-10-CM | POA: Diagnosis not present

## 2014-12-31 DIAGNOSIS — Z794 Long term (current) use of insulin: Secondary | ICD-10-CM | POA: Diagnosis not present

## 2014-12-31 DIAGNOSIS — I10 Essential (primary) hypertension: Secondary | ICD-10-CM | POA: Diagnosis not present

## 2014-12-31 DIAGNOSIS — Z89621 Acquired absence of right hip joint: Secondary | ICD-10-CM | POA: Diagnosis not present

## 2014-12-31 DIAGNOSIS — T8451XD Infection and inflammatory reaction due to internal right hip prosthesis, subsequent encounter: Secondary | ICD-10-CM | POA: Diagnosis not present

## 2014-12-31 DIAGNOSIS — E119 Type 2 diabetes mellitus without complications: Secondary | ICD-10-CM | POA: Diagnosis not present

## 2015-01-01 DIAGNOSIS — T8451XD Infection and inflammatory reaction due to internal right hip prosthesis, subsequent encounter: Secondary | ICD-10-CM | POA: Diagnosis not present

## 2015-01-01 DIAGNOSIS — I10 Essential (primary) hypertension: Secondary | ICD-10-CM | POA: Diagnosis not present

## 2015-01-01 DIAGNOSIS — E119 Type 2 diabetes mellitus without complications: Secondary | ICD-10-CM | POA: Diagnosis not present

## 2015-01-01 DIAGNOSIS — Z794 Long term (current) use of insulin: Secondary | ICD-10-CM | POA: Diagnosis not present

## 2015-01-01 DIAGNOSIS — Z89621 Acquired absence of right hip joint: Secondary | ICD-10-CM | POA: Diagnosis not present

## 2015-01-01 DIAGNOSIS — I82401 Acute embolism and thrombosis of unspecified deep veins of right lower extremity: Secondary | ICD-10-CM | POA: Diagnosis not present

## 2015-01-02 DIAGNOSIS — Z794 Long term (current) use of insulin: Secondary | ICD-10-CM | POA: Diagnosis not present

## 2015-01-02 DIAGNOSIS — Z89621 Acquired absence of right hip joint: Secondary | ICD-10-CM | POA: Diagnosis not present

## 2015-01-02 DIAGNOSIS — I10 Essential (primary) hypertension: Secondary | ICD-10-CM | POA: Diagnosis not present

## 2015-01-02 DIAGNOSIS — E119 Type 2 diabetes mellitus without complications: Secondary | ICD-10-CM | POA: Diagnosis not present

## 2015-01-02 DIAGNOSIS — I82401 Acute embolism and thrombosis of unspecified deep veins of right lower extremity: Secondary | ICD-10-CM | POA: Diagnosis not present

## 2015-01-02 DIAGNOSIS — T8451XD Infection and inflammatory reaction due to internal right hip prosthesis, subsequent encounter: Secondary | ICD-10-CM | POA: Diagnosis not present

## 2015-01-03 DIAGNOSIS — Z89621 Acquired absence of right hip joint: Secondary | ICD-10-CM | POA: Diagnosis not present

## 2015-01-03 DIAGNOSIS — I82401 Acute embolism and thrombosis of unspecified deep veins of right lower extremity: Secondary | ICD-10-CM | POA: Diagnosis not present

## 2015-01-03 DIAGNOSIS — E119 Type 2 diabetes mellitus without complications: Secondary | ICD-10-CM | POA: Diagnosis not present

## 2015-01-03 DIAGNOSIS — Z794 Long term (current) use of insulin: Secondary | ICD-10-CM | POA: Diagnosis not present

## 2015-01-03 DIAGNOSIS — I10 Essential (primary) hypertension: Secondary | ICD-10-CM | POA: Diagnosis not present

## 2015-01-03 DIAGNOSIS — T8451XD Infection and inflammatory reaction due to internal right hip prosthesis, subsequent encounter: Secondary | ICD-10-CM | POA: Diagnosis not present

## 2015-01-08 DIAGNOSIS — I10 Essential (primary) hypertension: Secondary | ICD-10-CM | POA: Diagnosis not present

## 2015-01-08 DIAGNOSIS — Z89621 Acquired absence of right hip joint: Secondary | ICD-10-CM | POA: Diagnosis not present

## 2015-01-08 DIAGNOSIS — E119 Type 2 diabetes mellitus without complications: Secondary | ICD-10-CM | POA: Diagnosis not present

## 2015-01-08 DIAGNOSIS — Z794 Long term (current) use of insulin: Secondary | ICD-10-CM | POA: Diagnosis not present

## 2015-01-08 DIAGNOSIS — I82401 Acute embolism and thrombosis of unspecified deep veins of right lower extremity: Secondary | ICD-10-CM | POA: Diagnosis not present

## 2015-01-08 DIAGNOSIS — T8451XD Infection and inflammatory reaction due to internal right hip prosthesis, subsequent encounter: Secondary | ICD-10-CM | POA: Diagnosis not present

## 2015-01-09 DIAGNOSIS — E119 Type 2 diabetes mellitus without complications: Secondary | ICD-10-CM | POA: Diagnosis not present

## 2015-01-09 DIAGNOSIS — I82401 Acute embolism and thrombosis of unspecified deep veins of right lower extremity: Secondary | ICD-10-CM | POA: Diagnosis not present

## 2015-01-09 DIAGNOSIS — Z794 Long term (current) use of insulin: Secondary | ICD-10-CM | POA: Diagnosis not present

## 2015-01-09 DIAGNOSIS — I10 Essential (primary) hypertension: Secondary | ICD-10-CM | POA: Diagnosis not present

## 2015-01-09 DIAGNOSIS — T8451XD Infection and inflammatory reaction due to internal right hip prosthesis, subsequent encounter: Secondary | ICD-10-CM | POA: Diagnosis not present

## 2015-01-09 DIAGNOSIS — Z89621 Acquired absence of right hip joint: Secondary | ICD-10-CM | POA: Diagnosis not present

## 2015-01-15 DIAGNOSIS — I82401 Acute embolism and thrombosis of unspecified deep veins of right lower extremity: Secondary | ICD-10-CM | POA: Diagnosis not present

## 2015-01-15 DIAGNOSIS — E119 Type 2 diabetes mellitus without complications: Secondary | ICD-10-CM | POA: Diagnosis not present

## 2015-01-15 DIAGNOSIS — T8451XD Infection and inflammatory reaction due to internal right hip prosthesis, subsequent encounter: Secondary | ICD-10-CM | POA: Diagnosis not present

## 2015-01-15 DIAGNOSIS — Z794 Long term (current) use of insulin: Secondary | ICD-10-CM | POA: Diagnosis not present

## 2015-01-15 DIAGNOSIS — Z89621 Acquired absence of right hip joint: Secondary | ICD-10-CM | POA: Diagnosis not present

## 2015-01-15 DIAGNOSIS — I10 Essential (primary) hypertension: Secondary | ICD-10-CM | POA: Diagnosis not present

## 2015-01-17 DIAGNOSIS — Z794 Long term (current) use of insulin: Secondary | ICD-10-CM | POA: Diagnosis not present

## 2015-01-17 DIAGNOSIS — I10 Essential (primary) hypertension: Secondary | ICD-10-CM | POA: Diagnosis not present

## 2015-01-17 DIAGNOSIS — T8451XD Infection and inflammatory reaction due to internal right hip prosthesis, subsequent encounter: Secondary | ICD-10-CM | POA: Diagnosis not present

## 2015-01-17 DIAGNOSIS — I82401 Acute embolism and thrombosis of unspecified deep veins of right lower extremity: Secondary | ICD-10-CM | POA: Diagnosis not present

## 2015-01-17 DIAGNOSIS — Z89621 Acquired absence of right hip joint: Secondary | ICD-10-CM | POA: Diagnosis not present

## 2015-01-17 DIAGNOSIS — E119 Type 2 diabetes mellitus without complications: Secondary | ICD-10-CM | POA: Diagnosis not present

## 2015-02-28 DIAGNOSIS — Z96641 Presence of right artificial hip joint: Secondary | ICD-10-CM | POA: Diagnosis not present

## 2015-02-28 DIAGNOSIS — M1611 Unilateral primary osteoarthritis, right hip: Secondary | ICD-10-CM | POA: Diagnosis not present

## 2015-02-28 DIAGNOSIS — T8451XS Infection and inflammatory reaction due to internal right hip prosthesis, sequela: Secondary | ICD-10-CM | POA: Diagnosis not present

## 2015-04-02 DIAGNOSIS — M25551 Pain in right hip: Secondary | ICD-10-CM | POA: Diagnosis not present

## 2015-04-09 ENCOUNTER — Encounter: Payer: Self-pay | Admitting: Infectious Diseases

## 2015-04-09 ENCOUNTER — Ambulatory Visit (INDEPENDENT_AMBULATORY_CARE_PROVIDER_SITE_OTHER): Payer: Medicare Other | Admitting: Infectious Diseases

## 2015-04-09 VITALS — BP 118/79 | HR 92 | Temp 97.5°F | Wt 226.0 lb

## 2015-04-09 DIAGNOSIS — M869 Osteomyelitis, unspecified: Secondary | ICD-10-CM | POA: Diagnosis not present

## 2015-04-09 DIAGNOSIS — Z794 Long term (current) use of insulin: Secondary | ICD-10-CM

## 2015-04-09 DIAGNOSIS — E1165 Type 2 diabetes mellitus with hyperglycemia: Secondary | ICD-10-CM

## 2015-04-09 DIAGNOSIS — IMO0001 Reserved for inherently not codable concepts without codable children: Secondary | ICD-10-CM

## 2015-04-09 DIAGNOSIS — Z20828 Contact with and (suspected) exposure to other viral communicable diseases: Secondary | ICD-10-CM | POA: Diagnosis not present

## 2015-04-09 DIAGNOSIS — Z206 Contact with and (suspected) exposure to human immunodeficiency virus [HIV]: Secondary | ICD-10-CM | POA: Diagnosis not present

## 2015-04-09 DIAGNOSIS — Z0000 Encounter for general adult medical examination without abnormal findings: Secondary | ICD-10-CM | POA: Diagnosis not present

## 2015-04-09 DIAGNOSIS — Z113 Encounter for screening for infections with a predominantly sexual mode of transmission: Secondary | ICD-10-CM

## 2015-04-09 DIAGNOSIS — E119 Type 2 diabetes mellitus without complications: Secondary | ICD-10-CM | POA: Diagnosis not present

## 2015-04-09 DIAGNOSIS — Z114 Encounter for screening for human immunodeficiency virus [HIV]: Secondary | ICD-10-CM

## 2015-04-09 DIAGNOSIS — Z1159 Encounter for screening for other viral diseases: Secondary | ICD-10-CM | POA: Diagnosis not present

## 2015-04-09 LAB — CBC
HCT: 37.3 % (ref 36.0–46.0)
HEMOGLOBIN: 11.5 g/dL — AB (ref 12.0–15.0)
MCH: 23 pg — ABNORMAL LOW (ref 26.0–34.0)
MCHC: 30.8 g/dL (ref 30.0–36.0)
MCV: 74.5 fL — ABNORMAL LOW (ref 78.0–100.0)
MPV: 9.4 fL (ref 8.6–12.4)
Platelets: 246 10*3/uL (ref 150–400)
RBC: 5.01 MIL/uL (ref 3.87–5.11)
RDW: 17.1 % — ABNORMAL HIGH (ref 11.5–15.5)
WBC: 7.1 10*3/uL (ref 4.0–10.5)

## 2015-04-09 LAB — COMPREHENSIVE METABOLIC PANEL
ALBUMIN: 4.1 g/dL (ref 3.6–5.1)
ALT: 17 U/L (ref 6–29)
AST: 16 U/L (ref 10–35)
Alkaline Phosphatase: 103 U/L (ref 33–130)
BILIRUBIN TOTAL: 0.4 mg/dL (ref 0.2–1.2)
BUN: 23 mg/dL (ref 7–25)
CO2: 28 mmol/L (ref 20–31)
Calcium: 9.7 mg/dL (ref 8.6–10.4)
Chloride: 102 mmol/L (ref 98–110)
Creat: 1.01 mg/dL — ABNORMAL HIGH (ref 0.50–0.99)
GLUCOSE: 249 mg/dL — AB (ref 65–99)
Potassium: 4.4 mmol/L (ref 3.5–5.3)
Sodium: 138 mmol/L (ref 135–146)
Total Protein: 7.1 g/dL (ref 6.1–8.1)

## 2015-04-09 LAB — C-REACTIVE PROTEIN: CRP: 0.5 mg/dL (ref <0.60)

## 2015-04-09 MED ORDER — DOXYCYCLINE HYCLATE 100 MG PO TABS: 100.0000 mg | ORAL_TABLET | Freq: Two times a day (BID) | ORAL | Status: DC

## 2015-04-09 NOTE — Assessment & Plan Note (Addendum)
Will continue her doxy for 1 year post op.  Will check her ESR and CRP. Will refill her doxy.  Will check LFTs while on doxy Will see her back in 6 months

## 2015-04-09 NOTE — Progress Notes (Signed)
   Subjective:    Patient ID: Kelly Richardson, female    DOB: 07-21-1950, 64 y.o.   MRN: [id number]  HPI 64 yo F with hx of DM2 for last year, who had R THR ~2011. She had open wound and clear d/c for the last 6 months. She was seen by her PMD and was given topical anbx for this. In November, her leg began to swell and she was started on po doxy. She states that the d/c became purulent at this point. She was sent to general surgery who immediately sent her to ortho. She had a CT showing a possible abscess and she was brought to [facility] emergently on 12-4. She underwent Irrigation and Debridement Right Hip, Revision of Cup and Head of Total Hip with Placement of Antibiotic Beads with 1g vancomycin and 240 mg gentamicin. She was started on vanco/levaqun and then was d/c home on 12-8 on same. Her Cx were (-).   She was seen in ID on 1-7 and had her IV anbx stopped. She was then changed to po doxy. Had no trouble taking these. Stopped taking these when she ran out.   Since 08-25-14 she noticed swelling of her R hip and worsening of the hole in her hip. No f/c. No pain. She noted a blister over her wound. No d/c from her wound. She saw Dr Lajoyce Corners and had aspirate done, no growth. She was started on cipro. She was seen in ID on 3-2 and was then admitted on 3-4 and she underwent resection of her THR with placement of anbx spacer. She was restarted on vanco/levaquin. Her operative/tissue Cx was poly-microbial.   Was seen in ID 10-2014 and was taken off anbx. Then was started on doxy by separate provider.   She had removal of her spacer in OR and placement of THR on 11-2014. Today she feels like she is doing well- still needs to use a walker. This unchanged from prior to her THR. She is unable to negotiate steps.   Wound on her hip has closed. No f/c.  Still on doxy.   Review of Systems  Constitutional: Negative for fever, chills, appetite change and unexpected weight change.  Gastrointestinal: Negative for  diarrhea and constipation.  Genitourinary: Negative for difficulty urinating.  Musculoskeletal: Positive for arthralgias.  FSG have been "good", ~ 150-200 at HS.      Objective:   Physical Exam  Constitutional: She appears well-developed and well-nourished.  HENT:  Mouth/Throat: No oropharyngeal exudate.  Eyes: EOM are normal. Pupils are equal, round, and reactive to light.  Neck: Neck supple.  Cardiovascular: Normal rate, regular rhythm and normal heart sounds.   Pulmonary/Chest: Effort normal and breath sounds normal.  Abdominal: Soft. Bowel sounds are normal. There is no tenderness. There is no rebound.  Musculoskeletal:       Legs: Lymphadenopathy:    She has no cervical adenopathy.       Assessment & Plan:

## 2015-04-09 NOTE — Assessment & Plan Note (Signed)
She is doing fair.  Will continue to work towards control.  She will f/u with PCP

## 2015-04-09 NOTE — Assessment & Plan Note (Signed)
Refuses flu shot States she was exposed to HIV at the [facility]. Will recheck her screening.

## 2015-04-10 LAB — SEDIMENTATION RATE: SED RATE: 26 mm/h (ref 0–30)

## 2015-04-10 LAB — HIV ANTIBODY (ROUTINE TESTING W REFLEX): HIV 1&2 Ab, 4th Generation: NONREACTIVE

## 2015-04-16 ENCOUNTER — Encounter: Payer: Self-pay | Admitting: Podiatry

## 2015-04-16 ENCOUNTER — Ambulatory Visit (INDEPENDENT_AMBULATORY_CARE_PROVIDER_SITE_OTHER): Payer: Medicare Other | Admitting: Podiatry

## 2015-04-16 DIAGNOSIS — E119 Type 2 diabetes mellitus without complications: Secondary | ICD-10-CM | POA: Diagnosis not present

## 2015-04-16 DIAGNOSIS — L608 Other nail disorders: Secondary | ICD-10-CM

## 2015-04-16 DIAGNOSIS — L609 Nail disorder, unspecified: Secondary | ICD-10-CM | POA: Diagnosis not present

## 2015-04-16 NOTE — Progress Notes (Signed)
Patient ID: Kelly Richardson, female   DOB: 04-21-1951, 64 y.o.   MRN: [id number]  Subjective: This patient presents today requesting debridement of elongated toenails 10. Last visit and office was 04/19/2014  Type II diabetic No open skin lesions bilaterally Patient denies any history of skin ulceration claudication or amputation  Vascular: Capillary reflex immediate bilaterally DP and PT pulses 2/4 bilaterally  Neurological: Sensation to 10 g monofilament wire intact 5/5 bilaterally Vibratory sensation intact bilaterally Ankle reflex equal and reactive bilaterally  Dermatological: Elongated, incurvated, normal texture toenails 10 Keratoses medial left hallux  Musculoskeletal: Pes planus bilaterally  Assessment: Diabetic without complications Incurvated toenails 10  Plan: Debrided toenails 10 mechanically and left great without any bleeding  Reappoint yearly or at patient's request

## 2015-04-16 NOTE — Patient Instructions (Signed)
Diabetes and Foot Care Diabetes may cause you to have problems because of poor blood supply (circulation) to your feet and legs. This may cause the skin on your feet to become thinner, break easier, and heal more slowly. Your skin may become dry, and the skin may peel and crack. You may also have nerve damage in your legs and feet causing decreased feeling in them. You may not notice minor injuries to your feet that could lead to infections or more serious problems. Taking care of your feet is one of the most important things you can do for yourself.  HOME CARE INSTRUCTIONS  Wear shoes at all times, even in the house. Do not go barefoot. Bare feet are easily injured.  Check your feet daily for blisters, cuts, and redness. If you cannot see the bottom of your feet, use a mirror or ask someone for help.  Wash your feet with warm water (do not use hot water) and mild soap. Then pat your feet and the areas between your toes until they are completely dry. Do not soak your feet as this can dry your skin.  Apply a moisturizing lotion or petroleum jelly (that does not contain alcohol and is unscented) to the skin on your feet and to dry, brittle toenails. Do not apply lotion between your toes.  Trim your toenails straight across. Do not dig under them or around the cuticle. File the edges of your nails with an emery board or nail file.  Do not cut corns or calluses or try to remove them with medicine.  Wear clean socks or stockings every day. Make sure they are not too tight. Do not wear knee-high stockings since they may decrease blood flow to your legs.  Wear shoes that fit properly and have enough cushioning. To break in new shoes, wear them for just a few hours a day. This prevents you from injuring your feet. Always look in your shoes before you put them on to be sure there are no objects inside.  Do not cross your legs. This may decrease the blood flow to your feet.  If you find a minor scrape,  cut, or break in the skin on your feet, keep it and the skin around it clean and dry. These areas may be cleansed with mild soap and water. Do not cleanse the area with peroxide, alcohol, or iodine.  When you remove an adhesive bandage, be sure not to damage the skin around it.  If you have a wound, look at it several times a day to make sure it is healing.  Do not use heating pads or hot water bottles. They may burn your skin. If you have lost feeling in your feet or legs, you may not know it is happening until it is too late.  Make sure your health care provider performs a complete foot exam at least annually or more often if you have foot problems. Report any cuts, sores, or bruises to your health care provider immediately. SEEK MEDICAL CARE IF:   You have an injury that is not healing.  You have cuts or breaks in the skin.  You have an ingrown nail.  You notice redness on your legs or feet.  You feel burning or tingling in your legs or feet.  You have pain or cramps in your legs and feet.  Your legs or feet are numb.  Your feet always feel cold. SEEK IMMEDIATE MEDICAL CARE IF:   There is increasing redness,   swelling, or pain in or around a wound.  There is a red line that goes up your leg.  Pus is coming from a wound.  You develop a fever or as directed by your health care provider.  You notice a bad smell coming from an ulcer or wound.   This information is not intended to replace advice given to you by your health care provider. Make sure you discuss any questions you have with your health care provider.   Document Released: 06/19/2000 Document Revised: 02/22/2013 Document Reviewed: 11/29/2012 Elsevier Interactive Patient Education 2016 [employer]  

## 2015-09-13 ENCOUNTER — Encounter (HOSPITAL_COMMUNITY): Payer: Self-pay | Admitting: *Deleted

## 2015-09-13 ENCOUNTER — Emergency Department (INDEPENDENT_AMBULATORY_CARE_PROVIDER_SITE_OTHER)
Admission: EM | Admit: 2015-09-13 | Discharge: 2015-09-13 | Disposition: A | Discharge status: Home / Self Care | Payer: Medicare Other | Source: Home / Self Care | Attending: Emergency Medicine | Admitting: Emergency Medicine

## 2015-09-13 DIAGNOSIS — Z760 Encounter for issue of repeat prescription: Secondary | ICD-10-CM

## 2015-09-13 NOTE — ED Provider Notes (Signed)
HPI  SUBJECTIVE:  Kelly Richardson is a 65 y.o. female who presents for refill of her Aveta Plus blood glucose test strips. She is diabetic, is on Levemir 45 units daily at bedtime, and NovoLog before meals if glucose is above 151. Patient states that her glucose has been running within normal limits for her, in the 100s to 200. She denies any symptoms of hyperglycemia such as increased thirst, unintentional weight loss, increased urination, chest pain, shortness of breath, abdominal pain, diarrhea. No aggravating or alleviating factors. She has not tried anything for this. She is currently in between doctors, but states that she is "calling around" to establish care with a primary care physician. Past medical history negative for hypertension per patient.  Past Medical History  Diagnosis Date  . Diabetes mellitus without complication (HCC)   . Complication of anesthesia   . PONV (postoperative nausea and vomiting)   . Hypertension   . Heart murmur     as a child  . 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 Arthritis   . Shortness of breath dyspnea     sometimes  at night    Past Surgical History  Procedure Laterality Date  . Total hip arthroplasty Right 2011  . Abdominal hysterectomy    . Cholecystectomy  1995  . Tonsillectomy    . Incision and drainage hip Right 06/08/2014    Procedure: Irrigation and Debridement Right Hip, Revision of Cup and Head of Total Hip with  Placement  of Antibiotic Spacer;  Surgeon: Nadara MustardMarcus Duda V, MD;  Location: [facility] OR;  Service: Orthopedics;  Laterality: Right;  . Excisional total hip arthroplasty with antibiotic spacers Right 09/07/2014    Procedure: Excision Total Hip Arthroplasty, Place Antibiotic Spacer;  Surgeon: Nadara MustardMarcus Duda V, MD;  Location: [facility] OR;  Service: Orthopedics;  Laterality: Right;  . Colonoscopy    . Total hip revision Right 11/09/2014    Procedure: TOTAL HIP REVISION;  Surgeon: Nadara MustardMarcus Duda V, MD;  Location: [facility] OR;   Service: Orthopedics;  Laterality: Right;    History reviewed. No pertinent family history.  Social History  Substance Use Topics  . Smoking status: Never Smoker   . Smokeless tobacco: Never Used  . Alcohol Use: No    No current facility-administered medications for this encounter.  Current outpatient prescriptions:  .  acetaminophen (TYLENOL) 325 MG tablet, Take 650 mg by mouth every 6 (six) hours as needed for moderate pain. , Disp: , Rfl:  .  aspirin EC 81 MG tablet, Take 81 mg by mouth daily., Disp: , Rfl:  .  insulin aspart (NOVOLOG) 100 UNIT/ML injection, Inject 5 Units into the skin 3 (three) times daily before meals. Ac meals for cbg>=150, Disp: , Rfl:  .  LEVEMIR FLEXTOUCH 100 UNIT/ML SOPN, Inject 45 Units into the skin at bedtime. , Disp: , Rfl:   Allergies  Allergen Reactions  . Metformin Other (See Comments)    REACTION: faintness, DIZZINESS  . Penicillins Anaphylaxis  . Seldane [Terfenadine] Other (See Comments)    N/V, increased heart rate, seizures, hallinations.  . Sulfonamide Derivatives Anaphylaxis  . Vicks Formula 44 Cough Relief [Dextromethorphan Hbr] Other (See Comments)    Runs blood pressure up   . Xanax [Alprazolam] Other (See Comments)    Pt does not want Xanax--history of addiction.  . Iohexol Other (See Comments)     Desc: pt states she had a ct in [place] in 2000 and immediately experienced  profuse vomiting and sweating.  She said they treated her like she was having a reaction and doesn't remember much else or if meds were given., Onset Date: [id number]   . Latex Other (See Comments)    BLISTERING  . Other Nausea And Vomiting    "Anything I have ever been given more nausea just increases it."  . Percocet [Oxycodone-Acetaminophen] Other (See Comments)    Hallucinations     ROS  As noted in HPI.   Physical Exam  BP 155/97 mmHg  Pulse 83  Temp(Src) 98.1 F (36.7 C) (Oral)  Resp 16  SpO2 100%  Constitutional: Well developed, well nourished,  no acute distress Eyes:  EOMI, conjunctiva normal bilaterally HENT: Normocephalic, atraumatic,mucus membranes moist Respiratory: Normal inspiratory effort Cardiovascular: Normal rate GI: nondistended skin: No rash, skin intact Musculoskeletal: no deformities Neurologic: Alert & oriented x 3, no focal neuro deficits Psychiatric: Speech and behavior appropriate   ED Course   Medications - No data to display  No orders of the defined types were placed in this encounter.    No results found for this or any previous visit (from the past 24 hour(s)). No results found.  ED Clinical Impression  Medication refill  ED Assessment/Plan  Handwriting prescription for of aveta plus strips dispense 200, check blood sugar 4 times a day. She has no reported symptoms of hyperglycemia, vitals are acceptable. Not checking blood sugar here. Will scan written prescription.  We'll give patient primary care referral list. Follow-up with them as needed. Continue current insulin regimen. To the ER for hyperglycemia. Patient agrees with plan.    *This clinic note was created using Dragon dictation software. Therefore, there may be occasional mistakes despite careful proofreading.  ?   Domenick Gong, MD 09/13/15 [phone]

## 2015-09-13 NOTE — Discharge Instructions (Signed)
Go to www.goodrx.com to look up your medications. This will give you a list of where you can find your prescriptions at the most affordable prices.  ° °This practice is taking new patients. They will see you even if you do not have insurance. ° °[facility] °[address] °[place], [place]  °[address] °[phone] ° °If you have no primary doctor, here are some resources that may be helpful: ° °- [facility]: °[address], [place], [place] [address] [place], [place] [address] °[phone] ° °Medicaid-accepting [place] Providers: °- [facility]- [address]  °[phone];  ° °- [facility]- [address], [address] °[phone] ° °- [facility]- [address], [address] °[phone] ° °- [facility]- [address] [place] [address] °[phone] ° °- [facility]- [address] °[phone]. Only accepts [place] Access Medicaid patients after they have her name applied to their card ° °-Dr. George Osei-Bonsu, [facility]. [address] [place] [address]    [place], [place] [address]  °[phone] ° °Self Pay (no insurance) in [place]: °- Sickle Cell Patients: Dr Eric Dean, [facility] [address] [phone] ° °- [facility]- [phone] ° °- Physician Referral Service- [phone] ° °- [facility]- [address], [place], [phone];  °Monday to Friday, 9 a.m. - 7 p.m.; Saturday 9 a.m. to 1 p.m. ° °- [facility] [place]- [address] [place], [place] [phone] ° °- [facility]- [address] [place] [address] [phone] °- [facility]- [address] [phone] °- [facility], [address] [place]; [phone]; or [address] [place] [address], [place]; [phone].  ° °[facility] [place], [address] [place]; [phone]; Monday to  Wednesday, 8:30 a.m. - 5 p.m.; Thursday, 8:30 a.m. - 8 p.m. ° °[facility], [address], [place]; [phone]; Monday to Friday, 8 a.m. - 4:30 p.m.  ° °[facility], [address] [place], [phone]; first and third Saturday of the month, 9:30 a.m. - 12:30 p.m. ° °[facility], [address] [place], [phone]; second Saturday of the month, 9 a.m. -noon. ° °[facility] for children. For information, call [phone]; [phone]; or [phone]. ° °Other agencies that provide inexpensive medical care: ° °   [facility]  [phone] °    Internal Medicine  [phone] °   [facility]  [phone]  [place] [address] °   [facility]  [phone] °   [facility]  [phone], [phone]; or [phone]. ° °Chronic Pain Problems °Contact [facility]  [phone] °Patients need to be referred by their primary care doctor. ° °[place] Resources ° °[facility] [place]     [employer]                          [place] [facility] °[address] [place]                       [address]      [address]  ° °[phone] (After Hours) ° °General Information: °Finding a doctor when you do not have health insurance   can be tricky. Although you are not limited by an insurance plan, you are of course limited by her finances and how much but he can pay out of pocket. ° °What are your options if you don't have health insurance? °  °1) Find a Doctor and Pay Out of Pocket °Although you won't have to find out who is covered by your insurance plan, it is a good idea to ask around and get recommendations. You will then need to call the office and see if the doctor you have chosen will accept you as a new patient and what types of options they offer for patients who are self-pay. Some doctors offer discounts or will set up payment  plans for their patients who do not have insurance, but you will need to ask so you aren't surprised when you get to your appointment. ° °2) Contact Your Local Health Department °Not all health departments have doctors that can see patients for sick visits, but many do, so it is worth a call to see if yours does. If you don't know where your local health department is, you can check in your phone book. The CDC also has a tool to help you locate your state's health department, and many state websites also have listings of all of their local health departments. ° °3) Find a Walk-in Clinic °If your illness is not likely to be very severe or complicated, you may want to try a walk in clinic. These are popping up all over the country in pharmacies, drugstores, and shopping centers. They're usually staffed by nurse practitioners or physician assistants that have been trained to treat common illnesses and complaints. They're usually fairly quick and inexpensive. However, if you have serious medical issues or chronic medical problems, these are probably not your best option ° °

## 2015-09-13 NOTE — ED Notes (Signed)
Pt  Is  Requesting   A  rx  For  Diabetic  Test  Strips    She  States  That is  All  She  Needs    She  States  It is  For   [employer]  [employer]  Machine

## 2015-11-07 DIAGNOSIS — E118 Type 2 diabetes mellitus with unspecified complications: Secondary | ICD-10-CM | POA: Diagnosis not present

## 2015-11-07 DIAGNOSIS — Z7189 Other specified counseling: Secondary | ICD-10-CM | POA: Diagnosis not present

## 2015-11-07 DIAGNOSIS — Z794 Long term (current) use of insulin: Secondary | ICD-10-CM | POA: Diagnosis not present

## 2015-11-07 DIAGNOSIS — L84 Corns and callosities: Secondary | ICD-10-CM | POA: Diagnosis not present

## 2015-11-21 DIAGNOSIS — Z794 Long term (current) use of insulin: Secondary | ICD-10-CM | POA: Diagnosis not present

## 2015-11-21 DIAGNOSIS — E7801 Familial hypercholesterolemia: Secondary | ICD-10-CM | POA: Diagnosis not present

## 2015-11-21 DIAGNOSIS — Z1159 Encounter for screening for other viral diseases: Secondary | ICD-10-CM | POA: Diagnosis not present

## 2015-11-21 DIAGNOSIS — I10 Essential (primary) hypertension: Secondary | ICD-10-CM | POA: Diagnosis not present

## 2015-11-21 DIAGNOSIS — E118 Type 2 diabetes mellitus with unspecified complications: Secondary | ICD-10-CM | POA: Diagnosis not present

## 2015-11-27 DIAGNOSIS — E119 Type 2 diabetes mellitus without complications: Secondary | ICD-10-CM | POA: Diagnosis not present

## 2015-11-27 DIAGNOSIS — H25813 Combined forms of age-related cataract, bilateral: Secondary | ICD-10-CM | POA: Diagnosis not present

## 2015-11-27 DIAGNOSIS — H5213 Myopia, bilateral: Secondary | ICD-10-CM | POA: Diagnosis not present

## 2015-11-27 DIAGNOSIS — Z794 Long term (current) use of insulin: Secondary | ICD-10-CM | POA: Diagnosis not present

## 2015-11-27 DIAGNOSIS — H524 Presbyopia: Secondary | ICD-10-CM | POA: Diagnosis not present

## 2015-11-27 DIAGNOSIS — H52223 Regular astigmatism, bilateral: Secondary | ICD-10-CM | POA: Diagnosis not present

## 2015-12-04 ENCOUNTER — Ambulatory Visit (INDEPENDENT_AMBULATORY_CARE_PROVIDER_SITE_OTHER): Payer: Medicare Other | Admitting: Podiatry

## 2015-12-04 ENCOUNTER — Encounter: Payer: Self-pay | Admitting: Podiatry

## 2015-12-04 DIAGNOSIS — E119 Type 2 diabetes mellitus without complications: Secondary | ICD-10-CM

## 2015-12-04 DIAGNOSIS — L608 Other nail disorders: Secondary | ICD-10-CM

## 2015-12-04 DIAGNOSIS — L609 Nail disorder, unspecified: Secondary | ICD-10-CM | POA: Diagnosis not present

## 2015-12-04 DIAGNOSIS — L84 Corns and callosities: Secondary | ICD-10-CM

## 2015-12-04 NOTE — Patient Instructions (Signed)
Diabetes and Foot Care Diabetes may cause you to have problems because of poor blood supply (circulation) to your feet and legs. This may cause the skin on your feet to become thinner, break easier, and heal more slowly. Your skin may become dry, and the skin may peel and crack. You may also have nerve damage in your legs and feet causing decreased feeling in them. You may not notice minor injuries to your feet that could lead to infections or more serious problems. Taking care of your feet is one of the most important things you can do for yourself.  HOME CARE INSTRUCTIONS  Wear shoes at all times, even in the house. Do not go barefoot. Bare feet are easily injured.  Check your feet daily for blisters, cuts, and redness. If you cannot see the bottom of your feet, use a mirror or ask someone for help.  Wash your feet with warm water (do not use hot water) and mild soap. Then pat your feet and the areas between your toes until they are completely dry. Do not soak your feet as this can dry your skin.  Apply a moisturizing lotion or petroleum jelly (that does not contain alcohol and is unscented) to the skin on your feet and to dry, brittle toenails. Do not apply lotion between your toes.  Trim your toenails straight across. Do not dig under them or around the cuticle. File the edges of your nails with an emery board or nail file.  Do not cut corns or calluses or try to remove them with medicine.  Wear clean socks or stockings every day. Make sure they are not too tight. Do not wear knee-high stockings since they may decrease blood flow to your legs.  Wear shoes that fit properly and have enough cushioning. To break in new shoes, wear them for just a few hours a day. This prevents you from injuring your feet. Always look in your shoes before you put them on to be sure there are no objects inside.  Do not cross your legs. This may decrease the blood flow to your feet.  If you find a minor scrape,  cut, or break in the skin on your feet, keep it and the skin around it clean and dry. These areas may be cleansed with mild soap and water. Do not cleanse the area with peroxide, alcohol, or iodine.  When you remove an adhesive bandage, be sure not to damage the skin around it.  If you have a wound, look at it several times a day to make sure it is healing.  Do not use heating pads or hot water bottles. They may burn your skin. If you have lost feeling in your feet or legs, you may not know it is happening until it is too late.  Make sure your health care provider performs a complete foot exam at least annually or more often if you have foot problems. Report any cuts, sores, or bruises to your health care provider immediately. SEEK MEDICAL CARE IF:   You have an injury that is not healing.  You have cuts or breaks in the skin.  You have an ingrown nail.  You notice redness on your legs or feet.  You feel burning or tingling in your legs or feet.  You have pain or cramps in your legs and feet.  Your legs or feet are numb.  Your feet always feel cold. SEEK IMMEDIATE MEDICAL CARE IF:   There is increasing redness,   swelling, or pain in or around a wound.  There is a red line that goes up your leg.  Pus is coming from a wound.  You develop a fever or as directed by your health care provider.  You notice a bad smell coming from an ulcer or wound.   This information is not intended to replace advice given to you by your health care provider. Make sure you discuss any questions you have with your health care provider.   Document Released: 06/19/2000 Document Revised: 02/22/2013 Document Reviewed: 11/29/2012 Elsevier Interactive Patient Education 2016 [employer]  

## 2015-12-05 NOTE — Progress Notes (Signed)
Patient ID: Kelly Richardson, female   DOB: April 21, 1951, 65 y.o.   MRN: [id number]  Subjective: This patient presents today requesting debridement of elongated toenails 10. Last visit and office was 04/16/2015  Type II diabetic No open skin lesions bilaterally Patient denies any history of skin ulceration claudication or amputation   Objective: Vascular: Capillary reflex immediate bilaterally DP and PT pulses 2/4 bilaterally  Neurological: Sensation to 10 g monofilament wire intact 5/5 bilaterally Vibratory sensation intact bilaterally Ankle reflex equal and reactive bilaterally  Dermatological: Elongated, incurvated, normal texture toenails 10 (extremely deformed, discolored) Keratoses medial left hallux  Musculoskeletal: Pes planus bilaterally patient uses roller walker to aid in ambulation   Assessment: Diabetic without complications Incurvated toenails 10  Plan: Debrided toenails 10 mechanically and left great without any bleeding Debrided callus 1 without any bleeding  Reappoint 4 months

## 2015-12-24 DIAGNOSIS — R269 Unspecified abnormalities of gait and mobility: Secondary | ICD-10-CM | POA: Insufficient documentation

## 2015-12-24 DIAGNOSIS — T148 Other injury of unspecified body region: Secondary | ICD-10-CM | POA: Diagnosis not present

## 2016-05-26 ENCOUNTER — Emergency Department (HOSPITAL_COMMUNITY): Payer: Medicare Other

## 2016-05-26 ENCOUNTER — Emergency Department (HOSPITAL_COMMUNITY)
Admission: EM | Admit: 2016-05-26 | Discharge: 2016-05-27 | Disposition: A | Discharge status: Home / Self Care | Payer: Medicare Other | Attending: Emergency Medicine | Admitting: Emergency Medicine

## 2016-05-26 ENCOUNTER — Encounter (HOSPITAL_COMMUNITY): Payer: Self-pay | Admitting: Emergency Medicine

## 2016-05-26 DIAGNOSIS — R112 Nausea with vomiting, unspecified: Secondary | ICD-10-CM | POA: Insufficient documentation

## 2016-05-26 DIAGNOSIS — Z794 Long term (current) use of insulin: Secondary | ICD-10-CM | POA: Diagnosis not present

## 2016-05-26 DIAGNOSIS — I10 Essential (primary) hypertension: Secondary | ICD-10-CM | POA: Diagnosis not present

## 2016-05-26 DIAGNOSIS — R634 Abnormal weight loss: Secondary | ICD-10-CM | POA: Diagnosis not present

## 2016-05-26 DIAGNOSIS — R000 Tachycardia, unspecified: Secondary | ICD-10-CM | POA: Diagnosis not present

## 2016-05-26 DIAGNOSIS — Z7982 Long term (current) use of aspirin: Secondary | ICD-10-CM | POA: Insufficient documentation

## 2016-05-26 DIAGNOSIS — Z96641 Presence of right artificial hip joint: Secondary | ICD-10-CM | POA: Diagnosis not present

## 2016-05-26 DIAGNOSIS — Z79899 Other long term (current) drug therapy: Secondary | ICD-10-CM | POA: Insufficient documentation

## 2016-05-26 DIAGNOSIS — E1165 Type 2 diabetes mellitus with hyperglycemia: Secondary | ICD-10-CM | POA: Insufficient documentation

## 2016-05-26 DIAGNOSIS — E118 Type 2 diabetes mellitus with unspecified complications: Secondary | ICD-10-CM | POA: Diagnosis not present

## 2016-05-26 DIAGNOSIS — K760 Fatty (change of) liver, not elsewhere classified: Secondary | ICD-10-CM | POA: Diagnosis not present

## 2016-05-26 DIAGNOSIS — R531 Weakness: Secondary | ICD-10-CM | POA: Diagnosis not present

## 2016-05-26 DIAGNOSIS — R739 Hyperglycemia, unspecified: Secondary | ICD-10-CM

## 2016-05-26 DIAGNOSIS — R1110 Vomiting, unspecified: Secondary | ICD-10-CM | POA: Diagnosis not present

## 2016-05-26 LAB — COMPREHENSIVE METABOLIC PANEL
ALT: 54 U/L (ref 14–54)
AST: 64 U/L — ABNORMAL HIGH (ref 15–41)
Albumin: 3.1 g/dL — ABNORMAL LOW (ref 3.5–5.0)
Alkaline Phosphatase: 83 U/L (ref 38–126)
Anion gap: 9 (ref 5–15)
BUN: 6 mg/dL (ref 6–20)
CALCIUM: 8.7 mg/dL — AB (ref 8.9–10.3)
CHLORIDE: 98 mmol/L — AB (ref 101–111)
CO2: 25 mmol/L (ref 22–32)
Creatinine, Ser: 0.86 mg/dL (ref 0.44–1.00)
GFR calc non Af Amer: >60 mL/min (ref >60)
Glucose, Bld: 336 mg/dL — ABNORMAL HIGH (ref 65–99)
Potassium: 3.7 mmol/L (ref 3.5–5.1)
SODIUM: 132 mmol/L — AB (ref 135–145)
Total Bilirubin: 0.4 mg/dL (ref 0.3–1.2)
Total Protein: 6.6 g/dL (ref 6.5–8.1)

## 2016-05-26 LAB — I-STAT TROPONIN, ED: TROPONIN I, POC: 0 ng/mL (ref 0.00–0.08)

## 2016-05-26 LAB — URINALYSIS, ROUTINE W REFLEX MICROSCOPIC
Bilirubin Urine: NEGATIVE
Hgb urine dipstick: NEGATIVE
Ketones, ur: 15 mg/dL — AB
Leukocytes, UA: NEGATIVE
Nitrite: NEGATIVE
PROTEIN: NEGATIVE mg/dL
Specific Gravity, Urine: 1.042 — ABNORMAL HIGH (ref 1.005–1.030)
pH: 5 (ref 5.0–8.0)

## 2016-05-26 LAB — URINE MICROSCOPIC-ADD ON: RBC / HPF: NONE SEEN RBC/hpf (ref 0–5)

## 2016-05-26 LAB — CBC WITH DIFFERENTIAL/PLATELET
Basophils Absolute: 0 10*3/uL (ref 0.0–0.1)
Basophils Relative: 0 %
EOS PCT: 2 %
Eosinophils Absolute: 0.1 10*3/uL (ref 0.0–0.7)
HCT: 34.5 % — ABNORMAL LOW (ref 36.0–46.0)
Hemoglobin: 10.2 g/dL — ABNORMAL LOW (ref 12.0–15.0)
Lymphocytes Relative: 34 %
Lymphs Abs: 2.4 10*3/uL (ref 0.7–4.0)
MCH: 20.4 pg — ABNORMAL LOW (ref 26.0–34.0)
MCHC: 29.6 g/dL — ABNORMAL LOW (ref 30.0–36.0)
MCV: 69 fL — AB (ref 78.0–100.0)
Monocytes Absolute: 0.7 10*3/uL (ref 0.1–1.0)
Monocytes Relative: 10 %
Neutro Abs: 3.8 10*3/uL (ref 1.7–7.7)
Neutrophils Relative %: 54 %
Platelets: 338 10*3/uL (ref 150–400)
RBC: 5 MIL/uL (ref 3.87–5.11)
RDW: 16.6 % — ABNORMAL HIGH (ref 11.5–15.5)
WBC: 7 10*3/uL (ref 4.0–10.5)

## 2016-05-26 LAB — CBG MONITORING, ED
GLUCOSE-CAPILLARY: 410 mg/dL — AB (ref 65–99)
GLUCOSE-CAPILLARY: 256 mg/dL — AB (ref 65–99)
Glucose-Capillary: 308 mg/dL — ABNORMAL HIGH (ref 65–99)

## 2016-05-26 LAB — LIPASE, BLOOD: Lipase: 35 U/L (ref 11–51)

## 2016-05-26 MED ORDER — METOCLOPRAMIDE HCL 5 MG/ML IJ SOLN
10.0000 mg | Freq: Once | INTRAMUSCULAR | Status: AC
  Administered 2016-05-26: 10 mg via INTRAVENOUS
  Filled 2016-05-26: qty 2

## 2016-05-26 MED ORDER — SODIUM CHLORIDE 0.9 % IV BOLUS (SEPSIS)
1000.0000 mL | Freq: Once | INTRAVENOUS | Status: AC
  Administered 2016-05-26: 1000 mL via INTRAVENOUS

## 2016-05-26 MED ORDER — ONDANSETRON HCL 4 MG/2ML IJ SOLN
INTRAMUSCULAR | Status: AC
  Filled 2016-05-26: qty 2

## 2016-05-26 MED ORDER — METOCLOPRAMIDE HCL 10 MG PO TABS: 10.0000 mg | ORAL_TABLET | Freq: Four times a day (QID) | ORAL | 0 refills | Status: DC

## 2016-05-26 NOTE — ED Provider Notes (Signed)
[facility] Provider Note   CSN: [id number] Arrival date & time: 05/26/16  1727     History   Chief Complaint Chief Complaint  Patient presents with  . Hyperglycemia    HPI Kelly Richardson is a 65 y.o. female.  HPI Kelly Richardson is a 65 y.o. female with PMH significant for Diabetes on insulin, 5 units NovoLog 3 times a day and Levemir daily at bedtime, who presents with hyperglycemia. Patient states she was at her PCP office today when her blood sugar was 500 and sent her here. She denies any symptoms. Patient states that she was seeing her PCP for 3 day history of vomiting only with eating and states she just hasn't felt well when she's up and moving around.  She denies any fever, chills, cough, hematemesis, chest pain, shortness of breath, abdominal pain, diarrhea, constipation, bloody stools, or urinary symptoms. No prior abdominal surgeries. She states she checks her blood sugar when she remembers and that it was 200 this morning. She denies any missed insulin doses.  Past Medical History:  Diagnosis Date  . Arthritis   . Complication of anesthesia   . Diabetes mellitus without complication (HCC)   . Heart murmur    as a child  . Hypertension   . PONV (postoperative nausea and vomiting)   . Seizures (HCC)    reaction from medication- nmone since 1987  . Shortness of breath dyspnea    sometimes  at night    Patient Active Problem List   Diagnosis Date Noted  . Health care maintenance 04/09/2015  . H/O total hip arthroplasty 11/09/2014  . Osteomyelitis of right hip (HCC) 07/12/2014  . Chest pain 06/22/2014  . Anemia due to blood loss 06/20/2014  . Status post revision of total hip replacement 06/08/2014  . Anxiety state 03/05/2008  . FATIGUE 03/05/2008  . Uncontrolled insulin dependent diabetes mellitus (HCC) 01/02/2008  . Hyperlipidemia 01/02/2008  . Essential hypertension 01/02/2008    Past Surgical History:  Procedure Laterality Date  . ABDOMINAL  HYSTERECTOMY    . CHOLECYSTECTOMY  1995  . COLONOSCOPY    . EXCISIONAL TOTAL HIP ARTHROPLASTY WITH ANTIBIOTIC SPACERS Right 09/07/2014   Procedure: Excision Total Hip Arthroplasty, Place Antibiotic Spacer;  Surgeon: Nadara MustardMarcus Duda V, MD;  Location: [facility];  Service: Orthopedics;  Laterality: Right;  . INCISION AND DRAINAGE HIP Right 06/08/2014   Procedure: Irrigation and Debridement Right Hip, Revision of Cup and Head of Total Hip with  Placement  of Antibiotic Spacer;  Surgeon: Nadara MustardMarcus Duda V, MD;  [facility]: [facility];  Service: Orthopedics;  Laterality: Right;  . TONSILLECTOMY    . TOTAL HIP ARTHROPLASTY Right 2011  . TOTAL HIP REVISION Right 11/09/2014   Procedure: TOTAL HIP REVISION;  Surgeon: Nadara MustardMarcus Duda V, MD;  [facility]: [facility];  Service: Orthopedics;  Laterality: Right;    OB History    No data available       Home Medications    Prior to Admission medications   Medication Sig Start Date End Date Taking? Authorizing Provider  aspirin EC 81 MG tablet Take 81 mg by mouth daily.   Yes Historical Provider, MD  insulin aspart (NOVOLOG) 100 UNIT/ML injection Inject 5 Units into the skin 3 (three) times daily before meals. Ac meals for cbg>=150   Yes Historical Provider, MD  LEVEMIR FLEXTOUCH 100 UNIT/ML SOPN Inject 40-45 Units into the skin at bedtime.  03/02/13  Yes Historical Provider, MD  METFORMIN HCL PO Take 1 tablet  by mouth every evening.   Yes Historical Provider, MD  acetaminophen (TYLENOL) 325 MG tablet Take 650 mg by mouth every 6 (six) hours as needed for moderate pain.     Historical Provider, MD  metoCLOPramide (REGLAN) 10 MG tablet Take 1 tablet (10 mg total) by mouth every 6 (six) hours. 05/26/16   Cheri Fowler, PA-C    Family History No family history on file.  Social History Social History  Substance Use Topics  . Smoking status: Never Smoker  . Smokeless tobacco: Never Used  . Alcohol use No     Allergies   Metformin; Penicillins; Seldane [terfenadine]; Sulfonamide  derivatives; Vicks formula 44 cough relief [dextromethorphan hbr]; Xanax [alprazolam]; Iohexol; Latex; Other; and Percocet [oxycodone-acetaminophen]   Review of Systems Review of Systems All other systems negative unless otherwise stated in HPI   Physical Exam Updated Vital Signs BP 139/92   Pulse 104   Temp 98.3 F (36.8 C) (Oral)   Resp 20   SpO2 99%   Physical Exam  Constitutional: She is oriented to person, place, and time. She appears well-developed and well-nourished.  Non-toxic appearance. She does not have a sickly appearance. She does not appear ill.  HENT:  Head: Normocephalic and atraumatic.  Mouth/Throat: Oropharynx is clear and moist.  Eyes: Conjunctivae are normal. Pupils are equal, round, and reactive to light.  Neck: Normal range of motion. Neck supple.  Cardiovascular: Normal rate and regular rhythm.   Pulmonary/Chest: Effort normal and breath sounds normal. No accessory muscle usage or stridor. No respiratory distress. She has no wheezes. She has no rhonchi. She has no rales.  Abdominal: Soft. Bowel sounds are normal. She exhibits no distension. There is no tenderness. There is no rebound and no guarding.  Musculoskeletal: Normal range of motion.  Lymphadenopathy:    She has no cervical adenopathy.  Neurological: She is alert and oriented to person, place, and time.  Speech clear without dysarthria.  Skin: Skin is warm and dry.  Psychiatric: She has a normal mood and affect. Her behavior is normal.     ED Treatments / Results  Labs (all labs ordered are listed, but only abnormal results are displayed) Labs Reviewed  COMPREHENSIVE METABOLIC PANEL - Abnormal; Notable for the following:       Result Value   Sodium 132 (*)    Chloride 98 (*)    Glucose, Bld 336 (*)    Calcium 8.7 (*)    Albumin 3.1 (*)    AST 64 (*)    All other components within normal limits  CBC WITH DIFFERENTIAL/PLATELET - Abnormal; Notable for the following:    Hemoglobin 10.2 (*)     HCT 34.5 (*)    MCV 69.0 (*)    MCH 20.4 (*)    MCHC 29.6 (*)    RDW 16.6 (*)    All other components within normal limits  URINALYSIS, ROUTINE W REFLEX MICROSCOPIC (NOT AT [facility]) - Abnormal; Notable for the following:    Specific Gravity, Urine 1.042 (*)    Glucose, UA >1000 (*)    Ketones, ur 15 (*)    All other components within normal limits  URINE MICROSCOPIC-ADD ON - Abnormal; Notable for the following:    Squamous Epithelial / LPF 6-30 (*)    Bacteria, UA RARE (*)    All other components within normal limits  CBG MONITORING, ED - Abnormal; Notable for the following:    Glucose-Capillary 410 (*)    All other components within normal  limits  CBG MONITORING, ED - Abnormal; Notable for the following:    Glucose-Capillary 308 (*)    All other components within normal limits  CBG MONITORING, ED - Abnormal; Notable for the following:    Glucose-Capillary 256 (*)    All other components within normal limits  LIPASE, BLOOD  I-STAT TROPOININ, ED    EKG  EKG Interpretation None      ED ECG REPORT   Date: 05/27/2016  Rate: 108   Rhythm: sinus tachycardia  QRS Axis: normal  Intervals: normal  ST/T Wave abnormalities: abnormal R wave progression, early transition.  Borderline T wave abnormalities, diffuse leads  Conduction Disutrbances:none  Narrative Interpretation:   Old EKG Reviewed: none available  I have personally reviewed the EKG tracing and agree with the computerized printout as noted.   Radiology Dg Chest 2 View  Result Date: 05/26/2016 CLINICAL DATA:  65 year old female with hyperglycemia. Vomiting for 3 days. Initial encounter. EXAM: CHEST  2 VIEW COMPARISON:  Chest radiographs 04/20/2012. FINDINGS: Large body habitus. Normal lung volumes. Normal cardiac size and mediastinal contours. Visualized tracheal air column is within normal limits. The lungs are clear. No pneumothorax, pneumoperitoneum, or pleural effusion. Visualized bowel gas pattern is non  obstructed. Stable cholecystectomy clips. No acute osseous abnormality identified. IMPRESSION: Negative.  No acute cardiopulmonary abnormality. Electronically Signed   By: Odessa Fleming M.D.   On: 05/26/2016 19:28   US Abdomen Limited Ruq  Result Date: 05/26/2016 CLINICAL DATA:  Vomiting today. Elevated sugar. History of hypertension, diabetes, obesity, previous cholecystectomy. EXAM: US ABDOMEN LIMITED - RIGHT UPPER QUADRANT COMPARISON:  None. FINDINGS: Gallbladder: Gallbladder is surgically absent. Common bile duct: Diameter: 4 mm, normal Liver: Diffusely increased hepatic parenchymal echotexture likely representing fatty infiltration. No focal lesions identified. IMPRESSION: Surgically absent gallbladder. No bile duct dilatation. Diffuse fatty infiltration of the liver. Electronically Signed   By: Burman Nieves M.D.   On: 05/26/2016 21:40    Procedures Procedures (including critical care time)  Medications Ordered in ED Medications  sodium chloride 0.9 % bolus 1,000 mL (0 mLs Intravenous Stopped 05/26/16 2223)  sodium chloride 0.9 % bolus 1,000 mL (0 mLs Intravenous Stopped 05/26/16 2223)  metoCLOPramide (REGLAN) injection 10 mg (10 mg Intravenous Given 05/26/16 2311)     Initial Impression / Assessment and Plan / ED Course  I have reviewed the triage vital signs and the nursing notes.  Pertinent labs & imaging results that were available during my care of the patient were reviewed by me and considered in my medical decision making (see chart for details).  Clinical Course    Patient sent from her PCP office for hyperglycemia. She currently denies any symptoms. She was going to her PCP for evaluation of vomiting after eating. He denies prior abdominal surgeries. She was initially tachycardic on arrival, likely secondary to hyperglycemia. This improved with 2 L of fluid. Initial CBG 410, and this also improved after 2 L of fluid to use 256. No signs of DKA on her labs. Right upper quadrant  ultrasound obtained to evaluate for cholecystitis, she has a surgically absent gallbladder. No acute abnormalities. Low suspicion for ACS, troponin negative, EKG without ischemia, CXR unremarkable.  Do not suspect acute intra-abdominal etiology.  UA without infection.  Patient became nauseated and vomiting after initial PO challenge.  She received Reglan with much improvement, and now tolerating PO without difficulty.  Plan to discharge home with Reglan and close PCP follow up.  Return precautions discussed.  Stable for discharge.  Case has been discussed with Dr. Donnald GarrePfeiffer who agrees with the above plan for discharge.    Final Clinical Impressions(s) / ED Diagnoses   Final diagnoses:  Vomiting  Hyperglycemia  Non-intractable vomiting with nausea, unspecified vomiting type    New Prescriptions New Prescriptions   METOCLOPRAMIDE (REGLAN) 10 MG TABLET    Take 1 tablet (10 mg total) by mouth every 6 (six) hours.     Cheri FowlerKayla Cydney Alvarenga, PA-C 05/27/16 0008    Arby BarretteMarcy Pfeiffer, MD 05/28/16 [phone]

## 2016-05-26 NOTE — ED Notes (Signed)
Called X-ray to notify patient ready.

## 2016-05-26 NOTE — Discharge Instructions (Addendum)
Continue taking your insulin as prescribed.  Start taking Reglan every 6 hours as needed for nausea and vomiting. Follow up with your primary care physician.  Return to the ED if you are unable to keep fluids or foods down, experience abdominal pain, fever, or any new or concerning symptoms.

## 2016-05-26 NOTE — ED Notes (Signed)
Patient transported to Ultrasound 

## 2016-05-26 NOTE — ED Triage Notes (Signed)
Patient comes from PCP due to CBG 500. Per Patient she has been vomiting x3 days. Patient alert and and oriented x4 moving all extremities. Patient state, " I check my sugar when I remember". Patient states she checked her sugar this morning it was 200 and gave herself 5 unites/. CBG on arrival 410.patient denies any pain, SOB

## 2016-05-27 DIAGNOSIS — R531 Weakness: Secondary | ICD-10-CM | POA: Diagnosis not present

## 2016-05-27 DIAGNOSIS — E162 Hypoglycemia, unspecified: Secondary | ICD-10-CM | POA: Diagnosis not present

## 2016-07-14 ENCOUNTER — Encounter (HOSPITAL_COMMUNITY): Payer: Self-pay

## 2016-07-14 ENCOUNTER — Emergency Department (HOSPITAL_COMMUNITY)
Admission: EM | Admit: 2016-07-14 | Discharge: 2016-07-15 | Disposition: A | Discharge status: Home / Self Care | Payer: Medicare Other | Attending: Emergency Medicine | Admitting: Emergency Medicine

## 2016-07-14 DIAGNOSIS — E7801 Familial hypercholesterolemia: Secondary | ICD-10-CM | POA: Diagnosis not present

## 2016-07-14 DIAGNOSIS — Z96641 Presence of right artificial hip joint: Secondary | ICD-10-CM | POA: Diagnosis not present

## 2016-07-14 DIAGNOSIS — I10 Essential (primary) hypertension: Secondary | ICD-10-CM | POA: Diagnosis not present

## 2016-07-14 DIAGNOSIS — E1165 Type 2 diabetes mellitus with hyperglycemia: Secondary | ICD-10-CM | POA: Diagnosis not present

## 2016-07-14 DIAGNOSIS — Z91040 Latex allergy status: Secondary | ICD-10-CM | POA: Diagnosis not present

## 2016-07-14 DIAGNOSIS — R460 Very low level of personal hygiene: Secondary | ICD-10-CM | POA: Diagnosis not present

## 2016-07-14 DIAGNOSIS — Z794 Long term (current) use of insulin: Secondary | ICD-10-CM | POA: Diagnosis not present

## 2016-07-14 DIAGNOSIS — R413 Other amnesia: Secondary | ICD-10-CM | POA: Diagnosis not present

## 2016-07-14 DIAGNOSIS — R112 Nausea with vomiting, unspecified: Secondary | ICD-10-CM | POA: Diagnosis not present

## 2016-07-14 DIAGNOSIS — R739 Hyperglycemia, unspecified: Secondary | ICD-10-CM | POA: Diagnosis not present

## 2016-07-14 LAB — URINALYSIS, ROUTINE W REFLEX MICROSCOPIC
Bilirubin Urine: NEGATIVE
Hgb urine dipstick: NEGATIVE
Ketones, ur: 5 mg/dL — AB
Leukocytes, UA: NEGATIVE
NITRITE: NEGATIVE
PH: 5 (ref 5.0–8.0)
PROTEIN: NEGATIVE mg/dL
SPECIFIC GRAVITY, URINE: 1.012 (ref 1.005–1.030)

## 2016-07-14 LAB — BASIC METABOLIC PANEL
ANION GAP: 8 (ref 5–15)
BUN: 12 mg/dL (ref 6–20)
CALCIUM: 9.1 mg/dL (ref 8.9–10.3)
CO2: 27 mmol/L (ref 22–32)
CREATININE: 0.89 mg/dL (ref 0.44–1.00)
Chloride: 101 mmol/L (ref 101–111)
Glucose, Bld: 272 mg/dL — ABNORMAL HIGH (ref 65–99)
Potassium: 3.9 mmol/L (ref 3.5–5.1)
SODIUM: 136 mmol/L (ref 135–145)

## 2016-07-14 LAB — CBG MONITORING, ED: GLUCOSE-CAPILLARY: 320 mg/dL — AB (ref 65–99)

## 2016-07-14 LAB — CBC WITH DIFFERENTIAL/PLATELET
BASOS ABS: 0 10*3/uL (ref 0.0–0.1)
Basophils Relative: 0 %
EOS ABS: 0.1 10*3/uL (ref 0.0–0.7)
Eosinophils Relative: 1 %
HCT: 32.5 % — ABNORMAL LOW (ref 36.0–46.0)
Hemoglobin: 9.5 g/dL — ABNORMAL LOW (ref 12.0–15.0)
LYMPHS ABS: 2 10*3/uL (ref 0.7–4.0)
Lymphocytes Relative: 29 %
MCH: 19.4 pg — ABNORMAL LOW (ref 26.0–34.0)
MCHC: 29.2 g/dL — AB (ref 30.0–36.0)
MCV: 66.5 fL — ABNORMAL LOW (ref 78.0–100.0)
Monocytes Absolute: 0.5 10*3/uL (ref 0.1–1.0)
Monocytes Relative: 7 %
NEUTROS ABS: 4.4 10*3/uL (ref 1.7–7.7)
Neutrophils Relative %: 63 %
Platelets: 315 10*3/uL (ref 150–400)
RBC: 4.89 MIL/uL (ref 3.87–5.11)
RDW: 16.2 % — AB (ref 11.5–15.5)
WBC: 7 10*3/uL (ref 4.0–10.5)

## 2016-07-14 MED ORDER — SODIUM CHLORIDE 0.9 % IV BOLUS (SEPSIS)
1000.0000 mL | Freq: Once | INTRAVENOUS | Status: AC
  Administered 2016-07-14: 1000 mL via INTRAVENOUS

## 2016-07-14 NOTE — ED Provider Notes (Signed)
[facility] Provider Note   CSN: [id number] Arrival date & time: 07/14/16  1808     History   Chief Complaint Chief Complaint  Patient presents with  . Hyperglycemia  . Hypotension    HPI Kelly Richardson is a 66 y.o. female.   Hyperglycemia  Blood sugar level PTA:  >400 Severity:  Moderate Onset quality:  Gradual Duration:  2 weeks Timing:  Constant Progression:  Unchanged Chronicity:  Chronic Diabetes status:  Controlled with insulin and controlled with oral medications Context: not change in medication and not recent illness   Relieved by:  None tried Ineffective treatments:  None tried Associated symptoms: no abdominal pain, no dizziness, no polyuria and no shortness of breath     Past Medical History:  Diagnosis Date  . Arthritis   . Complication of anesthesia   . Diabetes mellitus without complication (HCC)   . Heart murmur    as a child  . Hypertension   . PONV (postoperative nausea and vomiting)   . Seizures (HCC)   . Shortness of breath dyspnea    sometimes  at night    Patient Active Problem List   Diagnosis Date Noted  . Health care maintenance 04/09/2015  . H/O total hip arthroplasty 11/09/2014  . Osteomyelitis of right hip (HCC) 07/12/2014  . Chest pain 06/22/2014  . Anemia due to blood loss 06/20/2014  . Status post revision of total hip replacement 06/08/2014  . Anxiety state 03/05/2008  . FATIGUE 03/05/2008  . Uncontrolled insulin dependent diabetes mellitus (HCC) 01/02/2008  . Hyperlipidemia 01/02/2008  . Essential hypertension 01/02/2008    Past Surgical History:  Procedure Laterality Date  . ABDOMINAL HYSTERECTOMY    . CHOLECYSTECTOMY  1995  . COLONOSCOPY    . EXCISIONAL TOTAL HIP ARTHROPLASTY WITH ANTIBIOTIC SPACERS Right 09/07/2014   Procedure: Excision Total Hip Arthroplasty, Place Antibiotic Spacer;  Surgeon: Nadara Mustard, MD;  [facility];  Service: Orthopedics;  Laterality: Right;  . INCISION AND DRAINAGE  HIP Right 06/08/2014   Procedure: Irrigation and Debridement Right Hip, Revision of Cup and Head of Total Hip with  Placement  of Antibiotic Spacer;  Surgeon: Nadara Mustard, MD;  Location: [facility];  Service: Orthopedics;  Laterality: Right;  . TONSILLECTOMY    . TOTAL HIP ARTHROPLASTY Right 2011  . TOTAL HIP REVISION Right 11/09/2014   Procedure: TOTAL HIP REVISION;  Surgeon: Nadara Mustard, MD;  Location: [facility];  Service: Orthopedics;  Laterality: Right;    OB History    No data available       Home Medications    Prior to Admission medications   Medication Sig Start Date End Date Taking? Authorizing Provider  insulin aspart (NOVOLOG) 100 UNIT/ML injection Inject 5 Units into the skin 3 (three) times daily before meals. Ac meals for cbg>=150   Yes Historical Provider, MD  LEVEMIR FLEXTOUCH 100 UNIT/ML SOPN Inject 40 Units into the skin at bedtime.  03/02/13  Yes Historical Provider, MD    Family History History reviewed. No pertinent family history.  Social History Social History  Substance Use Topics  . Smoking status: Never Smoker  . Smokeless tobacco: Never Used  . Alcohol use No     Allergies   Metformin; Penicillins; Seldane [terfenadine]; Sulfonamide derivatives; Vicks formula 44 cough relief [dextromethorphan hbr]; Xanax [alprazolam]; Iohexol; Latex; Other; and Percocet [oxycodone-acetaminophen]   Review of Systems Review of Systems  HENT: Negative for congestion.   Respiratory: Negative for shortness of  breath.   Gastrointestinal: Negative for abdominal pain.  Endocrine: Negative for polyuria.  Neurological: Negative for dizziness.  All other systems reviewed and are negative.    Physical Exam Updated Vital Signs BP 109/67   Pulse 95   Resp 14   Ht 5\' 2"  (1.575 m)   Wt 193 lb (87.5 kg)   SpO2 96%   BMI 35.30 kg/m   Physical Exam  Constitutional: She is oriented to person, place, and time. She appears well-developed and well-nourished.  HENT:  Head:  Normocephalic and atraumatic.  Eyes: Conjunctivae and EOM are normal.  Neck: Normal range of motion.  Cardiovascular: Normal rate and regular rhythm.   Pulmonary/Chest: Effort normal. No stridor. No respiratory distress. She has no wheezes.  Abdominal: She exhibits no distension. There is no tenderness.  Musculoskeletal: Normal range of motion. She exhibits no edema or deformity.  Neurological: She is alert and oriented to person, place, and time. No cranial nerve deficit.  Nursing note and vitals reviewed.    ED Treatments / Results  Labs (all labs ordered are listed, but only abnormal results are displayed) Labs Reviewed  CBC WITH DIFFERENTIAL/PLATELET - Abnormal; Notable for the following:       Result Value   Hemoglobin 9.5 (*)    HCT 32.5 (*)    MCV 66.5 (*)    MCH 19.4 (*)    MCHC 29.2 (*)    RDW 16.2 (*)    All other components within normal limits  BASIC METABOLIC PANEL - Abnormal; Notable for the following:    Glucose, Bld 272 (*)    All other components within normal limits  URINALYSIS, ROUTINE W REFLEX MICROSCOPIC - Abnormal; Notable for the following:    Glucose, UA >=500 (*)    Ketones, ur 5 (*)    Bacteria, UA RARE (*)    Squamous Epithelial / LPF 0-5 (*)    All other components within normal limits  CBG MONITORING, ED - Abnormal; Notable for the following:    Glucose-Capillary 320 (*)    All other components within normal limits    EKG  EKG Interpretation  Date/Time:  Tuesday July 14 2016 19:21:55 EST Ventricular Rate:  100 PR Interval:    QRS Duration: 98 QT Interval:  354 QTC Calculation: 457 R Axis:   51 Text Interpretation:  Sinus tachycardia Low voltage, precordial leads Confirmed by [facility], Barbara CowerJASON ([id number]) on 07/14/2016 7:49:08 PM Also confirmed by [facility], Brigetta Beckstrom [phone]), editor [place]  CCT, BEVERLY ([id number])  on 07/15/2016 6:57:22 AM       Radiology No results found.  Procedures Procedures (including critical care  time)  Medications Ordered in ED Medications  sodium chloride 0.9 % bolus 1,000 mL (0 mLs Intravenous Stopped 07/14/16 2145)  sodium chloride 0.9 % bolus 1,000 mL (0 mLs Intravenous Stopped 07/14/16 2320)     Initial Impression / Assessment and Plan / ED Course  I have reviewed the triage vital signs and the nursing notes.  Pertinent labs & imaging results that were available during my care of the patient were reviewed by me and considered in my medical decision making (see chart for details).  Clinical Course     Patient with one episode of Borderline low blood pressure here by suspect this was erroneous suspicion is that it was absolutely normal. The low blood pressure she hadn't done. This was after an episode of vomiting suspect is more of a vagal response than anything. Her blood sugar here is near  her baseline. No evidence of DKA. I is asymptomatic and is stable for discharge home.  Final Clinical Impressions(s) / ED Diagnoses   Final diagnoses:  Hyperglycemia    New Prescriptions Discharge Medication List as of 07/14/2016 11:56 PM       Marily Memos, MD 07/15/16 2139

## 2016-07-14 NOTE — ED Notes (Signed)
First attempted, PT did not answer to name for triage

## 2016-07-14 NOTE — ED Triage Notes (Signed)
Per EMS- patient was picked [facility] at [facility] and the patient was there for a routine visit. CBG was 442 at the physicians office. She was given novolog 6 units [place] prior to EMS arrival. Patient vomited multiple times. CBG for EMS- 386.

## 2016-07-15 DIAGNOSIS — R4182 Altered mental status, unspecified: Secondary | ICD-10-CM | POA: Diagnosis not present

## 2016-07-15 DIAGNOSIS — R531 Weakness: Secondary | ICD-10-CM | POA: Diagnosis not present

## 2016-07-15 NOTE — ED Notes (Signed)
[facility] contacted for transport 

## 2016-10-23 ENCOUNTER — Telehealth: Payer: Self-pay

## 2016-10-23 NOTE — Telephone Encounter (Signed)
I left a message asking the patient to confirm if she's seeing a PCP. Kelly Richardson ([facility])

## 2016-10-23 NOTE — Telephone Encounter (Signed)
I called to confirm PCP, but there was no answer and no option to leave a voicemail message. Delton Prairie ([facility])

## 2016-12-08 ENCOUNTER — Encounter (HOSPITAL_COMMUNITY): Payer: Self-pay | Admitting: *Deleted

## 2016-12-08 ENCOUNTER — Other Ambulatory Visit: Payer: Self-pay | Admitting: Adult Health

## 2016-12-08 ENCOUNTER — Emergency Department (HOSPITAL_COMMUNITY)
Admission: EM | Admit: 2016-12-08 | Discharge: 2016-12-09 | Disposition: A | Discharge status: Home / Self Care | Payer: Medicare Other | Attending: Emergency Medicine | Admitting: Emergency Medicine

## 2016-12-08 DIAGNOSIS — H538 Other visual disturbances: Secondary | ICD-10-CM | POA: Insufficient documentation

## 2016-12-08 DIAGNOSIS — I10 Essential (primary) hypertension: Secondary | ICD-10-CM | POA: Insufficient documentation

## 2016-12-08 DIAGNOSIS — R000 Tachycardia, unspecified: Secondary | ICD-10-CM | POA: Diagnosis not present

## 2016-12-08 DIAGNOSIS — E1165 Type 2 diabetes mellitus with hyperglycemia: Secondary | ICD-10-CM | POA: Insufficient documentation

## 2016-12-08 DIAGNOSIS — H539 Unspecified visual disturbance: Secondary | ICD-10-CM

## 2016-12-08 DIAGNOSIS — Z91040 Latex allergy status: Secondary | ICD-10-CM | POA: Insufficient documentation

## 2016-12-08 DIAGNOSIS — Z794 Long term (current) use of insulin: Secondary | ICD-10-CM | POA: Diagnosis not present

## 2016-12-08 DIAGNOSIS — Z96641 Presence of right artificial hip joint: Secondary | ICD-10-CM | POA: Diagnosis not present

## 2016-12-08 DIAGNOSIS — I6789 Other cerebrovascular disease: Secondary | ICD-10-CM | POA: Diagnosis not present

## 2016-12-08 DIAGNOSIS — R739 Hyperglycemia, unspecified: Secondary | ICD-10-CM

## 2016-12-08 LAB — BASIC METABOLIC PANEL
Anion gap: 11 (ref 5–15)
BUN: 15 mg/dL (ref 6–20)
CHLORIDE: 100 mmol/L — AB (ref 101–111)
CO2: 24 mmol/L (ref 22–32)
Calcium: 9.4 mg/dL (ref 8.9–10.3)
Creatinine, Ser: 0.82 mg/dL (ref 0.44–1.00)
GFR calc non Af Amer: >60 mL/min (ref >60)
Glucose, Bld: 310 mg/dL — ABNORMAL HIGH (ref 65–99)
Potassium: 3.8 mmol/L (ref 3.5–5.1)
SODIUM: 135 mmol/L (ref 135–145)

## 2016-12-08 LAB — URINALYSIS, ROUTINE W REFLEX MICROSCOPIC
BILIRUBIN URINE: NEGATIVE
Glucose, UA: >=500 mg/dL — AB
Hgb urine dipstick: NEGATIVE
KETONES UR: 5 mg/dL — AB
Nitrite: NEGATIVE
PH: 5 (ref 5.0–8.0)
Protein, ur: NEGATIVE mg/dL
Specific Gravity, Urine: 1.023 (ref 1.005–1.030)

## 2016-12-08 LAB — CBC
HCT: 33.5 % — ABNORMAL LOW (ref 36.0–46.0)
Hemoglobin: 9.5 g/dL — ABNORMAL LOW (ref 12.0–15.0)
MCH: 18 pg — ABNORMAL LOW (ref 26.0–34.0)
MCHC: 28.4 g/dL — ABNORMAL LOW (ref 30.0–36.0)
MCV: 63.3 fL — AB (ref 78.0–100.0)
Platelets: 331 10*3/uL (ref 150–400)
RBC: 5.29 MIL/uL — ABNORMAL HIGH (ref 3.87–5.11)
RDW: 17.3 % — ABNORMAL HIGH (ref 11.5–15.5)
WBC: 9.9 10*3/uL (ref 4.0–10.5)

## 2016-12-08 LAB — CBG MONITORING, ED: Glucose-Capillary: 330 mg/dL — ABNORMAL HIGH (ref 65–99)

## 2016-12-08 MED ORDER — TROPICAMIDE 0.5 % OP SOLN
2.0000 [drp] | Freq: Once | OPHTHALMIC | Status: AC
  Administered 2016-12-08: 2 [drp] via OPHTHALMIC
  Filled 2016-12-08: qty 15

## 2016-12-08 MED ORDER — INSULIN ASPART 100 UNIT/ML ~~LOC~~ SOLN
4.0000 [IU] | Freq: Once | SUBCUTANEOUS | Status: AC
  Administered 2016-12-09: 4 [IU] via SUBCUTANEOUS
  Filled 2016-12-08: qty 1

## 2016-12-08 MED ORDER — INSULIN ASPART 100 UNIT/ML ~~LOC~~ SOLN: 5.0000 [IU] | Freq: Three times a day (TID) | SUBCUTANEOUS | Status: DC

## 2016-12-08 MED ORDER — SODIUM CHLORIDE 0.9 % IV BOLUS (SEPSIS)
1000.0000 mL | Freq: Once | INTRAVENOUS | Status: AC
  Administered 2016-12-09: 1000 mL via INTRAVENOUS

## 2016-12-08 NOTE — ED Provider Notes (Signed)
[facility]-EMERGENCY DEPT Provider Note   CSN: [id number] Arrival date & time: 12/08/16  [id number]     History   Chief Complaint Chief Complaint  Patient presents with  . Blurred Vision    HPI Kelly Richardson is a 66 y.o. female.  66 year old female presents with bilateral vision loss. Symptoms began about a week ago in her right eye which she characterizes losing vision gradually. Today she had more of an acute onset of vision loss in the left eye. Characterizes as if looking through a plastic bag. Denies any pain in her eyes. No recent drainage from the eyes. Does have a remote history of cataracts. His abdomen seen by a surgeon. Denies any headache or temporal pain. No vomiting. No focal neurological weakness. Nothing makes her symptoms better. No treatment use prior to arrival.      Past Medical History:  Diagnosis Date  . Arthritis   . Complication of anesthesia   . Diabetes mellitus without complication (HCC)   . Heart murmur    as a child  . Hypertension   . PONV (postoperative nausea and vomiting)   . Seizures (HCC)   . Shortness of breath dyspnea    sometimes  at night    Patient Active Problem List   Diagnosis Date Noted  . Health care maintenance 04/09/2015  . H/O total hip arthroplasty 11/09/2014  . Osteomyelitis of right hip (HCC) 07/12/2014  . Chest pain 06/22/2014  . Anemia due to blood loss 06/20/2014  . Status post revision of total hip replacement 06/08/2014  . Anxiety state 03/05/2008  . FATIGUE 03/05/2008  . Uncontrolled insulin dependent diabetes mellitus (HCC) 01/02/2008  . Hyperlipidemia 01/02/2008  . Essential hypertension 01/02/2008    Past Surgical History:  Procedure Laterality Date  . ABDOMINAL HYSTERECTOMY    . CHOLECYSTECTOMY  1995  . COLONOSCOPY    . EXCISIONAL TOTAL HIP ARTHROPLASTY WITH ANTIBIOTIC SPACERS Right 09/07/2014   Procedure: Excision Total Hip Arthroplasty, Place Antibiotic Spacer;  Surgeon: Nadara MustardMarcus Duda V, MD;  Location[facility];   Service: Orthopedics;  Laterality: Right;  . INCISION AND DRAINAGE HIP Right 06/08/2014   Procedure: Irrigation and Debridement Right Hip, Revision of Cup and Head of Total Hip with  Placement  of Antibiotic Spacer;  Surgeon: Nadara MustardMarcus Duda V, MD;  Location: [facility];  Service: Orthopedics;  Laterality: Right;  . TONSILLECTOMY    . TOTAL HIP ARTHROPLASTY Right 2011  . TOTAL HIP REVISION Right 11/09/2014   Procedure: TOTAL HIP REVISION;  Surgeon: Nadara MustardMarcus Duda V, MD;  Location: [facility];  Service: Orthopedics;  Laterality: Right;    OB History    No data available       Home Medications    Prior to Admission medications   Medication Sig Start Date End Date Taking? Authorizing Provider  insulin aspart (NOVOLOG) 100 UNIT/ML injection Inject 5 Units into the skin 3 (three) times daily before meals. Ac meals for cbg>=150   Yes [provider]  LEVEMIR FLEXTOUCH 100 UNIT/ML SOPN Inject 40 Units into the skin at bedtime.  03/02/13   [provider]    Family History History reviewed. No pertinent family history.  Social History Social History  Substance Use Topics  . Smoking status: Never Smoker  . Smokeless tobacco: Never Used  . Alcohol use No     Allergies   Metformin; Penicillins; Seldane [terfenadine]; Sulfonamide derivatives; Vicks formula 44 cough relief [dextromethorphan hbr]; Xanax [alprazolam]; Iohexol; Latex; Other; and Percocet [oxycodone-acetaminophen]   Review  of Systems Review of Systems  All other systems reviewed and are negative.    Physical Exam Updated Vital Signs BP (!) 144/82 (BP Location: Right Arm)   Pulse (!) 105   Temp 98 F (36.7 C) (Oral)   Resp 20   Ht 1.575 m (5\' 2" )   Wt 83.9 kg (185 lb)   SpO2 93%   BMI 33.84 kg/m   Physical Exam  Constitutional: She is oriented to person, place, and time. She appears well-developed and well-nourished.  Non-toxic appearance. No distress.  HENT:  Head: Normocephalic and atraumatic.  Eyes:  Conjunctivae, EOM and lids are normal. Pupils are equal, round, and reactive to light.  Fundoscopic exam:      The right eye shows no red reflex.       The left eye shows no red reflex.  Neck: Normal range of motion. Neck supple. No tracheal deviation present. No thyroid mass present.  Cardiovascular: Normal rate, regular rhythm and normal heart sounds.  Exam reveals no gallop.   No murmur heard. Pulmonary/Chest: Effort normal and breath sounds normal. No stridor. No respiratory distress. She has no decreased breath sounds. She has no wheezes. She has no rhonchi. She has no rales.  Abdominal: Soft. Normal appearance and bowel sounds are normal. She exhibits no distension. There is no tenderness. There is no rebound and no CVA tenderness.  Musculoskeletal: Normal range of motion. She exhibits no edema or tenderness.  Neurological: She is alert and oriented to person, place, and time. She has normal strength. No cranial nerve deficit or sensory deficit. GCS eye subscore is 4. GCS verbal subscore is 5. GCS motor subscore is 6.  Skin: Skin is warm and dry. No abrasion and no rash noted.  Psychiatric: She has a normal mood and affect. Her speech is normal and behavior is normal.  Nursing note and vitals reviewed.    ED Treatments / Results  Labs (all labs ordered are listed, but only abnormal results are displayed) Labs Reviewed  BASIC METABOLIC PANEL - Abnormal; Notable for the following:       Result Value   Chloride 100 (*)    Glucose, Bld 310 (*)    All other components within normal limits  CBC - Abnormal; Notable for the following:    RBC 5.29 (*)    Hemoglobin 9.5 (*)    HCT 33.5 (*)    MCV 63.3 (*)    MCH 18.0 (*)    MCHC 28.4 (*)    RDW 17.3 (*)    All other components within normal limits  URINALYSIS, ROUTINE W REFLEX MICROSCOPIC - Abnormal; Notable for the following:    APPearance HAZY (*)    Glucose, UA >=500 (*)    Ketones, ur 5 (*)    Leukocytes, UA SMALL (*)     Bacteria, UA RARE (*)    Squamous Epithelial / LPF 6-30 (*)    All other components within normal limits  CBG MONITORING, ED - Abnormal; Notable for the following:    Glucose-Capillary 330 (*)    All other components within normal limits  CBG MONITORING, ED    EKG  EKG Interpretation None       Radiology No results found.  Procedures Procedures (including critical care time)  Medications Ordered in ED Medications - No data to display   Initial Impression / Assessment and Plan / ED Course  I have reviewed the triage vital signs and the nursing notes.  Pertinent labs & imaging  results that were available during my care of the patient were reviewed by me and considered in my medical decision making (see chart for details).     Patient's eye exam is inconsistent at this time. I counseled her Dr. Allena Katz from ophthalmology. He has seen the patient and feels that the patient has more vision than what she says she does. The patient herself states that she cannot see out of her left eye but on Dr. Eliane Decree exam and per his note she does have vision in that left eye. She does have a old cataract in the right eye. In my presence in her room, Dr. Allena Katz tossed a rolled gauze at her and she was able to try to catch it from approximately 6-8 feet away. Patient's blood pressure also noted to be elevated and this was treated with IV fluids and insulin. Patient states that she does not feel comfortable going home. Patient will like to be admitted to the hospital to have her surgery for her cataract. Dr. Allena Katz. This is not indicated at this time. Patient became upset and wanted to know how she could speak with the hospital administrator as well as an attorney. I informed her that her nurse can help her with this. I will consult the social worker to see her in the morning to work on nursing home placement.  Final Clinical Impressions(s) / ED Diagnoses   Final diagnoses:  None    New  Prescriptions New Prescriptions   No medications on file     Lorre Nick, MD 12/08/16 2353

## 2016-12-08 NOTE — Care Management (Signed)
ED CM spoke with the patient at the bedside. She states she is able to obtain all of her medications because she has Medicare and Medicaid. Reports she has insulin but is out or syringes and strips for her meter. Reports [facility] on [address] requested a prescription for the syringes she uses today but the request was denied by the physician's office. She gets her test strips from her physician and states her last visit was the last time she was here. Patient is legally blind but states she was able to see out of her left eye until she went outside today. Describes her vision as looking thru a white plastic bag. She reports she has been attempting to get scheduled to have cataracts removed, the next available appointment is 8 months out. She states if her vision does not return in her left eye she will need to go to a facility until it returns to avoid being at home alone at night. Her boyfriend works the night shift at the [employer]. CSW made aware of the patient's request.  CM contacted [facility] [address] and spoke with Sharyl NimrodMeredith. She reports [facility], Dr. Chilton SiGreen, did not refill the syringes because Ms. Yanda is no longer their patient. The patient uses BD Ultra Fine 5/31 gauge syringes for her insulin and will need a prescription when discharged. Rubie Maidrystal Maythe Deramo RN CCM

## 2016-12-08 NOTE — ED Notes (Signed)
Bed: WA09 Expected date:  Expected time:  Means of arrival:  Comments: [facility] 

## 2016-12-08 NOTE — Consult Note (Signed)
Kelly Richardson                                                                               12/08/2016    Ophthalmology Consultation                                         Consult requested by: Dr. Freida BusmanAllen  Reason for consultation: vision loss - bilateral  HPI: Patient complains of vision loss in left eye for one day.  States vision in right eye has been bad for some time.  Notes that she cannot get cataract surgery for 8 months and requests emergent cataract surgery.  Pertinent Medical History:   Active Ambulatory Problems    Diagnosis Date Noted  . Uncontrolled insulin dependent diabetes mellitus (HCC) 01/02/2008  . Hyperlipidemia 01/02/2008  . Anxiety state 03/05/2008  . Essential hypertension 01/02/2008  . FATIGUE 03/05/2008  . Status post revision of total hip replacement 06/08/2014  . Anemia due to blood loss 06/20/2014  . Chest pain 06/22/2014  . Osteomyelitis of right hip (HCC) 07/12/2014  . H/O total hip arthroplasty 11/09/2014  . Health care maintenance 04/09/2015   Resolved Ambulatory Problems    Diagnosis Date Noted  . 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 Arthritis   . Complication of anesthesia   . Diabetes mellitus without complication (HCC)   . Heart murmur   . Hypertension   . PONV (postoperative nausea and vomiting)   . Seizures (HCC)   . Shortness of breath dyspnea       Pertinent Ophthalmic History: Cataracts OU (OD>>OS).  During exam, patient was able to localize a ball of gauze lofted in the air, although states that she could barely make out hand movement in front of her left eye.  She reached for the gauze and stopped short of catching it.  Current Eye Medications: none  Systemic medications on admission:   (Not in a hospital admission)     ROS: vision loss left eye, otherwise [place]  Visual Fields: FTC OS, OD - none   Pupils:  Pharmacologically dilated at my direction before exam   Previously  noted - no APD      Near acuity:   [place]   LP    OD       [place] HM OS     TA:       deferred   Dilation:  both eyes        [X  ]Tropicamide   Arly.Keller[X ] Phenylephrine   External:   OD:  Normal       OS:  Normal      Anterior segment exam:  By penlight   Conjunctiva:  OD:  Quiet      OS:  Quiet     Cornea:    OD: Clear, no fluorescein stain       OS: Clear, no fluorescein stain      Anterior Chamber:   OD:  Deep/quiet      OS:  Deep/quiet  Iris:    OD:  Normal      OS:  Normal      Lens:    OD:  Dense cataract OD with PSC - no view of posterior pole   OS:  2+ NS     Optic disc:  OD:  No view  OS:  Normal, no cupping or pallor  Central retina--examined with indirect ophthalmoscope:  OD:  No view  OS:  Macula and vessels normal; media clear      Impression:  Dense cataract OD Unqualified vision loss OS - not attributable to cataract, neuropathy or retinal pathology Severe myopia left eye  Recommendations/Plan: Follow-up as outpatient for retina evaluation in my office and cataract evaluation with general ophthalmology.  I've discussed these findings with Dr. Freida Busman. Please contact our office with any questions or concerns at [phone].  Harrold Donath

## 2016-12-08 NOTE — Progress Notes (Signed)
Patient arrived per EMS with complaint of blurred vision that started about a week ago when she could not afford her medications

## 2016-12-08 NOTE — ED Notes (Signed)
[facility] Expected date:  Expected time:  Means of arrival:  Comments: EMS 66 yo female hyperglycemia/non-compliant with insulin x 1 month

## 2016-12-09 DIAGNOSIS — H539 Unspecified visual disturbance: Secondary | ICD-10-CM | POA: Diagnosis not present

## 2016-12-09 DIAGNOSIS — E10649 Type 1 diabetes mellitus with hypoglycemia without coma: Secondary | ICD-10-CM | POA: Diagnosis not present

## 2016-12-09 DIAGNOSIS — H538 Other visual disturbances: Secondary | ICD-10-CM | POA: Diagnosis not present

## 2016-12-09 LAB — CBG MONITORING, ED
GLUCOSE-CAPILLARY: 186 mg/dL — AB (ref 65–99)
GLUCOSE-CAPILLARY: 267 mg/dL — AB (ref 65–99)
Glucose-Capillary: 297 mg/dL — ABNORMAL HIGH (ref 65–99)
Glucose-Capillary: 275 mg/dL — ABNORMAL HIGH (ref 65–99)
Glucose-Capillary: 329 mg/dL — ABNORMAL HIGH (ref 65–99)

## 2016-12-09 MED ORDER — INSULIN DETEMIR 100 UNIT/ML ~~LOC~~ SOLN
40.0000 [IU] | Freq: Every day | SUBCUTANEOUS | Status: DC
  Administered 2016-12-09: 40 [IU] via SUBCUTANEOUS
  Filled 2016-12-09: qty 0.4

## 2016-12-09 MED ORDER — INSULIN DETEMIR 100 UNIT/ML FLEXPEN: 40.0000 [IU] | PEN_INJECTOR | Freq: Every day | SUBCUTANEOUS | 0 refills | Status: AC

## 2016-12-09 MED ORDER — INSULIN ASPART 100 UNIT/ML ~~LOC~~ SOLN: 0.0000 [IU] | Freq: Three times a day (TID) | SUBCUTANEOUS | Status: DC

## 2016-12-09 NOTE — ED Notes (Addendum)
Upon entering room pt reports auditory and visual hallucinations, pt reports that she hears and sees bee's swarming near the ceiling in her room. No insects are visualized or heard in her room at this time. Pt believes that the eye drops she was given are causing her to hallucinate. Bebe ShaggyWickline, MD informed.

## 2016-12-09 NOTE — Care Management (Signed)
ED CM met with patient at bedside to discuss care transitional planning. Patient states, she is unable to see to give herself  medications primarily concerned about her insulin.  Patient reports living alone and not having any family or friend for support. CM explained that she does not meet criteria for an acute care admission, patient verbalized understanding teach back done.  Discussed care options at this time with patient, Placement or [place] services. Patient has now elected for placement. ED CSW notified of patient's decision. PT, OT evaluation placed.

## 2016-12-09 NOTE — Progress Notes (Signed)
CSW spoke with patient regarding SNF placement and HH services. CSW informed patient that Medicare guidelines require a 3- night inpatient stay- which patient does not have. Patient is able to use her Medicaid but would have to be agreeable to give up her check for the month. Patient not agreeable at this time to give up Medicaid check. CS[facility] attempted to discuss [facility] but patient stated " I cant go home and you cant make me. I need to have surgery so I cant go home until I have it. Call me attorney and the director of the hospital". CSW contacted CM and AC for assistance. Will continue to update.   Stacy GardnerErin Dayln Tugwell, LCSWA Clinical Social Worker [phone]

## 2016-12-09 NOTE — ED Notes (Signed)
Patient continues to state "I can't go home because I can't see". Patient able to ambulate with standby assist to bathroom and ate 100%breakfast independently.

## 2016-12-09 NOTE — Progress Notes (Signed)
CSW provided pt's PASSR [facility].  RN arranging transportation at D/C.  Dorothe PeaJonathan F. Horace Wishon, Theresia MajorsLCSWA, LCAS Clinical Social Worker Ph: [phone]

## 2016-12-09 NOTE — ED Provider Notes (Signed)
Pt was seen by Child psychotherapistsocial worker.  She would have to pay out of pocket for a nursing home.  She does not want that.  She wants to talk to an attorney and the administrator.  Pt was evaluated by ophthalmology last night.  She has been medically evaluated thoroughly.  Pt is not happy and does not want to be discharged but I do not see a medical indication for hospitalization or emergent surgical intervention.   Linwood DibblesKnapp, Roisin Mones, MD 12/09/16 [phone]

## 2016-12-09 NOTE — ED Notes (Signed)
Pt stated "I couldn't see out of my right eye and my sugar had been high, I was unable to take my insulin and had defecated on myself.  All my family is dead and I don't have any friends.  But they say it's my cataracts that are causing the problem."

## 2016-12-09 NOTE — Progress Notes (Signed)
CSW spoke to [place] at [facility] who is reviewing pt's chart.  CSW checked and per [place] MUST pt's PASSR number is [id number].  CSW will continue to follow for D/C needs.   Dorothe PeaJonathan F. Amaliya Whitelaw, Theresia MajorsLCSWA, LCAS Clinical Social Worker Ph: [phone]

## 2016-12-09 NOTE — Care Management (Signed)
ED CM spoke with [facility] CSW concerning patient. accepted [facility].

## 2016-12-09 NOTE — ED Notes (Signed)
Case worker saw patient and now patient wants to see Denny Peonrin, Child psychotherapistsocial worker

## 2016-12-09 NOTE — ED Notes (Signed)
Pt was having graphic hallucinations but not any more

## 2016-12-09 NOTE — NC FL2 (Signed)
  [place] MEDICAID FL2 LEVEL OF CARE SCREENING TOOL     IDENTIFICATION  Patient Name: Kelly Richardson Birthdate: [birth year] Sex: female Admission Date (Current Location): 12/08/2016  [facility] [place]   Facility and Address:  [facility],  [address], [place] [id number]      Provider Number: [id number]  Attending Physician Name and Address:  Default, Provider, MD  Relative Name and Phone Number:       Current Level of Care: Hospital Recommended Level of Care: Skilled Nursing Facility Prior Approval Number:    Date Approved/Denied:   PASRR Number:    Discharge Plan: SNF    Current Diagnoses: Patient Active Problem List   Diagnosis Date Noted  . Health care maintenance 04/09/2015  . H/O total hip arthroplasty 11/09/2014  . Osteomyelitis of right hip (HCC) 07/12/2014  . Chest pain 06/22/2014  . Anemia due to blood loss 06/20/2014  . Status post revision of total hip replacement 06/08/2014  . Anxiety state 03/05/2008  . FATIGUE 03/05/2008  . Uncontrolled insulin dependent diabetes mellitus (HCC) 01/02/2008  . Hyperlipidemia 01/02/2008  . Essential hypertension 01/02/2008    Orientation RESPIRATION BLADDER Height & Weight     Self, Time, Situation, Place  Normal Continent Weight: 185 lb (83.9 kg) Height:  5\' 2"  (157.5 cm)  BEHAVIORAL SYMPTOMS/MOOD NEUROLOGICAL BOWEL NUTRITION STATUS      Continent Diet (regular)  AMBULATORY STATUS COMMUNICATION OF NEEDS Skin   Limited Assist Verbally Normal                       Personal Care Assistance Level of Assistance  Bathing, Dressing, Feeding Bathing Assistance: Limited assistance Feeding assistance: Independent Dressing Assistance: Limited assistance     Functional Limitations Info  Sight Sight Info: Impaired        SPECIAL CARE FACTORS FREQUENCY  PT (By licensed PT), OT (By licensed OT)     PT Frequency: 5 OT Frequency: 5            Contractures       Additional Factors Info  Code Status, Allergies Code Status Info: Prior Allergies Info: Allergies: Metformin, Penicillins, Seldane Terfenadine, Sulfonamide Derivatives, Vicks Formula 44 Cough Relief Dextromethorphan Hbr, Xanax Alprazolam, Iohexol, Latex, Other, Percocet Oxycodone-acetaminophen           Current Medications (12/09/2016):  This is the current hospital active medication list Current Facility-Administered Medications  Medication Dose Route Frequency Provider Last Rate Last Dose  . Insulin Detemir (LEVEMIR) FlexPen 40 Units  40 Units Subcutaneous QHS Linwood DibblesKnapp, Jon, MD       Current Outpatient Prescriptions  Medication Sig Dispense Refill  . insulin aspart (NOVOLOG) 100 UNIT/ML injection Inject 5 Units into the skin 3 (three) times daily before meals. Ac meals for cbg>=150    . LEVEMIR FLEXTOUCH 100 UNIT/ML SOPN Inject 40 Units into the skin at bedtime.        Discharge Medications: Please see discharge summary for a list of discharge medications.  Relevant Imaging Results:  Relevant Lab Results:   Additional Information SS#: [id number]  Donnie CoffinErin M Verlon Pischke, LCSW

## 2016-12-09 NOTE — ED Notes (Signed)
Awaiting PT to see patient, possible placement

## 2016-12-09 NOTE — ED Provider Notes (Signed)
Social worker has arranged for patient to go to [facility]. She is going tonight. Her Levemir prescription has been written for her for her facility. Stable for discharge.   Kelly LovelessGoldston, Kelly Vandevelde, MD 12/09/16 2148

## 2016-12-09 NOTE — Evaluation (Signed)
Physical Therapy Evaluation Patient Details Name: TEHANI MERSMAN MRN: [id number] DOB: [birth year] Today's Date: 12/09/2016   History of Present Illness  Pt is a 66 year old female presenting to ED due to bilateral vision loss and seen by Ophthalmology, recommending outpatient follow up. Pt with hx of multiple R hip surgeries  Clinical Impression  Pt presented to ED with above diagnosis. Pt currently with functional limitations due to the deficits listed below (see PT Problem List). Pt will benefit from skilled PT to increase their independence and safety with mobility to allow discharge to the venue listed below.  Pt reporting new weakness in LEs since PT entered her room however able to move LEs easily against gravity.  Pt able to perform bed mobility well however reporting LE weakness and requiring at least min assist for standing and ambulation.  Pt also reports poor vision stating "I can't see" however uncertain of degree of vision loss as pt requests assist at times however able to reach for objects at other times.  Pt agreeable to SNF since she does not feel she will be able to care for herself at home in current condition.     Follow Up Recommendations SNF;Supervision/Assistance - 24 hour    Equipment Recommendations  Rolling walker with 5" wheels    Recommendations for Other Services       Precautions / Restrictions Precautions Precautions: Fall Precaution Comments: pt reports blindness      Mobility  Bed Mobility Overal bed mobility: Needs Assistance Bed Mobility: Supine to Sit;Sit to Supine     Supine to sit: Supervision Sit to supine: Supervision   General bed mobility comments: requested pt sit upright and return to supine which she was able to perform without cues, supervision for safety due to reported vision loss  Transfers Overall transfer level: Needs assistance Equipment used: 2 person hand held assist Transfers: Sit to/from Stand Sit to Stand: Min  assist;+2 safety/equipment         General transfer comment: pt reports being unable to stand, provided 2 HHA however pt returned to sitting due to weakness, encouraged pt to attempt again and pt able to stand  Ambulation/Gait Ambulation/Gait assistance: Min assist;+2 safety/equipment Ambulation Distance (Feet): 10 Feet Assistive device: 2 person hand held assist Gait Pattern/deviations: Step-through pattern;Decreased stride length     General Gait Details: pt reports inability to ambulate and required encouraged to attempt to at least ambulate to bathroom, pt able to perform with 2 HHA, returning to bed only one HHA and pt "felt" for objects in room (able to reach for objects without cues)  Stairs            Wheelchair Mobility    Modified Rankin (Stroke Patients Only)       Balance Overall balance assessment: Needs assistance         Standing balance support: Bilateral upper extremity supported Standing balance-Leahy Scale: Poor Standing balance comment: requiring UE assist at this time                             Pertinent Vitals/Pain Pain Assessment: No/denies pain    Home Living   Living Arrangements: Alone             Home Equipment: None      Prior Function Level of Independence: Independent         Comments: pt reports independence prior to losing vision, reports being in bed for  a day prior to admission and currently now requiring assist due to weakness and poor vision     Hand Dominance        Extremity/Trunk Assessment        Lower Extremity Assessment Lower Extremity Assessment: Generalized weakness (reports weakness however able to move LEs against gravity)       Communication   Communication: No difficulties  Cognition Arousal/Alertness: Awake/alert Behavior During Therapy: WFL for tasks assessed/performed Overall Cognitive Status: Within Functional Limits for tasks assessed                                         General Comments General comments (skin integrity, edema, etc.): Pt used grab bar in bathroom to self assist and able to perform hygiene aftering urinating however required assist for washing hands at sink    Exercises     Assessment/Plan    PT Assessment Patient needs continued PT services  PT Problem List Decreased strength;Decreased mobility;Decreased activity tolerance;Decreased knowledge of use of DME       PT Treatment Interventions DME instruction;Gait training;Balance training;Therapeutic exercise;Functional mobility training;Therapeutic activities;Patient/family education    PT Goals (Current goals can be found in the Care Plan section)  Acute Rehab PT Goals Patient Stated Goal: agreeable to SNF PT Goal Formulation: With patient Time For Goal Achievement: 12/16/16 Potential to Achieve Goals: Good    Frequency Min 3X/week   Barriers to discharge        Co-evaluation               AM-PAC PT "6 Clicks" Daily Activity  Outcome Measure Difficulty turning over in bed (including adjusting bedclothes, sheets and blankets)?: None Difficulty moving from lying on back to sitting on the side of the bed? : A Little Difficulty sitting down on and standing up from a chair with arms (e.g., wheelchair, bedside commode, etc,.)?: A Little Help needed moving to and from a bed to chair (including a wheelchair)?: A Little Help needed walking in hospital room?: A Little Help needed climbing 3-5 steps with a railing? : A Lot 6 Click Score: 18    End of Session Equipment Utilized During Treatment: Gait belt Activity Tolerance: Patient tolerated treatment well Patient left: in bed;with call bell/phone within reach Nurse Communication: Mobility status PT Visit Diagnosis: Muscle weakness (generalized) (M62.81);Difficulty in walking, not elsewhere classified (R26.2)    Time: [id number] PT Time Calculation (min) (ACUTE ONLY): 16 min   Charges:   PT  Evaluation $PT Eval Low Complexity: 1 Procedure     PT G Codes:   PT G-Codes **NOT FOR INPATIENT CLASS** Functional Assessment Tool Used: AM-PAC 6 Clicks Basic Mobility;Clinical judgement Functional Limitation: Mobility: Walking and moving around Mobility: Walking and Moving Around Current Status (V4098(G8978): At least 20 percent but less than 40 percent impaired, limited or restricted Mobility: Walking and Moving Around Goal Status [phone](G8979): At least 1 percent but less than 20 percent impaired, limited or restricted   Zenovia JarredKati Zailyn Rowser, PT, DPT 12/09/2016 Pager: [phone]   Maida SaleLEMYRE,KATHrine E 12/09/2016, 3:02 PM

## 2016-12-09 NOTE — ED Provider Notes (Signed)
I was called to room because patient wanted to talk to physician She reports chest pain She also reports since given medications she is seeing things, reports visualizing bees swarming and a yellow horse She is awake/alert, no distress She is clearly tracking me when I walk around room She does not appear psychotic EKG performed   EKG Interpretation  Date/Time:  Wednesday December 09 2016 01:29:55 EDT Ventricular Rate:  102 PR Interval:    QRS Duration: 100 QT Interval:  350 QTC Calculation: 456 R Axis:   53 Text Interpretation:  Sinus tachycardia Low voltage, precordial leads Borderline T abnormalities, inferior leads No significant change since last tracing Confirmed by Zadie RhineWickline, Rylin Saez ([id number]) on 12/09/2016 2:10:32 AM      Continue monitoring for SW/CM evaluation    Zadie RhineWickline, Philopater Mucha, MD 12/09/16 [phone]

## 2016-12-09 NOTE — Progress Notes (Signed)
CSW spoke with Tammy at [facility] who had a Medicaid bed available.  Tammy spoke to pt who stated that pt was blind and Tammy noted pt was not blind.  Although Tammy was able to view pt's PT recommendation for SNF that described weakness regarding pt's ability to ambulate and stand Tammy stated she feels pt is not skillable due to the pt's eye problem that is the foremost issue and that this is not skillable, in [facility]'s opinion.  Tammy from [facility] stated pt was in her a case a case to be reviewed by psyche, due to pt's reports of auditory and visual hallucinations on 12/09/16.  Dorothe PeaJonathan F. Raeleigh Guinn, Theresia MajorsLCSWA, LCAS Clinical Social Worker Ph: [phone]

## 2016-12-09 NOTE — Progress Notes (Signed)
Patient now agreeable to provide SNF facility with Medicaid benefits and stay for atleast 30 days. Patient is waiting for PT evaluation to determine if SNF is appropriate. If PT recommends SNF CSW will need to complete FL2/Pasrr #/ bed search. Will continue to follow up.   Stacy GardnerErin Jayveion Stalling, LCSWA Clinical Social Worker [phone]

## 2016-12-09 NOTE — ED Notes (Signed)
Ptar here for transport 

## 2016-12-09 NOTE — Progress Notes (Addendum)
CSW received a call from [place] at [facility] Pt has been accepted by: [facility] Number for report is: [phone] Pt's room/bed number will be Room 201 Pt's [address] will be: [address] Accepting physician: SNF MD   Pt can arrive ASAP on 12/09/16   CSW will update RN and EDP.  Dorothe PeaJonathan F. Daevion Navarette, Theresia MajorsLCSWA, LCAS Clinical Social Worker Ph: [phone]

## 2016-12-12 ENCOUNTER — Encounter (HOSPITAL_COMMUNITY): Payer: Self-pay | Admitting: Emergency Medicine

## 2016-12-12 ENCOUNTER — Emergency Department (HOSPITAL_COMMUNITY)
Admission: EM | Admit: 2016-12-12 | Discharge: 2016-12-12 | Disposition: A | Discharge status: Skilled Nursing Facility | Payer: Medicare Other | Attending: Emergency Medicine | Admitting: Emergency Medicine

## 2016-12-12 DIAGNOSIS — R739 Hyperglycemia, unspecified: Secondary | ICD-10-CM

## 2016-12-12 DIAGNOSIS — R1013 Epigastric pain: Secondary | ICD-10-CM | POA: Diagnosis not present

## 2016-12-12 DIAGNOSIS — R200 Anesthesia of skin: Secondary | ICD-10-CM | POA: Diagnosis present

## 2016-12-12 DIAGNOSIS — E1365 Other specified diabetes mellitus with hyperglycemia: Secondary | ICD-10-CM | POA: Diagnosis not present

## 2016-12-12 DIAGNOSIS — R531 Weakness: Secondary | ICD-10-CM | POA: Diagnosis not present

## 2016-12-12 DIAGNOSIS — Z794 Long term (current) use of insulin: Secondary | ICD-10-CM | POA: Insufficient documentation

## 2016-12-12 DIAGNOSIS — I10 Essential (primary) hypertension: Secondary | ICD-10-CM | POA: Diagnosis not present

## 2016-12-12 DIAGNOSIS — E1159 Type 2 diabetes mellitus with other circulatory complications: Secondary | ICD-10-CM | POA: Diagnosis not present

## 2016-12-12 DIAGNOSIS — E1165 Type 2 diabetes mellitus with hyperglycemia: Secondary | ICD-10-CM | POA: Diagnosis not present

## 2016-12-12 LAB — CBG MONITORING, ED: Glucose-Capillary: 261 mg/dL — ABNORMAL HIGH (ref 65–99)

## 2016-12-12 NOTE — ED Notes (Signed)
ED Provider at bedside. 

## 2016-12-12 NOTE — ED Provider Notes (Signed)
[facility]-EMERGENCY DEPT Provider Note: Lowella Dell, MD, FACEP  CSN: [id number] MRN: [id number] ARRIVAL: 12/12/16 at 1516 ROOM: WA10/WA10   CHIEF COMPLAINT  Numbness   HISTORY OF PRESENT ILLNESS  Kelly Richardson is a 66 y.o. female who was seen on the fifth of this month for complaints of blurred vision. She was evaluated by ophthalmology who determined that her vision was not as diminished and she claimed. She was noted to have a cataract which did not require emergent intervention. She was insistent upon being admitted for cataract surgery but was told this was not indicated. She requested a social work consult so that she can get into a nursing home. She was boarded in the ED while this was arranged. She was transferred to [facility].  She came by ambulance from [facility] complaining of numbness to her legs, pain in her left arm and pain in her head. Nursing notes also reported epigastric pain. She told staff that she was calling out for help at her facility without reply.  At the present time she denies numbness or pain. She denies headache, chest pain, abdominal pain, nausea and shortness of breath. She states she was dizzy when she ambulated to the bathroom earlier. By dizzy she means "everything was spinning". She denies dizziness at the present time. She states this only occurred when she was up walking. When asked if she has an acute complaint at the present time she could not give an answer.  She also thinks that there has been something wrong with her blood sugar.   Past Medical History:  Diagnosis Date  . Arthritis   . Complication of anesthesia   . Diabetes mellitus without complication (HCC)   . Heart murmur    as a child  . Hypertension   . PONV (postoperative nausea and vomiting)   . Seizures (HCC)   . Shortness of breath dyspnea    sometimes  at night    Past Surgical History:  Procedure Laterality Date  . ABDOMINAL HYSTERECTOMY    .  CHOLECYSTECTOMY  1995  . COLONOSCOPY    . EXCISIONAL TOTAL HIP ARTHROPLASTY WITH ANTIBIOTIC SPACERS Right 09/07/2014   Procedure: Excision Total Hip Arthroplasty, Place Antibiotic Spacer;  Surgeon: Nadara Mustard, MD;  Location: [facility] OR;  Service: Orthopedics;  Laterality: Right;  . INCISION AND DRAINAGE HIP Right 06/08/2014   Procedure: Irrigation and Debridement Right Hip, Revision of Cup and Head of Total Hip with  Placement  of Antibiotic Spacer;  Surgeon: Nadara Mustard, MD;  Location: [facility] OR;  Service: Orthopedics;  Laterality: Right;  . TONSILLECTOMY    . TOTAL HIP ARTHROPLASTY Right 2011  . TOTAL HIP REVISION Right 11/09/2014   Procedure: TOTAL HIP REVISION;  Surgeon: Nadara Mustard, MD;  Location: [facility] OR;  Service: Orthopedics;  Laterality: Right;    History reviewed. No pertinent family history.  Social History  Substance Use Topics  . Smoking status: Never Smoker  . Smokeless tobacco: Never Used  . Alcohol use No    Prior to Admission medications   Medication Sig Start Date End Date Taking? Authorizing Provider  insulin aspart (NOVOLOG) 100 UNIT/ML injection Inject 5 Units into the skin 3 (three) times daily before meals. Ac meals for cbg>=150    [provider]  Insulin Detemir (LEVEMIR FLEXTOUCH) 100 UNIT/ML Pen Inject 40 Units into the skin at bedtime. 12/09/16   Pricilla Loveless, MD    Allergies Metformin; Penicillins; Seldane [terfenadine]; Sulfonamide derivatives;  Vicks formula 44 cough relief [dextromethorphan hbr]; Xanax [alprazolam]; Iohexol; Latex; Other; and Percocet [oxycodone-acetaminophen]   REVIEW OF SYSTEMS  Negative except as noted here or in the History of Present Illness.   PHYSICAL EXAMINATION  Initial Vital Signs Blood pressure 136/84, pulse 98, temperature 98.2 F (36.8 C), temperature source Oral, resp. rate 16, SpO2 99 %.  Examination General: Well-developed, well-nourished female in no acute distress; appears older than age of record HENT:  normocephalic; atraumatic Eyes: pupils equal, round and reactive to light; extraocular muscles intact; right cataract Neck: supple Heart: regular rate and rhythm Lungs: clear to auscultation bilaterally Abdomen: soft; nondistended; mild epigastric tenderness; no masses or hepatosplenomegaly; bowel sounds present Extremities: No deformity; full range of motion; pulses normal Neurologic: Awake, alert and oriented; motor function intact in all extremities and symmetric; no facial droop Skin: Warm and dry; facial hirsutism Psychiatric: Normal mood and affect   RESULTS  Summary of this visit's results, reviewed by myself:   EKG Interpretation  Date/Time:    Ventricular Rate:    PR Interval:    QRS Duration:   QT Interval:    QTC Calculation:   R Axis:     Text Interpretation:        Laboratory Studies: Results for orders placed or performed during the hospital encounter of 12/12/16 (from the past 24 hour(s))  CBG monitoring, ED     Status: Abnormal   Collection Time: 12/12/16  4:29 PM  Result Value Ref Range   Glucose-Capillary 261 (H) 65 - 99 mg/dL   Imaging Studies: No results found.  ED COURSE  Nursing notes and initial vitals signs, including pulse oximetry, reviewed.  Vitals:   12/12/16 1525  BP: 136/84  Pulse: 98  Resp: 16  Temp: 98.2 F (36.8 C)  TempSrc: Oral  SpO2: 99%   5:04 PM The patient is requesting no acute intervention at this time.. She states that the staff at her facility have been assisting her with her insulin due to her poor vision.  PROCEDURES    ED DIAGNOSES     ICD-10-CM   1. Hyperglycemia R73.9        Priseis Cratty, Jonny RuizJohn, MD 12/12/16 1705

## 2016-12-12 NOTE — ED Notes (Signed)
Bed: [id number] Expected date:  Expected time:  Means of arrival:  Comments: 66 yo Epigastric pain

## 2016-12-12 NOTE — ED Triage Notes (Signed)
Per EMS, patient from [facility], c/o numbness to bilateral legs and left arm as well as epigastric pain since this morning. Patient reports she was calling out for help with no reply at facility. Requesting social work consult. Denies chest pain, SOB, and nausea. Ambulatory with EMS. A&Ox4.  BP 163/105 HR 101 RR 18 O2 98% CBG 324

## 2016-12-12 NOTE — ED Notes (Signed)
PTAR called for transport.  

## 2016-12-12 NOTE — ED Notes (Addendum)
[facility] here to transport pt back to [facility].

## 2016-12-12 NOTE — ED Notes (Signed)
Bed: WHALB Expected date:  Expected time:  Means of arrival:  Comments: 

## 2016-12-14 DIAGNOSIS — H539 Unspecified visual disturbance: Secondary | ICD-10-CM | POA: Diagnosis not present

## 2016-12-14 DIAGNOSIS — R278 Other lack of coordination: Secondary | ICD-10-CM | POA: Diagnosis not present

## 2016-12-14 DIAGNOSIS — E1136 Type 2 diabetes mellitus with diabetic cataract: Secondary | ICD-10-CM | POA: Diagnosis not present

## 2016-12-14 DIAGNOSIS — M6281 Muscle weakness (generalized): Secondary | ICD-10-CM | POA: Diagnosis not present

## 2016-12-14 DIAGNOSIS — R739 Hyperglycemia, unspecified: Secondary | ICD-10-CM | POA: Diagnosis not present

## 2016-12-15 DIAGNOSIS — H539 Unspecified visual disturbance: Secondary | ICD-10-CM | POA: Diagnosis not present

## 2016-12-15 DIAGNOSIS — R739 Hyperglycemia, unspecified: Secondary | ICD-10-CM | POA: Diagnosis not present

## 2016-12-15 DIAGNOSIS — E1136 Type 2 diabetes mellitus with diabetic cataract: Secondary | ICD-10-CM | POA: Diagnosis not present

## 2016-12-15 DIAGNOSIS — M6281 Muscle weakness (generalized): Secondary | ICD-10-CM | POA: Diagnosis not present

## 2016-12-15 DIAGNOSIS — R278 Other lack of coordination: Secondary | ICD-10-CM | POA: Diagnosis not present

## 2016-12-16 DIAGNOSIS — H2521 Age-related cataract, morgagnian type, right eye: Secondary | ICD-10-CM | POA: Diagnosis not present

## 2016-12-16 DIAGNOSIS — H25042 Posterior subcapsular polar age-related cataract, left eye: Secondary | ICD-10-CM | POA: Diagnosis not present

## 2016-12-16 DIAGNOSIS — M6281 Muscle weakness (generalized): Secondary | ICD-10-CM | POA: Diagnosis not present

## 2016-12-16 DIAGNOSIS — R278 Other lack of coordination: Secondary | ICD-10-CM | POA: Diagnosis not present

## 2016-12-16 DIAGNOSIS — E1136 Type 2 diabetes mellitus with diabetic cataract: Secondary | ICD-10-CM | POA: Diagnosis not present

## 2016-12-16 DIAGNOSIS — R7309 Other abnormal glucose: Secondary | ICD-10-CM | POA: Diagnosis not present

## 2016-12-16 DIAGNOSIS — E119 Type 2 diabetes mellitus without complications: Secondary | ICD-10-CM | POA: Diagnosis not present

## 2016-12-16 DIAGNOSIS — R739 Hyperglycemia, unspecified: Secondary | ICD-10-CM | POA: Diagnosis not present

## 2016-12-16 DIAGNOSIS — Z7189 Other specified counseling: Secondary | ICD-10-CM | POA: Diagnosis not present

## 2016-12-16 DIAGNOSIS — H548 Legal blindness, as defined in USA: Secondary | ICD-10-CM | POA: Diagnosis not present

## 2016-12-16 DIAGNOSIS — F418 Other specified anxiety disorders: Secondary | ICD-10-CM | POA: Diagnosis not present

## 2016-12-16 DIAGNOSIS — H539 Unspecified visual disturbance: Secondary | ICD-10-CM | POA: Diagnosis not present

## 2016-12-17 DIAGNOSIS — F419 Anxiety disorder, unspecified: Secondary | ICD-10-CM | POA: Diagnosis not present

## 2016-12-18 DIAGNOSIS — R278 Other lack of coordination: Secondary | ICD-10-CM | POA: Diagnosis not present

## 2016-12-18 DIAGNOSIS — M6281 Muscle weakness (generalized): Secondary | ICD-10-CM | POA: Diagnosis not present

## 2016-12-18 DIAGNOSIS — R739 Hyperglycemia, unspecified: Secondary | ICD-10-CM | POA: Diagnosis not present

## 2016-12-18 DIAGNOSIS — E1136 Type 2 diabetes mellitus with diabetic cataract: Secondary | ICD-10-CM | POA: Diagnosis not present

## 2016-12-18 DIAGNOSIS — H539 Unspecified visual disturbance: Secondary | ICD-10-CM | POA: Diagnosis not present

## 2016-12-21 DIAGNOSIS — E1136 Type 2 diabetes mellitus with diabetic cataract: Secondary | ICD-10-CM | POA: Diagnosis not present

## 2016-12-21 DIAGNOSIS — R739 Hyperglycemia, unspecified: Secondary | ICD-10-CM | POA: Diagnosis not present

## 2016-12-21 DIAGNOSIS — M6281 Muscle weakness (generalized): Secondary | ICD-10-CM | POA: Diagnosis not present

## 2016-12-21 DIAGNOSIS — R278 Other lack of coordination: Secondary | ICD-10-CM | POA: Diagnosis not present

## 2016-12-21 DIAGNOSIS — H539 Unspecified visual disturbance: Secondary | ICD-10-CM | POA: Diagnosis not present

## 2016-12-22 DIAGNOSIS — M6281 Muscle weakness (generalized): Secondary | ICD-10-CM | POA: Diagnosis not present

## 2016-12-22 DIAGNOSIS — R739 Hyperglycemia, unspecified: Secondary | ICD-10-CM | POA: Diagnosis not present

## 2016-12-22 DIAGNOSIS — E1136 Type 2 diabetes mellitus with diabetic cataract: Secondary | ICD-10-CM | POA: Diagnosis not present

## 2016-12-22 DIAGNOSIS — H539 Unspecified visual disturbance: Secondary | ICD-10-CM | POA: Diagnosis not present

## 2016-12-22 DIAGNOSIS — R278 Other lack of coordination: Secondary | ICD-10-CM | POA: Diagnosis not present

## 2016-12-23 DIAGNOSIS — E1136 Type 2 diabetes mellitus with diabetic cataract: Secondary | ICD-10-CM | POA: Diagnosis not present

## 2016-12-23 DIAGNOSIS — R278 Other lack of coordination: Secondary | ICD-10-CM | POA: Diagnosis not present

## 2016-12-23 DIAGNOSIS — M6281 Muscle weakness (generalized): Secondary | ICD-10-CM | POA: Diagnosis not present

## 2016-12-23 DIAGNOSIS — H539 Unspecified visual disturbance: Secondary | ICD-10-CM | POA: Diagnosis not present

## 2016-12-23 DIAGNOSIS — R739 Hyperglycemia, unspecified: Secondary | ICD-10-CM | POA: Diagnosis not present

## 2016-12-24 DIAGNOSIS — R278 Other lack of coordination: Secondary | ICD-10-CM | POA: Diagnosis not present

## 2016-12-24 DIAGNOSIS — M6281 Muscle weakness (generalized): Secondary | ICD-10-CM | POA: Diagnosis not present

## 2016-12-24 DIAGNOSIS — R739 Hyperglycemia, unspecified: Secondary | ICD-10-CM | POA: Diagnosis not present

## 2016-12-24 DIAGNOSIS — H539 Unspecified visual disturbance: Secondary | ICD-10-CM | POA: Diagnosis not present

## 2016-12-24 DIAGNOSIS — E1136 Type 2 diabetes mellitus with diabetic cataract: Secondary | ICD-10-CM | POA: Diagnosis not present

## 2016-12-25 DIAGNOSIS — M6281 Muscle weakness (generalized): Secondary | ICD-10-CM | POA: Diagnosis not present

## 2016-12-25 DIAGNOSIS — R278 Other lack of coordination: Secondary | ICD-10-CM | POA: Diagnosis not present

## 2016-12-25 DIAGNOSIS — E1136 Type 2 diabetes mellitus with diabetic cataract: Secondary | ICD-10-CM | POA: Diagnosis not present

## 2016-12-25 DIAGNOSIS — H539 Unspecified visual disturbance: Secondary | ICD-10-CM | POA: Diagnosis not present

## 2016-12-25 DIAGNOSIS — R739 Hyperglycemia, unspecified: Secondary | ICD-10-CM | POA: Diagnosis not present

## 2016-12-27 DIAGNOSIS — M6281 Muscle weakness (generalized): Secondary | ICD-10-CM | POA: Diagnosis not present

## 2016-12-27 DIAGNOSIS — R278 Other lack of coordination: Secondary | ICD-10-CM | POA: Diagnosis not present

## 2016-12-27 DIAGNOSIS — E1136 Type 2 diabetes mellitus with diabetic cataract: Secondary | ICD-10-CM | POA: Diagnosis not present

## 2016-12-27 DIAGNOSIS — H539 Unspecified visual disturbance: Secondary | ICD-10-CM | POA: Diagnosis not present

## 2016-12-27 DIAGNOSIS — R739 Hyperglycemia, unspecified: Secondary | ICD-10-CM | POA: Diagnosis not present

## 2016-12-28 DIAGNOSIS — M6281 Muscle weakness (generalized): Secondary | ICD-10-CM | POA: Diagnosis not present

## 2016-12-28 DIAGNOSIS — R7309 Other abnormal glucose: Secondary | ICD-10-CM | POA: Diagnosis not present

## 2016-12-28 DIAGNOSIS — F418 Other specified anxiety disorders: Secondary | ICD-10-CM | POA: Diagnosis not present

## 2016-12-28 DIAGNOSIS — R739 Hyperglycemia, unspecified: Secondary | ICD-10-CM | POA: Diagnosis not present

## 2016-12-28 DIAGNOSIS — R278 Other lack of coordination: Secondary | ICD-10-CM | POA: Diagnosis not present

## 2016-12-28 DIAGNOSIS — E1136 Type 2 diabetes mellitus with diabetic cataract: Secondary | ICD-10-CM | POA: Diagnosis not present

## 2016-12-28 DIAGNOSIS — H548 Legal blindness, as defined in USA: Secondary | ICD-10-CM | POA: Diagnosis not present

## 2016-12-28 DIAGNOSIS — H539 Unspecified visual disturbance: Secondary | ICD-10-CM | POA: Diagnosis not present

## 2016-12-28 DIAGNOSIS — I10 Essential (primary) hypertension: Secondary | ICD-10-CM | POA: Diagnosis not present

## 2016-12-29 DIAGNOSIS — E1136 Type 2 diabetes mellitus with diabetic cataract: Secondary | ICD-10-CM | POA: Diagnosis not present

## 2016-12-29 DIAGNOSIS — R278 Other lack of coordination: Secondary | ICD-10-CM | POA: Diagnosis not present

## 2016-12-29 DIAGNOSIS — R739 Hyperglycemia, unspecified: Secondary | ICD-10-CM | POA: Diagnosis not present

## 2016-12-29 DIAGNOSIS — H539 Unspecified visual disturbance: Secondary | ICD-10-CM | POA: Diagnosis not present

## 2016-12-29 DIAGNOSIS — M6281 Muscle weakness (generalized): Secondary | ICD-10-CM | POA: Diagnosis not present

## 2016-12-30 DIAGNOSIS — R739 Hyperglycemia, unspecified: Secondary | ICD-10-CM | POA: Diagnosis not present

## 2016-12-30 DIAGNOSIS — E08319 Diabetes mellitus due to underlying condition with unspecified diabetic retinopathy without macular edema: Secondary | ICD-10-CM | POA: Diagnosis not present

## 2016-12-30 DIAGNOSIS — H539 Unspecified visual disturbance: Secondary | ICD-10-CM | POA: Diagnosis not present

## 2016-12-30 DIAGNOSIS — M6281 Muscle weakness (generalized): Secondary | ICD-10-CM | POA: Diagnosis not present

## 2016-12-30 DIAGNOSIS — R278 Other lack of coordination: Secondary | ICD-10-CM | POA: Diagnosis not present

## 2016-12-30 DIAGNOSIS — E1136 Type 2 diabetes mellitus with diabetic cataract: Secondary | ICD-10-CM | POA: Diagnosis not present

## 2016-12-31 DIAGNOSIS — M6281 Muscle weakness (generalized): Secondary | ICD-10-CM | POA: Diagnosis not present

## 2016-12-31 DIAGNOSIS — R739 Hyperglycemia, unspecified: Secondary | ICD-10-CM | POA: Diagnosis not present

## 2016-12-31 DIAGNOSIS — H539 Unspecified visual disturbance: Secondary | ICD-10-CM | POA: Diagnosis not present

## 2016-12-31 DIAGNOSIS — E1136 Type 2 diabetes mellitus with diabetic cataract: Secondary | ICD-10-CM | POA: Diagnosis not present

## 2016-12-31 DIAGNOSIS — R278 Other lack of coordination: Secondary | ICD-10-CM | POA: Diagnosis not present

## 2017-01-01 DIAGNOSIS — H539 Unspecified visual disturbance: Secondary | ICD-10-CM | POA: Diagnosis not present

## 2017-01-01 DIAGNOSIS — E1136 Type 2 diabetes mellitus with diabetic cataract: Secondary | ICD-10-CM | POA: Diagnosis not present

## 2017-01-01 DIAGNOSIS — M6281 Muscle weakness (generalized): Secondary | ICD-10-CM | POA: Diagnosis not present

## 2017-01-01 DIAGNOSIS — R278 Other lack of coordination: Secondary | ICD-10-CM | POA: Diagnosis not present

## 2017-01-01 DIAGNOSIS — R739 Hyperglycemia, unspecified: Secondary | ICD-10-CM | POA: Diagnosis not present

## 2017-01-04 DIAGNOSIS — R739 Hyperglycemia, unspecified: Secondary | ICD-10-CM | POA: Diagnosis not present

## 2017-01-04 DIAGNOSIS — E1136 Type 2 diabetes mellitus with diabetic cataract: Secondary | ICD-10-CM | POA: Diagnosis not present

## 2017-01-04 DIAGNOSIS — F418 Other specified anxiety disorders: Secondary | ICD-10-CM | POA: Diagnosis not present

## 2017-01-04 DIAGNOSIS — M6281 Muscle weakness (generalized): Secondary | ICD-10-CM | POA: Diagnosis not present

## 2017-01-04 DIAGNOSIS — R278 Other lack of coordination: Secondary | ICD-10-CM | POA: Diagnosis not present

## 2017-01-04 DIAGNOSIS — H548 Legal blindness, as defined in USA: Secondary | ICD-10-CM | POA: Diagnosis not present

## 2017-01-04 DIAGNOSIS — H539 Unspecified visual disturbance: Secondary | ICD-10-CM | POA: Diagnosis not present

## 2017-01-04 DIAGNOSIS — R7309 Other abnormal glucose: Secondary | ICD-10-CM | POA: Diagnosis not present

## 2017-01-04 DIAGNOSIS — I10 Essential (primary) hypertension: Secondary | ICD-10-CM | POA: Diagnosis not present

## 2017-01-05 DIAGNOSIS — H539 Unspecified visual disturbance: Secondary | ICD-10-CM | POA: Diagnosis not present

## 2017-01-05 DIAGNOSIS — M6281 Muscle weakness (generalized): Secondary | ICD-10-CM | POA: Diagnosis not present

## 2017-01-05 DIAGNOSIS — E1136 Type 2 diabetes mellitus with diabetic cataract: Secondary | ICD-10-CM | POA: Diagnosis not present

## 2017-01-05 DIAGNOSIS — R739 Hyperglycemia, unspecified: Secondary | ICD-10-CM | POA: Diagnosis not present

## 2017-01-05 DIAGNOSIS — R278 Other lack of coordination: Secondary | ICD-10-CM | POA: Diagnosis not present

## 2017-01-06 DIAGNOSIS — E1136 Type 2 diabetes mellitus with diabetic cataract: Secondary | ICD-10-CM | POA: Diagnosis not present

## 2017-01-06 DIAGNOSIS — M6281 Muscle weakness (generalized): Secondary | ICD-10-CM | POA: Diagnosis not present

## 2017-01-06 DIAGNOSIS — H539 Unspecified visual disturbance: Secondary | ICD-10-CM | POA: Diagnosis not present

## 2017-01-06 DIAGNOSIS — R739 Hyperglycemia, unspecified: Secondary | ICD-10-CM | POA: Diagnosis not present

## 2017-01-06 DIAGNOSIS — R278 Other lack of coordination: Secondary | ICD-10-CM | POA: Diagnosis not present

## 2017-01-07 DIAGNOSIS — R278 Other lack of coordination: Secondary | ICD-10-CM | POA: Diagnosis not present

## 2017-01-07 DIAGNOSIS — E1136 Type 2 diabetes mellitus with diabetic cataract: Secondary | ICD-10-CM | POA: Diagnosis not present

## 2017-01-07 DIAGNOSIS — H539 Unspecified visual disturbance: Secondary | ICD-10-CM | POA: Diagnosis not present

## 2017-01-07 DIAGNOSIS — R739 Hyperglycemia, unspecified: Secondary | ICD-10-CM | POA: Diagnosis not present

## 2017-01-07 DIAGNOSIS — M6281 Muscle weakness (generalized): Secondary | ICD-10-CM | POA: Diagnosis not present

## 2017-01-08 DIAGNOSIS — M6281 Muscle weakness (generalized): Secondary | ICD-10-CM | POA: Diagnosis not present

## 2017-01-08 DIAGNOSIS — R278 Other lack of coordination: Secondary | ICD-10-CM | POA: Diagnosis not present

## 2017-01-08 DIAGNOSIS — E1136 Type 2 diabetes mellitus with diabetic cataract: Secondary | ICD-10-CM | POA: Diagnosis not present

## 2017-01-08 DIAGNOSIS — H539 Unspecified visual disturbance: Secondary | ICD-10-CM | POA: Diagnosis not present

## 2017-01-08 DIAGNOSIS — R739 Hyperglycemia, unspecified: Secondary | ICD-10-CM | POA: Diagnosis not present

## 2017-01-23 IMAGING — CR DG HIP (WITH OR WITHOUT PELVIS) 1V PORT*R*
2 series · 2 of 2 positions shown · non-contrast
Comparison: 12/31/2009.

CLINICAL DATA: RIGHT total hip arthroplasty. Revision RIGHT total
hip arthroplasty.

EXAM:
RIGHT HIP (WITH PELVIS) 1 VIEW PORTABLE

[AP]
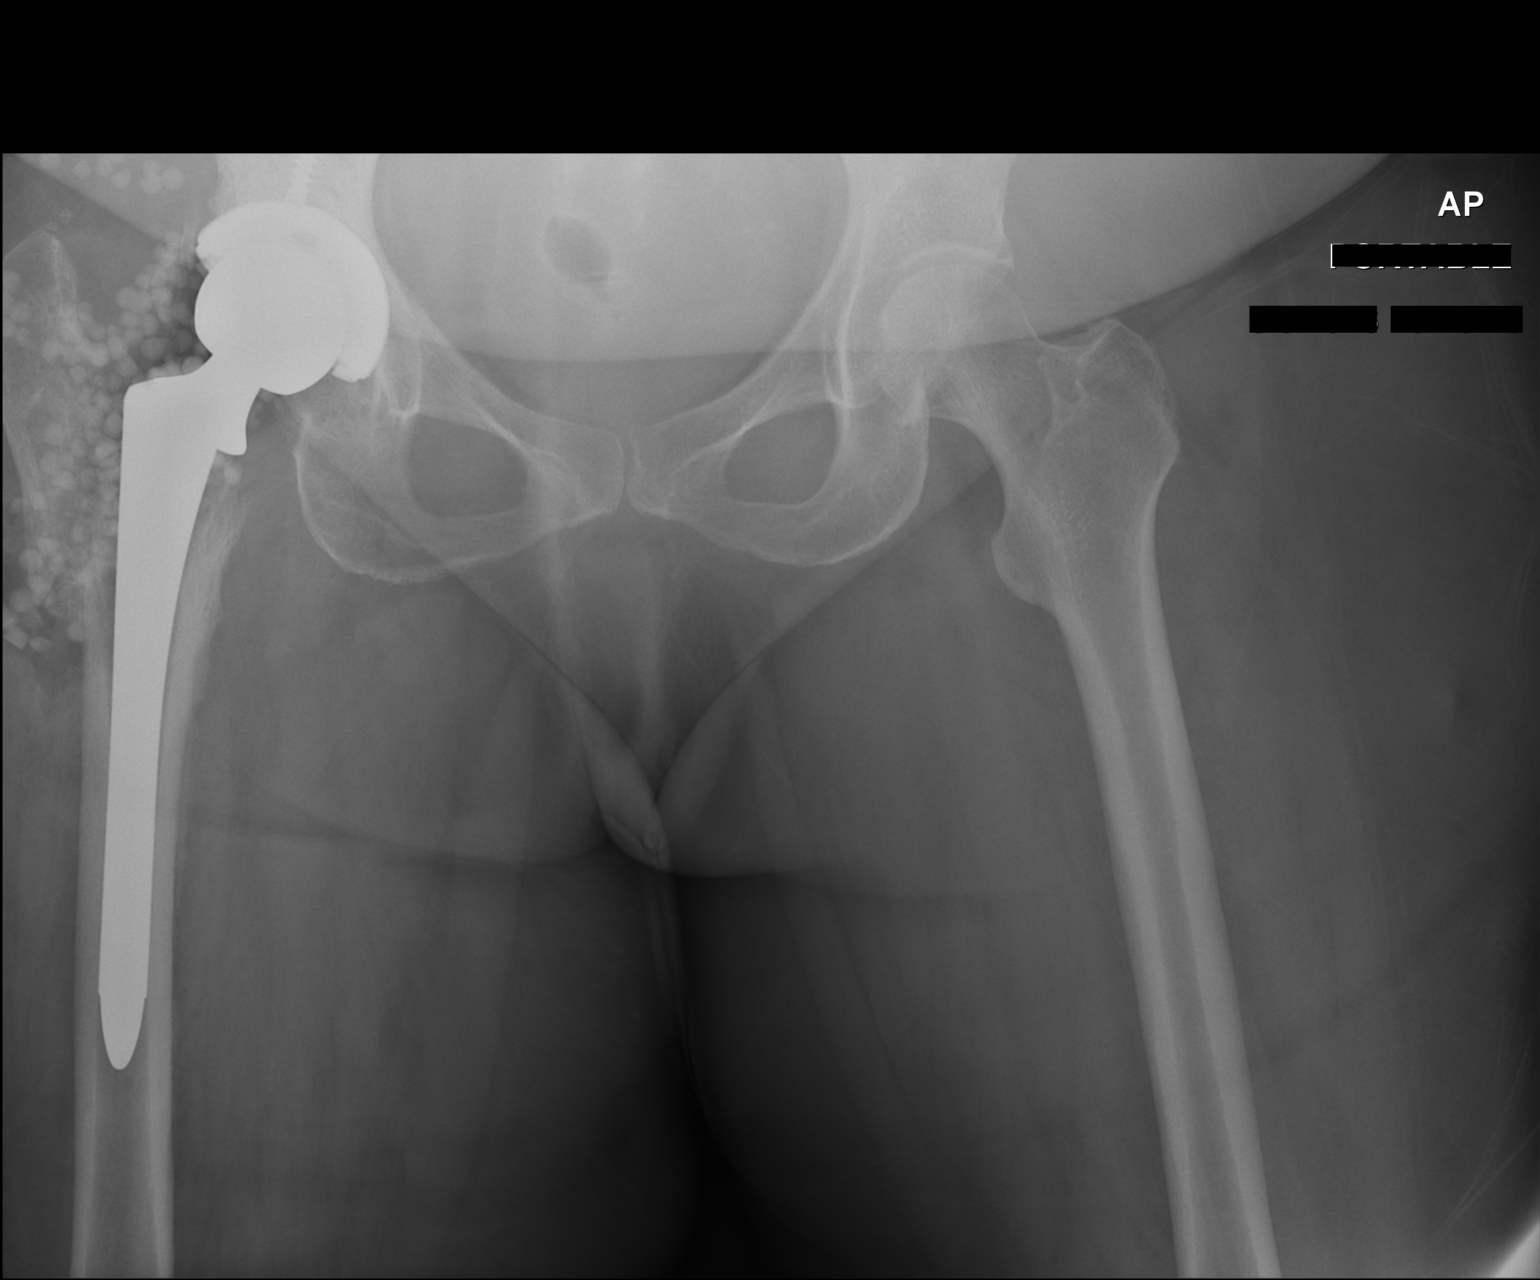

[lateral]
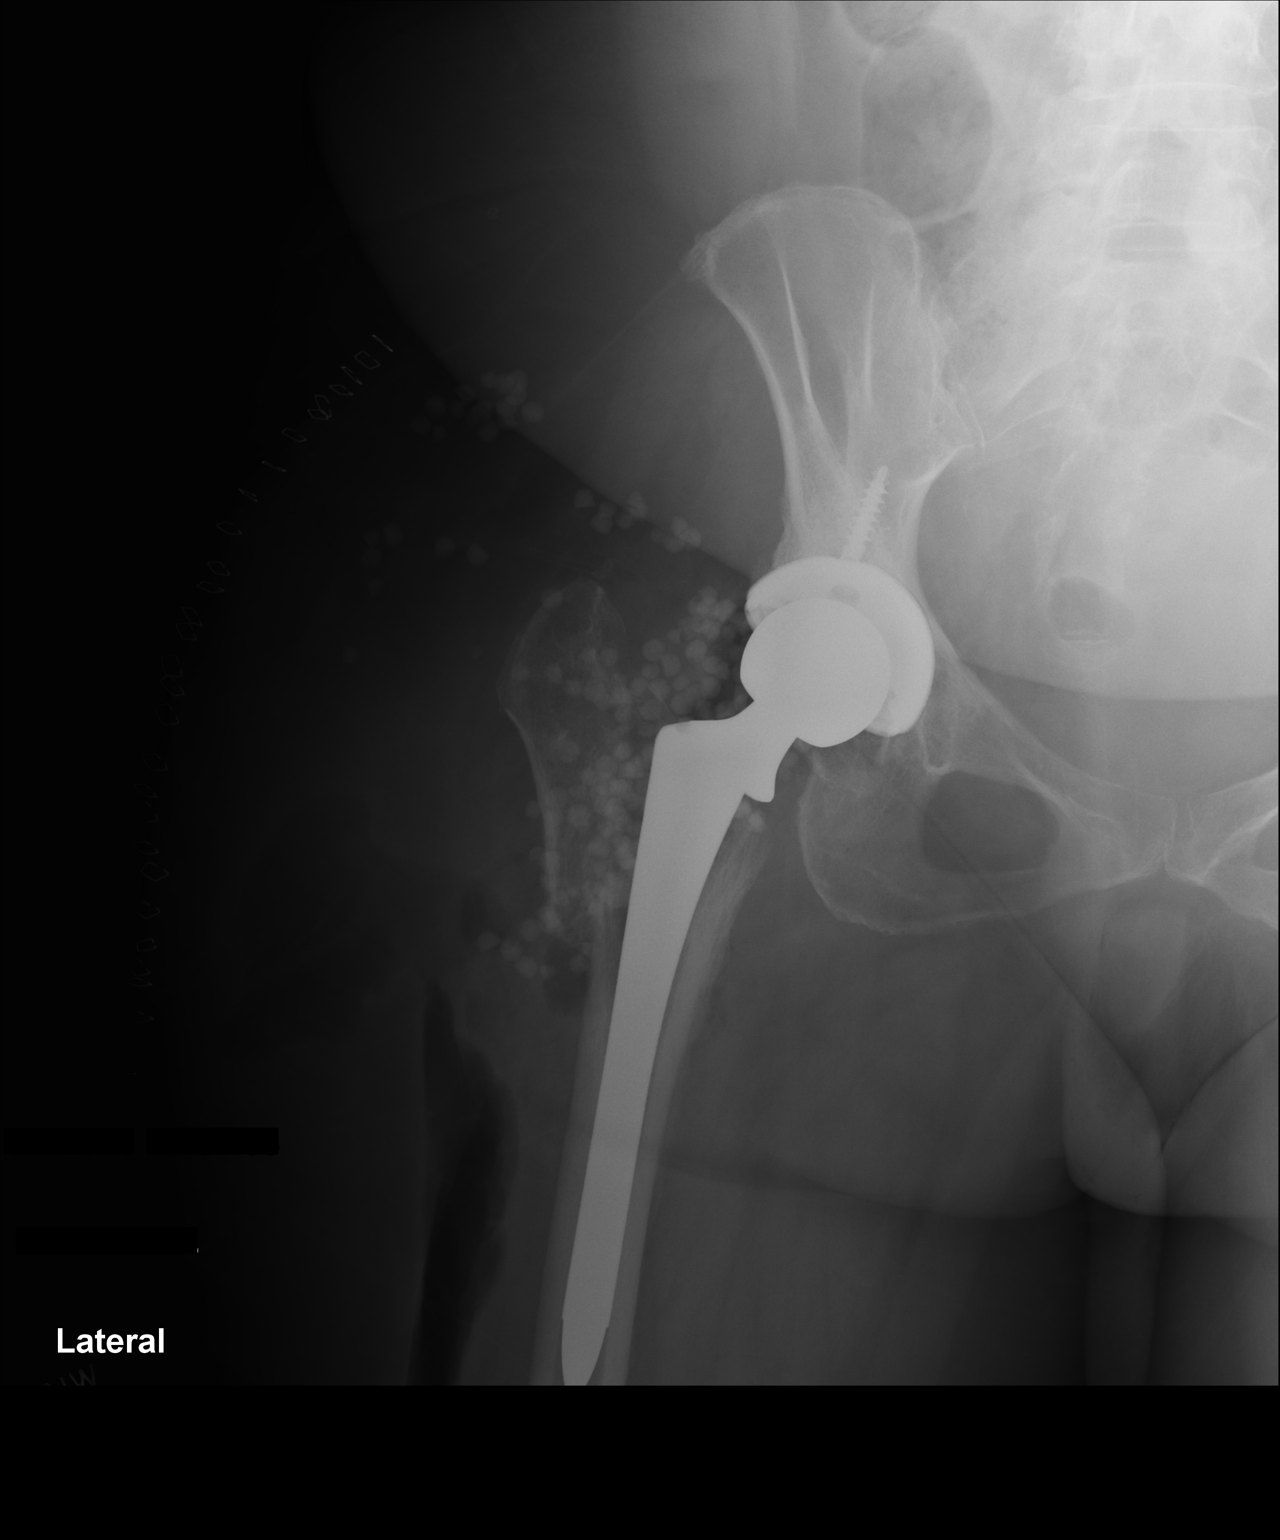

[2 of 2 positions shown; findings below may reference images not displayed]

FINDINGS: Images demonstrate revision RIGHT total hip arthroplasty. There is a
laterally displaced fragment of the proximal femur. This includes
the greater trochanter and extends to the sub trochanteric region.
The femoral stem appears within normal limits. Acetabular cup
appears normal. Antibiotic impregnated beads are present about the
RIGHT hip. Expected postsurgical changes in the soft tissues.
IMPRESSION: Revision RIGHT total hip arthroplasty with antibiotic bead
placement. Fracture of the proximal femur with lateral displacement.

## 2017-06-24 DIAGNOSIS — D649 Anemia, unspecified: Secondary | ICD-10-CM | POA: Diagnosis not present

## 2017-06-24 DIAGNOSIS — I10 Essential (primary) hypertension: Secondary | ICD-10-CM | POA: Diagnosis not present

## 2017-06-24 DIAGNOSIS — Z1159 Encounter for screening for other viral diseases: Secondary | ICD-10-CM | POA: Diagnosis not present

## 2017-06-24 DIAGNOSIS — E1165 Type 2 diabetes mellitus with hyperglycemia: Secondary | ICD-10-CM | POA: Diagnosis not present

## 2017-06-24 DIAGNOSIS — Z794 Long term (current) use of insulin: Secondary | ICD-10-CM | POA: Diagnosis not present

## 2017-07-09 ENCOUNTER — Emergency Department (HOSPITAL_COMMUNITY)
Admission: EM | Admit: 2017-07-09 | Discharge: 2017-07-09 | Disposition: A | Discharge status: Home / Self Care | Payer: Medicare Other | Attending: Emergency Medicine | Admitting: Emergency Medicine

## 2017-07-09 ENCOUNTER — Encounter (HOSPITAL_COMMUNITY): Payer: Self-pay

## 2017-07-09 ENCOUNTER — Other Ambulatory Visit: Payer: Self-pay

## 2017-07-09 DIAGNOSIS — I10 Essential (primary) hypertension: Secondary | ICD-10-CM | POA: Diagnosis not present

## 2017-07-09 DIAGNOSIS — K920 Hematemesis: Secondary | ICD-10-CM | POA: Diagnosis not present

## 2017-07-09 DIAGNOSIS — Z794 Long term (current) use of insulin: Secondary | ICD-10-CM | POA: Diagnosis not present

## 2017-07-09 DIAGNOSIS — Z96641 Presence of right artificial hip joint: Secondary | ICD-10-CM | POA: Insufficient documentation

## 2017-07-09 DIAGNOSIS — R739 Hyperglycemia, unspecified: Secondary | ICD-10-CM | POA: Insufficient documentation

## 2017-07-09 DIAGNOSIS — R1110 Vomiting, unspecified: Secondary | ICD-10-CM | POA: Diagnosis present

## 2017-07-09 DIAGNOSIS — E119 Type 2 diabetes mellitus without complications: Secondary | ICD-10-CM | POA: Diagnosis not present

## 2017-07-09 DIAGNOSIS — Z91040 Latex allergy status: Secondary | ICD-10-CM | POA: Diagnosis not present

## 2017-07-09 LAB — URINALYSIS, ROUTINE W REFLEX MICROSCOPIC
Bilirubin Urine: NEGATIVE
HGB URINE DIPSTICK: NEGATIVE
KETONES UR: 20 mg/dL — AB
LEUKOCYTES UA: NEGATIVE
NITRITE: NEGATIVE
PH: 5 (ref 5.0–8.0)
PROTEIN: NEGATIVE mg/dL
Specific Gravity, Urine: 1.026 (ref 1.005–1.030)

## 2017-07-09 LAB — TYPE AND SCREEN
ABO/RH(D): A POS
Antibody Screen: NEGATIVE

## 2017-07-09 LAB — CBC
HCT: 31.8 % — ABNORMAL LOW (ref 36.0–46.0)
HEMOGLOBIN: 8.2 g/dL — AB (ref 12.0–15.0)
MCH: 15.5 pg — ABNORMAL LOW (ref 26.0–34.0)
MCHC: 25.8 g/dL — AB (ref 30.0–36.0)
MCV: 60.1 fL — AB (ref 78.0–100.0)
PLATELETS: 446 10*3/uL — AB (ref 150–400)
RBC: 5.29 MIL/uL — ABNORMAL HIGH (ref 3.87–5.11)
RDW: 18.5 % — ABNORMAL HIGH (ref 11.5–15.5)
WBC: 8.4 10*3/uL (ref 4.0–10.5)

## 2017-07-09 LAB — HEPATIC FUNCTION PANEL
ALBUMIN: 2.9 g/dL — AB (ref 3.5–5.0)
ALK PHOS: 104 U/L (ref 38–126)
ALT: 27 U/L (ref 14–54)
AST: 39 U/L (ref 15–41)
BILIRUBIN TOTAL: 0.4 mg/dL (ref 0.3–1.2)
Bilirubin, Direct: <0.1 mg/dL — ABNORMAL LOW (ref 0.1–0.5)
Total Protein: 7 g/dL (ref 6.5–8.1)

## 2017-07-09 LAB — CBG MONITORING, ED
GLUCOSE-CAPILLARY: 373 mg/dL — AB (ref 65–99)
Glucose-Capillary: 390 mg/dL — ABNORMAL HIGH (ref 65–99)
Glucose-Capillary: 313 mg/dL — ABNORMAL HIGH (ref 65–99)
Glucose-Capillary: 286 mg/dL — ABNORMAL HIGH (ref 65–99)

## 2017-07-09 LAB — RAPID URINE DRUG SCREEN, HOSP PERFORMED
Amphetamines: NOT DETECTED
BARBITURATES: NOT DETECTED
BENZODIAZEPINES: NOT DETECTED
Cocaine: NOT DETECTED
Opiates: NOT DETECTED
TETRAHYDROCANNABINOL: NOT DETECTED

## 2017-07-09 LAB — BASIC METABOLIC PANEL
Anion gap: 12 (ref 5–15)
BUN: 13 mg/dL (ref 6–20)
CALCIUM: 9 mg/dL (ref 8.9–10.3)
CHLORIDE: 99 mmol/L — AB (ref 101–111)
CO2: 22 mmol/L (ref 22–32)
CREATININE: 0.89 mg/dL (ref 0.44–1.00)
GFR calc Af Amer: >60 mL/min (ref >60)
GFR calc non Af Amer: >60 mL/min (ref >60)
GLUCOSE: 420 mg/dL — AB (ref 65–99)
Potassium: 4.2 mmol/L (ref 3.5–5.1)
Sodium: 133 mmol/L — ABNORMAL LOW (ref 135–145)

## 2017-07-09 LAB — POC OCCULT BLOOD, ED: FECAL OCCULT BLD: NEGATIVE

## 2017-07-09 LAB — I-STAT TROPONIN, ED: TROPONIN I, POC: 0 ng/mL (ref 0.00–0.08)

## 2017-07-09 LAB — LIPASE, BLOOD: Lipase: 35 U/L (ref 11–51)

## 2017-07-09 MED ORDER — SODIUM CHLORIDE 0.9 % IV SOLN
INTRAVENOUS | Status: DC
  Administered 2017-07-09: 18:00:00 via INTRAVENOUS

## 2017-07-09 MED ORDER — SODIUM CHLORIDE 0.9 % IV BOLUS (SEPSIS)
1000.0000 mL | Freq: Once | INTRAVENOUS | Status: AC
  Administered 2017-07-09: 1000 mL via INTRAVENOUS

## 2017-07-09 MED ORDER — INSULIN ASPART 100 UNIT/ML ~~LOC~~ SOLN: 0.0000 [IU] | Freq: Three times a day (TID) | SUBCUTANEOUS | Status: DC

## 2017-07-09 MED ORDER — DEXTROSE-NACL 5-0.45 % IV SOLN: INTRAVENOUS | Status: DC

## 2017-07-09 MED ORDER — ONDANSETRON HCL 4 MG/2ML IJ SOLN
4.0000 mg | Freq: Once | INTRAMUSCULAR | Status: AC
  Administered 2017-07-09: 4 mg via INTRAVENOUS
  Filled 2017-07-09: qty 2

## 2017-07-09 MED ORDER — FAMOTIDINE IN NACL 20-0.9 MG/50ML-% IV SOLN
20.0000 mg | Freq: Once | INTRAVENOUS | Status: AC
  Administered 2017-07-09: 20 mg via INTRAVENOUS
  Filled 2017-07-09: qty 50

## 2017-07-09 MED ORDER — PANTOPRAZOLE SODIUM 40 MG IV SOLR
40.0000 mg | Freq: Once | INTRAVENOUS | Status: AC
  Administered 2017-07-09: 40 mg via INTRAVENOUS
  Filled 2017-07-09: qty 40

## 2017-07-09 NOTE — ED Notes (Signed)
Patient is resting comfortably. 

## 2017-07-09 NOTE — ED Notes (Signed)
Pt stated that she did not wish to wait on [facility]AR and that she would go home with her boyfriend. Pt transported to boyfriend's car in wheelchair and transferred without difficulty

## 2017-07-09 NOTE — ED Triage Notes (Signed)
[facility] arrived [facility], from doctor's office. Per EMS, staff at doctor's office called because they found patient vomiting in waiting room, assessment of CBG per EMS was "high"  Pt is A&O X 4, not a good historian of medication adherence or self care. Patient's response to most questions is "Im not sure, Raiford NobleRick is coming, we live together". PT states "I think I remember feeling sick" Denies pain, denies nausea.

## 2017-07-09 NOTE — ED Notes (Signed)
Patient denies pain and is resting comfortably.  

## 2017-07-09 NOTE — ED Provider Notes (Signed)
[facility] EMERGENCY DEPARTMENT Provider Note   CSN: [id number] Arrival date & time: 07/09/17  1551     History   Chief Complaint Chief Complaint  Patient presents with  . Vomiting  . Hyperglycemia    HPI Kelly Richardson is a 67 y.o. female with history of hypertension, diabetes on insulin, anemia, medical noncompliance, poorly controlled diabetes presents to ED for vomiting at PCP office. Pt states "I don't know what happened" but remembers feeling like she was going to throw up and fall out of her chair. Then remembers waking up to EMS. She currently denies any symptoms and feels fine, no HA, vision changes, CP, SOB, abdominal pain, nausea, vomiting, dysuria, diarrhea or constipation. She is unsure if she has been taken any medications. When asked if she has taken her medications she states she can't think or remember right now about her medications. States "Raiford Noble knows". Raiford Noble is her boyfriend who lives with her.   No h/o ETOH abuse, seizures, anticoagulation. She is unsure about falls or head trauma PTA.  HPI  Past Medical History:  Diagnosis Date  . Arthritis   . Complication of anesthesia   . Diabetes mellitus without complication (HCC)   . Heart murmur    as a child  . Hypertension   . PONV (postoperative nausea and vomiting)   . Seizures (HCC)   . Shortness of breath dyspnea    sometimes  at night    Patient Active Problem List   Diagnosis Date Noted  . Health care maintenance 04/09/2015  . H/O total hip arthroplasty 11/09/2014  . Osteomyelitis of right hip (HCC) 07/12/2014  . Chest pain 06/22/2014  . Anemia due to blood loss 06/20/2014  . Status post revision of total hip replacement 06/08/2014  . Anxiety state 03/05/2008  . FATIGUE 03/05/2008  . Uncontrolled insulin dependent diabetes mellitus (HCC) 01/02/2008  . Hyperlipidemia 01/02/2008  . Essential hypertension 01/02/2008    Past Surgical History:  Procedure Laterality Date  .  ABDOMINAL HYSTERECTOMY    . CHOLECYSTECTOMY  1995  . COLONOSCOPY    . EXCISIONAL TOTAL HIP ARTHROPLASTY WITH ANTIBIOTIC SPACERS Right 09/07/2014   Procedure: Excision Total Hip Arthroplasty, Place Antibiotic Spacer;  Surgeon: Nadara Mustard, MD;  Location: [facility] OR;  Service: Orthopedics;  Laterality: Right;  . INCISION AND DRAINAGE HIP Right 06/08/2014   Procedure: Irrigation and Debridement Right Hip, Revision of Cup and Head of Total Hip with  Placement  of Antibiotic Spacer;  Surgeon: Nadara Mustard, MD;  Location: [facility] OR;  Service: Orthopedics;  Laterality: Right;  . TONSILLECTOMY    . TOTAL HIP ARTHROPLASTY Right 2011  . TOTAL HIP REVISION Right 11/09/2014   Procedure: TOTAL HIP REVISION;  Surgeon: Nadara Mustard, MD;  Location: [facility] OR;  Service: Orthopedics;  Laterality: Right;    OB History    No data available       Home Medications    Prior to Admission medications   Medication Sig Start Date End Date Taking? Authorizing Provider  lisinopril (PRINIVIL,ZESTRIL) 2.5 MG tablet Take 2.5 mg by mouth daily. 06/24/17  Yes [provider]  Insulin Detemir (LEVEMIR FLEXTOUCH) 100 UNIT/ML Pen Inject 40 Units into the skin at bedtime. 12/09/16   Pricilla Loveless, MD    Family History History reviewed. No pertinent family history.  Social History Social History   Tobacco Use  . Smoking status: Never Smoker  . Smokeless tobacco: Never Used  Substance Use Topics  . Alcohol  use: No    Alcohol/week: 0.0 oz  . Drug use: No     Allergies   Metformin; Penicillins; Seldane [terfenadine]; Sulfonamide derivatives; Vicks formula 44 cough relief [dextromethorphan hbr]; Xanax [alprazolam]; Iohexol; Latex; Other; and Percocet [oxycodone-acetaminophen]   Review of Systems Review of Systems  Gastrointestinal: Positive for vomiting (resolved).  All other systems reviewed and are negative.    Physical Exam Updated Vital Signs BP 119/74   Pulse (!) 102   Temp 98.1 F (36.7 C) (Oral)    Resp (!) 21   Ht 5\' 2"  (1.575 m)   Wt 82.6 kg (182 lb)   SpO2 99%   BMI 33.29 kg/m   Physical Exam  Constitutional: She is oriented to person, place, and time. She appears well-developed and well-nourished. No distress.  Non toxic. Poor hygiene.   HENT:  Head: Normocephalic.  Nose: Nose normal.  Mouth/Throat: Mucous membranes are dry. No oropharyngeal exudate.  Dry lips, moist buccal mucous membranes. Atraumatic.   Eyes: Conjunctivae and EOM are normal. Pupils are equal, round, and reactive to light.  Neck: Normal range of motion.  No midline c-spine tenderness  Cardiovascular: Normal rate, regular rhythm and intact distal pulses.  No murmur heard. 2+ DP and radial pulses bilaterally. No LE edema.   Pulmonary/Chest: Effort normal and breath sounds normal. No respiratory distress. She has no wheezes. She has no rales.  Abdominal: Soft. Bowel sounds are normal. There is no tenderness.  No G/R/R. No suprapubic or CVA tenderness.   Genitourinary:  Genitourinary Comments:  Chaperone was present.  I was able to palpate the first 2-3cm of the rectum digitally without gross abnormalities or masses. Patient able to tolerate examination.  Patient without pain in perianal/rectal area with palpation.  There are no external fissures or external hemorrhoids noted.  No induration or swelling of the perianal skin.   Stool color is brown with no gross blood noted.   No signs of perirectal abscess.   DRE reveals good sphincter tone.    Musculoskeletal: Normal range of motion. She exhibits no deformity.  Neurological: She is alert and oriented to person, place, and time.  Alert and oriented to self, place, time and event.  Speech is fluent without aphasia. Strength 5/5 with hand grip and ankle F/E.   Sensation to light touch intact in hands and feet. Normal gait. No pronator drift.  Normal finger-to-nose and heel-to-shin test.  CN I and VIII not tested. CN II-XII intact bilaterally.   Skin:  Skin is warm and dry. Capillary refill takes less than 2 seconds.  Mud/dirty all over feel and lower extremities   Psychiatric: She has a normal mood and affect. Her behavior is normal. Judgment and thought content normal.  Nursing note and vitals reviewed.    ED Treatments / Results  Labs (all labs ordered are listed, but only abnormal results are displayed) Labs Reviewed  BASIC METABOLIC PANEL - Abnormal; Notable for the following components:      Result Value   Sodium 133 (*)    Chloride 99 (*)    Glucose, Bld 420 (*)    All other components within normal limits  CBC - Abnormal; Notable for the following components:   RBC 5.29 (*)    Hemoglobin 8.2 (*)    HCT 31.8 (*)    MCV 60.1 (*)    MCH 15.5 (*)    MCHC 25.8 (*)    RDW 18.5 (*)    Platelets 446 (*)    All  other components within normal limits  URINALYSIS, ROUTINE W REFLEX MICROSCOPIC - Abnormal; Notable for the following components:   APPearance HAZY (*)    Glucose, UA >=500 (*)    Ketones, ur 20 (*)    Bacteria, UA RARE (*)    Squamous Epithelial / LPF 0-5 (*)    All other components within normal limits  HEPATIC FUNCTION PANEL - Abnormal; Notable for the following components:   Albumin 2.9 (*)    Bilirubin, Direct <0.1 (*)    All other components within normal limits  CBG MONITORING, ED - Abnormal; Notable for the following components:   Glucose-Capillary 390 (*)    All other components within normal limits  CBG MONITORING, ED - Abnormal; Notable for the following components:   Glucose-Capillary 373 (*)    All other components within normal limits  CBG MONITORING, ED - Abnormal; Notable for the following components:   Glucose-Capillary 313 (*)    All other components within normal limits  CBG MONITORING, ED - Abnormal; Notable for the following components:   Glucose-Capillary 286 (*)    All other components within normal limits  RAPID URINE DRUG SCREEN, HOSP PERFORMED  LIPASE, BLOOD  I-STAT TROPONIN, ED    POC OCCULT BLOOD, ED  TYPE AND SCREEN    EKG  EKG Interpretation  Date/Time:  Friday July 09 2017 15:57:50 EST Ventricular Rate:  110 PR Interval:    QRS Duration: 94 QT Interval:  328 QTC Calculation: 444 R Axis:   70 Text Interpretation:  Sinus tachycardia Low voltage, precordial leads since last tracing no significant change Confirmed by [name] [phone]) on 07/09/2017 4:39:51 PM       Radiology No results found.  Procedures Procedures (including critical care time)  Medications Ordered in ED Medications  0.9 %  sodium chloride infusion ( Intravenous New Bag/Given 07/09/17 1743)  dextrose 5 %-0.45 % sodium chloride infusion ( Intravenous Not Given 07/09/17 2243)  insulin aspart (novoLOG) injection 0-15 Units (not administered)  sodium chloride 0.9 % bolus 1,000 mL (0 mLs Intravenous Stopped 07/09/17 1949)  sodium chloride 0.9 % bolus 1,000 mL (0 mLs Intravenous Stopped 07/09/17 1843)  ondansetron (ZOFRAN) injection 4 mg (4 mg Intravenous Given 07/09/17 1924)  famotidine (PEPCID) IVPB 20 mg premix (0 mg Intravenous Stopped 07/09/17 1951)  pantoprazole (PROTONIX) injection 40 mg (40 mg Intravenous Given 07/09/17 1921)     Initial Impression / Assessment and Plan / ED Course  I have reviewed the triage vital signs and the nursing notes.  Pertinent labs & imaging results that were available during my care of the patient were reviewed by me and considered in my medical decision making (see chart for details).  Clinical Course as of Jul 10 2339  Fri Jul 09, 2017  1640 Hemoglobin: (!) 8.2 [CG]  1640 Glucose-Capillary: (!) 390 [CG]  1845 Reevaluated patient, discussed benign ED workup so far. She is unsure of her baseline hemoglobin, mentioned that it is lower today than it has been in the past. Plan to ambulate  [CG]  1913 Nurse notified me that patient began having bloody emesis. I evaluated patient during this time, she denies history of GI bleeds, ulcers, EtOH use or high use  of NSAIDs.  [CG]    Clinical Course User Index [CG] Liberty Handy, PA-C   67 year old female presents from PCP office for vomiting. HPI is limited as patient only remembers feeling nauseated and like she was going to fall off her chair prior. She is  unsure if she passed out. No documented or witnessed fall or head trauma. Pt not noted to be post-ictal per EMS. No h/o seizures or drug/ETOh abuse.  On my exam, no evidence of head/cervical spine trauma. No intraoral injury or incontinence. Abdomen is nontender. She is tolerating fluids. She has odd affect. She has mud all over her feet states she walks around her yard barefoot. She denies any symptoms to me during initial exam. Is appropriately conversational and pleasant, but as soon as I ask about medical compliance/insulin her affect changes, says "I don't know", "I can't remember" and to ask her boyfriend. She cannot remember if she has taken any medicines or insulin in the last few days. She has long h/o medical non compliance and poorly controlled DM.  Given h/o DM and vomiting, concerned for diabetic emergency. Will get screening labs.  Final Clinical Impressions(s) / ED Diagnoses  1845: Lab work today shows hyperglycemia without evidence of DKA or HHS. Hemoglobin 8.2, only slightly lower than what appears to be patient's baseline hemoglobin ~9. CT head/neck not thought to be indicated today. She has no h/o seizures, no signs of significant head/neck trauma or neuro deficits today. Given reassuring work up and benign repeat evaluation, anticipate d/c.   Pt has blood streaked emesis while in ED prior to discharge. Protonix, pepcid and zofran given which resolved nausea/emesis. Hemoccult negative. No abdominal tenderness on repeat abdominal exam. No chest pain. She ambulated in ED and tolerate PO prior to d/c. Likely mallory weiss tear. Pt considered safe for d/c at this time. No further emergent lab work/imaging indicated at this time. Discussed  return precautions.  Final diagnoses:  Hyperglycemia  Hematemesis with nausea    ED Discharge Orders    None       Jerrell Mylar 07/09/17 2341    [name], MD 07/10/17 [phone]

## 2017-07-09 NOTE — ED Notes (Signed)
Patient denies N/V at this time.

## 2017-07-09 NOTE — ED Notes (Signed)
Pt has tolerated 2 cans of sprite zero well, no vomiting.

## 2017-07-09 NOTE — ED Notes (Signed)
ED Provider at bedside. 

## 2017-07-09 NOTE — ED Notes (Signed)
Patient got up to ambulate, she began to vomit a reddish color emesis. Provider arrived to bedside and witness the event. Meds administered per order(see mar). Patient cleaned, changed gown and all linen ans covers.

## 2017-07-09 NOTE — Discharge Instructions (Signed)
Your lab work showed high blood sugar. This was treated in the emergency department.   I suspect the blood in your vomit today was from recent forceful vomiting causing a small tear to your esophagus.   Soft, mild diet for the next 24-48 hours. Take zofran for nausea every 4-6 hours to prevent more vomiting. Follow up with your primary care doctor in 2-3 days for re-evaluation.   Return for continued vomiting, chest pain, shortness of breath, light-headedness, bloody stools

## 2017-07-09 NOTE — ED Notes (Signed)
D/c reviewed with patient and spouse. No further question at this time

## 2018-03-03 ENCOUNTER — Ambulatory Visit (INDEPENDENT_AMBULATORY_CARE_PROVIDER_SITE_OTHER): Payer: Medicare Other | Admitting: Podiatry

## 2018-03-03 DIAGNOSIS — Z794 Long term (current) use of insulin: Secondary | ICD-10-CM

## 2018-03-03 DIAGNOSIS — E1151 Type 2 diabetes mellitus with diabetic peripheral angiopathy without gangrene: Secondary | ICD-10-CM | POA: Diagnosis not present

## 2018-03-03 DIAGNOSIS — E1165 Type 2 diabetes mellitus with hyperglycemia: Secondary | ICD-10-CM | POA: Diagnosis not present

## 2018-03-03 DIAGNOSIS — B351 Tinea unguium: Secondary | ICD-10-CM | POA: Diagnosis not present

## 2018-03-03 DIAGNOSIS — E119 Type 2 diabetes mellitus without complications: Secondary | ICD-10-CM

## 2018-03-03 DIAGNOSIS — IMO0001 Reserved for inherently not codable concepts without codable children: Secondary | ICD-10-CM

## 2018-03-03 NOTE — Progress Notes (Signed)
Subjective:  Patient ID: Kelly Richardson, female    DOB: June 17, 1951,  MRN: [id number]  Chief Complaint  Patient presents with  . Diabetes    diabetic foot exam  . Nail Problem    elongated thick toenails    67 y.o. female presents with the above complaint. Doesn't check sugars regularly. Denies numbness and tingling in the feet. \ Review of Systems: Negative except as noted in the HPI. Denies N/V/F/Ch.  Past Medical History:  Diagnosis Date  . Arthritis   . Complication of anesthesia   . Diabetes mellitus without complication (HCC)   . Heart murmur    as a child  . Hypertension   . PONV (postoperative nausea and vomiting)   . Seizures (HCC)   . Shortness of breath dyspnea    sometimes  at night    Current Outpatient Medications:  .  Insulin Detemir (LEVEMIR FLEXTOUCH) 100 UNIT/ML Pen, Inject 40 Units into the skin at bedtime., Disp: 15 mL, Rfl: 0 .  lisinopril (PRINIVIL,ZESTRIL) 2.5 MG tablet, Take 2.5 mg by mouth daily., Disp: , Rfl: 2  Social History   Tobacco Use  Smoking Status Never Smoker  Smokeless Tobacco Never Used    Allergies  Allergen Reactions  . Metformin Other (See Comments)    REACTION: faintness, DIZZINESS  . Penicillins Anaphylaxis  . Seldane [Terfenadine] Other (See Comments)    N/V, increased heart rate, seizures, hallinations.  . Sulfonamide Derivatives Anaphylaxis  . Vicks Formula 44 Cough Relief [Dextromethorphan Hbr] Other (See Comments)    Runs blood pressure up   . Xanax [Alprazolam] Other (See Comments)    Pt does not want Xanax--history of addiction.  . Iohexol Other (See Comments)     Desc: pt states she had a ct in [place] in 2000 and immediately experienced profuse vomiting and sweating.  She said they treated her like she was having a reaction and doesn't remember much else or if meds were given., Onset Date: [id number]   . Latex Other (See Comments)    BLISTERING  . Other Nausea And Vomiting    "Anything I have ever been given  more nausea just increases it."  . Percocet [Oxycodone-Acetaminophen] Other (See Comments)    Hallucinations   Objective:  There were no vitals filed for this visit. There is no height or weight on file to calculate BMI. Constitutional Well developed. Well nourished. Poor pedal hygiene.  Vascular Dorsalis pedis pulses palpable bilaterally. Posterior tibial pulses non-palpable bilaterally. Capillary refill normal to all digits.  No cyanosis or clubbing noted. Pedal hair growth normal.  Neurologic Normal speech. Oriented to person, place, and time. Epicritic sensation to light touch grossly present bilaterally.  Dermatologic Nails elongated, dystrophic, malodorous, significant subungual debris. No open wounds. No skin lesions.  Orthopedic: Normal joint ROM without pain or crepitus bilaterally. No visible deformities. No bony tenderness.   Radiographs: None Assessment:   1. Uncontrolled insulin dependent diabetes mellitus (HCC)   2. Diabetes mellitus type 2 with peripheral artery disease (HCC)   3. Onychomycosis   4. Encounter for diabetic foot exam [facility](HCC)    Plan:  Patient was evaluated and treated and all questions answered.  Diabetes with PAD, Onychomycosis -Educated on diabetic footcare. Diabetic risk level 1 -Nails x10 debrided sharply and manually with large nail nipper and rotary burr.   Procedure: Nail Debridement Rationale: Patient meets criteria for routine foot care due to PAD Type of Debridement: manual, sharp debridement. Instrumentation: Nail nipper, rotary burr. Number of Nails: 10  Return if symptoms worsen or fail to improve. 

## 2018-05-06 ENCOUNTER — Emergency Department (HOSPITAL_COMMUNITY)
Admission: EM | Admit: 2018-05-06 | Discharge: 2018-05-06 | Disposition: A | Discharge status: Home / Self Care | Payer: Medicare Other | Attending: Emergency Medicine | Admitting: Emergency Medicine

## 2018-05-06 ENCOUNTER — Emergency Department (HOSPITAL_COMMUNITY): Payer: Medicare Other

## 2018-05-06 ENCOUNTER — Other Ambulatory Visit: Payer: Self-pay

## 2018-05-06 ENCOUNTER — Encounter (HOSPITAL_COMMUNITY): Payer: Self-pay | Admitting: Emergency Medicine

## 2018-05-06 DIAGNOSIS — Z5321 Procedure and treatment not carried out due to patient leaving prior to being seen by health care provider: Secondary | ICD-10-CM | POA: Insufficient documentation

## 2018-05-06 DIAGNOSIS — R079 Chest pain, unspecified: Secondary | ICD-10-CM | POA: Diagnosis not present

## 2018-05-06 LAB — URINALYSIS, ROUTINE W REFLEX MICROSCOPIC
BILIRUBIN URINE: NEGATIVE
Ketones, ur: NEGATIVE mg/dL
Leukocytes, UA: NEGATIVE
Nitrite: NEGATIVE
PH: 5 (ref 5.0–8.0)
Protein, ur: 100 mg/dL — AB
SPECIFIC GRAVITY, URINE: 1.026 (ref 1.005–1.030)

## 2018-05-06 LAB — BASIC METABOLIC PANEL
ANION GAP: 8 (ref 5–15)
BUN: 19 mg/dL (ref 8–23)
CALCIUM: 9.4 mg/dL (ref 8.9–10.3)
CO2: 26 mmol/L (ref 22–32)
CREATININE: 1.17 mg/dL — AB (ref 0.44–1.00)
Chloride: 100 mmol/L (ref 98–111)
GFR calc Af Amer: 56 mL/min — ABNORMAL LOW (ref >60)
GFR calc non Af Amer: 48 mL/min — ABNORMAL LOW (ref >60)
GLUCOSE: 379 mg/dL — AB (ref 70–99)
Potassium: 4.1 mmol/L (ref 3.5–5.1)
Sodium: 134 mmol/L — ABNORMAL LOW (ref 135–145)

## 2018-05-06 LAB — CBC
HCT: 46.4 % — ABNORMAL HIGH (ref 36.0–46.0)
Hemoglobin: 14.5 g/dL (ref 12.0–15.0)
MCH: 27.2 pg (ref 26.0–34.0)
MCHC: 31.3 g/dL (ref 30.0–36.0)
MCV: 86.9 fL (ref 80.0–100.0)
PLATELETS: 253 10*3/uL (ref 150–400)
RBC: 5.34 MIL/uL — AB (ref 3.87–5.11)
RDW: 13.3 % (ref 11.5–15.5)
WBC: 10.9 10*3/uL — ABNORMAL HIGH (ref 4.0–10.5)
nRBC: 0 % (ref 0.0–0.2)

## 2018-05-06 NOTE — ED Notes (Signed)
Pt stated she did not want to stay due to the wait time. Pt also stated she would go to [facility] ED. Pt seen ambulating out of the ED.

## 2018-05-06 NOTE — ED Notes (Signed)
Pt came brought back from x-ray.  Informed transporter patient can wait in waiting room.  Pt states, "If you put me in that waiting room, I am leaving."  Explained triage process and pt states, "you can call me a cab because I am leaving."  Encouraged pt to stay and she declines. Transporter took pt to [facility] to call a taxi for pt.  Nurse First notified of pt's wish to leave.

## 2018-05-06 NOTE — ED Triage Notes (Signed)
Pt to triage via [facility].  C/o dull pain to center of chest that radiates to R arm.  Pt denies nausea, vomiting, and SOB.  EMS states pt initially had nausea at onset of pain but patient denies.  EMS administered ASA 324mg  PTA.  Pt denies urinary complaints but has low grade temperature and strong smell of urine.

## 2018-05-06 NOTE — ED Notes (Signed)
Pt states she is leaving bc she was placed in the waiting room. IV removed by Luisa Hart NT. Cab called.

## 2018-05-08 LAB — URINE CULTURE

## 2018-05-09 LAB — I-STAT TROPONIN, ED: TROPONIN I, POC: 0 ng/mL (ref 0.00–0.08)

## 2018-08-07 DIAGNOSIS — E669 Obesity, unspecified: Secondary | ICD-10-CM | POA: Insufficient documentation

## 2019-05-18 ENCOUNTER — Emergency Department (HOSPITAL_COMMUNITY): Payer: Medicare Other

## 2019-05-18 ENCOUNTER — Emergency Department (HOSPITAL_COMMUNITY)
Admission: EM | Admit: 2019-05-18 | Discharge: 2019-05-18 | Disposition: A | Discharge status: Home / Self Care | Payer: Medicare Other | Attending: Emergency Medicine | Admitting: Emergency Medicine

## 2019-05-18 ENCOUNTER — Other Ambulatory Visit: Payer: Self-pay

## 2019-05-18 ENCOUNTER — Encounter (HOSPITAL_COMMUNITY): Payer: Self-pay | Admitting: Emergency Medicine

## 2019-05-18 DIAGNOSIS — W01198A Fall on same level from slipping, tripping and stumbling with subsequent striking against other object, initial encounter: Secondary | ICD-10-CM | POA: Insufficient documentation

## 2019-05-18 DIAGNOSIS — S32020A Wedge compression fracture of second lumbar vertebra, initial encounter for closed fracture: Secondary | ICD-10-CM | POA: Diagnosis not present

## 2019-05-18 DIAGNOSIS — K449 Diaphragmatic hernia without obstruction or gangrene: Secondary | ICD-10-CM | POA: Insufficient documentation

## 2019-05-18 DIAGNOSIS — Z96641 Presence of right artificial hip joint: Secondary | ICD-10-CM | POA: Diagnosis not present

## 2019-05-18 DIAGNOSIS — Z79899 Other long term (current) drug therapy: Secondary | ICD-10-CM | POA: Insufficient documentation

## 2019-05-18 DIAGNOSIS — I10 Essential (primary) hypertension: Secondary | ICD-10-CM | POA: Insufficient documentation

## 2019-05-18 DIAGNOSIS — E669 Obesity, unspecified: Secondary | ICD-10-CM | POA: Insufficient documentation

## 2019-05-18 DIAGNOSIS — Y9389 Activity, other specified: Secondary | ICD-10-CM | POA: Diagnosis not present

## 2019-05-18 DIAGNOSIS — Y999 Unspecified external cause status: Secondary | ICD-10-CM | POA: Diagnosis not present

## 2019-05-18 DIAGNOSIS — Y929 Unspecified place or not applicable: Secondary | ICD-10-CM | POA: Insufficient documentation

## 2019-05-18 DIAGNOSIS — E119 Type 2 diabetes mellitus without complications: Secondary | ICD-10-CM | POA: Insufficient documentation

## 2019-05-18 DIAGNOSIS — S42202A Unspecified fracture of upper end of left humerus, initial encounter for closed fracture: Secondary | ICD-10-CM | POA: Diagnosis not present

## 2019-05-18 DIAGNOSIS — T148XXA Other injury of unspecified body region, initial encounter: Secondary | ICD-10-CM

## 2019-05-18 DIAGNOSIS — Z91040 Latex allergy status: Secondary | ICD-10-CM | POA: Diagnosis not present

## 2019-05-18 DIAGNOSIS — Z6836 Body mass index (BMI) 36.0-36.9, adult: Secondary | ICD-10-CM | POA: Diagnosis not present

## 2019-05-18 DIAGNOSIS — W19XXXA Unspecified fall, initial encounter: Secondary | ICD-10-CM

## 2019-05-18 DIAGNOSIS — S4992XA Unspecified injury of left shoulder and upper arm, initial encounter: Secondary | ICD-10-CM | POA: Diagnosis present

## 2019-05-18 MED ORDER — LIDOCAINE 5 % EX PTCH
1.0000 | MEDICATED_PATCH | CUTANEOUS | Status: DC
  Administered 2019-05-18: 1 via TRANSDERMAL
  Filled 2019-05-18: qty 1

## 2019-05-18 MED ORDER — FENTANYL CITRATE (PF) 100 MCG/2ML IJ SOLN
100.0000 ug | Freq: Once | INTRAMUSCULAR | Status: AC
  Administered 2019-05-18: 100 ug via INTRAVENOUS
  Filled 2019-05-18: qty 2

## 2019-05-18 MED ORDER — FENTANYL CITRATE (PF) 100 MCG/2ML IJ SOLN
100.0000 ug | Freq: Once | INTRAMUSCULAR | Status: AC
  Administered 2019-05-18: 16:00:00 100 ug via INTRAVENOUS
  Filled 2019-05-18 (×2): qty 2

## 2019-05-18 MED ORDER — HYDROMORPHONE HCL 1 MG/ML IJ SOLN
1.0000 mg | Freq: Once | INTRAMUSCULAR | Status: AC
  Administered 2019-05-18: 1 mg via INTRAVENOUS
  Filled 2019-05-18: qty 1

## 2019-05-18 MED ORDER — OXYCODONE HCL 5 MG PO TABS
5.0000 mg | ORAL_TABLET | Freq: Once | ORAL | Status: AC
  Administered 2019-05-18: 5 mg via ORAL
  Filled 2019-05-18: qty 1

## 2019-05-18 MED ORDER — BACLOFEN 10 MG PO TABS: 10.0000 mg | ORAL_TABLET | Freq: Three times a day (TID) | ORAL | 0 refills | Status: DC

## 2019-05-18 MED ORDER — DEXAMETHASONE SODIUM PHOSPHATE 10 MG/ML IJ SOLN
10.0000 mg | Freq: Once | INTRAMUSCULAR | Status: AC
  Administered 2019-05-18: 10 mg via INTRAVENOUS
  Filled 2019-05-18: qty 1

## 2019-05-18 MED ORDER — LIDOCAINE 5 % EX PTCH: 1.0000 | MEDICATED_PATCH | CUTANEOUS | 0 refills | Status: DC

## 2019-05-18 MED ORDER — ACETAMINOPHEN 500 MG PO TABS
1000.0000 mg | ORAL_TABLET | Freq: Once | ORAL | Status: DC
  Filled 2019-05-18: qty 2

## 2019-05-18 MED ORDER — OXYCODONE HCL 5 MG PO TABS: 5.0000 mg | ORAL_TABLET | ORAL | 0 refills | Status: DC | PRN

## 2019-05-18 MED ORDER — CYCLOBENZAPRINE HCL 10 MG PO TABS
10.0000 mg | ORAL_TABLET | Freq: Once | ORAL | Status: AC
  Administered 2019-05-18: 10 mg via ORAL
  Filled 2019-05-18: qty 1

## 2019-05-18 MED ORDER — KETOROLAC TROMETHAMINE 30 MG/ML IJ SOLN
30.0000 mg | Freq: Once | INTRAMUSCULAR | Status: AC
  Administered 2019-05-18: 30 mg via INTRAVENOUS
  Filled 2019-05-18: qty 1

## 2019-05-18 NOTE — ED Triage Notes (Signed)
Pt in from home via [facility] after reported slip fall in mud. No LOC or thinners. Pt c/o lumbar/thoracic pain and L humerus pain. No obvious deormities noted per EMS. CBG 374

## 2019-05-18 NOTE — ED Notes (Signed)
Ortho at bedside placing brace and sling

## 2019-05-18 NOTE — ED Notes (Signed)
Returned from  X-ray

## 2019-05-18 NOTE — ED Notes (Signed)
Attempted to walk patient, and pt. was unable to get out of bed, stated she could not stand and walk.

## 2019-05-18 NOTE — Progress Notes (Signed)
Orthopedic Tech Progress Note Patient Details:  Kelly Richardson May 11, 1951 [id number] Called in order to [facility] for a TLSO Patient ID: Kelly Richardson, female   DOB: 12/24/1950, 68 y.o.   MRN: [id number]   Kelly Richardson 05/18/2019, 7:49 PM

## 2019-05-18 NOTE — ED Notes (Signed)
Pt taken to xray 

## 2019-05-18 NOTE — ED Notes (Signed)
Patient pain 2/10 sore resting and with movement 10/10 sharp left shoulder and middle back when ortho attempted to place sling.

## 2019-05-18 NOTE — ED Notes (Signed)
Patient did not want pain medication at this time.

## 2019-05-18 NOTE — ED Notes (Signed)
Spoke with Ortho who will apply the sling immobilizer.

## 2019-05-18 NOTE — ED Provider Notes (Signed)
[facility] Provider Note   CSN: [id number] Arrival date & time: 05/18/19  1402     History   Chief Complaint Chief Complaint  Patient presents with  . Fall  . Back Pain  . Arm Pain    HPI Kelly Richardson is a 68 y.o. female.     HPI  68 year old female presents after a fall.  She was trying to get over a puddle to get to a car and slipped and fell.  Thinks she hit her arm on a gait.  She did not hit her head or lose consciousness.  No headache, neck pain.  No preceding symptoms.  The patient has severe pain in her arm as well as in her mid to low back.  No leg injuries.  No leg weakness or incontinence. Is not on a blood thinner.  Past Medical History:  Diagnosis Date  . Arthritis   . Complication of anesthesia   . Diabetes mellitus without complication (HCC)   . Heart murmur    as a child  . Hypertension   . PONV (postoperative nausea and vomiting)   . Seizures (HCC)   . Shortness of breath dyspnea    sometimes  at night    Patient Active Problem List   Diagnosis Date Noted  . Health care maintenance 04/09/2015  . H/O total hip arthroplasty 11/09/2014  . Osteomyelitis of right hip (HCC) 07/12/2014  . Chest pain 06/22/2014  . Anemia due to blood loss 06/20/2014  . Status post revision of total hip replacement 06/08/2014  . Anxiety state 03/05/2008  . FATIGUE 03/05/2008  . Uncontrolled insulin dependent diabetes mellitus 01/02/2008  . Hyperlipidemia 01/02/2008  . Essential hypertension 01/02/2008    Past Surgical History:  Procedure Laterality Date  . ABDOMINAL HYSTERECTOMY    . CHOLECYSTECTOMY  1995  . COLONOSCOPY    . EXCISIONAL TOTAL HIP ARTHROPLASTY WITH ANTIBIOTIC SPACERS Right 09/07/2014   Procedure: Excision Total Hip Arthroplasty, Place Antibiotic Spacer;  Surgeon: Nadara MustardMarcus Duda V, MD;  Location: [facility] OR;  Service: Orthopedics;  Laterality: Right;  . INCISION AND DRAINAGE HIP Right 06/08/2014   Procedure:  Irrigation and Debridement Right Hip, Revision of Cup and Head of Total Hip with  Placement  of Antibiotic Spacer;  Surgeon: Nadara MustardMarcus Duda V, MD;  Location: [facility] OR;  Service: Orthopedics;  Laterality: Right;  . TONSILLECTOMY    . TOTAL HIP ARTHROPLASTY Right 2011  . TOTAL HIP REVISION Right 11/09/2014   Procedure: TOTAL HIP REVISION;  Surgeon: Nadara MustardMarcus Duda V, MD;  Location: [facility] OR;  Service: Orthopedics;  Laterality: Right;     OB History   No obstetric history on file.      Home Medications    Prior to Admission medications   Medication Sig Start Date End Date Taking? Authorizing Provider  Insulin Detemir (LEVEMIR FLEXTOUCH) 100 UNIT/ML Pen Inject 40 Units into the skin at bedtime. 12/09/16   Pricilla LovelessGoldston, Biannca Scantlin, MD  lisinopril (PRINIVIL,ZESTRIL) 2.5 MG tablet Take 2.5 mg by mouth daily. 06/24/17   [provider]    Family History No family history on file.  Social History Social History   Tobacco Use  . Smoking status: Never Smoker  . Smokeless tobacco: Never Used  Substance Use Topics  . Alcohol use: No    Alcohol/week: 0.0 standard drinks  . Drug use: No     Allergies   Metformin, Penicillins, Seldane [terfenadine], Sulfonamide derivatives, Vicks formula 44 cough relief [dextromethorphan hbr], Xanax [alprazolam],  Iohexol, Latex, Other, and Percocet [oxycodone-acetaminophen]   Review of Systems Review of Systems  Cardiovascular: Negative for chest pain and palpitations.  Musculoskeletal: Positive for arthralgias and back pain. Negative for neck pain.  Neurological: Negative for weakness, numbness and headaches.  All other systems reviewed and are negative.    Physical Exam Updated Vital Signs BP 133/85   Pulse (!) 109   Temp 97.7 F (36.5 C) (Oral)   Resp 18   Wt 90.7 kg   SpO2 99%   BMI 36.57 kg/m   Physical Exam Vitals signs and nursing note reviewed.  Constitutional:      Appearance: She is well-developed. She is obese.  HENT:     Head:  Normocephalic and atraumatic.     Right Ear: External ear normal.     Left Ear: External ear normal.     Nose: Nose normal.  Eyes:     General:        Right eye: No discharge.        Left eye: No discharge.  Cardiovascular:     Rate and Rhythm: Normal rate and regular rhythm.     Pulses:          Radial pulses are 2+ on the left side.     Heart sounds: Normal heart sounds.  Pulmonary:     Effort: Pulmonary effort is normal.     Breath sounds: Normal breath sounds.  Abdominal:     General: There is no distension.     Palpations: Abdomen is soft.     Tenderness: There is no abdominal tenderness.  Musculoskeletal:     Left shoulder: She exhibits no tenderness and no deformity.     Left elbow: No tenderness found.     Right hip: She exhibits normal range of motion and no tenderness.     Left hip: She exhibits normal range of motion and no tenderness.     Thoracic back: She exhibits tenderness.     Lumbar back: She exhibits tenderness.     Left upper arm: She exhibits tenderness.     Left forearm: Normal. She exhibits no tenderness.     Comments: Is hard to localize but the patient has midline back tenderness in the inferior thoracic and superior lumbar region.  She will not move her left arm and holds it adducted to her body.  However the only area of pain seems to be in her left mid upper arm.  Normal strength/sensation in left hand.  Skin:    General: Skin is warm and dry.  Neurological:     Mental Status: She is alert.  Psychiatric:        Mood and Affect: Mood is not anxious.      ED Treatments / Results  Labs (all labs ordered are listed, but only abnormal results are displayed) Labs Reviewed - No data to display  EKG None  Radiology Dg Thoracic Spine W/swimmers  Result Date: 05/18/2019 CLINICAL DATA:  [place] with back pain. EXAM: THORACIC SPINE - 3 VIEWS COMPARISON:  Chest radiography 05/07/2018 FINDINGS: Minimal curvature convex to the left. Question minimal  compression fractures at superior T12 and possibly inferior T11. This is not definite. No advanced fracture. CT could be performed if desired. IMPRESSION: 1. Question minimal compression fractures at superior T12 and possibly inferior T11. This is not definite. CT could be performed if desired. Electronically Signed   By: Nelson Chimes M.D.   On: 05/18/2019 15:13   Dg Lumbar Spine  Complete  Result Date: 05/18/2019 CLINICAL DATA:  Larey Seat with low back pain. EXAM: LUMBAR SPINE - COMPLETE 4+ VIEW COMPARISON:  None. FINDINGS: Mild curvature convex to the right. No antero or retrolisthesis. No evidence of lower thoracic or lumbosacral fracture. No significant degenerative disc disease. Ordinary lower lumbar facet osteoarthritis. IMPRESSION: No acute or traumatic finding. Mild curvature convex to the right. Ordinary lower lumbar facet osteoarthritis. Electronically Signed   By: Paulina Fusi M.D.   On: 05/18/2019 15:10   Dg Humerus Left  Result Date: 05/18/2019 CLINICAL DATA:  Larey Seat with pain and deformity. EXAM: LEFT HUMERUS - 2+ VIEW COMPARISON:  None. FINDINGS: Acute fracture at the surgical neck of the humerus. Mild/minimal impaction and angulation. Humeral head is probably properly located. No other regional fracture. IMPRESSION: Acute fracture at the surgical neck of the humerus. Electronically Signed   By: Paulina Fusi M.D.   On: 05/18/2019 15:09    Procedures Procedures (including critical care time)  Medications Ordered in ED Medications  fentaNYL (SUBLIMAZE) injection 100 mcg (100 mcg Intravenous Given 05/18/19 1618)     Initial Impression / Assessment and Plan / ED Course  I have reviewed the triage vital signs and the nursing notes.  Pertinent labs & imaging results that were available during my care of the patient were reviewed by me and considered in my medical decision making (see chart for details).        Patient is neurovascular intact.  No signs or symptoms of acute spinal cord  emergency.  Discussed with Earney Hamburg for the proximal humerus fracture, likely nonop and can follow-up with Dr. Lajoyce Corners in 1-2 weeks.  As far as her thoracic spine, there is questionable fractures that I think will need CT imaging.  Care transferred to Dr. Dalene Seltzer.  Final Clinical Impressions(s) / ED Diagnoses   Final diagnoses:  Fall, initial encounter  Closed fracture of proximal end of left humerus, unspecified fracture morphology, initial encounter    ED Discharge Orders    None       Pricilla Loveless, MD 05/18/19 1629

## 2019-05-18 NOTE — ED Notes (Signed)
Doctor at bedside.

## 2019-05-18 NOTE — ED Provider Notes (Signed)
  Physical Exam  BP 110/72   Pulse 88   Temp 97.7 F (36.5 C) (Oral)   Resp 16   Wt 90.7 kg   SpO2 95%   BMI 36.57 kg/m   Physical Exam  ED Course/Procedures     Procedures  MDM   Received care of patient at 430 from Dr. Regenia Skeeter.  Please see his note for history, physical on prior exam.  Briefly this is a 68 year old female with a history of diabetes, hypertension, seizures, who presents with concern for mechanical fall.  She was found to have a left humerus fracture, for which orthopedics evaluated her, came up with a plan for outpatient management.  In addition, she has back pain with questionable thoracic spine fractures on x-ray.  CT is pending.  CT thoracic spine shows no evidence of acute thoracic spine fracture.  Patient continues to have severe pain, making her unable to sit up in bed.  She does have right-sided back rib tenderness on my exam, and does describe lower back pain, and will order additional CT chest to evaluate for occult rib fracture, as well as CT lumbar spine.   CT completed showing acute L2 inferior endplate compression fracture with minimal height loss and no retropulsion without signs of other acute abnormalities.  She does have good strength although somewhat limited by pain on my exam. Discussed with Dr. Ellene Route of [facility] given degree of pain. Will place in [facility] and have patient follow up as an outpatient.  She does have significant pain in back and left arm.  Discussed that typically with these types of injuries we recommend oral pain control and supportive care with specialist follow up.  She does have help at home and does not live alone.  Written rx for pain medication and discussed the risks. Reviewed in [place] drug database. Given rx for baclofen (recommended for her insurance), oxycodone, and lidocaine patch.  Given rx for walker, wheelchair, and home health/PT consult given her significant pain.  Given numbers for follow up with Dr. Sharol Given and Dr. Ellene Route and  recommend PCP follow up for continued pain control.       Gareth Morgan, MD 05/19/19 [phone]

## 2019-05-22 ENCOUNTER — Inpatient Hospital Stay (HOSPITAL_COMMUNITY)
Admission: EM | Admit: 2019-05-22 | Discharge: 2019-05-26 | DRG: 563 | Disposition: A | Discharge status: Skilled Nursing Facility | Payer: Medicare Other | Attending: Internal Medicine | Admitting: Internal Medicine

## 2019-05-22 ENCOUNTER — Other Ambulatory Visit: Payer: Self-pay

## 2019-05-22 DIAGNOSIS — Z96641 Presence of right artificial hip joint: Secondary | ICD-10-CM | POA: Diagnosis present

## 2019-05-22 DIAGNOSIS — I10 Essential (primary) hypertension: Secondary | ICD-10-CM

## 2019-05-22 DIAGNOSIS — E860 Dehydration: Secondary | ICD-10-CM

## 2019-05-22 DIAGNOSIS — S42202D Unspecified fracture of upper end of left humerus, subsequent encounter for fracture with routine healing: Secondary | ICD-10-CM

## 2019-05-22 DIAGNOSIS — Z888 Allergy status to other drugs, medicaments and biological substances status: Secondary | ICD-10-CM

## 2019-05-22 DIAGNOSIS — E1165 Type 2 diabetes mellitus with hyperglycemia: Secondary | ICD-10-CM | POA: Diagnosis present

## 2019-05-22 DIAGNOSIS — D72829 Elevated white blood cell count, unspecified: Secondary | ICD-10-CM

## 2019-05-22 DIAGNOSIS — S42309A Unspecified fracture of shaft of humerus, unspecified arm, initial encounter for closed fracture: Secondary | ICD-10-CM

## 2019-05-22 DIAGNOSIS — Z79899 Other long term (current) drug therapy: Secondary | ICD-10-CM

## 2019-05-22 DIAGNOSIS — S32009A Unspecified fracture of unspecified lumbar vertebra, initial encounter for closed fracture: Secondary | ICD-10-CM

## 2019-05-22 DIAGNOSIS — R739 Hyperglycemia, unspecified: Secondary | ICD-10-CM

## 2019-05-22 DIAGNOSIS — Z794 Long term (current) use of insulin: Secondary | ICD-10-CM

## 2019-05-22 DIAGNOSIS — D509 Iron deficiency anemia, unspecified: Secondary | ICD-10-CM | POA: Diagnosis present

## 2019-05-22 DIAGNOSIS — Z91040 Latex allergy status: Secondary | ICD-10-CM

## 2019-05-22 DIAGNOSIS — S42212A Unspecified displaced fracture of surgical neck of left humerus, initial encounter for closed fracture: Principal | ICD-10-CM | POA: Diagnosis present

## 2019-05-22 DIAGNOSIS — Z880 Allergy status to penicillin: Secondary | ICD-10-CM

## 2019-05-22 DIAGNOSIS — Z7982 Long term (current) use of aspirin: Secondary | ICD-10-CM

## 2019-05-22 DIAGNOSIS — S32028D Other fracture of second lumbar vertebra, subsequent encounter for fracture with routine healing: Secondary | ICD-10-CM

## 2019-05-22 DIAGNOSIS — E669 Obesity, unspecified: Secondary | ICD-10-CM | POA: Diagnosis present

## 2019-05-22 DIAGNOSIS — Y92009 Unspecified place in unspecified non-institutional (private) residence as the place of occurrence of the external cause: Secondary | ICD-10-CM

## 2019-05-22 DIAGNOSIS — Z90710 Acquired absence of both cervix and uterus: Secondary | ICD-10-CM

## 2019-05-22 DIAGNOSIS — M4856XA Collapsed vertebra, not elsewhere classified, lumbar region, initial encounter for fracture: Secondary | ICD-10-CM | POA: Diagnosis present

## 2019-05-22 DIAGNOSIS — Z6839 Body mass index (BMI) 39.0-39.9, adult: Secondary | ICD-10-CM

## 2019-05-22 DIAGNOSIS — W19XXXA Unspecified fall, initial encounter: Secondary | ICD-10-CM | POA: Diagnosis present

## 2019-05-22 DIAGNOSIS — W010XXA Fall on same level from slipping, tripping and stumbling without subsequent striking against object, initial encounter: Secondary | ICD-10-CM | POA: Diagnosis present

## 2019-05-22 DIAGNOSIS — M549 Dorsalgia, unspecified: Secondary | ICD-10-CM | POA: Diagnosis not present

## 2019-05-22 DIAGNOSIS — Z885 Allergy status to narcotic agent status: Secondary | ICD-10-CM

## 2019-05-22 DIAGNOSIS — Z9049 Acquired absence of other specified parts of digestive tract: Secondary | ICD-10-CM

## 2019-05-22 DIAGNOSIS — Z91041 Radiographic dye allergy status: Secondary | ICD-10-CM

## 2019-05-22 DIAGNOSIS — Z882 Allergy status to sulfonamides status: Secondary | ICD-10-CM

## 2019-05-22 DIAGNOSIS — E785 Hyperlipidemia, unspecified: Secondary | ICD-10-CM | POA: Diagnosis present

## 2019-05-22 DIAGNOSIS — Z20828 Contact with and (suspected) exposure to other viral communicable diseases: Secondary | ICD-10-CM | POA: Diagnosis present

## 2019-05-22 MED ORDER — OXYCODONE HCL 5 MG PO TABS
10.0000 mg | ORAL_TABLET | ORAL | Status: DC | PRN
  Administered 2019-05-23: 10 mg via ORAL
  Filled 2019-05-22 (×2): qty 2

## 2019-05-22 MED ORDER — SODIUM CHLORIDE 0.9 % IV BOLUS
1000.0000 mL | Freq: Once | INTRAVENOUS | Status: AC
  Administered 2019-05-23: 01:00:00 1000 mL via INTRAVENOUS

## 2019-05-22 NOTE — ED Notes (Signed)
Pt has been laying in her urine and feces for days. Performed full body sponge bath and pericare. Placed patient on purwick. Pt states she lives with her boyfriend but had no help to move her and she was unable to move around due to severe pain.

## 2019-05-22 NOTE — ED Provider Notes (Signed)
[facility]-EMERGENCY DEPT Provider Note   CSN: [id number] Arrival date & time: 05/22/19  2021     History   Chief Complaint Chief Complaint  Patient presents with   Back Pain   Arm Pain    HPI Kelly Richardson is a 68 y.o. female with a pertinent pmh of DM, HTN, seizures, recently discharged from [facility] ED on 11/12 for left humerus fracture at the surgical neck and L2 endplate spine fracture, and presents to [facility] today after being found at home laying in her feces and urine for days. She was unable to take care of her self 2/2 to pain from recent fractures. Patient was discharged with the intention of close outpatient follow up and assistance from her boyfriend who she lives with. Patient denies recent falls since being discharged. She states that she has severe pain in her back and left arm if she moves at all. She was prescribed oxycodone with mild relief of her symptoms. Patient states that she has had little appetite since being discharged, eating only a few bite of food for each meal of the day. She was brought back to the ED because her pain was not well controlled and she was unable to take care of her self.      HPI  Past Medical History:  Diagnosis Date   Arthritis    Complication of anesthesia    Diabetes mellitus without complication (HCC)    Heart murmur    as a child   Hypertension    PONV (postoperative nausea and vomiting)    Seizures (HCC)    Shortness of breath dyspnea    sometimes  at night    Patient Active Problem List   Diagnosis Date Noted   Health care maintenance 04/09/2015   H/O total hip arthroplasty 11/09/2014   Osteomyelitis of right hip (HCC) 07/12/2014   Chest pain 06/22/2014   Anemia due to blood loss 06/20/2014   Status post revision of total hip replacement 06/08/2014   Anxiety state 03/05/2008   FATIGUE 03/05/2008   Uncontrolled insulin dependent diabetes mellitus 01/02/2008   Hyperlipidemia  01/02/2008   Essential hypertension 01/02/2008    Past Surgical History:  Procedure Laterality Date   ABDOMINAL HYSTERECTOMY     CHOLECYSTECTOMY  1995   COLONOSCOPY     EXCISIONAL TOTAL HIP ARTHROPLASTY WITH ANTIBIOTIC SPACERS Right 09/07/2014   Procedure: Excision Total Hip Arthroplasty, Place Antibiotic Spacer;  Surgeon: Nadara Mustard, MD;  Location: [facility] OR;  Service: Orthopedics;  Laterality: Right;   INCISION AND DRAINAGE HIP Right 06/08/2014   Procedure: Irrigation and Debridement Right Hip, Revision of Cup and Head of Total Hip with  Placement  of Antibiotic Spacer;  Surgeon: Nadara Mustard, MD;  Location: [facility] OR;  Service: Orthopedics;  Laterality: Right;   TONSILLECTOMY     TOTAL HIP ARTHROPLASTY Right 2011   TOTAL HIP REVISION Right 11/09/2014   Procedure: TOTAL HIP REVISION;  Surgeon: Nadara Mustard, MD;  Location: [facility] OR;  Service: Orthopedics;  Laterality: Right;     OB History   No obstetric history on file.      Home Medications    Prior to Admission medications   Medication Sig Start Date End Date Taking? Authorizing Provider  ACCU-CHEK AVIVA PLUS test strip 1 each by Other route daily. 05/02/19   [provider]  aspirin 81 MG chewable tablet Chew 81 mg by mouth daily.    [provider]  atorvastatin (LIPITOR) 20  MG tablet Take 20 mg by mouth daily. 03/11/19   [provider]  baclofen (LIORESAL) 10 MG tablet Take 1 tablet (10 mg total) by mouth 3 (three) times daily. 05/18/19   Alvira MondaySchlossman, Erin, MD  insulin aspart (NOVOLOG FLEXPEN) 100 UNIT/ML FlexPen Inject 15 Units as directed daily. Per sliding scale and not to exceed 81 units daily. 04/05/19   [provider]  insulin aspart (NOVOLOG) 100 UNIT/ML injection See admin instructions. Inject 4 units at meal if blood sugar is over 225. If over 300, inject 6 units. 08/08/18   [provider]  Insulin Detemir (LEVEMIR FLEXTOUCH) 100 UNIT/ML Pen Inject 40 Units into the skin at  bedtime. 12/09/16   Pricilla LovelessGoldston, Scott, MD  Insulin Pen Needle (FIFTY50 PEN NEEDLES) 31G X 8 MM MISC 1 each by Other route daily. 02/15/18   [provider]  ketoconazole (NIZORAL) 2 % shampoo Apply 1 application topically continuous as needed. 04/17/19   [provider]  lidocaine (LIDODERM) 5 % Place 1 patch onto the skin daily. Remove & Discard patch within 12 hours or as directed by MD 05/18/19   Alvira MondaySchlossman, Erin, MD  lisinopril (PRINIVIL,ZESTRIL) 2.5 MG tablet Take 2.5 mg by mouth daily. 06/24/17   [provider]  oxyCODONE (ROXICODONE) 5 MG immediate release tablet Take 1-2 tablets (5-10 mg total) by mouth every 4 (four) hours as needed for severe pain. 05/18/19   Alvira MondaySchlossman, Erin, MD    Family History No family history on file.  Social History Social History   Tobacco Use   Smoking status: Never Smoker   Smokeless tobacco: Never Used  Substance Use Topics   Alcohol use: No    Alcohol/week: 0.0 standard drinks   Drug use: No     Allergies   Metformin, Penicillins, Seldane [terfenadine], Sulfonamide derivatives, Vicks formula 44 cough relief [dextromethorphan hbr], Xanax [alprazolam], Iohexol, Latex, Other, and Percocet [oxycodone-acetaminophen]   Review of Systems Review of Systems  Constitutional: Negative for chills, fatigue and fever.  Respiratory: Negative for chest tightness and shortness of breath.   Gastrointestinal: Negative for abdominal distention, abdominal pain, diarrhea, nausea and vomiting.  Genitourinary: Negative for difficulty urinating and dysuria.  Musculoskeletal: Positive for back pain.       Left arm pain     Physical Exam Updated Vital Signs There were no vitals taken for this visit.  Physical Exam Constitutional:      Appearance: Normal appearance.  HENT:     Head: Normocephalic and atraumatic.  Eyes:     Extraocular Movements: Extraocular movements intact.     Pupils: Pupils are equal, round, and reactive to  light.  Neck:     Musculoskeletal: Normal range of motion.  Cardiovascular:     Rate and Rhythm: Normal rate and regular rhythm.     Pulses: Normal pulses.     Heart sounds: Normal heart sounds. No murmur. No friction rub. No gallop.   Pulmonary:     Effort: Pulmonary effort is normal. No respiratory distress.     Breath sounds: Normal breath sounds.  Abdominal:     General: There is no distension.     Tenderness: There is no abdominal tenderness.  Musculoskeletal:        General: Tenderness (thoracic area and left arm ) present. No swelling.  Skin:    General: Skin is warm and dry.  Neurological:     General: No focal deficit present.     Mental Status: She is alert and oriented to person,  place, and time.      ED Treatments / Results  Labs (all labs ordered are listed, but only abnormal results are displayed) Labs Reviewed - No data to display  EKG None  Radiology No results found.  Procedures Procedures (including critical care time)  Medications Ordered in ED Medications - No data to display   Initial Impression / Assessment and Plan / ED Course  I have reviewed the triage vital signs and the nursing notes.  Pertinent labs & imaging results that were available during my care of the patient were reviewed by me and considered in my medical decision making (see chart for details).   Ms. Robart is a 68 y.o. female who presents to [facility] for further evaluation and management of L2 fracture and left humerus fracture.  1. Left Humerus Fracture at the surgical neck and L2 spine endplate fracture. - Patient will likely need to be admitted for pain management  - Will ultimately need to be discharged to nursing facility for additional support.        Final Clinical Impressions(s) / ED Diagnoses   Final diagnoses:  None    ED Discharge Orders    None       Marianna Payment, MD 05/23/19 Berniece Salines    Gareth Morgan, MD 05/24/19 2244

## 2019-05-22 NOTE — ED Triage Notes (Signed)
68 yo female from home BIB [facility]. Pt fell last Friday and broke her arm in two places and has been in a back brace since the fall. She was evaluated at [facility]. Pt called [facility] due to nausea and vomiting as well increase pain in her back. Pt is non ambulatory as per EMS. EMS states when they arrived to the scene pt was laying in her urine and feces. EMS states it appears she has been this way for days. PT states she has been laying in the same spot since last Thursday and has not been able to move. Pt is AOx4   Vitals: bp 108/82 Hr 118 spo2 97 on room air cbg 252 Temp 98.1

## 2019-05-23 ENCOUNTER — Observation Stay (HOSPITAL_COMMUNITY): Payer: Medicare Other

## 2019-05-23 ENCOUNTER — Other Ambulatory Visit: Payer: Self-pay

## 2019-05-23 DIAGNOSIS — R739 Hyperglycemia, unspecified: Secondary | ICD-10-CM

## 2019-05-23 DIAGNOSIS — E1165 Type 2 diabetes mellitus with hyperglycemia: Secondary | ICD-10-CM | POA: Diagnosis present

## 2019-05-23 DIAGNOSIS — Z90710 Acquired absence of both cervix and uterus: Secondary | ICD-10-CM | POA: Diagnosis not present

## 2019-05-23 DIAGNOSIS — Z794 Long term (current) use of insulin: Secondary | ICD-10-CM | POA: Diagnosis not present

## 2019-05-23 DIAGNOSIS — Z91040 Latex allergy status: Secondary | ICD-10-CM | POA: Diagnosis not present

## 2019-05-23 DIAGNOSIS — I10 Essential (primary) hypertension: Secondary | ICD-10-CM | POA: Diagnosis present

## 2019-05-23 DIAGNOSIS — S32009A Unspecified fracture of unspecified lumbar vertebra, initial encounter for closed fracture: Secondary | ICD-10-CM

## 2019-05-23 DIAGNOSIS — S42202A Unspecified fracture of upper end of left humerus, initial encounter for closed fracture: Secondary | ICD-10-CM

## 2019-05-23 DIAGNOSIS — E785 Hyperlipidemia, unspecified: Secondary | ICD-10-CM | POA: Diagnosis present

## 2019-05-23 DIAGNOSIS — M4856XA Collapsed vertebra, not elsewhere classified, lumbar region, initial encounter for fracture: Secondary | ICD-10-CM | POA: Diagnosis present

## 2019-05-23 DIAGNOSIS — S42309A Unspecified fracture of shaft of humerus, unspecified arm, initial encounter for closed fracture: Secondary | ICD-10-CM

## 2019-05-23 DIAGNOSIS — W010XXA Fall on same level from slipping, tripping and stumbling without subsequent striking against object, initial encounter: Secondary | ICD-10-CM | POA: Diagnosis present

## 2019-05-23 DIAGNOSIS — Z79899 Other long term (current) drug therapy: Secondary | ICD-10-CM | POA: Diagnosis not present

## 2019-05-23 DIAGNOSIS — D72829 Elevated white blood cell count, unspecified: Secondary | ICD-10-CM

## 2019-05-23 DIAGNOSIS — Z880 Allergy status to penicillin: Secondary | ICD-10-CM | POA: Diagnosis not present

## 2019-05-23 DIAGNOSIS — Z7982 Long term (current) use of aspirin: Secondary | ICD-10-CM | POA: Diagnosis not present

## 2019-05-23 DIAGNOSIS — Y92009 Unspecified place in unspecified non-institutional (private) residence as the place of occurrence of the external cause: Secondary | ICD-10-CM | POA: Diagnosis not present

## 2019-05-23 DIAGNOSIS — D509 Iron deficiency anemia, unspecified: Secondary | ICD-10-CM | POA: Diagnosis present

## 2019-05-23 DIAGNOSIS — Z6839 Body mass index (BMI) 39.0-39.9, adult: Secondary | ICD-10-CM | POA: Diagnosis not present

## 2019-05-23 DIAGNOSIS — E669 Obesity, unspecified: Secondary | ICD-10-CM | POA: Diagnosis present

## 2019-05-23 DIAGNOSIS — Z96641 Presence of right artificial hip joint: Secondary | ICD-10-CM | POA: Diagnosis present

## 2019-05-23 DIAGNOSIS — Z885 Allergy status to narcotic agent status: Secondary | ICD-10-CM | POA: Diagnosis not present

## 2019-05-23 DIAGNOSIS — M549 Dorsalgia, unspecified: Secondary | ICD-10-CM | POA: Diagnosis present

## 2019-05-23 DIAGNOSIS — W19XXXA Unspecified fall, initial encounter: Secondary | ICD-10-CM | POA: Diagnosis present

## 2019-05-23 DIAGNOSIS — Z882 Allergy status to sulfonamides status: Secondary | ICD-10-CM | POA: Diagnosis not present

## 2019-05-23 DIAGNOSIS — Z91041 Radiographic dye allergy status: Secondary | ICD-10-CM | POA: Diagnosis not present

## 2019-05-23 DIAGNOSIS — Z9049 Acquired absence of other specified parts of digestive tract: Secondary | ICD-10-CM | POA: Diagnosis not present

## 2019-05-23 DIAGNOSIS — Z20828 Contact with and (suspected) exposure to other viral communicable diseases: Secondary | ICD-10-CM | POA: Diagnosis present

## 2019-05-23 DIAGNOSIS — Z888 Allergy status to other drugs, medicaments and biological substances status: Secondary | ICD-10-CM | POA: Diagnosis not present

## 2019-05-23 DIAGNOSIS — S42212A Unspecified displaced fracture of surgical neck of left humerus, initial encounter for closed fracture: Secondary | ICD-10-CM | POA: Diagnosis present

## 2019-05-23 LAB — COMPREHENSIVE METABOLIC PANEL
ALT: 25 U/L (ref 0–44)
AST: 21 U/L (ref 15–41)
Albumin: 3.8 g/dL (ref 3.5–5.0)
Alkaline Phosphatase: 92 U/L (ref 38–126)
Anion gap: 13 (ref 5–15)
BUN: 17 mg/dL (ref 8–23)
CO2: 21 mmol/L — ABNORMAL LOW (ref 22–32)
Calcium: 9.4 mg/dL (ref 8.9–10.3)
Chloride: 101 mmol/L (ref 98–111)
Creatinine, Ser: 0.86 mg/dL (ref 0.44–1.00)
GFR calc Af Amer: >60 mL/min (ref >60)
GFR calc non Af Amer: >60 mL/min (ref >60)
Glucose, Bld: 282 mg/dL — ABNORMAL HIGH (ref 70–99)
Potassium: 4.6 mmol/L (ref 3.5–5.1)
Sodium: 135 mmol/L (ref 135–145)
Total Bilirubin: 1.1 mg/dL (ref 0.3–1.2)
Total Protein: 8.3 g/dL — ABNORMAL HIGH (ref 6.5–8.1)

## 2019-05-23 LAB — GLUCOSE, CAPILLARY
Glucose-Capillary: 226 mg/dL — ABNORMAL HIGH (ref 70–99)
Glucose-Capillary: 200 mg/dL — ABNORMAL HIGH (ref 70–99)
Glucose-Capillary: 204 mg/dL — ABNORMAL HIGH (ref 70–99)
Glucose-Capillary: 224 mg/dL — ABNORMAL HIGH (ref 70–99)

## 2019-05-23 LAB — URINALYSIS, ROUTINE W REFLEX MICROSCOPIC
Bilirubin Urine: NEGATIVE
Glucose, UA: >=500 mg/dL — AB
Hgb urine dipstick: NEGATIVE
Ketones, ur: 80 mg/dL — AB
Leukocytes,Ua: NEGATIVE
Nitrite: NEGATIVE
Protein, ur: NEGATIVE mg/dL
Specific Gravity, Urine: 1.029 (ref 1.005–1.030)
pH: 5 (ref 5.0–8.0)

## 2019-05-23 LAB — HEMOGLOBIN A1C
Hgb A1c MFr Bld: 10.6 % — ABNORMAL HIGH (ref 4.8–5.6)
Mean Plasma Glucose: 257.52 mg/dL

## 2019-05-23 LAB — CBC
HCT: 44.1 % (ref 36.0–46.0)
HCT: 43.8 % (ref 36.0–46.0)
Hemoglobin: 12.3 g/dL (ref 12.0–15.0)
Hemoglobin: 11.8 g/dL — ABNORMAL LOW (ref 12.0–15.0)
MCH: 19.8 pg — ABNORMAL LOW (ref 26.0–34.0)
MCH: 19.8 pg — ABNORMAL LOW (ref 26.0–34.0)
MCHC: 27.9 g/dL — ABNORMAL LOW (ref 30.0–36.0)
MCHC: 26.9 g/dL — ABNORMAL LOW (ref 30.0–36.0)
MCV: 71 fL — ABNORMAL LOW (ref 80.0–100.0)
MCV: 73.5 fL — ABNORMAL LOW (ref 80.0–100.0)
Platelets: 363 10*3/uL (ref 150–400)
Platelets: 364 10*3/uL (ref 150–400)
RBC: 6.21 MIL/uL — ABNORMAL HIGH (ref 3.87–5.11)
RBC: 5.96 MIL/uL — ABNORMAL HIGH (ref 3.87–5.11)
RDW: 19.7 % — ABNORMAL HIGH (ref 11.5–15.5)
RDW: 19.4 % — ABNORMAL HIGH (ref 11.5–15.5)
WBC: 16 10*3/uL — ABNORMAL HIGH (ref 4.0–10.5)
WBC: 16.6 10*3/uL — ABNORMAL HIGH (ref 4.0–10.5)
nRBC: 0 % (ref 0.0–0.2)
nRBC: 0 % (ref 0.0–0.2)

## 2019-05-23 LAB — BASIC METABOLIC PANEL
Anion gap: 13 (ref 5–15)
BUN: 17 mg/dL (ref 8–23)
CO2: 18 mmol/L — ABNORMAL LOW (ref 22–32)
Calcium: 8.9 mg/dL (ref 8.9–10.3)
Chloride: 104 mmol/L (ref 98–111)
Creatinine, Ser: 0.84 mg/dL (ref 0.44–1.00)
GFR calc Af Amer: >60 mL/min (ref >60)
GFR calc non Af Amer: >60 mL/min (ref >60)
Glucose, Bld: 282 mg/dL — ABNORMAL HIGH (ref 70–99)
Potassium: 4.3 mmol/L (ref 3.5–5.1)
Sodium: 135 mmol/L (ref 135–145)

## 2019-05-23 LAB — BETA-HYDROXYBUTYRIC ACID: Beta-Hydroxybutyric Acid: 1.72 mmol/L — ABNORMAL HIGH (ref 0.05–0.27)

## 2019-05-23 LAB — HIV ANTIBODY (ROUTINE TESTING W REFLEX): HIV Screen 4th Generation wRfx: NONREACTIVE — AB

## 2019-05-23 LAB — SARS CORONAVIRUS 2 (TAT 6-24 HRS): SARS Coronavirus 2: NEGATIVE

## 2019-05-23 MED ORDER — HEPARIN SODIUM (PORCINE) 5000 UNIT/ML IJ SOLN
5000.0000 [IU] | Freq: Three times a day (TID) | INTRAMUSCULAR | Status: DC
  Administered 2019-05-23 – 2019-05-26 (×8): 5000 [IU] via SUBCUTANEOUS
  Filled 2019-05-23 (×9): qty 1

## 2019-05-23 MED ORDER — SODIUM CHLORIDE 0.9 % IV SOLN
INTRAVENOUS | Status: DC
  Administered 2019-05-23 (×2): via INTRAVENOUS

## 2019-05-23 MED ORDER — BACLOFEN 10 MG PO TABS
10.0000 mg | ORAL_TABLET | Freq: Three times a day (TID) | ORAL | Status: DC
  Administered 2019-05-23 (×3): 10 mg via ORAL
  Filled 2019-05-23 (×3): qty 1

## 2019-05-23 MED ORDER — KETOROLAC TROMETHAMINE 15 MG/ML IJ SOLN
15.0000 mg | Freq: Four times a day (QID) | INTRAMUSCULAR | Status: DC | PRN
  Administered 2019-05-23 – 2019-05-25 (×7): 15 mg via INTRAVENOUS
  Filled 2019-05-23 (×8): qty 1

## 2019-05-23 MED ORDER — ACETAMINOPHEN 650 MG RE SUPP: 650.0000 mg | Freq: Four times a day (QID) | RECTAL | Status: DC | PRN

## 2019-05-23 MED ORDER — INSULIN ASPART 100 UNIT/ML ~~LOC~~ SOLN
0.0000 [IU] | Freq: Three times a day (TID) | SUBCUTANEOUS | Status: DC
  Administered 2019-05-23 (×2): 3 [IU] via SUBCUTANEOUS
  Administered 2019-05-23: 2 [IU] via SUBCUTANEOUS
  Administered 2019-05-24: 1 [IU] via SUBCUTANEOUS
  Administered 2019-05-24 – 2019-05-25 (×2): 2 [IU] via SUBCUTANEOUS
  Administered 2019-05-25: 3 [IU] via SUBCUTANEOUS
  Administered 2019-05-25: 2 [IU] via SUBCUTANEOUS
  Administered 2019-05-26: 1 [IU] via SUBCUTANEOUS
  Administered 2019-05-26: 14:00:00 2 [IU] via SUBCUTANEOUS
  Filled 2019-05-23: qty 0.09

## 2019-05-23 MED ORDER — ATORVASTATIN CALCIUM 20 MG PO TABS
20.0000 mg | ORAL_TABLET | Freq: Every evening | ORAL | Status: DC
  Administered 2019-05-23 – 2019-05-25 (×3): 20 mg via ORAL
  Filled 2019-05-23 (×3): qty 1

## 2019-05-23 MED ORDER — INSULIN DETEMIR 100 UNIT/ML ~~LOC~~ SOLN
50.0000 [IU] | Freq: Every day | SUBCUTANEOUS | Status: DC
  Administered 2019-05-23: 22:00:00 50 [IU] via SUBCUTANEOUS
  Filled 2019-05-23 (×2): qty 0.5

## 2019-05-23 MED ORDER — ONDANSETRON HCL 4 MG/2ML IJ SOLN
4.0000 mg | Freq: Three times a day (TID) | INTRAMUSCULAR | Status: DC | PRN
  Administered 2019-05-23: 11:00:00 4 mg via INTRAVENOUS
  Filled 2019-05-23: qty 2

## 2019-05-23 MED ORDER — DIPHENHYDRAMINE HCL 25 MG PO CAPS
25.0000 mg | ORAL_CAPSULE | ORAL | Status: DC | PRN
  Administered 2019-05-23: 22:00:00 25 mg via ORAL
  Filled 2019-05-23: qty 1

## 2019-05-23 MED ORDER — ACETAMINOPHEN 325 MG PO TABS
650.0000 mg | ORAL_TABLET | Freq: Four times a day (QID) | ORAL | Status: DC | PRN
  Administered 2019-05-25: 650 mg via ORAL
  Filled 2019-05-23 (×2): qty 2

## 2019-05-23 MED ORDER — LISINOPRIL 5 MG PO TABS
2.5000 mg | ORAL_TABLET | Freq: Every evening | ORAL | Status: DC
  Administered 2019-05-23 – 2019-05-25 (×3): 2.5 mg via ORAL
  Filled 2019-05-23 (×3): qty 1

## 2019-05-23 MED ORDER — ASPIRIN 81 MG PO CHEW
81.0000 mg | CHEWABLE_TABLET | Freq: Every evening | ORAL | Status: DC
  Administered 2019-05-23 – 2019-05-25 (×3): 81 mg via ORAL
  Filled 2019-05-23 (×3): qty 1

## 2019-05-23 MED ORDER — INSULIN ASPART 100 UNIT/ML ~~LOC~~ SOLN
0.0000 [IU] | Freq: Every day | SUBCUTANEOUS | Status: DC
  Administered 2019-05-23: 2 [IU] via SUBCUTANEOUS
  Administered 2019-05-24 – 2019-05-25 (×2): 3 [IU] via SUBCUTANEOUS
  Filled 2019-05-23: qty 0.05

## 2019-05-23 NOTE — Progress Notes (Signed)
OT Cancellation Note  Patient Details Name: OLETA GUNNOE MRN: [id number] DOB: Feb 28, 1951   Cancelled Treatment:    Reason Eval/Treat Not Completed: Medical issues which prohibited therapy   Noted PT note.  Also need clarification for any ROM indicated for RUE.  Kari Baars, OT Acute Rehabilitation Services [phone] Office- [phone], Edwena Felty D 05/23/2019, 1:49 PM

## 2019-05-23 NOTE — Progress Notes (Signed)
Inpatient Diabetes Program Recommendations  AACE/ADA: New Consensus Statement on Inpatient Glycemic Control (2015)  Target Ranges:  Prepandial:   less than 140 mg/dL      Peak postprandial:   less than 180 mg/dL (1-2 hours)      Critically ill patients:  140 - 180 mg/dL   Lab Results  Component Value Date   GLUCAP 200 (H) 05/23/2019   HGBA1C 10.6 (H) 05/23/2019    Review of Glycemic Control  Diabetes history: DM2 Outpatient Diabetes medications: Levemir 50 units QHS, Novolog 15 units tidwc (not to exceed 81 units) Current orders for Inpatient glycemic control: Levemir 50 units QHS, Novolog 0-9 units tidwc and 0-5 units QHS  HgbA1C - 10.6%  Inpatient Diabetes Program Recommendations:     Add Novolog 6 units tidwc for meal coverage insulin if pt eats . 50% meal  Will discuss HgbA1C results in am.   Thank you. Lorenda Peck, RD, LDN, CDE Inpatient Diabetes Coordinator [phone]

## 2019-05-23 NOTE — ED Notes (Signed)
ED TO INPATIENT HANDOFF REPORT  Name/Age/Gender Kelly Richardson 68 y.o. female  Code Status Code Status History    Date Active Date Inactive Code Status Order ID Comments User Context   11/09/2014 2101 11/13/2014 1728 Full Code [id number]  Nadara Mustard, MD Inpatient   09/07/2014 1816 09/11/2014 1658 Full Code [id number]  Nadara Mustard, MD Inpatient   06/08/2014 2114 06/12/2014 1916 Full Code [id number]  Nadara Mustard, MD Inpatient   Advance Care Planning Activity      Home/SNF/Other Home  Chief Complaint Back Pain and Nausea   Level of Care/Admitting Diagnosis ED Disposition    ED Disposition Condition Comment   Admit  Hospital Area: [facility] [[id number]]  Level of Care: Telemetry [5]  Admit to tele based on following criteria: Complex arrhythmia (Bradycardia/Tachycardia)  Covid Evaluation: Asymptomatic Screening Protocol (No Symptoms)  Diagnosis: Lower back pain [200305]  Admitting Physician: John Giovanni [[id number]]  Attending Physician: John Giovanni [[id number]]  PT Class (Do Not Modify): Observation [104]  PT Acc Code (Do Not Modify): Observation [10022]       Medical History Past Medical History:  Diagnosis Date  . Arthritis   . Complication of anesthesia   . Diabetes mellitus without complication (HCC)   . Heart murmur    as a child  . Hypertension   . PONV (postoperative nausea and vomiting)   . Seizures (HCC)   . Shortness of breath dyspnea    sometimes  at night    Allergies Allergies  Allergen Reactions  . Metformin Other (See Comments)    REACTION: faintness, DIZZINESS  . Penicillins Anaphylaxis  . Seldane [Terfenadine] Other (See Comments)    N/V, increased heart rate, seizures, hallinations.  . Sulfonamide Derivatives Anaphylaxis  . Vicks Formula 44 Cough Relief [Dextromethorphan Hbr] Other (See Comments)    Runs blood pressure up   . Xanax [Alprazolam] Other (See Comments)    Pt does not want Xanax--history of  addiction.  . Iohexol Other (See Comments)     Desc: pt states [place] and immediately experienced profuse vomiting and sweating.  She said they treated her like she was having a reaction and doesn't remember much else or if meds were given., Onset Date: [id number]   . Latex Other (See Comments)    BLISTERING  . Other Nausea And Vomiting    "Anything I have ever been given more nausea just increases it."  . Percocet [Oxycodone-Acetaminophen] Other (See Comments)    Hallucinations    IV Location/Drains/Wounds Patient Lines/Drains/Airways Status   Active Line/Drains/Airways    Name:   Placement date:   Placement time:   Site:   Days:   Peripheral IV 07/09/17   07/09/17    1501    -   683   Peripheral IV 05/18/19 Right Hand   05/18/19    1500    Hand   5   Peripheral IV 05/23/19 Right Antecubital   05/23/19    0039    Antecubital   less than 1          Labs/Imaging Results for orders placed or performed during the hospital encounter of 05/22/19 (from the past 48 hour(s))  CBC     Status: Abnormal   Collection Time: 05/22/19 11:25 PM  Result Value Ref Range   WBC 16.0 (H) 4.0 - 10.5 K/uL   RBC 6.21 (H) 3.87 - 5.11 MIL/uL   Hemoglobin 12.3 12.0 - 15.0 g/dL  HCT 44.1 36.0 - 46.0 %   MCV 71.0 (L) 80.0 - 100.0 fL   MCH 19.8 (L) 26.0 - 34.0 pg   MCHC 27.9 (L) 30.0 - 36.0 g/dL   RDW 19.7 (H) 11.5 - 15.5 %   Platelets 363 150 - 400 K/uL   nRBC 0.0 0.0 - 0.2 %    Comment: Performed at [facility], [place] [address], [place], [place] [address]  Comprehensive metabolic panel     Status: Abnormal   Collection Time: 05/22/19 11:25 PM  Result Value Ref Range   Sodium 135 135 - 145 mmol/L   Potassium 4.6 3.5 - 5.1 mmol/L   Chloride 101 98 - 111 mmol/L   CO2 21 (L) 22 - 32 mmol/L   Glucose, Bld 282 (H) 70 - 99 mg/dL   BUN 17 8 - 23 mg/dL   Creatinine, Ser 0.86 0.44 - 1.00 mg/dL   Calcium 9.4 8.9 - 10.3 mg/dL   Total Protein 8.3 (H) 6.5 - 8.1 g/dL    Albumin 3.8 3.5 - 5.0 g/dL   AST 21 15 - 41 U/L   ALT 25 0 - 44 U/L   Alkaline Phosphatase 92 38 - 126 U/L   Total Bilirubin 1.1 0.3 - 1.2 mg/dL   GFR calc non Af Amer >60 >60 mL/min   GFR calc Af Amer >60 >60 mL/min   Anion gap 13 5 - 15    Comment: Performed at [facility], [place] [address] [place], [place] [address]   No results found.  Pending Labs Unresulted Labs (From admission, onward)    Start     Ordered   05/23/19 0236  Urinalysis, Routine w reflex microscopic  ONCE - STAT,   STAT     05/23/19 0235   05/22/19 2320  SARS CORONAVIRUS 2 (TAT 6-24 HRS) Nasopharyngeal Nasopharyngeal Swab  (Asymptomatic/Tier 2 Patients Labs)  Once,   STAT    Question Answer Comment  Is this test for diagnosis or screening Screening   Symptomatic for COVID-19 as defined by CDC No   Hospitalized for COVID-19 No   Admitted to ICU for COVID-19 No   Previously tested for COVID-19 No   Resident in a congregate (group) care setting No   Employed in healthcare setting No   Pregnant No      05/22/19 2325          Vitals/Pain Today's Vitals   05/22/19 2144 05/23/19 0000 05/23/19 0006 05/23/19 0207  BP:  108/69 108/69   Pulse:  (!) 114 (!) 107   Resp:  18 17   Temp:   98.5 F (36.9 C)   TempSrc:   Oral   SpO2:  96% 96%   PainSc: 10-Worst pain ever   4     Isolation Precautions No active isolations  Medications Medications  oxyCODONE (Oxy IR/ROXICODONE) immediate release tablet 10 mg (10 mg Oral Given 05/23/19 0041)  0.9 %  sodium chloride infusion (has no administration in time range)  sodium chloride 0.9 % bolus 1,000 mL (1,000 mLs Intravenous New Bag/Given 05/23/19 0039)    Mobility

## 2019-05-23 NOTE — Progress Notes (Signed)
Pt with dark red emesis that occurred when physical therapy attempted to work with patient. MD paged, orders received.

## 2019-05-23 NOTE — Progress Notes (Signed)
Zofran given d/t patient with emesis and nausea.

## 2019-05-23 NOTE — TOC Initial Note (Signed)
Transition of Care [facility]) - Initial/Assessment Note    Patient Details  Name: JERZEY KOMPERDA MRN: [id number] Date of Birth: 12-12-1950  Transition of Care [facility]) CM/SW Contact:    Lanier Clam, RN Phone Number: 05/23/2019, 11:44 AM  Clinical Narrative: Await PT eval & recc.                  Expected Discharge Plan: Home w Home Health Services Barriers to Discharge: Continued Medical Work up   Patient Goals and CMS Choice        Expected Discharge Plan and Services Expected Discharge Plan: Home w Home Health Services   Discharge Planning Services: CM Consult   Living arrangements for the past 2 months: Single Family Home                                      Prior Living Arrangements/Services Living arrangements for the past 2 months: Single Family Home Lives with:: Significant Other Patient language and need for interpreter reviewed:: Yes Do you feel safe going back to the place where you live?: Yes      Need for Family Participation in Patient Care: No (Comment) Care giver support system in place?: Yes (comment) Current home services: DME(rw,cane,3n1) Criminal Activity/Legal Involvement Pertinent to Current Situation/Hospitalization: No - Comment as needed  Activities of Daily Living Home Assistive Devices/Equipment: None ADL Screening (condition at time of admission) Patient's cognitive ability adequate to safely complete daily activities?: No Is the patient deaf or have difficulty hearing?: No Does the patient have difficulty seeing, even when wearing glasses/contacts?: No Does the patient have difficulty concentrating, remembering, or making decisions?: Yes Patient able to express need for assistance with ADLs?: Yes Does the patient have difficulty dressing or bathing?: Yes Independently performs ADLs?: No Communication: Needs assistance Is this a change from baseline?: Change from baseline, expected to last >3 days Dressing (OT): Needs  assistance Is this a change from baseline?: Change from baseline, expected to last >3 days Grooming: Needs assistance Is this a change from baseline?: Change from baseline, expected to last >3 days Feeding: Needs assistance Is this a change from baseline?: Change from baseline, expected to last >3 days Bathing: Needs assistance Is this a change from baseline?: Change from baseline, expected to last >3 days Toileting: Needs assistance Is this a change from baseline?: Change from baseline, expected to last >3days In/Out Bed: Needs assistance Is this a change from baseline?: Change from baseline, expected to last >3 days Walks in Home: Needs assistance Is this a change from baseline?: Change from baseline, expected to last >3 days Does the patient have difficulty walking or climbing stairs?: Yes Weakness of Legs: None Weakness of Arms/Hands: None  Permission Sought/Granted Permission sought to share information with : Case Manager Permission granted to share information with : Yes, Verbal Permission Granted  Share Information with NAMEOlegario Messier [facility] [phone]           Emotional Assessment Appearance:: Appears stated age Attitude/Demeanor/Rapport: Gracious Affect (typically observed): Accepting Orientation: : Oriented to Self, Oriented to Place, Oriented to  Time, Oriented to Situation Alcohol / Substance Use: Not Applicable Psych Involvement: No (comment)  Admission diagnosis:  Dehydration [E86.0] Closed fracture of proximal end of left humerus with routine healing, unspecified fracture morphology, subsequent encounter [S42.202D] Other closed fracture of second lumbar vertebra with routine healing, subsequent encounter [S32.028D] Patient Active Problem List  Diagnosis Date Noted  . Humerus fracture 05/23/2019  . Closed lumbar vertebral fracture ([place]) 05/23/2019  . Leukocytosis 05/23/2019  . Hyperglycemia 05/23/2019  . Health care maintenance 04/09/2015  . H/O total hip  arthroplasty 11/09/2014  . Osteomyelitis of right hip ([place]) 07/12/2014  . Chest pain 06/22/2014  . Anemia due to blood loss 06/20/2014  . Status post revision of total hip replacement 06/08/2014  . Anxiety state 03/05/2008  . FATIGUE 03/05/2008  . Uncontrolled insulin dependent diabetes mellitus 01/02/2008  . Hyperlipidemia 01/02/2008  . HTN (hypertension) 01/02/2008   PCP:  Berkley Harvey, NP Pharmacy:   [id number] [place] [place] [address] [place] [address] Phone: [phone] Fax: [phone]  [facility] [place], [place] - [address] [place] [address] Phone: [phone] Fax: [phone]     Social Determinants of Health ([place]) Interventions    Readmission Risk Interventions No flowsheet data found.

## 2019-05-23 NOTE — H&P (Signed)
History and Physical    [name]:[id number] DOB: August 07, 1950 DOA: 05/22/2019  PCP: Iona Hansen, NP Patient coming from: Home  Chief Complaint: Back pain, arm pain  HPI: Kelly Richardson is a 68 y.o. female with medical history significant of arthritis, insulin-dependent diabetes mellitus, hypertension, seizures recently seen in the ED on 11/12 after mechanical fall and found to have acute minimally displaced comminuted fracture of the left humeral head and neck and acute L2 inferior endplate compression fracture with minimal height loss centrally and no retropulsion.  Orthopedics was consulted and recommended outpatient follow-up.  Neurosurgery was also consulted, TLSO brace was placed, and outpatient follow-up recommended.  She was discharged with walker, wheelchair, and plan for outpatient PT.  She presented to [facility] long ED after being found at home laying in her feces and urine for days.  Patient has not been able to care for herself secondary to pain from recent fractures.  She has no other complaints.  States she fell on 11/12 as it was raining and she slipped on a wet surface in the driveway.  Denies hitting her head or loss of consciousness at that time.  ED Course: Afebrile.  WBC count 16.0.  UA pending.  Patient received oxycodone and 1 L normal saline bolus.  Review of Systems:  All systems reviewed and apart from history of presenting illness, are negative.  Past Medical History:  Diagnosis Date   Arthritis    Complication of anesthesia    Diabetes mellitus without complication (HCC)    Heart murmur    as a child   Hypertension    PONV (postoperative nausea and vomiting)    Seizures (HCC)    Shortness of breath dyspnea    sometimes  at night    Past Surgical History:  Procedure Laterality Date   ABDOMINAL HYSTERECTOMY     CHOLECYSTECTOMY  1995   COLONOSCOPY     EXCISIONAL TOTAL HIP ARTHROPLASTY WITH ANTIBIOTIC SPACERS Right 09/07/2014   Procedure: Excision Total Hip Arthroplasty, Place Antibiotic Spacer;  Surgeon: Nadara Mustard, MD;  Location: [facility] OR;  Service: Orthopedics;  Laterality: Right;   INCISION AND DRAINAGE HIP Right 06/08/2014   Procedure: Irrigation and Debridement Right Hip, Revision of Cup and Head of Total Hip with  Placement  of Antibiotic Spacer;  Surgeon: Nadara Mustard, MD;  Location: [facility] OR;  Service: Orthopedics;  Laterality: Right;   TONSILLECTOMY     TOTAL HIP ARTHROPLASTY Right 2011   TOTAL HIP REVISION Right 11/09/2014   Procedure: TOTAL HIP REVISION;  Surgeon: Nadara Mustard, MD;  Location: [facility] OR;  Service: Orthopedics;  Laterality: Right;     reports that she has never smoked. She has never used smokeless tobacco. She reports that she does not drink alcohol or use drugs.  Allergies  Allergen Reactions   Metformin Other (See Comments)    REACTION: faintness, DIZZINESS   Penicillins Anaphylaxis   Seldane [Terfenadine] Other (See Comments)    N/V, increased heart rate, seizures, hallinations.   Sulfonamide Derivatives Anaphylaxis   Vicks Formula 44 Cough Relief [Dextromethorphan Hbr] Other (See Comments)    Runs blood pressure up    Xanax [Alprazolam] Other (See Comments)    Pt does not want Xanax--history of addiction.   Iohexol Other (See Comments)     Desc: pt states she [place] and immediately experienced profuse vomiting and sweating.  She said they treated her like she was having a reaction  and doesn't remember much else or if meds were given., Onset Date: [id number]    Latex Other (See Comments)    BLISTERING   Other Nausea And Vomiting    "Anything I have ever been given more nausea just increases it."   Percocet [Oxycodone-Acetaminophen] Other (See Comments)    Hallucinations    No family history on file.  Prior to Admission medications   Medication Sig Start Date End Date Taking? Authorizing Provider  ACCU-CHEK AVIVA PLUS test strip 1 each by Other route  daily. 05/02/19  Yes [provider]  aspirin 81 MG chewable tablet Chew 81 mg by mouth every evening.    Yes [provider]  atorvastatin (LIPITOR) 20 MG tablet Take 20 mg by mouth every evening.  03/11/19  Yes [provider]  baclofen (LIORESAL) 10 MG tablet Take 1 tablet (10 mg total) by mouth 3 (three) times daily. 05/18/19  Yes Gareth Morgan, MD  insulin aspart (NOVOLOG FLEXPEN) 100 UNIT/ML FlexPen Inject 15 Units as directed 3 (three) times daily with meals. Per sliding scale and not to exceed 81 units daily.  04/05/19  Yes [provider]  Insulin Detemir (LEVEMIR FLEXTOUCH) 100 UNIT/ML Pen Inject 40 Units into the skin at bedtime. Patient taking differently: Inject 50 Units into the skin at bedtime.  12/09/16  Yes Sherwood Gambler, MD  Insulin Pen Needle (FIFTY50 PEN NEEDLES) 31G X 8 MM MISC 1 each by Other route daily. 02/15/18  Yes [provider]  ketoconazole (NIZORAL) 2 % shampoo Apply 1 application topically daily as needed for irritation.  04/17/19  Yes [provider]  lidocaine (LIDODERM) 5 % Place 1 patch onto the skin daily. Remove & Discard patch within 12 hours or as directed by MD 05/18/19  Yes Gareth Morgan, MD  lisinopril (PRINIVIL,ZESTRIL) 2.5 MG tablet Take 2.5 mg by mouth every evening.  06/24/17  Yes [provider]  oxyCODONE (ROXICODONE) 5 MG immediate release tablet Take 1-2 tablets (5-10 mg total) by mouth every 4 (four) hours as needed for severe pain. 05/18/19  Yes Gareth Morgan, MD    Physical Exam: Vitals:   05/23/19 0006 05/23/19 0346 05/23/19 0424 05/23/19 0428  BP: 108/69 126/78  (!) 137/98  Pulse: (!) 107 (!) 103  (!) 108  Resp: 17 15  (!) 24  Temp: 98.5 F (36.9 C) 98.7 F (37.1 C)  98.3 F (36.8 C)  TempSrc: Oral Oral  Oral  SpO2: 96% 100%  98%  Weight:   97.7 kg   Height:   5\' 2"  (1.575 m)     Physical Exam  Constitutional: She is oriented to person, place, and time. She  appears well-developed and well-nourished. No distress.  HENT:  Head: Normocephalic.  Eyes: Right eye exhibits no discharge. Left eye exhibits no discharge.  Neck: Neck supple.  Cardiovascular: Normal rate, regular rhythm and intact distal pulses.  Pulmonary/Chest: Effort normal and breath sounds normal. No respiratory distress. She has no wheezes. She has no rales.  Anterior lung fields clear to auscultation  Abdominal: Soft. Bowel sounds are normal. She exhibits no distension. There is no abdominal tenderness. There is no guarding.  Musculoskeletal:        General: Edema present.     Comments: +1 edema at the level of the ankles  Neurological: She is alert and oriented to person, place, and time.  Skin: Skin is warm and dry. She is not diaphoretic.     Labs on Admission: I have personally reviewed  following labs and imaging studies  CBC: Recent Labs  Lab 05/22/19 2325 05/23/19 0451  WBC 16.0* 16.6*  HGB 12.3 11.8*  HCT 44.1 43.8  MCV 71.0* 73.5*  PLT 363 364   Basic Metabolic Panel: Recent Labs  Lab 05/22/19 2325 05/23/19 0451  NA 135 135  K 4.6 4.3  CL 101 104  CO2 21* 18*  GLUCOSE 282* 282*  BUN 17 17  CREATININE 0.86 0.84  CALCIUM 9.4 8.9   GFR: Estimated Creatinine Clearance: 72.8 mL/min (by C-G formula based on SCr of 0.84 mg/dL). Liver Function Tests: Recent Labs  Lab 05/22/19 2325  AST 21  ALT 25  ALKPHOS 92  BILITOT 1.1  PROT 8.3*  ALBUMIN 3.8   No results for input(s): LIPASE, AMYLASE in the last 168 hours. No results for input(s): AMMONIA in the last 168 hours. Coagulation Profile: No results for input(s): INR, PROTIME in the last 168 hours. Cardiac Enzymes: No results for input(s): CKTOTAL, CKMB, CKMBINDEX, TROPONINI in the last 168 hours. BNP (last 3 results) No results for input(s): PROBNP in the last 8760 hours. HbA1C: No results for input(s): HGBA1C in the last 72 hours. CBG: Recent Labs  Lab 05/23/19 0730  GLUCAP 226*   Lipid  Profile: No results for input(s): CHOL, HDL, LDLCALC, TRIG, CHOLHDL, LDLDIRECT in the last 72 hours. Thyroid Function Tests: No results for input(s): TSH, T4TOTAL, FREET4, T3FREE, THYROIDAB in the last 72 hours. Anemia Panel: No results for input(s): VITAMINB12, FOLATE, FERRITIN, TIBC, IRON, RETICCTPCT in the last 72 hours. Urine analysis:    Component Value Date/Time   COLORURINE YELLOW 05/23/2019 0401   APPEARANCEUR HAZY (A) 05/23/2019 0401   LABSPEC 1.029 05/23/2019 0401   PHURINE 5.0 05/23/2019 0401   GLUCOSEU >=500 (A) 05/23/2019 0401   GLUCOSEU NEGATIVE 07/26/2006 1044   HGBUR NEGATIVE 05/23/2019 0401   BILIRUBINUR NEGATIVE 05/23/2019 0401   KETONESUR 80 (A) 05/23/2019 0401   PROTEINUR NEGATIVE 05/23/2019 0401   UROBILINOGEN 0.2 09/08/2012 1444   NITRITE NEGATIVE 05/23/2019 0401   LEUKOCYTESUR NEGATIVE 05/23/2019 0401    Radiological Exams on Admission: No results found.  EKG: Independently reviewed.  Sinus tachycardia, heart rate 108.  Nonspecific T wave abnormality.  No significant change since prior tracing.  Assessment/Plan Principal Problem:   Humerus fracture Active Problems:   HTN (hypertension)   Closed lumbar vertebral fracture (HCC)   Leukocytosis   Hyperglycemia   Left humeral head and neck fracture secondary to recent mechanical fall Imaging done 11/12 showing acute minimally displaced comminuted fracture of the left humeral head and neck. Orthopedics was consulted during her prior ED visit and recommended outpatient follow-up. -Ensure outpatient orthopedic follow-up -PT/OT evaluation -Pain management: Toradol as needed, OxyIR as needed, baclofen  Lumbar compression fracture secondary to recent mechanical fall Imaging done 11/12 showing acute L2 inferior endplate compression fracture with minimal height loss centrally and no retropulsion.  Neurosurgery was consulted during her recent ED visit.  TLSO brace was placed, and outpatient follow-up recommended.    -Ensure outpatient neurosurgery follow-up -Continue TLSO brace -PT/OT evaluation -Patient has not been able to care for herself secondary to pain from recent fractures.  She will need safe placement.  Social work consult placed. -Pain management  Leukocytosis Patient is afebrile.  White blood cell count 16.0.  UA (not clean catch) with negative nitrite, negative leukocytes, 6-10 WBCs, and rare bacteria. -Chest x-ray to rule out pneumonia -Urine culture -Continue to monitor WBC count  Hyperglycemia/ ?DKA, insulin-dependent diabetes mellitus Blood glucose  282.  Bicarb 18, anion gap 13.  UA with ketones. -Received 1 L normal saline bolus.  Continue IV fluid hydration. -Check beta hydroxybutyrate acid level -Continue home Levemir 50 units at bedtime -Sliding scale insulin and CBG checks  Hypertension -Continue home lisinopril  Hyperlipidemia -Continue Lipitor  DVT prophylaxis: Subcutaneous heparin Code Status: Full code Family Communication: No family available. Disposition Plan: Anticipate discharge after clinical improvement. Consults called: None Admission status: It is my clinical opinion that referral for OBSERVATION is reasonable and necessary in this patient based on the above information provided. The aforementioned taken together are felt to place the patient at high risk for further clinical deterioration. However it is anticipated that the patient may be medically stable for discharge from the hospital within 24 to 48 hours.  The medical decision making on this patient was of high complexity and the patient is at high risk for clinical deterioration, therefore this is a level 3 visit.  Kellie Chisolm MD [facility] Pager [phone]- [phone]  If 7PM-7AM, please contact night-coverage [email or web address] Password [email or web address]  05/23/2019, 7:54 AM

## 2019-05-23 NOTE — Progress Notes (Signed)
PROGRESS NOTE    Kelly Richardson  ERX:[id number] DOB: June 30, 1951 DOA: 05/22/2019 PCP: Berkley Harvey, NP   Brief Narrative:  Patient is a 68 year old female with history of arthritis, insulin-dependent diabetes, hypertension, seizures who presented to the emergency department after she fell at home and lying on the floor for prolonged time.  She was seen in the emergency department on 11/12 after a mechanical fall and found to have acute minimally displaced comminuted fracture of the left humeral head and neck and acute L2 inferior endplate compression fracture with minimal height loss.  Orthopedics and neurosurgery were consulted at the time and they recommended to follow her as an outpatient.  After she was discharged home, she slipped on a wet surface on the driveway and fell.  Patient admitted for the evaluation of PT/OT.  Assessment & Plan:   Principal Problem:   Humerus fracture Active Problems:   HTN (hypertension)   Closed lumbar vertebral fracture (HCC)   Leukocytosis   Hyperglycemia   Left humeral head/neck fracture secondary to mechanical fall: Imaging done on 11/12 showed acute minimally displaced comminuted fracture of the left humeral head/neck.  Orthopedics was consulted at the time and recommended to follow as an outpatient.  She has a sling on her left arm.  PT/OT again consulted.  Continue pain management.  Lumbar compression fracture: Imaging done on 11/12 showed acute L2 inferior endplate compression fracture with minimal height loss.  Neurosurgery was consulted at the time and recommended outpatient follow-up.  TLSO brace recommended.  Pending PT/OT evaluation.  Denies any back pain today.  Leukocytosis: Most likely reactive.  UA not impressive for UTI.  Urine culture has been sent.  Chest x-ray did not show pneumonia.  Hyperglycemia/insulin-dependent diabetes mellitus: Continue current insulin regimen.  Anion gap is negative.  Urine showed ketones and beta  hydroxybutyrate acid level is positive.  No need of insulin drip at this time.  Hemoglobin A1c is 10.6.  Will request for diabetic coordinator  consultation.  Hypertension: Above stable continue home lisinopril  Hyperlipidemia: Continue Lipitor           DVT prophylaxis: Heparin [place] Code Status: Full Family Communication: None.  Disposition Plan: Pending PT/OT evaluation   Consultants: None  Procedures: None  Antimicrobials:  Anti-infectives (From admission, onward)   None      Subjective:  Patient seen and examined the bedside this morning.  Hemodynamically stable.  Comfortable during my evaluation.  She cannot participate with PT earlier and also vomited.  PT evaluation pending.  Denies any specific complaints. Objective: Vitals:   05/23/19 0006 05/23/19 0346 05/23/19 0424 05/23/19 0428  BP: 108/69 126/78  (!) 137/98  Pulse: (!) 107 (!) 103  (!) 108  Resp: 17 15  (!) 24  Temp: 98.5 F (36.9 C) 98.7 F (37.1 C)  98.3 F (36.8 C)  TempSrc: Oral Oral  Oral  SpO2: 96% 100%  98%  Weight:   97.7 kg   Height:   5\' 2"  (1.575 m)     Intake/Output Summary (Last 24 hours) at 05/23/2019 1219 Last data filed at 05/23/2019 0946 Gross per 24 hour  Intake -  Output 50 ml  Net -50 ml   Filed Weights   05/23/19 0424  Weight: 97.7 kg    Examination:  General exam: Not in distress, obese, deconditioned HEENT:PERRL, Ear/Nose normal on gross exam Respiratory system: Bilateral equal air entry, normal vesicular breath sounds, no wheezes or crackles  Cardiovascular system: S1 & S2 heard, RRR.  No JVD, murmurs, rubs, gallops or clicks. No pedal edema. Gastrointestinal system: Abdomen is nondistended, soft and nontender. No organomegaly or masses felt. Normal bowel sounds heard. Central nervous system: Alert and oriented. No focal neurological deficits. Extremities: No edema, no clubbing ,no cyanosis, distal peripheral pulses palpable.  Sling on the left arm Skin: No rashes,  lesions or ulcers,no icterus ,no pallor .     Data Reviewed: I have personally reviewed following labs and imaging studies  CBC: Recent Labs  Lab 05/22/19 2325 05/23/19 0451  WBC 16.0* 16.6*  HGB 12.3 11.8*  HCT 44.1 43.8  MCV 71.0* 73.5*  PLT 363 364   Basic Metabolic Panel: Recent Labs  Lab 05/22/19 2325 05/23/19 0451  NA 135 135  K 4.6 4.3  CL 101 104  CO2 21* 18*  GLUCOSE 282* 282*  BUN 17 17  CREATININE 0.86 0.84  CALCIUM 9.4 8.9   GFR: Estimated Creatinine Clearance: 72.8 mL/min (by C-G formula based on SCr of 0.84 mg/dL). Liver Function Tests: Recent Labs  Lab 05/22/19 2325  AST 21  ALT 25  ALKPHOS 92  BILITOT 1.1  PROT 8.3*  ALBUMIN 3.8   No results for input(s): LIPASE, AMYLASE in the last 168 hours. No results for input(s): AMMONIA in the last 168 hours. Coagulation Profile: No results for input(s): INR, PROTIME in the last 168 hours. Cardiac Enzymes: No results for input(s): CKTOTAL, CKMB, CKMBINDEX, TROPONINI in the last 168 hours. BNP (last 3 results) No results for input(s): PROBNP in the last 8760 hours. HbA1C: Recent Labs    05/23/19 0451  HGBA1C 10.6*   CBG: Recent Labs  Lab 05/23/19 0730 05/23/19 1123  GLUCAP 226* 200*   Lipid Profile: No results for input(s): CHOL, HDL, LDLCALC, TRIG, CHOLHDL, LDLDIRECT in the last 72 hours. Thyroid Function Tests: No results for input(s): TSH, T4TOTAL, FREET4, T3FREE, THYROIDAB in the last 72 hours. Anemia Panel: No results for input(s): VITAMINB12, FOLATE, FERRITIN, TIBC, IRON, RETICCTPCT in the last 72 hours. Sepsis Labs: No results for input(s): PROCALCITON, LATICACIDVEN in the last 168 hours.  No results found for this or any previous visit (from the past 240 hour(s)).       Radiology Studies: Dg Chest Port 1 View  Result Date: 05/23/2019 CLINICAL DATA:  Leukocytosis. EXAM: PORTABLE CHEST 1 VIEW COMPARISON:  05/06/2018 FINDINGS: Lungs are clear. Cardiomediastinal contours are  normal. Impacted left humeral neck fracture as before. IMPRESSION: 1. No active cardiopulmonary disease. 2. Left humeral fracture of surgical neck with impaction as before. Electronically Signed   By: Donzetta Kohut M.D.   On: 05/23/2019 09:41        Scheduled Meds: . aspirin  81 mg Oral QPM  . atorvastatin  20 mg Oral QPM  . baclofen  10 mg Oral TID  . heparin  5,000 Units Subcutaneous Q8H  . insulin aspart  0-5 Units Subcutaneous QHS  . insulin aspart  0-9 Units Subcutaneous TID WC  . insulin detemir  50 Units Subcutaneous QHS  . lisinopril  2.5 mg Oral QPM   Continuous Infusions: . sodium chloride 125 mL/hr at 05/23/19 0451     LOS: 0 days    Time spent:25 mins. More than 50% of that time was spent in counseling and/or coordination of care.      Burnadette Pop, MD [facility] Pager [phone]  If 7PM-7AM, please contact night-coverage [email or web address] Password [email or web address] 05/23/2019, 12:19 PM

## 2019-05-23 NOTE — Progress Notes (Signed)
PT Cancellation Note  Patient Details Name: Kelly Richardson MRN: [id number] DOB: 12/15/1950   Cancelled Treatment:    Reason Eval/Treat Not Completed: Medical issues which prohibited therapy. Pt  c/o Left arm pain during attempted roll to right side as requested by me. Pt started vomiting without warning and it appeared blood was present in vomit. RN called and is tending to the patient. Please clarify if there are any weight bearing restrictions. Pt currently has a sling and a TLSO brace. Will attempt PT evaluation another day.    Lelon Mast 05/23/2019, 11:19 AM

## 2019-05-23 NOTE — ED Provider Notes (Signed)
11:45 PM  Assumed care from Dr. Billy Fischer.  Patient is a 68 year old female who was seen in the emergency department on 05/18/2019 after she had a fall and found to have a left humerus fracture and L2 fracture.  Was sent home with pain medication but now unable to get out of bed, not eating or drinking, urinating and defecating on herself due to severe pain.  Pain is uncontrolled with oral medications at home.  Plan is to obtain labs, EKG and admit for pain control.  Likely needs placement to rehab per primary team evaluating patient.  Has TLSO brace.  Home health resources had been ordered for patient.  Dr. Sharol Given with orthopedics and Dr. Ellene Route with neurosurgery were consulted during previous ED visit.  Recommended outpatient management.  2:30 AM  Pt's labs show a leukocytosis which is likely reactive.  Currently getting hydrated.  Will discuss with medicine for admission.   2:43 AM Discussed patient's case with hospitalist, Dr. Marlowe Sax.  I have recommended admission and patient (and family if present) agree with this plan. Admitting physician will place admission orders.   I reviewed all nursing notes, vitals, pertinent previous records and interpreted all EKGs, lab and urine results, imaging (as available).        EKG Interpretation  Date/Time:  Monday May 22 2019 23:59:49 EST Ventricular Rate:  108 PR Interval:    QRS Duration: 93 QT Interval:  328 QTC Calculation: 440 R Axis:   51 Text Interpretation: Sinus tachycardia Low voltage, precordial leads No significant change since last tracing Confirmed by Ward, Cyril Mourning [phone]) on 05/23/2019 12:06:17 AM         Ward, Delice Bison, DO 05/23/19 4034

## 2019-05-24 ENCOUNTER — Encounter (HOSPITAL_COMMUNITY): Payer: Self-pay

## 2019-05-24 LAB — CBC WITH DIFFERENTIAL/PLATELET
Abs Immature Granulocytes: 0.08 10*3/uL — ABNORMAL HIGH (ref 0.00–0.07)
Basophils Absolute: 0.1 10*3/uL (ref 0.0–0.1)
Basophils Relative: 1 %
Eosinophils Absolute: 0.8 10*3/uL — ABNORMAL HIGH (ref 0.0–0.5)
Eosinophils Relative: 7 %
HCT: 34.1 % — ABNORMAL LOW (ref 36.0–46.0)
Hemoglobin: 9 g/dL — ABNORMAL LOW (ref 12.0–15.0)
Immature Granulocytes: 1 %
Lymphocytes Relative: 22 %
Lymphs Abs: 2.6 10*3/uL (ref 0.7–4.0)
MCH: 19.9 pg — ABNORMAL LOW (ref 26.0–34.0)
MCHC: 26.4 g/dL — ABNORMAL LOW (ref 30.0–36.0)
MCV: 75.4 fL — ABNORMAL LOW (ref 80.0–100.0)
Monocytes Absolute: 1 10*3/uL (ref 0.1–1.0)
Monocytes Relative: 9 %
Neutro Abs: 7 10*3/uL (ref 1.7–7.7)
Neutrophils Relative %: 60 %
Platelets: 240 10*3/uL (ref 150–400)
RBC: 4.52 MIL/uL (ref 3.87–5.11)
RDW: 18.4 % — ABNORMAL HIGH (ref 11.5–15.5)
WBC: 11.5 10*3/uL — ABNORMAL HIGH (ref 4.0–10.5)
nRBC: 0 % (ref 0.0–0.2)

## 2019-05-24 LAB — GLUCOSE, CAPILLARY
Glucose-Capillary: 144 mg/dL — ABNORMAL HIGH (ref 70–99)
Glucose-Capillary: 118 mg/dL — ABNORMAL HIGH (ref 70–99)
Glucose-Capillary: 168 mg/dL — ABNORMAL HIGH (ref 70–99)
Glucose-Capillary: 281 mg/dL — ABNORMAL HIGH (ref 70–99)

## 2019-05-24 MED ORDER — OXYCODONE HCL 5 MG PO TABS: 5.0000 mg | ORAL_TABLET | ORAL | Status: DC | PRN

## 2019-05-24 MED ORDER — INSULIN DETEMIR 100 UNIT/ML ~~LOC~~ SOLN
35.0000 [IU] | Freq: Every day | SUBCUTANEOUS | Status: DC
  Administered 2019-05-24 – 2019-05-25 (×2): 35 [IU] via SUBCUTANEOUS
  Filled 2019-05-24 (×3): qty 0.35

## 2019-05-24 MED ORDER — INSULIN ASPART 100 UNIT/ML ~~LOC~~ SOLN
6.0000 [IU] | Freq: Three times a day (TID) | SUBCUTANEOUS | Status: DC
  Administered 2019-05-25 – 2019-05-26 (×5): 6 [IU] via SUBCUTANEOUS

## 2019-05-24 NOTE — Progress Notes (Signed)
OT Cancellation Note  Patient Details Name: Kelly Richardson MRN: [id number] DOB: 05-Feb-1951   Cancelled Treatment:    Reason Eval/Treat Not Completed: Other (comment)   Pt initially agreed to OT eval.  OT took back covers and smell was foul.  Sling and back brace in bed and had foul oder.    ( both items beside pt in bed under covers)  Nursing sec ordered new sling. Back brace in bathroom.  Upon repositioning pt in bed pt screamed in pain.  OT gently repositioned LUE and pt screamed. Pt then screamed before OT moved pt.    Discussed pain with RN and discussed need for  ST SNF  For pt as she does not have A at home.  Pt will need better pain control to work with therapy.    Pt agreed to  Work with OT next day if pain was better.    Kari Baars, OT Acute Rehabilitation Services [phone] Office- [phone], Edwena Felty D 05/24/2019, 2:33 PM

## 2019-05-24 NOTE — Progress Notes (Signed)
PROGRESS NOTE    Kelly Richardson  HLK:[id number] DOB: 03/25/1951 DOA: 05/22/2019 PCP: Iona Hansen, NP   Brief Narrative:  Patient is a 68 year old female with history of arthritis, insulin-dependent diabetes, hypertension, seizures who presented to the emergency department after she fell at home and lying on the floor for prolonged time.  She was seen in the emergency department on 11/12 after a mechanical fall and found to have acute minimally displaced comminuted fracture of the left humeral head and neck and acute L2 inferior endplate compression fracture with minimal height loss.  Orthopedics and neurosurgery were consulted at the time and they recommended to follow her as an outpatient.  After she was discharged home, she slipped on a wet surface on the driveway and fell.  Patient admitted for the evaluation of PT/OT.Still waiting for PT/OT evaluation.  Assessment & Plan:   Principal Problem:   Humerus fracture Active Problems:   HTN (hypertension)   Closed lumbar vertebral fracture (HCC)   Leukocytosis   Hyperglycemia   Fall   Left humeral head/neck fracture secondary to mechanical fall: Imaging done on 11/12 showed acute minimally displaced comminuted fracture of the left humeral head/neck.  Orthopedics was consulted at the time and recommended to follow as an outpatient.  She has a sling on her left arm.  PT/OT again consulted.  Continue pain management.  Lumbar compression fracture: Imaging done on 11/12 showed acute L2 inferior endplate compression fracture with minimal height loss.  Neurosurgery was consulted at the time and recommended outpatient follow-up.  TLSO brace recommended.  Pending PT/OT evaluation.  Complains of  back pain today.  Leukocytosis: Most likely reactive.  UA not impressive for UTI.  Urine culture has been sent.  Chest x-ray did not show pneumonia.  Hyperglycemia/insulin-dependent diabetes mellitus: Continue current insulin regimen.  Anion gap is  negative.  Urine showed ketones and beta hydroxybutyrate acid level is positive.  No need of insulin drip at this time.  Hemoglobin A1c is 10.6. Diabetic coordinator  following.  Hypertension: Above stable continue home lisinopril  Hyperlipidemia: Continue Lipitor  Microcytic anemia: We will request for iron studies.           DVT prophylaxis: Heparin [place] Code Status: Full Family Communication: None.  Disposition Plan: Pending PT/OT evaluation   Consultants: None  Procedures: None  Antimicrobials:  Anti-infectives (From admission, onward)   None      Subjective: Patient seen and examined the bedside this morning.  Hemodynamically stable.  Was she was in deep sleep.  I woke up on calling her name.  She was complaining of back pain and pain on her left shoulder.   Objective: Vitals:   05/23/19 0428 05/23/19 1330 05/23/19 2157 05/24/19 0601  BP: (!) 137/98 114/73 (!) 128/55 (!) 155/97  Pulse: (!) 108 (!) 105 97 88  Resp: (!) 24 20    Temp: 98.3 F (36.8 C) 98.1 F (36.7 C) 98.2 F (36.8 C) 98 F (36.7 C)  TempSrc: Oral Oral Oral Oral  SpO2: 98% 95% 90% 96%  Weight:      Height:        Intake/Output Summary (Last 24 hours) at 05/24/2019 1224 Last data filed at 05/24/2019 0400 Gross per 24 hour  Intake 2421.61 ml  Output -  Net 2421.61 ml   Filed Weights   05/23/19 0424  Weight: 97.7 kg    Examination:  General exam: Not in distress, obese, deconditioned HEENT:PERRL, Ear/Nose normal on gross exam Respiratory system: Bilateral equal  air entry, normal vesicular breath sounds, no wheezes or crackles  Cardiovascular system: S1 & S2 heard, RRR. No JVD, murmurs, rubs, gallops or clicks. No pedal edema. Gastrointestinal system: Abdomen is nondistended, soft and nontender. No organomegaly or masses felt. Normal bowel sounds heard. Central nervous system: Alert and oriented. No focal neurological deficits. Extremities: No edema, no clubbing ,no cyanosis,  distal peripheral pulses palpable.  Sling on the left arm Skin: No rashes, lesions or ulcers,no icterus ,no pallor .     Data Reviewed: I have personally reviewed following labs and imaging studies  CBC: Recent Labs  Lab 05/22/19 2325 05/23/19 0451 05/24/19 0815  WBC 16.0* 16.6* 11.5*  NEUTROABS  --   --  7.0  HGB 12.3 11.8* 9.0*  HCT 44.1 43.8 34.1*  MCV 71.0* 73.5* 75.4*  PLT 363 364 737   Basic Metabolic Panel: Recent Labs  Lab 05/22/19 2325 05/23/19 0451  NA 135 135  K 4.6 4.3  CL 101 104  CO2 21* 18*  GLUCOSE 282* 282*  BUN 17 17  CREATININE 0.86 0.84  CALCIUM 9.4 8.9   GFR: Estimated Creatinine Clearance: 72.8 mL/min (by C-G formula based on SCr of 0.84 mg/dL). Liver Function Tests: Recent Labs  Lab 05/22/19 2325  AST 21  ALT 25  ALKPHOS 92  BILITOT 1.1  PROT 8.3*  ALBUMIN 3.8   No results for input(s): LIPASE, AMYLASE in the last 168 hours. No results for input(s): AMMONIA in the last 168 hours. Coagulation Profile: No results for input(s): INR, PROTIME in the last 168 hours. Cardiac Enzymes: No results for input(s): CKTOTAL, CKMB, CKMBINDEX, TROPONINI in the last 168 hours. BNP (last 3 results) No results for input(s): PROBNP in the last 8760 hours. HbA1C: Recent Labs    05/23/19 0451  HGBA1C 10.6*   CBG: Recent Labs  Lab 05/23/19 1123 05/23/19 1736 05/23/19 2155 05/24/19 0747 05/24/19 1157  GLUCAP 200* 204* 224* 144* 118*   Lipid Profile: No results for input(s): CHOL, HDL, LDLCALC, TRIG, CHOLHDL, LDLDIRECT in the last 72 hours. Thyroid Function Tests: No results for input(s): TSH, T4TOTAL, FREET4, T3FREE, THYROIDAB in the last 72 hours. Anemia Panel: No results for input(s): VITAMINB12, FOLATE, FERRITIN, TIBC, IRON, RETICCTPCT in the last 72 hours. Sepsis Labs: No results for input(s): PROCALCITON, LATICACIDVEN in the last 168 hours.  Recent Results (from the past 240 hour(s))  SARS CORONAVIRUS 2 (TAT 6-24 HRS) Nasopharyngeal  Nasopharyngeal Swab     Status: None   Collection Time: 05/23/19 12:43 AM   Specimen: Nasopharyngeal Swab  Result Value Ref Range Status   SARS Coronavirus 2 NEGATIVE NEGATIVE Final    Comment: (NOTE) SARS-CoV-2 target nucleic acids are NOT DETECTED. The SARS-CoV-2 RNA is generally detectable in upper and lower respiratory specimens during the acute phase of infection. Negative results do not preclude SARS-CoV-2 infection, do not rule out co-infections with other pathogens, and should not be used as the sole basis for treatment or other patient management decisions. Negative results must be combined with clinical observations, patient history, and epidemiological information. The expected result is Negative. Fact Sheet for Patients: SugarRoll.be Fact Sheet for Healthcare Providers: https://www.woods-mathews.com/ This test is not yet approved or cleared by the [place] FDA and  has been authorized for detection and/or diagnosis of SARS-CoV-2 by FDA under an Emergency Use Authorization (EUA). This EUA will remain  in effect (meaning this test can be used) for the duration of the COVID-19 declaration under Section 56 4(b)(1) of the Act, 21  U.S.C. section 360bbb-3(b)(1), unless the authorization is terminated or revoked sooner. Performed at [facility], [address] [place], [place] [address]          Radiology Studies: Dg Chest Port 1 View  Result Date: 05/23/2019 CLINICAL DATA:  Leukocytosis. EXAM: PORTABLE CHEST 1 VIEW COMPARISON:  05/06/2018 FINDINGS: Lungs are clear. Cardiomediastinal contours are normal. Impacted left humeral neck fracture as before. IMPRESSION: 1. No active cardiopulmonary disease. 2. Left humeral fracture of surgical neck with impaction as before. Electronically Signed   By: Donzetta KohutGeoffrey  Wile M.D.   On: 05/23/2019 09:41        Scheduled Meds: . aspirin  81 mg Oral QPM  . atorvastatin  20 mg Oral  QPM  . heparin  5,000 Units Subcutaneous Q8H  . insulin aspart  0-5 Units Subcutaneous QHS  . insulin aspart  0-9 Units Subcutaneous TID WC  . insulin aspart  6 Units Subcutaneous TID WC  . insulin detemir  50 Units Subcutaneous QHS  . lisinopril  2.5 mg Oral QPM   Continuous Infusions:    LOS: 1 day    Time spent:25 mins. More than 50% of that time was spent in counseling and/or coordination of care.      Burnadette PopAmrit Terriann Difonzo, MD [facility] Pager [phone]  If 7PM-7AM, please contact night-coverage [email or web address] Password [email or web address] 05/24/2019, 12:24 PM

## 2019-05-24 NOTE — TOC Initial Note (Signed)
Transition of Care [facility]) - Initial/Assessment Note    Patient Details  Name: Kelly Richardson MRN: [id number] Date of Birth: [birth year]  [facility] CM/SW Contact:    Dessa Phi, RN Phone Number: 05/24/2019, 2:37 PM  Clinical Narrative: Patient in agreement for short term rehab. Await patient to participate w/PT. Then wil fax out to SNF.                 Expected Discharge Plan: Skilled Nursing Facility Barriers to Discharge: Continued Medical Work up   Patient Goals and CMS Choice Patient states their goals for this hospitalization and ongoing recovery are:: go to rehab      Expected Discharge Plan and Services Expected Discharge Plan: [place]   Discharge Planning Services: CM Consult Post Acute Care Choice: [place] Living arrangements for the past 2 months: Single Family Home                                      Prior Living Arrangements/Services Living arrangements for the past 2 months: Single Family Home Lives with:: Significant Other Patient language and need for interpreter reviewed:: Yes Do you feel safe going back to the place where you live?: Yes      Need for Family Participation in Patient Care: No (Comment) Care giver support system in place?: Yes (comment) Current home services: DME(rw,cane,3n1) Criminal Activity/Legal Involvement Pertinent to Current Situation/Hospitalization: No - Comment as needed  Activities of Daily Living Home Assistive Devices/Equipment: None ADL Screening (condition at time of admission) Patient's cognitive ability adequate to safely complete daily activities?: No Is the patient deaf or have difficulty hearing?: No Does the patient have difficulty seeing, even when wearing glasses/contacts?: No Does the patient have difficulty concentrating, remembering, or making decisions?: Yes Patient able to express need for assistance with ADLs?: Yes Does the patient have  difficulty dressing or bathing?: Yes Independently performs ADLs?: No Communication: Needs assistance Is this a change from baseline?: Change from baseline, expected to last >3 days Dressing (OT): Needs assistance Is this a change from baseline?: Change from baseline, expected to last >3 days Grooming: Needs assistance Is this a change from baseline?: Change from baseline, expected to last >3 days Feeding: Needs assistance Is this a change from baseline?: Change from baseline, expected to last >3 days Bathing: Needs assistance Is this a change from baseline?: Change from baseline, expected to last >3 days Toileting: Needs assistance Is this a change from baseline?: Change from baseline, expected to last >3days In/Out Bed: Needs assistance Is this a change from baseline?: Change from baseline, expected to last >3 days Walks in Home: Needs assistance Is this a change from baseline?: Change from baseline, expected to last >3 days Does the patient have difficulty walking or climbing stairs?: Yes Weakness of Legs: None Weakness of Arms/Hands: None  Permission Sought/Granted Permission sought to share information with : Case Manager Permission granted to share information with : Yes, Verbal Permission Granted  Share Information with NAME: Hannah Beat [facility] [phone]           Emotional Assessment Appearance:: Appears stated age Attitude/Demeanor/Rapport: Gracious Affect (typically observed): Accepting Orientation: : Oriented to Self, Oriented to Place, Oriented to  Time, Oriented to Situation Alcohol / Substance Use: Not Applicable Psych Involvement: No (comment)  Admission diagnosis:  Dehydration [E86.0] Closed fracture of proximal end of left humerus with routine  healing, unspecified fracture morphology, subsequent encounter [S42.202D] Other closed fracture of second lumbar vertebra with routine healing, subsequent encounter [S32.028D] Patient Active Problem List   Diagnosis Date  Noted  . Humerus fracture 05/23/2019  . Closed lumbar vertebral fracture (HCC) 05/23/2019  . Leukocytosis 05/23/2019  . Hyperglycemia 05/23/2019  . Fall 05/23/2019  . Health care maintenance 04/09/2015  . H/O total hip arthroplasty 11/09/2014  . Osteomyelitis of right hip (HCC) 07/12/2014  . Chest pain 06/22/2014  . Anemia due to blood loss 06/20/2014  . Status post revision of total hip replacement 06/08/2014  . Anxiety state 03/05/2008  . FATIGUE 03/05/2008  . Uncontrolled insulin dependent diabetes mellitus 01/02/2008  . Hyperlipidemia 01/02/2008  . HTN (hypertension) 01/02/2008   PCP:  Iona Hansen, NP Pharmacy:   [phone] - [place], [place] - [address] AT [facility] [address] [place] [address] Phone: [phone] Fax: [phone]  [facility] [place], [place] - [address] [place] [address] Phone: [phone] Fax: [phone]     Social Determinants of Health (SDOH) Interventions    Readmission Risk Interventions No flowsheet data found.

## 2019-05-24 NOTE — Progress Notes (Signed)
Request new IV d/t position.  When team came she refused and stated she's leaving tomorrow morning.she would leave tonight if she could walk.  Explained rationale to patient, she continued to refuse.

## 2019-05-24 NOTE — Progress Notes (Signed)
PT Cancellation Note  Patient Details Name: Kelly Richardson MRN: [id number] DOB: 1951-02-04   Cancelled Treatment:    Reason Eval/Treat Not Completed: Pain limiting ability to participate;Fatigue/lethargy limiting ability to participate;Medical issues which prohibited therapy. Pt reported she may d/c home on Friday. I encouraged her to work with therapy tomorrow to better assess mobility. Pt agreeable.   Lelon Mast 05/24/2019, 10:09 AM

## 2019-05-25 ENCOUNTER — Encounter (HOSPITAL_COMMUNITY): Payer: Self-pay

## 2019-05-25 LAB — CBC WITH DIFFERENTIAL/PLATELET
Abs Immature Granulocytes: 0.1 10*3/uL — ABNORMAL HIGH (ref 0.00–0.07)
Basophils Absolute: 0.1 10*3/uL (ref 0.0–0.1)
Basophils Relative: 0 %
Eosinophils Absolute: 0.8 10*3/uL — ABNORMAL HIGH (ref 0.0–0.5)
Eosinophils Relative: 7 %
HCT: 32.2 % — ABNORMAL LOW (ref 36.0–46.0)
Hemoglobin: 8.9 g/dL — ABNORMAL LOW (ref 12.0–15.0)
Immature Granulocytes: 1 %
Lymphocytes Relative: 22 %
Lymphs Abs: 2.6 10*3/uL (ref 0.7–4.0)
MCH: 20.1 pg — ABNORMAL LOW (ref 26.0–34.0)
MCHC: 27.6 g/dL — ABNORMAL LOW (ref 30.0–36.0)
MCV: 72.9 fL — ABNORMAL LOW (ref 80.0–100.0)
Monocytes Absolute: 1 10*3/uL (ref 0.1–1.0)
Monocytes Relative: 9 %
Neutro Abs: 7.3 10*3/uL (ref 1.7–7.7)
Neutrophils Relative %: 61 %
Platelets: 291 10*3/uL (ref 150–400)
RBC: 4.42 MIL/uL (ref 3.87–5.11)
RDW: 18.6 % — ABNORMAL HIGH (ref 11.5–15.5)
WBC: 11.8 10*3/uL — ABNORMAL HIGH (ref 4.0–10.5)
nRBC: 0 % (ref 0.0–0.2)

## 2019-05-25 LAB — IRON AND TIBC
Iron: 26 ug/dL — ABNORMAL LOW (ref 28–170)
Saturation Ratios: 8 % — ABNORMAL LOW (ref 10.4–31.8)
TIBC: 322 ug/dL (ref 250–450)
UIBC: 296 ug/dL

## 2019-05-25 LAB — GLUCOSE, CAPILLARY
Glucose-Capillary: 162 mg/dL — ABNORMAL HIGH (ref 70–99)
Glucose-Capillary: 177 mg/dL — ABNORMAL HIGH (ref 70–99)
Glucose-Capillary: 206 mg/dL — ABNORMAL HIGH (ref 70–99)
Glucose-Capillary: 265 mg/dL — ABNORMAL HIGH (ref 70–99)

## 2019-05-25 LAB — FERRITIN: Ferritin: 13 ng/mL (ref 11–307)

## 2019-05-25 MED ORDER — MORPHINE SULFATE (PF) 2 MG/ML IV SOLN
2.0000 mg | INTRAVENOUS | Status: DC | PRN
  Administered 2019-05-25 (×2): 2 mg via INTRAVENOUS
  Filled 2019-05-25 (×3): qty 1

## 2019-05-25 MED ORDER — SODIUM CHLORIDE 0.9 % IV SOLN
510.0000 mg | Freq: Once | INTRAVENOUS | Status: AC
  Administered 2019-05-25: 09:00:00 510 mg via INTRAVENOUS
  Filled 2019-05-25: qty 17

## 2019-05-25 NOTE — TOC Progression Note (Signed)
Transition of Care [facility]) - Progression Note    Patient Details  Name: Kelly Richardson MRN: [id number] Date of Birth: 1950/11/17  Transition of Care  Medical Center) CM/SW Contact  Xander Jutras, Juliann Pulse, RN Phone Number: 05/25/2019, 1:15 PM  Clinical Narrative: Faxed out await bed offers.      Expected Discharge Plan: [place] Barriers to Discharge: Continued Medical Work up  Expected Discharge Plan and Services Expected Discharge Plan: [place]   Discharge Planning Services: CM Consult Post Acute Care Choice: [place] Living arrangements for the past 2 months: Single Family Home                                       Social Determinants of Health (SDOH) Interventions    Readmission Risk Interventions No flowsheet data found.

## 2019-05-25 NOTE — [place] FL2 (Addendum)
[place] MEDICAID FL2 LEVEL OF CARE SCREENING TOOL     IDENTIFICATION  Patient Name: Kelly Richardson Birthdate: [birth year] Sex: female Admission Date (Current Location): 05/22/2019  [facility] and [place] Number:  Producer, television/film/video and Address:  [facility],  [address], [place] [address]      Provider Number: [id number]  Attending Physician Name and Address:  Burnadette Pop, MD  Relative Name and Phone Number:  Waverly Ferrari [phone]    Current Level of Care: Hospital Recommended Level of Care: Skilled Nursing Facility Prior Approval Number:    Date Approved/Denied:   PASRR Number:  [id number]  Discharge Plan: SNF    Current Diagnoses: Patient Active Problem List   Diagnosis Date Noted  . Humerus fracture 05/23/2019  . Closed lumbar vertebral fracture (HCC) 05/23/2019  . Leukocytosis 05/23/2019  . Hyperglycemia 05/23/2019  . Fall 05/23/2019  . Health care maintenance 04/09/2015  . H/O total hip arthroplasty 11/09/2014  . Osteomyelitis of right hip (HCC) 07/12/2014  . Chest pain 06/22/2014  . Anemia due to blood loss 06/20/2014  . Status post revision of total hip replacement 06/08/2014  . Anxiety state 03/05/2008  . FATIGUE 03/05/2008  . Uncontrolled insulin dependent diabetes mellitus 01/02/2008  . Hyperlipidemia 01/02/2008  . HTN (hypertension) 01/02/2008    Orientation RESPIRATION BLADDER Height & Weight     Self, Time, Situation, Place  Normal Continent Weight: 97.7 kg Height:  5\' 2"  (157.5 cm)  BEHAVIORAL SYMPTOMS/MOOD NEUROLOGICAL BOWEL NUTRITION STATUS      Continent Diet(reg)  AMBULATORY STATUS COMMUNICATION OF NEEDS Skin   Limited Assist Verbally                         Personal Care Assistance Level of Assistance  Bathing, Feeding, Dressing Bathing Assistance: Limited assistance Feeding assistance: Limited assistance Dressing Assistance: Limited assistance     Functional Limitations Info  Sight, Hearing,  Speech Sight Info: Adequate Hearing Info: Adequate Speech Info: Adequate    SPECIAL CARE FACTORS FREQUENCY  PT (By licensed PT), OT (By licensed OT)     PT Frequency: 5w week OT Frequency: 5x week            Contractures      Additional Factors Info  Code Status, Allergies Code Status Info: full code Allergies Info: Metformin, Penicillins, Seldane Terfenadine, Sulfonamide Derivatives, Vicks Formula 44 Cough Relief Dextromethorphan Hbr, Xanax Alprazolam, Iohexol, Latex, Other, Percocet Oxycodone-acetaminophen           Current Medications (05/25/2019):  This is the current hospital active medication list Current Facility-Administered Medications  Medication Dose Route Frequency Provider Last Rate Last Dose  . acetaminophen (TYLENOL) tablet 650 mg  650 mg Oral Q6H PRN 05/27/2019, MD       Or  . acetaminophen (TYLENOL) suppository 650 mg  650 mg Rectal Q6H PRN John Giovanni, MD      . aspirin chewable tablet 81 mg  81 mg Oral QPM John Giovanni, MD   81 mg at 05/24/19 1826  . atorvastatin (LIPITOR) tablet 20 mg  20 mg Oral QPM 05/26/19, MD   20 mg at 05/24/19 1826  . diphenhydrAMINE (BENADRYL) capsule 25 mg  25 mg Oral Q4H PRN 05/26/19, PA-C   25 mg at 05/23/19 2149  . heparin injection 5,000 Units  5,000 Units Subcutaneous Q8H 2150, MD   5,000 Units at 05/25/19 0538  . insulin aspart (novoLOG) injection 0-5 Units  0-5 Units Subcutaneous QHS Shela Leff, MD   3 Units at 05/24/19 2129  . insulin aspart (novoLOG) injection 0-9 Units  0-9 Units Subcutaneous TID WC Shela Leff, MD   2 Units at 05/25/19 1209  . insulin aspart (novoLOG) injection 6 Units  6 Units Subcutaneous TID WC Shelly Coss, MD   6 Units at 05/25/19 1209  . insulin detemir (LEVEMIR) injection 35 Units  35 Units Subcutaneous QHS Shelly Coss, MD   35 Units at 05/24/19 2129  . ketorolac (TORADOL) 15 MG/ML injection 15 mg  15 mg Intravenous Q6H PRN  Shela Leff, MD   15 mg at 05/25/19 0914  . lisinopril (ZESTRIL) tablet 2.5 mg  2.5 mg Oral QPM Shela Leff, MD   2.5 mg at 05/24/19 1826  . morphine 2 MG/ML injection 2 mg  2 mg Intravenous Q4H PRN Shelly Coss, MD   2 mg at 05/25/19 0930  . ondansetron (ZOFRAN) injection 4 mg  4 mg Intravenous Q8H PRN Shelly Coss, MD   4 mg at 05/23/19 1126     Discharge Medications: Please see discharge summary for a list of discharge medications.  Relevant Imaging Results:  Relevant Lab Results:   Additional Information ss#[id number]  Patric Buckhalter, Juliann Pulse, [place]

## 2019-05-25 NOTE — TOC Progression Note (Signed)
Transition of Care [facility]) - Progression Note    Patient Details  Name: Kelly Richardson MRN: [id number] Date of Birth: March 09, 1951  Transition of Care [facility]) CM/SW Contact  Marcellas Marchant, Olegario Messier, RN Phone Number: 05/25/2019, 2:22 PM  Clinical Narrative:  05/25/2019 Medicare Nursing Home Results [email or web address].it, [id number]&sort=19ASC&paging=114 1/4 Close window 14 hospitals within 25 miles from the center of [place], [place]. Nursing Home Search Results Results List Table Nursing Home Information Overall Rating Health Inpections Staffing Quality Ratings [place] Henrene Dodge [address] [place], [place] [address] [phone] Average Below Average Above Average Above Average [facility] [address] [place], [place] [address] [phone] Much Above Average Above Average Much Above Average Above Average [facility] [address] [place], [place] [address] [phone] Average Below Average Below Average Much Above Average [facility] [place] [address] [place], [place] [address] [phone] Average Average Average Above Average 3 out of 5 stars 2 out of 5 stars 4 out of 5 stars 4 out of 5 stars 5 out of 5 stars 4 out of 5 stars 5 out of 5 stars 4 out of 5 stars 3 out of 5 stars 2 out of 5 stars 2 out of 5 stars 5 out of 5 stars 3 out of 5 stars 3 out of 5 stars 3 out of 5 stars 4 out of 5 stars 05/25/2019 Medicare Nursing Home Results [email or web address].it, [id number]&sort=19ASC&paging=114 2/4 Nursing Home Information Overall Rating Health Inpections Staffing Quality Ratings [facility] [place] [address] [place], [place] [address] [phone] Average Average  Below Average Below Average [facility] [address] [place], [place] [address] [phone] Much Above Average Above Average Not Available12 Much Above Average [facility] [address] [place], [place] [address] [phone] Much Above Average Above Average Much Above Average Much Above Average [facility] [address] [place], [place] [address] [phone] Much Below Average Much Below Average Much Below Average Above Average [facility] [address] [place], [place] [address] [phone] Much Below Average Much Below Average Below Average Below Average 3 out of 5 stars 3 out of 5 stars 2 out of 5 stars 2 out of 5 stars 5 out of 5 stars 4 out of 5 stars 5 out of 5 stars 5 out of 5 stars 4 out of 5 stars 5 out of 5 stars 5 out of 5 stars 1 out of 5 stars 1 out of 5 stars 1 out of 5 stars 4 out of 5 stars 1 out of 5 stars 1 out of 5 stars 2 out of 5 stars 2 out of 5 stars 05/25/2019 Medicare Nursing Home Results [email or web address].it, [id number]&sort=19ASC&paging=114 3/4 Nursing Home Information Overall Rating Health Inpections Staffing Quality Ratings [facility] [place] [place] [address] [place], [place] [address] [phone] Much Above Average Much Above Average Not Available12 Below Average [facility] [address] [place], [place] [address] [phone] Below Average Below Average Below Average Average [facility] [address] [place], [place] [address] [phone] Below Average Below Average Below Average Average [facility] [place] [facility] [address] [place], [place] [address] [phone] Below Average  Below Average Below Average Below Average [facility] [place] [address] [place], [place] [address] [phone] Much Below Average Below  Average Much Below Average Average 5 out of 5 stars 5 out of 5 stars 2 out of 5 stars 2 out of 5 stars 2 out of 5 stars 2 out of 5 stars 3 out of 5 stars 2 out of 5 stars 2 out of 5 stars 2 out of 5 stars 3 out of 5 stars 2 out of 5 stars 2 out of 5 stars 2 out of 5 stars 2 out of 5 stars 1 out of 5 stars 2 out of 5 stars 1 out of 5 stars 3 out of 5 stars 05/25/2019 Medicare Nursing Home Results [email or web address]/, [id number]&paging=114 4/4 NursingHomeCompare Footnotes Footnote number Footnote as displayed on nursing home Compare 1 Newly certified nursing home with less than 12-15 months of data available or the nursing opened less than 6 months ago, and there were no data to submit or claims for this measure. 2 Not enough data available to calculate a star rating. 6 This facility did not submit staffing data, or submitted data that did not meet the criteria required to calculate a staffing measure. 7 CMS determined that the percentage was not accurate or data suppressed by CMS for one or more quarters. 9 The number of residents or resident stays is too small to report. Call the facility to discuss this quality measure. 10 The data for this measure is missing or was not submitted. Call the facility to discuss this quality measure. 12 This facility either did not submit staffing data, has reported a high number of days without a registered nurse onsite, or submitted data that could not be verified through an audit. 13 Results are based on a shorter time period than required. 14 This nursing home is not required to submit data for the [place] Reporting Program. 18 This facility is not rated  due to a history of serious quality issues and is included in the special focus facility program.  05/25/2019 Medicare Nursing Home Results [email or web address] 1/12 Close window 59 hospitals within 50 miles from the center of [address]. Nursing Home Search Results Results List Table Nursing Home Information Overall Rating Health Inpections Staffing Quality Ratings [place], [place] [address] [phone] Average Average Below Average Below Average 2.89 [facility] [address] [place] [address] [phone] Average Average Below Average Average 17.57 Miranda [address] [place] [address] [phone] Average Average Below Average Above Average 19.02 Miles 3 out of 5 stars 3 out of 5 stars 2 out of 5 stars 2 out of 5 stars 3 out of 5 stars 3 out of 5 stars 2 out of 5 stars 3 out of 5 stars 3 out of 5 stars 3 out of 5 stars 2 out of 5 stars 4 out of 5 stars 05/25/2019 Medicare Nursing Home Results [email or web address] 2/12 Nursing Home Information Overall Rating Health Inpections Staffing Quality Ratings [place] [address] [phone] Average Average Average Above Average 19.95 [place] [address] [phone] Average Below Average Above Average Average 20.91 [facility], [place] [address] [phone] Much Above Average Much Above Average Average Average 05.39 [place], [place] [address] [phone] Below Average Below Average Below Average Below Average 22.50 [facility] [place]  [address] [place] Rough [address] [place], [place] [address] [phone] Average Below Average Above Average Above Average 24.53 [facility] [address] [place], [place] [address] [phone] Average Below Average Below Average Much Above Average 24.62 Miles 3 out of 5 stars 3 out of 5 stars 3 out of 5 stars 4 out of 5 stars 3 out of 5 stars 2 out of 5 stars 4 out of 5 stars 3 out of 5 stars 5 out of 5 stars 5 out of 5 stars 3 out of 5 stars 3 out of 5 stars 2 out of 5 stars 2 out of 5 stars 2 out of 5 stars 2 out of 5 stars 3 out of 5 stars 2 out of 5 stars 4 out of 5 stars 4 out of 5 stars 3 out of 5 stars 2 out of 5 stars 2 out of 5 stars 5 out of 5 stars 05/25/2019 Medicare Nursing Home Results [email or web address] 3/12 Nursing Home Information Overall Rating Health Inpections Staffing Quality Ratings [facility] [address] [place], [place] [address] [phone] Much Below Average Much Below Average Above Average Much Below Average 25.23 [facility] [address] [place], [place] [address] [phone] Average Above Average Much Below Average Average 25.37 [facility] [address] [place], [place] [address] [phone] Much Above Average Above Average Much Above Average Above Average 26.[address] RESOURCES - [place], INC [address] [place], [place] [address] [phone] Average Average Average Above Average 26.56 [facility] [address] [place], [place] [address] [phone] Much  Above Average Above Average Not Available12 Much Above Average 28.15 [facility] [address] [place], [place] [address] [phone] Much Above Average Above Average Much Above Average Much Above Average 28.61 Miles 1 out of 5 stars 1 out of 5 stars 4 out of 5 stars 1 out of 5 stars 3 out of 5 stars 4 out of 5 stars 1 out of 5 stars 3 out of 5 stars 5 out of 5 stars 4 out of 5 stars 5 out of 5 stars 4 out of 5 stars 3 out of 5 stars 3 out of 5 stars 3 out of 5 stars 4 out of 5 stars 5 out of 5 stars 4 out of 5 stars 5 out of 5 stars 5 out of 5 stars 4 out of 5 stars 5 out of 5 stars 5 out of 5 stars 05/25/2019 Medicare Nursing Home Results [email or web address] 4/12 Nursing Home Information Overall Rating Health Inpections Staffing Quality Ratings Distanc [facility]  [address] [place], [place] [address] [phone] Average Average Below Average Below Average 28.68 [facility] [address] [place], [place] [address] [phone] Much Below Average Much Below Average Much Below Average Above Average 30.30 [facility] [address] [place], [place] [address] [phone] Above Average Above Average Average Above Average 30.65 [facility] [address] [place], [place] [address] [phone] Average Average Not Available12 Average [address] [facility] [employer], INC [address] [place], [place] [address] [phone] Below Average Below Average Average Above Average 33.32 [facility] [address] [place], [place] [address] [phone] Much Below Average Much Below Average Below Average Below Average 33.81 Miles 3  out of 5 stars 3 out of 5 stars 2 out of 5 stars 2 out of 5 stars 1 out of 5 stars 1 out of 5 stars 1 out of 5 stars 4 out of 5 stars 4 out of 5 stars 4 out of 5 stars 3 out of 5 stars 4 out of 5 stars 3 out of 5 stars 3 out of 5 stars 3 out of 5 stars 2 out of 5 stars 2 out of 5 stars 3 out of 5 stars 4 out of 5 stars 1 out of 5 stars 1 out of 5 stars 2 out of 5 stars 2 out of 5 stars 05/25/2019 Medicare Nursing Home Results [email or web address] 5/12 Nursing Home Information Overall Rating Health Inpections Staffing Quality Ratings Distanc [facility] [address] [place], [place] [address] [phone] Much Above Average Above Average Below Average Much Above Average 36.97 [facility] E[facility]  [address] [place], [place] [address] [phone] Much Above Average Much Above Average Not Available12 Below Average 38.17 [facility] [address] [place], [place] [address] [phone] Much Below Average Below Average Much Below Average Average 38.86 [facility] [place], [place] [address] [place], [place] [address] [phone] Much Below Average Below Average Much Below Average Average 39.85 [facility] [address] [place], [place] [address] [phone] Much Below Average Much Below Average Below Average Below Average 40.23 Miles 5 out of 5 stars 4 out of 5 stars 2 out of 5 stars 5 out of 5 stars 5 out of 5 stars 5 out of 5 stars 2 out of 5 stars 1 out of 5 stars 2 out of 5 stars 1 out of 5 stars 3 out of 5 stars 1 out of 5 stars 2 out of 5 stars 1 out of 5 stars 3 out of 5 stars 1 out of 5 stars 1 out of 5 stars 2 out of 5 stars 2 out of 5 stars 05/25/2019 Medicare  Nursing Home Results [email or web address] 6/12 Nursing Home Information Overall Rating Health Inpections Staffing Quality Ratings Distanc [facility] [address] [place], [place] [address] [phone] Below Average Below Average Below Average Above Average 40.38 [facility] [place] [facility] [address] [place], [place] [address] [phone] Below Average Below Average Below Average Below Average [address] [facility] [place], [place] [address] [phone] Above Average Above Average Above Average Above Average 40.60 [facility] [address] [place], [place] [address] [phone] Below Average Below Average Below Average Average 41.28 [facility] [address] [place], [place] [address] [phone] Much Below Average Much Below Average Below Average Average 41.69 [facility] [address] [place], [place] [address] [phone] Much Above Average Average Much Above Average Much Above Average 41.72 Miles 2 out of 5 stars 2 out of 5 stars 2 out of 5 stars 4 out of 5 stars 2 out of 5 stars 2 out of 5 stars 2 out of 5 stars 2 out of 5 stars 4 out of 5 stars 4 out of 5 stars 4 out of 5 stars 4 out of 5 stars 2 out of 5 stars 2 out of 5 stars 2 out of 5 stars 3 out of 5 stars 1 out of 5 stars 1 out  of 5 stars 2 out of 5 stars 3 out of 5 stars 5 out of 5 stars 3 out of 5 stars 5 out of 5 stars 5 out of 5 stars 05/25/2019 Medicare Nursing Home Results [email or web address] 7/12 Nursing Home Information Overall Rating Health Inpections Staffing Quality Ratings  Distanc [facility] [address] [place], [place] [address] [phone] Much Below Average Much Below Average Below Average Average 41.75 [facility] [address] [place], [place] [address] [phone] Average Below Average Below Average Much Above Average 42.13 [facility] [address] [place], [place] [address] [phone] Below Average Below Average Below Average Below Average 42.15 [facility] [address] [place], [place] [address] [phone] Above Average Above Average Above Average Average 42.21 [facility] [address] [place], [place] [address] [phone] Above Average Average Average Much Above Average 42.21 Miles 1 out of 5 stars 1 out of 5 stars 2 out of 5 stars 3 out of 5 stars 3 out of 5 stars 2 out of 5 stars 2 out of 5 stars 5 out of 5 stars 2 out of 5 stars 2 out of 5 stars 2 out of 5 stars 2 out of 5 stars 4 out of 5 stars 4 out of 5 stars 4 out of 5 stars 3 out of 5 stars 4 out of 5 stars 3 out of 5 stars 3 out of 5 stars 5 out of 5 stars 05/25/2019 Medicare Nursing Home Results [email or web address] 8/12 Nursing Home Information Overall Rating Health Inpections Staffing Quality Ratings Distanc [facility] [address] [place], [place] [address] [phone] Much Below Average Much Below Average Average Much Below Average 42.42 [facility] [place] [address] [place], [place] [address] [phone] Average Average Below Average Above Average 42.45 [facility] [address] [place], [place] [address] [phone] Below Average Below Average Below Average Average [address] [place], [place] [address] [phone] Much Below Average Much Below Average Below Average Average 42.61 [facility] [address] [place], [place] [address] [phone] Below Average Below Average Much Below Average Much Above Average 42.99 [facility] [address] [place], [place] [address] [phone] Too New to Rate1 Too New to Rate1 Too New to Rate1 Too New to Rate1 43.61 Marvis Moeller 1 out of 5 stars 1 out of 5 stars 3 out of 5 stars 1 out of 5 stars 3 out of 5 stars 3 out of 5 stars 2 out of 5 stars 4 out of 5 stars 2 out of 5 stars 2 out of 5 stars 2 out of 5 stars 3 out of 5 stars 1 out of 5 stars 1 out of 5 stars 2 out of 5 stars 3 out of 5 stars 2 out of 5 stars 2 out of 5 stars 1 out of 5 stars 5 out of 5 stars 05/25/2019 Medicare Nursing Home Results [email or web address] 9/12 Nursing Home Information Overall Rating Health Inpections Staffing Quality Ratings Distanc [facility] [address] [place], [place] [address] [phone] Below Average Below Average Below Average Below Average 43.88 Bosie Clos [facility] [address] [place], [place] [address] [phone] Much Above Average Above Average Much Above Average Much Above Average 44.31 Starlyn Skeans  [facility] [address] [place], [place] [address] [phone] Much Below Average Much Below Average Below Average Average 44.46 [facility] [address] [place], [place] [address] [phone] Much Above Average Above Average Above Average Much  Above Average 44.55 [facility] [address] [place], [place] [address] [phone] Much Above Average Much Above Average Not Available12 Much Above Average 44.63 [facility] [address] [place], [place] [address] [phone] Much Above Average Much Above Average Not Available12 Much Above Average 44.65 Miles 2 out of 5 stars 2 out of 5 stars 2 out of 5 stars 2 out of 5 stars 5 out of 5 stars 4 out of 5 stars 5 out of 5 stars 5 out of 5 stars 1 out of 5 stars 1 out of 5 stars 2 out of 5 stars 3 out of 5 stars 5 out of 5 stars 4 out of 5 stars 4 out of 5 stars 5 out of 5 stars 5 out of 5 stars 5 out of 5 stars 5 out of 5 stars 5 out of 5 stars 5 out of 5 stars 5 out of 5 stars 05/25/2019 Medicare Nursing Home Results [email or web address]://[email or web address]/nursinghomecompare/resultsprint.html?[email or web address]&sort=19ASC&paging=159 10/12 Nursing Home Information Overall Rating Health Inpections Staffing Quality Ratings Distanc [facility] [address] [place], [place] [address] [phone] Above Average Average Below Average Much Above Average 44.79 [facility] [address] [place], [place] [address] [phone] Much Below Average Much Below Average Average Much Below Average 44.99 [facility] [address] [place], [place] [address] [phone] Much Above Average Above Average Much Above Average Much Above Average 45.29 [facility] [address] [place] [address] [phone] Much Above Average Much Above Average Not Available12 Average 45.32 [facility] [address] [place], [place] [address] [phone] Average Above Average Below Average Above Average 46.28 [facility] [address] [place], [place] [id number] [phone] Below Average Below Average Below Average Below Average 47.06 Miles 4 out of 5 stars 3 out of 5 stars 2 out of 5 stars 5 out of 5 stars 1 out of 5 stars 1 out of 5 stars 3 out of 5 stars 1 out of 5 stars 5 out of 5 stars 4 out of 5 stars 5 out of 5 stars 5 out of 5 stars 5 out of 5 stars 5 out of 5 stars 3 out of 5 stars 4 out of 5 stars 4 out of 5 stars 2 out of 5 stars 4 out of 5 stars 2 out of 5 stars 2 out of 5 stars 2 out of 5 stars 2 out of 5 stars 05/25/2019 Medicare Nursing Home Results [email or web address]://www.medicare.gov/nursinghomecompare/resultsprint.html?loc=[email or web address]&sort=19ASC&paging=159 11/12 NursingHomeCompare Footnotes Footnote number Footnote as displayed on nursing home Compare 1 Newly certified nursing home with less than 12-15 months of data available or the nursing opened less than 6 months ago, and there were no data to submit or claims for this measure. 2 Not enough data available to calculate a star rating. 6 This facility did not submit staffing data, or submitted data that did not meet the criteria required to calculate a staffing measure. Nursing Home Information Overall Rating Health Inpections Staffing Quality Ratings Distanc [facility] [place] [address] [place], [place] [address] [phone] Much Above  Average Much Above Average Much Above Average Below Average 47.13 [facility] [address] [place], [place] [address] [phone] Much Above Average Average Above Average Much Above Average 49.31 [facility] [address] [place], [place] [address] [phone] Much Above Average Much Above Average Above Average Above Average 49.35 [facility] [address] [place], [place] [address] [phone] Average Average Average Average 49.64 Miles 5 out of 5  stars 5 out of 5 stars 5 out of 5 stars 2 out of 5 stars 5 out of 5 stars 3 out of 5 stars 4 out of 5 stars 5 out of 5 stars 5 out of 5 stars 5 out of 5 stars 4 out of 5 stars 4 out of 5 stars 3 out of 5 stars 3 out of 5 stars 3 out of 5 stars 3 out of 5 stars 05/25/2019 Medicare Nursing Home Results [email or web address] 12/12 Footnote number Footnote as displayed on nursing home Compare 7 CMS determined that the percentage was not accurate or data suppressed by CMS for one or more quarters. 9 The number of residents or resident stays is too small to report. Call the facility to discuss this quality measure. 10 The data for this measure is missing or was not submitted. Call the facility to discuss this quality measure. 12 This facility either did not submit staffing data, has reported a high number of days without a registered nurse onsite, or submitted data that could not be verified through an audit. 13 Results are based on a shorter time period than required. 14 This nursing home is not required to submit data for the Skilled Nursing Facility Quality Reporting Program. 18 This facility is not rated due to a history of serious quality issues and is included in the special focus facility program.    Expected Discharge Plan: Skilled Nursing Facility Barriers to Discharge: Continued Medical Work up  Expected Discharge Plan and Services Expected Discharge Plan: Skilled Nursing Facility   Discharge Planning Services: CM Consult Post Acute Care Choice: Skilled Nursing Facility Living arrangements for the past 2 months: Single Family Home                                       Social Determinants of Health (SDOH) Interventions    Readmission Risk Interventions No flowsheet data found.

## 2019-05-25 NOTE — TOC Progression Note (Signed)
Transition of Care [facility]) - Progression Note    Patient Details  Name: Kelly Richardson MRN: [id number] Date of Birth: [birth year]  Transition of Care [facility]) CM/SW Contact  Ramonda Galyon, Juliann Pulse, RN Phone Number: 05/25/2019, 2:17 PM  Clinical Narrative: 05/25/2019 Medicare Nursing Home Results [email or web address] 1/9 Close window 31 hospitals within 25 miles from the center of [address]. Nursing Home Search Results Results List Table Nursing Home Information Overall Rating Health Inpections Staffing Quality Ratings Distance [facility]onimo Norma Sparks [place], [place] [address] [phone] Below Average Below Average Below Average Below Average 1.62 Torrie Mayers [address] [place], [place] [address] [phone] Much Above Average Above Average Much Above Average Much Above Average 1.77 Miles 2 out of 5 stars 2 out of 5 stars 2 out of 5 stars 2 out of 5 stars 5 out of 5 stars 4 out of 5 stars 5 out of 5 stars 5 out of 5 stars 05/25/2019 Medicare Nursing Home Results [email or web address] 2/9 Nursing Home Information Overall Rating Health Inpections Staffing Quality Ratings Distance [facility] [place] [facility] [place], [place] [address] [phone] Below Average Below Average Below Average Below Average 2.65 Madrone Tennant [place], [place] [address] [phone] Much Below Average Below Average Much Below Average Average 2.72 [facility] [place], [place] [address] [phone] Much Above Average Much  Above Average Not Available12 Much Above Average 3.91 [facility] [address] [place], [place] [address] [phone] Much Below Average Much Below Average Below Average Below Average 4.01 Miles 2 out of 5 stars 2 out of 5 stars 2 out of 5 stars 2 out of 5 stars 1 out of 5 stars 2 out of 5 stars 1 out of 5 stars 3 out of 5 stars 5 out of 5 stars 5 out of 5 stars 5 out of 5 stars 1 out of 5 stars 1 out of 5 stars 2 out of 5 stars 2 out of 5 stars 05/25/2019 Medicare Nursing Home Results [email or web address] 3/9 Nursing Home Information Overall Rating Health Inpections Staffing Quality Ratings Distance [place] [address] [place], [place] [address] [phone] Much Below Average Much Below Average Average Much Below Average 4.42 Ama [place], [place] [address] [phone] Below Average Below Average Below Average Below Average 4.63 [place], [place] [address] [phone] Much Above Average Above Average Much Above Average Much Above Average 4.94 [facility] [address] [place], [place] [address] [phone] Below Average Below Average Below Average Average 5.19 Miles 1 out of 5 stars 1 out of 5 stars 3 out of 5 stars 1 out of 5 stars 2 out of 5 stars 2 out of 5 stars 2 out of 5 stars 2 out of 5 stars 5 out of 5 stars 4 out of 5 stars 5 out of 5 stars 5 out of 5 stars 2 out of 5 stars 2 out of 5 stars 2 out of 5 stars 3 out of 5 stars 05/25/2019 Medicare Nursing Home Results [email or web address] 4/9 [place],  [address] [phone] Much Above Average Much Above Average  Not Available12 Below Average 5.33 [facility] [address] [place], [place] [address] [phone] Below Average Below Average Below Average Average 6.33 [facility] [address] [place], [place] [address] [phone] Average Average Average Average 7.11 [facility] [address] [place], [place] [address] [phone] Above Average Above Average Below Average Above Average 10.82 Miles 5 out of 5 stars 5 out of 5 stars 2 out of 5 stars 2 out of 5 stars 2 out of 5 stars 2 out of 5 stars 3 out of 5 stars 3 out of 5 stars 3 out of 5 stars 3 out of 5 stars 3 out of 5 stars 4 out of 5 stars 4 out of 5 stars 2 out of 5 stars 4 out of 5 stars 05/25/2019 Medicare Nursing Home Results [email or web address] 5/9 Nursing Home Information Overall Rating Health Inpections Staffing Quality Ratings Distance [facility] [address] [facility] [place], [place] [address] [phone] Much Below Average Much Below Average Below Average Below Average 11.16 [facility] [place] [address] [place], [place] [address] [phone] Below Average Below Average Below Average Below Average 11.29 [facility] [address] [place], [place] [address] [phone] Much Above Average Much Above Average Not Available12 Much Above Average 11.87 [facility] AT [facility] R[facility]GE [address] [place], [place] [address] [phone] Much Above Average Much Above Average Above Average Much Above Average 13.30 [facility] 1 out of 5 stars 1 out of 5 stars 2 out of 5 stars 2 out of 5 stars 2 out of 5 stars 2 out of 5 stars 2 out of 5 stars 2 out of 5 stars 5 out of 5 stars 5 out of 5 stars 5 out of 5 stars 5 out of 5 stars 5 out of 5 stars 4 out of 5 stars 5 out of 5 stars 05/25/2019 Medicare Nursing Home Results [email or web address] 6/9 Nursing Home Information Overall Rating Health Inpections Staffing Quality Ratings Distance [facility] [address] [place], [place] [address] [phone] Much Below Average Much Below Average Much Below Average Average 15.65 [facility] [address] [place], [place] [address] [phone] Much Below Average Much Below Average Below Average Below Average 16.26 [facility] [address] [place], [place] [address] [phone] Much Below Average Much Below Average Not Available12 Average 16.42 [facility] AND REHABILITA [address] [place], [place] [address] [phone] Much Below Average Much Below Average Below Average Average 16.54 [facility] [address] [place] [address] [place], [place] [address] [phone] Above Average Average Below Average Much Above Average 16.97 [facility] 1 out of 5 stars 1 out of 5 stars 1 out of 5 stars 3 out of 5 stars 1 out of 5 stars 1 out of 5 stars 2 out of 5 stars 2 out of 5 stars 1 out of 5 stars 1 out of 5 stars 3 out of 5 stars 1 out of 5 stars 1 out of 5 stars 2 out of 5 stars 3 out of 5 stars 4 out of 5 stars 3 out of 5 stars 2 out of 5 stars 5 out of 5 stars 05/25/2019 Medicare Nursing Home  Results [email or web address] 7/9 Nursing Home Information Overall Rating Health Inpections Staffing Quality Ratings Distance [facility] [address]  [place], [place] [address] [phone] Much Below Average Much Below Average Average Average 17.95 [facility] AT [facility] PLACE [address] [place], [place] [address] [phone] Average Average Below Average Average 18.35 [facility] & [facility] [place] [address] [place], [place] [address] [phone] Below Average Below Average Below Average Below Average 18.54 [facility] [address] [place], [place] [address] [phone] Much Above Average Above Average Much Above Average Much Above Average 20.96 Miles 1 out of 5 stars 1 out of 5 stars 3 out of 5 stars 3 out of 5 stars 3 out of 5 stars 3 out of 5 stars 2 out of 5 stars 3 out of 5 stars 2 out of 5 stars 2 out of 5 stars 2 out of 5 stars 2 out of 5 stars 5 out of 5 stars 4 out of 5 stars 5 out of 5 stars 5 out of 5 stars 05/25/2019 Medicare Nursing Home Results [email or web address] 8/9 Nursing Home Information Overall Rating Health Inpections Staffing Quality Ratings Distance [facility] [address] [place], [place] [address] [phone] Much Above Average Above Average Not Available12 Much Above Average 21.22 [facility] [place] [address] [place], [place] [address] [phone] Average Average Below Average Below Average 22.39 [facility] [address] [place], [place] [address] [phone] Much Below Average Much  Below Average Much Below Average Below Average 22.95 [facility] [address] [place], [place] [address] [phone] Much Below Average Much Below Average Average Below Average 24.73 [facility] NursingHomeCompare Footnotes 5 out of 5 stars 4 out of 5 stars 5 out of 5 stars 3 out of 5 stars 3 out of 5 stars 2 out of 5 stars 2 out of 5 stars 1 out of 5 stars 1 out of 5 stars 1 out of 5 stars 2 out of 5 stars 1 out of 5 stars 1 out of 5 stars 3 out of 5 stars 2 out of 5 stars 05/25/2019 Medicare Nursing Home Results [email or web address] 9/9 Footnote number Footnote as displayed on nursing home Compare Footnote number Footnote as displayed on nursing home Compare 1 Newly certified nursing home with less than 12-15 months of data available or the nursing opened less than 6 months ago, and there were no data to submit or claims for this measure. 2 Not enough data available to calculate a star rating. 6 This facility did not submit staffing data, or submitted data that did not meet the criteria required to calculate a staffing measure. 7 CMS determined that the percentage was not accurate or data suppressed by CMS for one or more quarters. 9 The number of residents or resident stays is too small to report. Call the facility to discuss this quality measure. 10 The data for this measure is missing or was not submitted. Call the facility to discuss this quality measure. 12 This facility either did not submit staffing data, has reported a high number of days without a registered nurse onsite, or submitted data that could not be verified through an audit. 13 Results are based on a shorter time period than required. 14 This nursing home is not required to submit data for the Skilled Nursing Facility Quality Reporting  Program. 18 This facility is not rated due to a history of serious quality issues and is included in the special focus facility program.      Expected Discharge  Plan: Skilled Nursing Facility Barriers to Discharge: Continued Medical Work up  Expected Discharge Plan and Services Expected Discharge Plan: Skilled Nursing Facility   Discharge Planning Services: CM Consult Post Acute Care Choice: Skilled Nursing Facility Living arrangements for the past 2 months: Single Family Home                                       Social Determinants of Health (SDOH) Interventions    Readmission Risk Interventions No flowsheet data found.

## 2019-05-25 NOTE — Evaluation (Signed)
Occupational Therapy Evaluation Patient Details Name: Kelly Richardson J Albino MRN: [id number] DOB: 11-10-1950 Today's Date: 05/25/2019    History of Present Illness 68 year old female with history of arthritis, insulin-dependent diabetes, hypertension, seizures who presented to the emergency department after she fell at home and lying on the floor for prolonged time.  She was seen in the emergency department on 11/12 after a mechanical fall and found to have acute minimally displaced comminuted fracture of the left humeral head and neck and acute L2 inferior endplate compression fracture with minimal height loss.  Orthopedics and neurosurgery were consulted at the time and they recommended to follow her as an outpatient.  After she was discharged home, she slipped on a wet surface on the driveway and fell.   Clinical Impression   Impression  Pt admitted with above diagnosis. Mod assist for bed mobility, min assist to stand and pivot to recliner. She is not safe to return home as she is high falls risk and lacks 24* care there (her husband works nights and sleeps during the day).  Pt currently with functional limitations due to the deficits listed below (see OT Problem List).  Pt will benefit from skilled OT to increase their safety and independence with ADL and functional mobility for ADL to facilitate discharge to venue listed below.         Follow Up Recommendations  SNF    Equipment Recommendations  None recommended by OT    Recommendations for Other Services       Precautions / Restrictions Precautions Precautions: Fall;Back Precaution Comments: 2 recent falls Required Braces or Orthoses: Sling;Spinal Brace Spinal Brace: Thoracolumbosacral orthotic(pt has a TLSO, however it is very heavily soiled, ortho tech aware and to investigate options) Restrictions Weight Bearing Restrictions: Yes Other Position/Activity Restrictions: LUE NWB      Mobility Bed Mobility Overal bed mobility:  Needs Assistance Bed Mobility: Rolling;Sidelying to Sit Rolling: Mod assist Sidelying to sit: Mod assist       General bed mobility comments: assist to initiate roll and to raise trunk, pt fearful of movement, very focused on LUE pain  Transfers Overall transfer level: Needs assistance   Transfers: Sit to/from Stand;Stand Pivot Transfers Sit to Stand: Min assist;+2 safety/equipment Stand pivot transfers: Min assist;+2 safety/equipment       General transfer comment: assist to rise/steady, no loss of balance/buckling, activity tolerance limited by LUE pain/fatigue    Balance Overall balance assessment: Needs assistance Sitting-balance support: Feet supported;Single extremity supported       Standing balance support: Single extremity supported Standing balance-Leahy Scale: Poor Standing balance comment: min single UE support                           ADL either performed or assessed with clinical judgement   ADL Overall ADL's : Needs assistance/impaired Eating/Feeding: Minimal assistance;Sitting   Grooming: Minimal assistance;Sitting   Upper Body Bathing: Maximal assistance;Sitting   Lower Body Bathing: +2 for safety/equipment;+2 for physical assistance;Total assistance   Upper Body Dressing : Maximal assistance;Sitting   Lower Body Dressing: +2 for physical assistance;+2 for safety/equipment;Moderate assistance   Toilet Transfer: Moderate assistance;+2 for physical assistance Toilet Transfer Details (indicate cue type and reason): bed to chair Toileting- Clothing Manipulation and Hygiene: Sit to/from stand;Cueing for safety;+2 for safety/equipment;With caregiver independent assisting;Maximal assistance         General ADL Comments: pt agreeable to get OOB.  Pt initially screamed inpain when OT touches her LUE but  did better with increased time. Sling replaced as hers was soiled.  Her back brace also soiled. RN aware and calleding MD     Vision          Perception     Praxis      Pertinent Vitals/Pain Pain Assessment: 0-10 Pain Score: 10-Worst pain ever Pain Location: LUE Pain Descriptors / Indicators: Throbbing Pain Intervention(s): Limited activity within patient's tolerance;Monitored during session;Premedicated before session;Repositioned     Hand Dominance     Extremity/Trunk Assessment Upper Extremity Assessment Upper Extremity Assessment: LUE deficits/detail   Lower Extremity Assessment Lower Extremity Assessment: Overall WFL for tasks assessed   Cervical / Trunk Assessment Cervical / Trunk Assessment: Normal   Communication Communication Communication: No difficulties   Cognition Arousal/Alertness: Awake/alert Behavior During Therapy: Anxious Overall Cognitive Status: No family/caregiver present to determine baseline cognitive functioning                                 General Comments: pt had some memory deficits, she asked why she was in the hospital, couldn't recall if she'd fallen at home or not PTA   General Comments       Exercises     Shoulder Instructions      Home Living Family/patient expects to be discharged to:: Private residence Living Arrangements: Spouse/significant other     Home Access: Stairs to enter Secretary/administrator of Steps: 4 Entrance Stairs-Rails: Right Home Layout: One level     Bathroom Shower/Tub: Chief Strategy Officer: Standard         Additional Comments: husband works nights, pt can't remember if she has DME      Prior Functioning/Environment Level of Independence: Independent                 OT Problem List: Decreased strength;Decreased range of motion;Impaired balance (sitting and/or standing);Decreased safety awareness;Decreased knowledge of use of DME or AE;Decreased knowledge of precautions;Obesity;Pain;Impaired UE functional use      OT Treatment/Interventions: Self-care/ADL training;DME and/or AE  instruction;Therapeutic activities;Patient/family education    OT Goals(Current goals can be found in the care plan section) Acute Rehab OT Goals Patient Stated Goal: likes to watch tv ADL Goals Pt Will Perform Grooming: with min assist;standing Pt Will Perform Lower Body Dressing: with min assist;sit to/from stand Pt Will Transfer to Toilet: bedside commode;with min guard assist Pt Will Perform Toileting - Clothing Manipulation and hygiene: sit to/from stand;with min guard assist;sitting/lateral leans  OT Frequency: Min 2X/week   Barriers to D/C: Decreased caregiver support          Co-evaluation   Reason for Co-Treatment: Complexity of the patient's impairments (multi-system involvement);Necessary to address cognition/behavior during functional activity;For patient/therapist safety;To address functional/ADL transfers PT goals addressed during session: Mobility/safety with mobility;Balance        AM-PAC OT "6 Clicks" Daily Activity     Outcome Measure Help from another person eating meals?: A Little Help from another person taking care of personal grooming?: A Lot Help from another person toileting, which includes using toliet, bedpan, or urinal?: Total Help from another person bathing (including washing, rinsing, drying)?: Total Help from another person to put on and taking off regular upper body clothing?: Total Help from another person to put on and taking off regular lower body clothing?: Total 6 Click Score: 9   End of Session Equipment Utilized During Treatment: Gait belt;Other (comment)(sling) Nurse Communication: Mobility status  Activity Tolerance: Patient tolerated treatment well Patient left: in chair;with call bell/phone within reach;with nursing/sitter in room  OT Visit Diagnosis: Unsteadiness on feet (R26.81);Other abnormalities of gait and mobility (R26.89);Repeated falls (R29.6);Muscle weakness (generalized) (M62.81);History of falling (Z91.81);Pain Pain -  Right/Left: Left Pain - part of body: Shoulder                Time: 6503-5465 OT Time Calculation (min): 29 min Charges:  OT General Charges $OT Visit: 1 Visit OT Evaluation $OT Eval Moderate Complexity: 1 Mod  Kari Baars, [place] [phone] Office- [place], Edwena Felty D 05/25/2019, 1:45 PM

## 2019-05-25 NOTE — TOC Transition Note (Signed)
Transition of Care [facility] - CM/SW Discharge Note   Patient Details  Name: Kelly Richardson MRN: [id number] Date of Birth: 08/03/1950  Transition [facility] Care [facility]) CM/SW Contact:  Dessa Phi, RN Phone Number: 05/25/2019, 6:35 PM   Clinical Narrative:Patient has a bed @ [facility] tomorrow for d/c-MD aware.       Final next level of care: [place] Barriers to Discharge: Continued Medical Work up   Patient Goals and CMS Choice Patient states their goals for this hospitalization and ongoing recovery are:: go to rehab      Discharge Placement              Patient [facility] bed at: [facility]   Discharge Planning Services: CM Consult Post Acute Care Choice: [place]                               Social Determinants of Health (SDOH) Interventions     Readmission Risk Interventions No flowsheet data found.

## 2019-05-25 NOTE — Progress Notes (Signed)
PROGRESS NOTE    Kelly Richardson  UYQ:[id number] DOB: 1950-10-11 DOA: 05/22/2019 PCP: Berkley Harvey, NP   Brief Narrative:  Patient is a 68 year old female with history of arthritis, insulin-dependent diabetes, hypertension, seizures who presented to the emergency department after she fell at home and lying on the floor for prolonged time.  She was seen in the emergency department on 11/12 after a mechanical fall and found to have acute minimally displaced comminuted fracture of the left humeral head and neck and acute L2 inferior endplate compression fracture with minimal height loss.  Orthopedics and neurosurgery were consulted at the time and they recommended to follow her as an outpatient.  After she was discharged home, she slipped on a wet surface on the driveway and fell.  Patient admitted for the evaluation of PT/OT. Patient Is medically stable for discharge to skilled nursing facility Vs home with home health.  We are waiting for PT/OT evaluation  Assessment & Plan:   Principal Problem:   Humerus fracture Active Problems:   HTN (hypertension)   Closed lumbar vertebral fracture (HCC)   Leukocytosis   Hyperglycemia   Fall   Left humeral head/neck fracture secondary to mechanical fall: Imaging done on 11/12 showed acute minimally displaced comminuted fracture of the left humeral head/neck.  Orthopedics was consulted at the time and recommended to follow as an outpatient.  She has a sling on her left arm.  PT/OT again consulted,waiting for evaluation.  Continue pain management.  Lumbar compression fracture: Imaging done on 11/12 showed acute L2 inferior endplate compression fracture with minimal height loss.  Neurosurgery was consulted at the time and recommended outpatient follow-up.  TLSO brace recommended.  Pending PT/OT evaluation.    Leukocytosis: Most likely reactive.  UA not impressive for UTI.Chest x-ray did not show pneumonia.  Hyperglycemia/insulin-dependent diabetes  mellitus: Continue current insulin regimen.  Anion gap is negative.  Urine showed ketones and beta hydroxybutyrate acid level was positive.  No need of insulin drip at this time.  Hemoglobin A1c is 10.6. Diabetic coordinator  Following.She  Is on insulin at home.  Hypertension: BP stable. continue home lisinopril  Hyperlipidemia: Continue Lipitor  Microcytic anemia: Iron studies showed low iron.  She does not have any active bleeding.  She was transfused with IV iron. Continue oral supplementation on discharge.           DVT prophylaxis: Heparin [place] Code Status: Full Family Communication: None.  Disposition Plan: Pending PT/OT evaluation.Medically stable for DC   Consultants: None  Procedures: None  Antimicrobials:  Anti-infectives (From admission, onward)   None      Subjective: Patient seen and examined at bedside this morning.  Feels much better today.  Denies any specific complaints.  She is more alert and awake today.  She was sleepy and also complained of hallucination.  We discontinued oxycodone.  Objective: Vitals:   05/24/19 1358 05/24/19 2053 05/25/19 0441 05/25/19 0800  BP: (!) 143/66 (!) 109/51 (!) 100/57 114/70  Pulse: 87 97 94 93  Resp: 16 18 16 18   Temp: 98 F (36.7 C) 98.7 F (37.1 C) 99.3 F (37.4 C) (!) 97.4 F (36.3 C)  TempSrc: Oral Oral Oral Oral  SpO2: 96% 97% 93% 94%  Weight:      Height:        Intake/Output Summary (Last 24 hours) at 05/25/2019 1237 Last data filed at 05/25/2019 0654 Gross per 24 hour  Intake -  Output 950 ml  Net -950 ml  Filed Weights   05/23/19 0424  Weight: 97.7 kg    Examination:  General exam: Not in distress, obese, deconditioned HEENT:PERRL, Ear/Nose normal on gross exam Respiratory system: Bilateral equal air entry, normal vesicular breath sounds, no wheezes or crackles  Cardiovascular system: S1 & S2 heard, RRR. No JVD, murmurs, rubs, gallops or clicks. No pedal edema. Gastrointestinal system:  Abdomen is nondistended, soft and nontender. No organomegaly or masses felt. Normal bowel sounds heard. Central nervous system: Alert and oriented. No focal neurological deficits. Extremities: No edema, no clubbing ,no cyanosis, distal peripheral pulses palpable.  Skin: No rashes, lesions or ulcers,no icterus ,no pallor .     Data Reviewed: I have personally reviewed following labs and imaging studies  CBC: Recent Labs  Lab 05/22/19 2325 05/23/19 0451 05/24/19 0815 05/25/19 0532  WBC 16.0* 16.6* 11.5* 11.8*  NEUTROABS  --   --  7.0 7.3  HGB 12.3 11.8* 9.0* 8.9*  HCT 44.1 43.8 34.1* 32.2*  MCV 71.0* 73.5* 75.4* 72.9*  PLT 363 364 240 291   Basic Metabolic Panel: Recent Labs  Lab 05/22/19 2325 05/23/19 0451  NA 135 135  K 4.6 4.3  CL 101 104  CO2 21* 18*  GLUCOSE 282* 282*  BUN 17 17  CREATININE 0.86 0.84  CALCIUM 9.4 8.9   GFR: Estimated Creatinine Clearance: 72.8 mL/min (by C-G formula based on SCr of 0.84 mg/dL). Liver Function Tests: Recent Labs  Lab 05/22/19 2325  AST 21  ALT 25  ALKPHOS 92  BILITOT 1.1  PROT 8.3*  ALBUMIN 3.8   No results for input(s): LIPASE, AMYLASE in the last 168 hours. No results for input(s): AMMONIA in the last 168 hours. Coagulation Profile: No results for input(s): INR, PROTIME in the last 168 hours. Cardiac Enzymes: No results for input(s): CKTOTAL, CKMB, CKMBINDEX, TROPONINI in the last 168 hours. BNP (last 3 results) No results for input(s): PROBNP in the last 8760 hours. HbA1C: Recent Labs    05/23/19 0451  HGBA1C 10.6*   CBG: Recent Labs  Lab 05/24/19 1157 05/24/19 1625 05/24/19 2053 05/25/19 0757 05/25/19 1135  GLUCAP 118* 168* 281* 162* 177*   Lipid Profile: No results for input(s): CHOL, HDL, LDLCALC, TRIG, CHOLHDL, LDLDIRECT in the last 72 hours. Thyroid Function Tests: No results for input(s): TSH, T4TOTAL, FREET4, T3FREE, THYROIDAB in the last 72 hours. Anemia Panel: Recent Labs    05/25/19 0532   FERRITIN 13  TIBC 322  IRON 26*   Sepsis Labs: No results for input(s): PROCALCITON, LATICACIDVEN in the last 168 hours.  Recent Results (from the past 240 hour(s))  SARS CORONAVIRUS 2 (TAT 6-24 HRS) Nasopharyngeal Nasopharyngeal Swab     Status: None   Collection Time: 05/23/19 12:43 AM   Specimen: Nasopharyngeal Swab  Result Value Ref Range Status   SARS Coronavirus 2 NEGATIVE NEGATIVE Final    Comment: (NOTE) SARS-CoV-2 target nucleic acids are NOT DETECTED. The SARS-CoV-2 RNA is generally detectable in upper and lower respiratory specimens during the acute phase of infection. Negative results do not preclude SARS-CoV-2 infection, do not rule out co-infections with other pathogens, and should not be used as the sole basis for treatment or other patient management decisions. Negative results must be combined with clinical observations, patient history, and epidemiological information. The expected result is Negative. Fact Sheet for Patients: HairSlick.no Fact Sheet for Healthcare Providers: quierodirigir.com This test is not yet approved or cleared by the [place] FDA and  has been authorized for detection and/or diagnosis  of SARS-CoV-2 by FDA under an Emergency Use Authorization (EUA). This EUA will remain  in effect (meaning this test can be used) for the duration of the COVID-19 declaration under Section 56 4(b)(1) of the Act, 21 U.S.C. section 360bbb-3(b)(1), unless the authorization is terminated or revoked sooner. Performed at [facility], [address] [place], [place] [address]          Radiology Studies: No results found.      Scheduled Meds: . aspirin  81 mg Oral QPM  . atorvastatin  20 mg Oral QPM  . heparin  5,000 Units Subcutaneous Q8H  . insulin aspart  0-5 Units Subcutaneous QHS  . insulin aspart  0-9 Units Subcutaneous TID WC  . insulin aspart  6 Units Subcutaneous TID WC   . insulin detemir  35 Units Subcutaneous QHS  . lisinopril  2.5 mg Oral QPM   Continuous Infusions:    LOS: 2 days    Time spent:25 mins. More than 50% of that time was spent in counseling and/or coordination of care.      Burnadette PopAmrit Ivonne [place], MD [facility] Pager [phone]  If 7PM-7AM, please contact night-coverage [email or web address] Password [email or web address] 05/25/2019, 12:37 PM

## 2019-05-25 NOTE — Evaluation (Addendum)
Physical Therapy Evaluation Patient Details Name: KAHLANI GRABER MRN: [id number] DOB: August 01, 1950 Today's Date: 05/25/2019   History of Present Illness  68 year old female with history of arthritis, insulin-dependent diabetes, hypertension, seizures who presented to the emergency department after she fell at home and lying on the floor for prolonged time.  She was seen in the emergency department on 11/12 after a mechanical fall and found to have acute minimally displaced comminuted fracture of the left humeral head and neck and acute L2 inferior endplate compression fracture with minimal height loss.  Orthopedics and neurosurgery were consulted at the time and they recommended to follow her as an outpatient.  After she was discharged home, she slipped on a wet surface on the driveway and fell.  Clinical Impression  Pt admitted with above diagnosis. Mod assist for bed mobility, min assist to stand and pivot to recliner. She is not safe to return home as she is high falls risk and lacks 24* care there (her husband works nights and sleeps during the day).  Pt currently with functional limitations due to the deficits listed below (see PT Problem List). Pt will benefit from skilled PT to increase their independence and safety with mobility to allow discharge to the venue listed below.       Follow Up Recommendations SNF;Supervision for mobility/OOB    Equipment Recommendations  (TBD at next venue)    Recommendations for Other Services       Precautions / Restrictions Precautions Precautions: Fall; back Precaution Comments: 2 recent falls Required Braces or Orthoses: Sling;Spinal Brace Spinal Brace: Thoracolumbosacral orthotic(pt has a TLSO, however it is very heavily soiled, ortho tech aware and to investigate options) Restrictions Weight Bearing Restrictions: Yes Other Position/Activity Restrictions: LUE NWB      Mobility  Bed Mobility Overal bed mobility: Needs Assistance Bed  Mobility: Rolling;Sidelying to Sit Rolling: Mod assist Sidelying to sit: Mod assist       General bed mobility comments: assist to initiate roll and to raise trunk, pt fearful of movement, very focused on LUE pain  Transfers Overall transfer level: Needs assistance   Transfers: Sit to/from Stand;Stand Pivot Transfers Sit to Stand: Min assist;+2 safety/equipment Stand pivot transfers: Min assist;+2 safety/equipment       General transfer comment: assist to rise/steady, no loss of balance/buckling, activity tolerance limited by LUE pain/fatigue  Ambulation/Gait Ambulation/Gait assistance: Min assist;+2 safety/equipment Gait Distance (Feet): 4 Feet Assistive device: 1 person hand held assist Gait Pattern/deviations: Step-to pattern     General Gait Details: several pivotal steps from bed to recliner, tolerance limited by pain  Stairs            Wheelchair Mobility    Modified Rankin (Stroke Patients Only)       Balance Overall balance assessment: Needs assistance Sitting-balance support: Feet supported;Single extremity supported       Standing balance support: Single extremity supported Standing balance-Leahy Scale: Poor Standing balance comment: min single UE support                             Pertinent Vitals/Pain Pain Assessment: 0-10 Pain Score: 10-Worst pain ever Pain Location: LUE Pain Descriptors / Indicators: Throbbing Pain Intervention(s): Limited activity within patient's tolerance;Monitored during session;Premedicated before session;Relaxation;Repositioned    Home Living Family/patient expects to be discharged to:: Private residence Living Arrangements: Spouse/significant other     Home Access: Stairs to enter Entrance Stairs-Rails: Right Entrance Stairs-Number of Steps: 4 Home Layout:  One level   Additional Comments: husband works nights, pt can't remember if she has DME    Prior Function Level of Independence: Independent                Hand Dominance        Extremity/Trunk Assessment   Upper Extremity Assessment Upper Extremity Assessment: Defer to OT evaluation    Lower Extremity Assessment Lower Extremity Assessment: Overall WFL for tasks assessed    Cervical / Trunk Assessment Cervical / Trunk Assessment: Normal  Communication   Communication: No difficulties  Cognition Arousal/Alertness: Awake/alert Behavior During Therapy: Anxious Overall Cognitive Status: No family/caregiver present to determine baseline cognitive functioning                                 General Comments: pt had some memory deficits, she asked why she was in the hospital, couldn't recall if she'd fallen at home or not PTA      General Comments      Exercises     Assessment/Plan    PT Assessment Patient needs continued PT services  PT Problem List Decreased strength;Decreased range of motion;Decreased activity tolerance;Decreased mobility;Pain;Decreased safety awareness;Decreased balance       PT Treatment Interventions Gait training;Functional mobility training;Therapeutic exercise;Therapeutic activities;Balance training;Patient/family education    PT Goals (Current goals can be found in the Care Plan section)  Acute Rehab PT Goals Patient Stated Goal: likes to watch tv PT Goal Formulation: With patient Time For Goal Achievement: 06/08/19 Potential to Achieve Goals: Fair    Frequency Min 3X/week   Barriers to discharge        Co-evaluation PT/OT/SLP Co-Evaluation/Treatment: Yes Reason for Co-Treatment: Complexity of the patient's impairments (multi-system involvement);Necessary to address cognition/behavior during functional activity;For patient/therapist safety;To address functional/ADL transfers PT goals addressed during session: Mobility/safety with mobility;Balance         AM-PAC PT "6 Clicks" Mobility  Outcome Measure Help needed turning from your back to your side  while in a flat bed without using bedrails?: A Lot Help needed moving from lying on your back to sitting on the side of a flat bed without using bedrails?: A Lot Help needed moving to and from a bed to a chair (including a wheelchair)?: A Little Help needed standing up from a chair using your arms (e.g., wheelchair or bedside chair)?: A Little Help needed to walk in hospital room?: A Lot Help needed climbing 3-5 steps with a railing? : Total 6 Click Score: 13    End of Session Equipment Utilized During Treatment: Gait belt Activity Tolerance: Patient limited by pain;Patient limited by fatigue Patient left: in chair;with call bell/phone within reach;with nursing/sitter in room Nurse Communication: Mobility status PT Visit Diagnosis: History of falling (Z91.81);Repeated falls (R29.6);Difficulty in walking, not elsewhere classified (R26.2);Pain Pain - Right/Left: Left Pain - part of body: Arm    Time: 1142-1209 PT Time Calculation (min) (ACUTE ONLY): 27 min   Charges:   PT Evaluation $PT Eval Moderate Complexity: 1 Mod         Tamala Ser PT 05/25/2019  Acute Rehabilitation Services Pager [phone] Office [phone]

## 2019-05-25 NOTE — TOC Progression Note (Signed)
Transition of Care [facility]) - Progression Note    Patient Details  Name: Kelly Richardson MRN: [id number] Date of Birth: 01/21/1951  [facility]) CM/SW Contact  Magon Croson, Juliann Pulse, RN Phone Number: 05/25/2019, 12:28 PM  Clinical Narrative:  Awaiting PT to see prior faxing out to SNF.     Expected Discharge Plan: [place] Barriers to Discharge: Continued Medical Work up  Expected Discharge Plan and Services Expected Discharge Plan: [place]   Discharge Planning Services: CM Consult Post Acute Care Choice: [place] Living arrangements for the past 2 months: Single Family Home                                       Social Determinants of Health (SDOH) Interventions    Readmission Risk Interventions No flowsheet data found.

## 2019-05-26 LAB — GLUCOSE, CAPILLARY
Glucose-Capillary: 136 mg/dL — ABNORMAL HIGH (ref 70–99)
Glucose-Capillary: 185 mg/dL — ABNORMAL HIGH (ref 70–99)

## 2019-05-26 MED ORDER — TRAMADOL HCL 50 MG PO TABS: 50.0000 mg | ORAL_TABLET | Freq: Four times a day (QID) | ORAL | 0 refills | Status: DC | PRN

## 2019-05-26 MED ORDER — FERROUS SULFATE 325 (65 FE) MG PO TABS: 325.0000 mg | ORAL_TABLET | Freq: Every day | ORAL | Status: DC

## 2019-05-26 MED ORDER — FERROUS SULFATE 325 (65 FE) MG PO TABS: 325.0000 mg | ORAL_TABLET | Freq: Every day | ORAL | 3 refills | Status: DC

## 2019-05-26 NOTE — TOC Transition Note (Signed)
Transition of Care [facility]) - CM/SW Discharge Note   Patient Details  Name: ROMONA MURDY MRN: [id number] Date of Birth: Feb 19, 1951  Transition of Care [facility]) CM/SW Contact:  Wende Neighbors, LCSW Phone Number: 05/26/2019, 1:13 PM   Clinical Narrative:   Patient to discharge to [facility] via [place]. RN to call [phone] (rm# [phone]) for report. PTAR has been called    Final next level of care: Skilled Nursing Facility Barriers to Discharge: No Barriers Identified   Patient Goals and CMS Choice Patient states their goals for this hospitalization and ongoing recovery are:: go to rehab      Discharge Placement              Patient chooses bed at: [facility] Patient to be transferred to facility by: ptar Name of family member notified: pt stated she will call family Patient and family notified of of transfer: 05/26/19  Discharge Plan and Services   Discharge Planning Services: CM Consult Post Acute Care Choice: [place]                               Social Determinants of Health (SDOH) Interventions     Readmission Risk Interventions No flowsheet data found.

## 2019-05-26 NOTE — Progress Notes (Signed)
Attempted to call report to RN at [facility] place and placed on hold for 12 minutes. Tom at [place] place provided me [place], [place] for [place] place and her cell phone number to give report, [phone]. Called and voicemail is full. Will try again before discharge.

## 2019-05-26 NOTE — Discharge Summary (Signed)
Physician Discharge Summary  Kelly Richardson NWG:[id number] DOB: Jul 13, 1950 DOA: 05/22/2019  PCP: Iona Hansen, NP  Admit date: 05/22/2019 Discharge date: 05/26/2019  Admitted From: Home Disposition:  SNF  Discharge Condition:Stable CODE STATUS:FULL, Diet recommendation: Heart Healthy   Brief/Interim Summary:  Patient is a 68 year old female with history of arthritis, insulin-dependent diabetes, hypertension, seizures who presented to the emergency department after she fell at home and lying on the floor for prolonged time.  She was seen in the emergency department on 11/12 after a mechanical fall and found to have acute minimally displaced comminuted fracture of the left humeral head and neck and acute L2 inferior endplate compression fracture with minimal height loss.  Orthopedics and neurosurgery were consulted at the time and they recommended to follow her as an outpatient.  After she was discharged home, she slipped on a wet surface on the driveway and fell.  Patient admitted for the evaluation of PT/OT. PT/OT recommended SNF.  Patient is hemodynamically stable for discharge.  Following problems were addressed during her hospitalization:  Left humeral head/neck fracture secondary to mechanical fall: Imaging done on 11/12 showed acute minimally displaced comminuted fracture of the left humeral head/neck.  Orthopedics was consulted at the time and recommended to follow as an outpatient.We recommend to follow-up with [facility] orthopedics in 2 weeks. She has a sling on her left arm.  PT/OT again consulted and recommended SNF.  Lumbar compression fracture: Imaging done on 11/12 showed acute L2 inferior endplate compression fracture with minimal height loss.  Neurosurgery was consulted at the time and recommended outpatient follow-up.  TLSO brace recommended.  Follow up with neurosurgery in 4 weeks.   Leukocytosis: Most likely reactive.  UA not impressive for UTI.Chest x-ray did not show  pneumonia.  Hyperglycemia/insulin-dependent diabetes mellitus: Continue home regimen.Hemoglobin A1c is 10.6. Diabetic coordinator was following.  Hypertension: BP stable. continue home lisinopril  Hyperlipidemia: Continue Lipitor  Microcytic anemia: Iron studies showed low iron.  She does not have any active bleeding.  She was transfused with IV iron. Continue oral supplementation on discharge.  Discharge Diagnoses:  Principal Problem:   Humerus fracture Active Problems:   HTN (hypertension)   Closed lumbar vertebral fracture (HCC)   Leukocytosis   Hyperglycemia   Fall    Discharge Instructions  Discharge Instructions    Diet - low sodium heart healthy   Complete by: As directed    Discharge instructions   Complete by: As directed    1)Please continue your medications as instructed. 2)Follow up with orthopedics and neurosurgery as an outpatient.   Increase activity slowly   Complete by: As directed      Allergies as of 05/26/2019      Reactions   Metformin Other (See Comments)   REACTION: faintness, DIZZINESS   Penicillins Anaphylaxis   Seldane [terfenadine] Other (See Comments)   N/V, increased heart rate, seizures, hallinations.   Sulfonamide Derivatives Anaphylaxis   Vicks Formula 44 Cough Relief [dextromethorphan Hbr] Other (See Comments)   Runs blood pressure up    Xanax [alprazolam] Other (See Comments)   Pt does not want Xanax--history of addiction.   Iohexol Other (See Comments)    Desc: pt states she [place] ct [place] and immediately experienced profuse vomiting and sweating.  She said they treated her like she was having a reaction and doesn't remember much else or if meds were given., Onset Date: [id number]   Latex Other (See Comments)   BLISTERING   Other Nausea  And Vomiting   "Anything I have ever been given more nausea just increases it."   Percocet [oxycodone-acetaminophen] Other (See Comments)   Hallucinations      Medication List     STOP taking these medications   baclofen 10 MG tablet Commonly known as: LIORESAL   oxyCODONE 5 MG immediate release tablet Commonly known as: Roxicodone     TAKE these medications   Accu-Chek Aviva Plus test strip Generic drug: glucose blood 1 each by Other route daily.   aspirin 81 MG chewable tablet Chew 81 mg by mouth every evening.   atorvastatin 20 MG tablet Commonly known as: LIPITOR Take 20 mg by mouth every evening.   ferrous sulfate 325 (65 FE) MG tablet Take 1 tablet (325 mg total) by mouth daily with breakfast. Start taking on: May 27, 2019   Fifty50 Pen Needles 31G X 8 MM Misc Generic drug: Insulin Pen Needle 1 each by Other route daily.   Insulin Detemir 100 UNIT/ML Pen Commonly known as: Levemir FlexTouch Inject 40 Units into the skin at bedtime. What changed: how much to take   ketoconazole 2 % shampoo Commonly known as: NIZORAL Apply 1 application topically daily as needed for irritation.   lidocaine 5 % Commonly known as: Lidoderm Place 1 patch onto the skin daily. Remove & Discard patch within 12 hours or as directed by MD   lisinopril 2.5 MG tablet Commonly known as: ZESTRIL Take 2.5 mg by mouth every evening.   NovoLOG FlexPen 100 UNIT/ML FlexPen Generic drug: insulin aspart Inject 15 Units as directed 3 (three) times daily with meals. Per sliding scale and not to exceed 81 units daily.   traMADol 50 MG tablet Commonly known as: Ultram Take 1 tablet (50 mg total) by mouth every 6 (six) hours as needed.      Follow-up Information    Nadara Mustard, MD. Schedule an appointment as soon as possible for a visit in 2 week(s).   Specialty: Orthopedic Surgery Contact information: [address] [place] [address] [phone]        Barnett Abu, MD. Schedule an appointment as soon as possible for a visit in 4 week(s).   Specialty: Neurosurgery Contact information: [address] [place]  [address] [phone]          Allergies  Allergen Reactions  . Metformin Other (See Comments)    REACTION: faintness, DIZZINESS  . Penicillins Anaphylaxis  . Seldane [Terfenadine] Other (See Comments)    N/V, increased heart rate, seizures, hallinations.  . Sulfonamide Derivatives Anaphylaxis  . Vicks Formula 44 Cough Relief [Dextromethorphan Hbr] Other (See Comments)    Runs blood pressure up   . Xanax [Alprazolam] Other (See Comments)    Pt does not want Xanax--history of addiction.  . Iohexol Other (See Comments)     Desc: pt states she [place] ct [place] and immediately experienced profuse vomiting and sweating.  She said they treated her like she was having a reaction and doesn't remember much else or if meds were given., Onset Date: [id number]   . Latex Other (See Comments)    BLISTERING  . Other Nausea And Vomiting    "Anything I have ever been given more nausea just increases it."  . Percocet [Oxycodone-Acetaminophen] Other (See Comments)    Hallucinations    Consultations: None  Procedures/Studies: Ct Abdomen Pelvis Wo Contrast  Result Date: 05/18/2019 CLINICAL DATA:  Back pain after fall. EXAM: CT CHEST, ABDOMEN  AND PELVIS WITHOUT CONTRAST TECHNIQUE: Multidetector CT imaging of the chest, abdomen and pelvis was performed following the standard protocol without IV contrast. COMPARISON:  CT chest dated Nov 21, 2009. FINDINGS: CT CHEST FINDINGS Cardiovascular: Normal heart size. No pericardial effusion. No thoracic aortic aneurysm. Aortic arch and branch vessel atherosclerotic vascular disease. Mediastinum/Nodes: No enlarged mediastinal, hilar, or axillary lymph nodes. Thyroid gland, trachea, and esophagus demonstrate no significant findings. Lungs/Pleura: No focal consolidation, pleural effusion, or pneumothorax. No suspicious pulmonary nodule. Mild subsegmental atelectasis in both lower lobes. Musculoskeletal: Acute minimally displaced comminuted fracture of the left  humeral head and neck with involvement of the lesser and greater tuberosities. No additional fracture. CT ABDOMEN PELVIS FINDINGS Hepatobiliary: No hepatic injury or perihepatic hematoma. Status post cholecystectomy. No biliary dilatation. Pancreas: Unremarkable. No pancreatic ductal dilatation or surrounding inflammatory changes. Spleen: Lobulated spleen without injury or perisplenic hematoma. Adrenals/Urinary Tract: No adrenal hemorrhage or renal injury identified. Bladder is unremarkable. Stomach/Bowel: Small hiatal hernia. The stomach is otherwise within normal limits. No bowel wall thickening, distention, or surrounding inflammatory changes. Normal appendix. Vascular/Lymphatic: Aortic atherosclerosis. No enlarged abdominal or pelvic lymph nodes. Reproductive: Status post hysterectomy. No adnexal masses. Other: No abdominal wall hernia or abnormality. No abdominopelvic ascites. No pneumoperitoneum. Musculoskeletal: Acute compression fracture of the L2 inferior endplate with minimal height loss centrally. No retropulsion. Prior right total hip arthroplasty. Atrophy of the right iliopsoas and gluteus muscles. Mild superficial fat stranding on both sides of the lower anterior abdominal wall, favored injection related. IMPRESSION: Chest: 1. Acute minimally displaced comminuted fracture of the left humeral head and neck with involvement of the lesser and greater tuberosities. 2.  Aortic atherosclerosis (ICD10-I70.0). Abdomen and pelvis: 1. Acute L2 inferior endplate compression fracture with minimal height loss centrally. No retropulsion. 2. Small hiatal hernia. Electronically Signed   By: Titus Dubin M.D.   On: 05/18/2019 19:35   Dg Thoracic Spine W/swimmers  Result Date: 05/18/2019 CLINICAL DATA:  [place] with back pain. EXAM: THORACIC SPINE - 3 VIEWS COMPARISON:  Chest radiography 05/07/2018 FINDINGS: Minimal curvature convex to the left. Question minimal compression fractures at superior T12 and possibly  inferior T11. This is not definite. No advanced fracture. CT could be performed if desired. IMPRESSION: 1. Question minimal compression fractures at superior T12 and possibly inferior T11. This is not definite. CT could be performed if desired. Electronically Signed   By: Nelson Chimes M.D.   On: 05/18/2019 15:13   Dg Lumbar Spine Complete  Result Date: 05/18/2019 CLINICAL DATA:  [place] with low back pain. EXAM: LUMBAR SPINE - COMPLETE 4+ VIEW COMPARISON:  None. FINDINGS: Mild curvature convex to the right. No antero or retrolisthesis. No evidence of lower thoracic or lumbosacral fracture. No significant degenerative disc disease. Ordinary lower lumbar facet osteoarthritis. IMPRESSION: No acute or traumatic finding. Mild curvature convex to the right. Ordinary lower lumbar facet osteoarthritis. Electronically Signed   By: Nelson Chimes M.D.   On: 05/18/2019 15:10   Ct Chest Wo Contrast  Result Date: 05/18/2019 CLINICAL DATA:  Back pain after fall. EXAM: CT CHEST, ABDOMEN AND PELVIS WITHOUT CONTRAST TECHNIQUE: Multidetector CT imaging of the chest, abdomen and pelvis was performed following the standard protocol without IV contrast. COMPARISON:  CT chest dated Nov 21, 2009. FINDINGS: CT CHEST FINDINGS Cardiovascular: Normal heart size. No pericardial effusion. No thoracic aortic aneurysm. Aortic arch and branch vessel atherosclerotic vascular disease. Mediastinum/Nodes: No enlarged mediastinal, hilar, or axillary lymph nodes. Thyroid gland, trachea, and esophagus demonstrate no significant findings.  Lungs/Pleura: No focal consolidation, pleural effusion, or pneumothorax. No suspicious pulmonary nodule. Mild subsegmental atelectasis in both lower lobes. Musculoskeletal: Acute minimally displaced comminuted fracture of the left humeral head and neck with involvement of the lesser and greater tuberosities. No additional fracture. CT ABDOMEN PELVIS FINDINGS Hepatobiliary: No hepatic injury or perihepatic hematoma.  Status post cholecystectomy. No biliary dilatation. Pancreas: Unremarkable. No pancreatic ductal dilatation or surrounding inflammatory changes. Spleen: Lobulated spleen without injury or perisplenic hematoma. Adrenals/Urinary Tract: No adrenal hemorrhage or renal injury identified. Bladder is unremarkable. Stomach/Bowel: Small hiatal hernia. The stomach is otherwise within normal limits. No bowel wall thickening, distention, or surrounding inflammatory changes. Normal appendix. Vascular/Lymphatic: Aortic atherosclerosis. No enlarged abdominal or pelvic lymph nodes. Reproductive: Status post hysterectomy. No adnexal masses. Other: No abdominal wall hernia or abnormality. No abdominopelvic ascites. No pneumoperitoneum. Musculoskeletal: Acute compression fracture of the L2 inferior endplate with minimal height loss centrally. No retropulsion. Prior right total hip arthroplasty. Atrophy of the right iliopsoas and gluteus muscles. Mild superficial fat stranding on both sides of the lower anterior abdominal wall, favored injection related. IMPRESSION: Chest: 1. Acute minimally displaced comminuted fracture of the left humeral head and neck with involvement of the lesser and greater tuberosities. 2.  Aortic atherosclerosis (ICD10-I70.0). Abdomen and pelvis: 1. Acute L2 inferior endplate compression fracture with minimal height loss centrally. No retropulsion. 2. Small hiatal hernia. Electronically Signed   By: Obie Dredge M.D.   On: 05/18/2019 19:35   Ct Thoracic Spine Wo Contrast  Result Date: 05/18/2019 CLINICAL DATA:  Compression fracture. EXAM: CT THORACIC SPINE WITHOUT CONTRAST TECHNIQUE: Multidetector CT images of the thoracic were obtained using the standard protocol without intravenous contrast. COMPARISON:  X-ray from the same day FINDINGS: Alignment: Normal. Vertebrae: No acute fracture or focal pathologic process. Paraspinal and other soft tissues: There are atherosclerotic changes of the thoracic  aorta. There is a small hiatal hernia. The patient is status post prior cholecystectomy. Evaluation of the lung fields is severely limited by respiratory motion artifact. There is no clear acute abnormality. Disc levels: There is mild disc height loss throughout the thoracic spine. There is mild-to-moderate disc height loss involving the lower cervical spine. IMPRESSION: No acute displaced fracture. Electronically Signed   By: Katherine Mantle M.D.   On: 05/18/2019 18:02   Ct L-spine No Charge  Result Date: 05/18/2019 CLINICAL DATA:  Lumbar back pain after a fall. EXAM: CT LUMBAR SPINE WITHOUT CONTRAST TECHNIQUE: Multidetector CT imaging of the lumbar spine was performed without intravenous contrast administration. Multiplanar CT image reconstructions were also generated. COMPARISON:  None. FINDINGS: Segmentation: 5 lumbar type vertebral bodies. Alignment: Normal Vertebrae: Possible minimal inferior endplate fracture at L2. I favor that this is an acute injury, but there is some potential that could be a chronic finding. Paraspinal and other soft tissues: Negative Disc levels: No disc abnormality at L3-4 or above. L4-5: Mild bulging of the disc. Mild facet degeneration. No compressive stenosis. L5-S1: Mild bulging of the disc. No disc herniation. Mild facet degeneration. No compressive stenosis. IMPRESSION: Suspicion of a minimal acute inferior endplate fracture at L2. This is not absolutely certain, but I think it is an acute injury. Mild disc bulges and facet degeneration at L4-5 and L5-S1 without compressive stenosis. Electronically Signed   By: Paulina Fusi M.D.   On: 05/18/2019 19:26   Dg Chest Port 1 View  Result Date: 05/23/2019 CLINICAL DATA:  Leukocytosis. EXAM: PORTABLE CHEST 1 VIEW COMPARISON:  05/06/2018 FINDINGS: Lungs are clear. Cardiomediastinal contours  are normal. Impacted left humeral neck fracture as before. IMPRESSION: 1. No active cardiopulmonary disease. 2. Left humeral fracture of  surgical neck with impaction as before. Electronically Signed   By: Donzetta KohutGeoffrey  Wile M.D.   On: 05/23/2019 09:41   Dg Humerus Left  Result Date: 05/18/2019 CLINICAL DATA:  Larey SeatFell with pain and deformity. EXAM: LEFT HUMERUS - 2+ VIEW COMPARISON:  None. FINDINGS: Acute fracture at the surgical neck of the humerus. Mild/minimal impaction and angulation. Humeral head is probably properly located. No other regional fracture. IMPRESSION: Acute fracture at the surgical neck of the humerus. Electronically Signed   By: Paulina FusiMark  Shogry M.D.   On: 05/18/2019 15:09       Subjective: Patient seen and examined at bedside this morning.  Hemodynamically  stable for discharge.  Discharge Exam: Vitals:   05/25/19 2037 05/26/19 0452  BP: 123/64 116/70  Pulse: 94 83  Resp: 18 18  Temp: 98.2 F (36.8 C) 98.9 F (37.2 C)  SpO2: 94% 95%   Vitals:   05/25/19 0800 05/25/19 1419 05/25/19 2037 05/26/19 0452  BP: 114/70 120/76 123/64 116/70  Pulse: 93 (!) 108 94 83  Resp: 18  18 18   Temp: (!) 97.4 F (36.3 C) 99.8 F (37.7 C) 98.2 F (36.8 C) 98.9 F (37.2 C)  TempSrc: Oral Axillary Oral Oral  SpO2: 94% 92% 94% 95%  Weight:      Height:        General: Pt is alert, awake, not in acute distress Cardiovascular: RRR, S1/S2 +, no rubs, no gallops Respiratory: CTA bilaterally, no wheezing, no rhonchi Abdominal: Soft, NT, ND, bowel sounds + Extremities: no edema, no cyanosis    The results of significant diagnostics from this hospitalization (including imaging, microbiology, ancillary and laboratory) are listed below for reference.     Microbiology: Recent Results (from the past 240 hour(s))  SARS CORONAVIRUS 2 (TAT 6-24 HRS) Nasopharyngeal Nasopharyngeal Swab     Status: None   Collection Time: 05/23/19 12:43 AM   Specimen: Nasopharyngeal Swab  Result Value Ref Range Status   SARS Coronavirus 2 NEGATIVE NEGATIVE Final    Comment: (NOTE) SARS-CoV-2 target nucleic acids are NOT DETECTED. The  SARS-CoV-2 RNA is generally detectable in upper and lower respiratory specimens during the acute phase of infection. Negative results do not preclude SARS-CoV-2 infection, do not rule out co-infections with other pathogens, and should not be used as the sole basis for treatment or other patient management decisions. Negative results must be combined with clinical observations, patient history, and epidemiological information. The expected result is Negative. Fact Sheet for Patients: HairSlick.nohttps://www.fda.gov/media/138098/download Fact Sheet for Healthcare Providers: quierodirigir.comhttps://www.fda.gov/media/138095/download This test is not yet approved or cleared by the [place] FDA and  has been authorized for detection and/or diagnosis of SARS-CoV-2 by FDA under an Emergency Use Authorization (EUA). This EUA will remain  in effect (meaning this test can be used) for the duration of the COVID-19 declaration under Section 56 4(b)(1) of the Act, 21 U.S.C. section 360bbb-3(b)(1), unless the authorization is terminated or revoked sooner. Performed at [facility], [address] [place], [place] [id number]      Labs: BNP (last 3 results) No results for input(s): BNP in the last 8760 hours. Basic Metabolic Panel: Recent Labs  Lab 05/22/19 2325 05/23/19 0451  NA 135 135  K 4.6 4.3  CL 101 104  CO2 21* 18*  GLUCOSE 282* 282*  BUN 17 17  CREATININE 0.86 0.84  CALCIUM 9.4 8.9  Liver Function Tests: Recent Labs  Lab 05/22/19 2325  AST 21  ALT 25  ALKPHOS 92  BILITOT 1.1  PROT 8.3*  ALBUMIN 3.8   No results for input(s): LIPASE, AMYLASE in the last 168 hours. No results for input(s): AMMONIA in the last 168 hours. CBC: Recent Labs  Lab 05/22/19 2325 05/23/19 0451 05/24/19 0815 05/25/19 0532  WBC 16.0* 16.6* 11.5* 11.8*  NEUTROABS  --   --  7.0 7.3  HGB 12.3 11.8* 9.0* 8.9*  HCT 44.1 43.8 34.1* 32.2*  MCV 71.0* 73.5* 75.4* 72.9*  PLT 363 364 240 291   Cardiac  Enzymes: No results for input(s): CKTOTAL, CKMB, CKMBINDEX, TROPONINI in the last 168 hours. BNP: Invalid input(s): POCBNP CBG: Recent Labs  Lab 05/25/19 0757 05/25/19 1135 05/25/19 1619 05/25/19 2034 05/26/19 0749  GLUCAP 162* 177* 206* 265* 136*   D-Dimer No results for input(s): DDIMER in the last 72 hours. Hgb A1c No results for input(s): HGBA1C in the last 72 hours. Lipid Profile No results for input(s): CHOL, HDL, LDLCALC, TRIG, CHOLHDL, LDLDIRECT in the last 72 hours. Thyroid function studies No results for input(s): TSH, T4TOTAL, T3FREE, THYROIDAB in the last 72 hours.  Invalid input(s): FREET3 Anemia work up Recent Labs    05/25/19 0532  FERRITIN 13  TIBC 322  IRON 26*   Urinalysis    Component Value Date/Time   COLORURINE YELLOW 05/23/2019 0401   APPEARANCEUR HAZY (A) 05/23/2019 0401   LABSPEC 1.029 05/23/2019 0401   PHURINE 5.0 05/23/2019 0401   GLUCOSEU >=500 (A) 05/23/2019 0401   GLUCOSEU NEGATIVE 07/26/2006 1044   HGBUR NEGATIVE 05/23/2019 0401   BILIRUBINUR NEGATIVE 05/23/2019 0401   KETONESUR 80 (A) 05/23/2019 0401   PROTEINUR NEGATIVE 05/23/2019 0401   UROBILINOGEN 0.2 09/08/2012 1444   NITRITE NEGATIVE 05/23/2019 0401   LEUKOCYTESUR NEGATIVE 05/23/2019 0401   Sepsis Labs Invalid input(s): PROCALCITONIN,  WBC,  LACTICIDVEN Microbiology Recent Results (from the past 240 hour(s))  SARS CORONAVIRUS 2 (TAT 6-24 HRS) Nasopharyngeal Nasopharyngeal Swab     Status: None   Collection Time: 05/23/19 12:43 AM   Specimen: Nasopharyngeal Swab  Result Value Ref Range Status   SARS Coronavirus 2 NEGATIVE NEGATIVE Final    Comment: (NOTE) SARS-CoV-2 target nucleic acids are NOT DETECTED. The SARS-CoV-2 RNA is generally detectable in upper and lower respiratory specimens during the acute phase of infection. Negative results do not preclude SARS-CoV-2 infection, do not rule out co-infections with other pathogens, and should not be used as the sole basis  for treatment or other patient management decisions. Negative results must be combined with clinical observations, patient history, and epidemiological information. The expected result is Negative. Fact Sheet for Patients: HairSlick.no Fact Sheet for Healthcare Providers: quierodirigir.com This test is not yet approved or cleared by the [place] FDA and  has been authorized for detection and/or diagnosis of SARS-CoV-2 by FDA under an Emergency Use Authorization (EUA). This EUA will remain  in effect (meaning this test can be used) for the duration of the COVID-19 declaration under Section 56 4(b)(1) of the Act, 21 U.S.C. section 360bbb-3(b)(1), unless the authorization is terminated or revoked sooner. Performed at [facility], [address], [place], [place] [address]     Please note: You were cared for by a hospitalist during your hospital stay. Once you are discharged, your primary care physician will handle any further medical issues. Please note that NO REFILLS for any discharge medications will be authorized once you are  discharged, as it is imperative that you return to your primary care physician (or establish a relationship with a primary care physician if you do not have one) for your post hospital discharge needs so that they can reassess your need for medications and monitor your lab values.    Time coordinating discharge: 40 minutes  SIGNED:   Burnadette Pop, MD  [facility] 05/26/2019, 10:43 AM Pager [phone]  If 7PM-7AM, please contact night-coverage [email or web address] Password [email or web address]

## 2019-05-26 NOTE — Progress Notes (Signed)
Spoke with Jonelle Sidle at [facility] who took report on patient.

## 2019-05-26 NOTE — Care Management Important Message (Signed)
Important Message  Patient Details IM Letter given to [name] SW to present to the Patient Name: Kelly Richardson MRN: [id number] Date of Birth: 1950/07/18   Medicare Important Message Given:  Yes     Kerin Salen 05/26/2019, 11:59 AM

## 2019-05-29 ENCOUNTER — Emergency Department (HOSPITAL_COMMUNITY): Payer: Medicare Other

## 2019-05-29 ENCOUNTER — Observation Stay (HOSPITAL_COMMUNITY)
Admission: EM | Admit: 2019-05-29 | Discharge: 2019-05-31 | Disposition: A | Discharge status: Skilled Nursing Facility | Payer: Medicare Other | Attending: Family Medicine | Admitting: Family Medicine

## 2019-05-29 DIAGNOSIS — Z7982 Long term (current) use of aspirin: Secondary | ICD-10-CM | POA: Insufficient documentation

## 2019-05-29 DIAGNOSIS — D500 Iron deficiency anemia secondary to blood loss (chronic): Secondary | ICD-10-CM | POA: Diagnosis not present

## 2019-05-29 DIAGNOSIS — Z794 Long term (current) use of insulin: Secondary | ICD-10-CM | POA: Diagnosis not present

## 2019-05-29 DIAGNOSIS — R404 Transient alteration of awareness: Secondary | ICD-10-CM | POA: Diagnosis not present

## 2019-05-29 DIAGNOSIS — R1111 Vomiting without nausea: Secondary | ICD-10-CM | POA: Diagnosis present

## 2019-05-29 DIAGNOSIS — Z20828 Contact with and (suspected) exposure to other viral communicable diseases: Secondary | ICD-10-CM | POA: Diagnosis not present

## 2019-05-29 DIAGNOSIS — G9340 Encephalopathy, unspecified: Secondary | ICD-10-CM | POA: Diagnosis not present

## 2019-05-29 DIAGNOSIS — R4182 Altered mental status, unspecified: Secondary | ICD-10-CM

## 2019-05-29 DIAGNOSIS — I700 Atherosclerosis of aorta: Secondary | ICD-10-CM | POA: Diagnosis not present

## 2019-05-29 DIAGNOSIS — R4701 Aphasia: Secondary | ICD-10-CM | POA: Insufficient documentation

## 2019-05-29 DIAGNOSIS — M84422A Pathological fracture, left humerus, initial encounter for fracture: Secondary | ICD-10-CM | POA: Insufficient documentation

## 2019-05-29 DIAGNOSIS — E1165 Type 2 diabetes mellitus with hyperglycemia: Secondary | ICD-10-CM | POA: Diagnosis not present

## 2019-05-29 DIAGNOSIS — I10 Essential (primary) hypertension: Secondary | ICD-10-CM | POA: Insufficient documentation

## 2019-05-29 DIAGNOSIS — R531 Weakness: Secondary | ICD-10-CM | POA: Diagnosis not present

## 2019-05-29 DIAGNOSIS — I6523 Occlusion and stenosis of bilateral carotid arteries: Secondary | ICD-10-CM | POA: Diagnosis not present

## 2019-05-29 DIAGNOSIS — M1990 Unspecified osteoarthritis, unspecified site: Secondary | ICD-10-CM | POA: Diagnosis not present

## 2019-05-29 DIAGNOSIS — F419 Anxiety disorder, unspecified: Secondary | ICD-10-CM | POA: Diagnosis not present

## 2019-05-29 DIAGNOSIS — S32009A Unspecified fracture of unspecified lumbar vertebra, initial encounter for closed fracture: Secondary | ICD-10-CM | POA: Diagnosis present

## 2019-05-29 DIAGNOSIS — Z79899 Other long term (current) drug therapy: Secondary | ICD-10-CM | POA: Insufficient documentation

## 2019-05-29 LAB — URINALYSIS, ROUTINE W REFLEX MICROSCOPIC
Bilirubin Urine: NEGATIVE
Glucose, UA: 150 mg/dL — AB
Hgb urine dipstick: NEGATIVE
Ketones, ur: 20 mg/dL — AB
Leukocytes,Ua: NEGATIVE
Nitrite: NEGATIVE
Protein, ur: NEGATIVE mg/dL
Specific Gravity, Urine: 1.015 (ref 1.005–1.030)
pH: 5 (ref 5.0–8.0)

## 2019-05-29 LAB — COMPREHENSIVE METABOLIC PANEL
ALT: 24 U/L (ref 0–44)
AST: 23 U/L (ref 15–41)
Albumin: 3.7 g/dL (ref 3.5–5.0)
Alkaline Phosphatase: 93 U/L (ref 38–126)
Anion gap: 14 (ref 5–15)
BUN: 12 mg/dL (ref 8–23)
CO2: 19 mmol/L — ABNORMAL LOW (ref 22–32)
Calcium: 9.2 mg/dL (ref 8.9–10.3)
Chloride: 103 mmol/L (ref 98–111)
Creatinine, Ser: 0.86 mg/dL (ref 0.44–1.00)
GFR calc Af Amer: >60 mL/min (ref >60)
GFR calc non Af Amer: >60 mL/min (ref >60)
Glucose, Bld: 226 mg/dL — ABNORMAL HIGH (ref 70–99)
Potassium: 4.3 mmol/L (ref 3.5–5.1)
Sodium: 136 mmol/L (ref 135–145)
Total Bilirubin: 1.4 mg/dL — ABNORMAL HIGH (ref 0.3–1.2)
Total Protein: 7.3 g/dL (ref 6.5–8.1)

## 2019-05-29 LAB — CBC WITH DIFFERENTIAL/PLATELET
Abs Immature Granulocytes: 0.31 10*3/uL — ABNORMAL HIGH (ref 0.00–0.07)
Basophils Absolute: 0.1 10*3/uL (ref 0.0–0.1)
Basophils Relative: 0 %
Eosinophils Absolute: 0.4 10*3/uL (ref 0.0–0.5)
Eosinophils Relative: 3 %
HCT: 37.8 % (ref 36.0–46.0)
Hemoglobin: 10.8 g/dL — ABNORMAL LOW (ref 12.0–15.0)
Immature Granulocytes: 2 %
Lymphocytes Relative: 11 %
Lymphs Abs: 1.9 10*3/uL (ref 0.7–4.0)
MCH: 21 pg — ABNORMAL LOW (ref 26.0–34.0)
MCHC: 28.6 g/dL — ABNORMAL LOW (ref 30.0–36.0)
MCV: 73.4 fL — ABNORMAL LOW (ref 80.0–100.0)
Monocytes Absolute: 1.4 10*3/uL — ABNORMAL HIGH (ref 0.1–1.0)
Monocytes Relative: 9 %
Neutro Abs: 12.2 10*3/uL — ABNORMAL HIGH (ref 1.7–7.7)
Neutrophils Relative %: 75 %
Platelets: 391 10*3/uL (ref 150–400)
RBC: 5.15 MIL/uL — ABNORMAL HIGH (ref 3.87–5.11)
RDW: 22.7 % — ABNORMAL HIGH (ref 11.5–15.5)
WBC: 16.3 10*3/uL — ABNORMAL HIGH (ref 4.0–10.5)
nRBC: 0.3 % — ABNORMAL HIGH (ref 0.0–0.2)

## 2019-05-29 LAB — HEMOGLOBIN A1C
Hgb A1c MFr Bld: 9.5 % — ABNORMAL HIGH (ref 4.8–5.6)
Mean Plasma Glucose: 225.95 mg/dL

## 2019-05-29 LAB — CBG MONITORING, ED
Glucose-Capillary: 233 mg/dL — ABNORMAL HIGH (ref 70–99)
Glucose-Capillary: 184 mg/dL — ABNORMAL HIGH (ref 70–99)

## 2019-05-29 LAB — AMMONIA: Ammonia: 11 umol/L (ref 9–35)

## 2019-05-29 LAB — BLOOD GAS, VENOUS
Acid-base deficit: 1.4 mmol/L (ref 0.0–2.0)
Bicarbonate: 22.6 mmol/L (ref 20.0–28.0)
O2 Saturation: 63.5 %
Patient temperature: 98.6
pCO2, Ven: 37.8 mmHg — ABNORMAL LOW (ref 44.0–60.0)
pH, Ven: 7.394 (ref 7.250–7.430)
pO2, Ven: 38.8 mmHg (ref 32.0–45.0)

## 2019-05-29 LAB — RAPID URINE DRUG SCREEN, HOSP PERFORMED
Amphetamines: NOT DETECTED
Barbiturates: NOT DETECTED
Benzodiazepines: NOT DETECTED
Cocaine: NOT DETECTED
Opiates: NOT DETECTED
Tetrahydrocannabinol: NOT DETECTED

## 2019-05-29 LAB — LIPID PANEL
Cholesterol: 128 mg/dL (ref 0–200)
HDL: 42 mg/dL (ref >40)
LDL Cholesterol: 70 mg/dL (ref 0–99)
Total CHOL/HDL Ratio: 3 RATIO
Triglycerides: 79 mg/dL (ref <150)
VLDL: 16 mg/dL (ref 0–40)

## 2019-05-29 LAB — LACTIC ACID, PLASMA
Lactic Acid, Venous: 1.9 mmol/L (ref 0.5–1.9)
Lactic Acid, Venous: 1.6 mmol/L (ref 0.5–1.9)

## 2019-05-29 LAB — POC SARS CORONAVIRUS 2 AG -  ED: SARS Coronavirus 2 Ag: NEGATIVE

## 2019-05-29 MED ORDER — ONDANSETRON HCL 4 MG/2ML IJ SOLN
4.0000 mg | Freq: Once | INTRAMUSCULAR | Status: AC
  Administered 2019-05-29: 4 mg via INTRAVENOUS
  Filled 2019-05-29: qty 2

## 2019-05-29 MED ORDER — SODIUM CHLORIDE 0.9 % IV BOLUS
1000.0000 mL | Freq: Once | INTRAVENOUS | Status: AC
  Administered 2019-05-29: 16:00:00 1000 mL via INTRAVENOUS

## 2019-05-29 MED ORDER — LORAZEPAM 2 MG/ML IJ SOLN
1.0000 mg | Freq: Once | INTRAMUSCULAR | Status: AC
  Administered 2019-05-29: 1 mg via INTRAMUSCULAR
  Filled 2019-05-29: qty 1

## 2019-05-29 MED ORDER — AMMONIA AROMATIC IN INHA
RESPIRATORY_TRACT | Status: AC
  Filled 2019-05-29: qty 10

## 2019-05-29 MED ORDER — GADOBUTROL 1 MMOL/ML IV SOLN
10.0000 mL | Freq: Once | INTRAVENOUS | Status: AC | PRN
  Administered 2019-05-29: 10 mL via INTRAVENOUS

## 2019-05-29 MED ORDER — ASPIRIN 81 MG PO CHEW
81.0000 mg | CHEWABLE_TABLET | Freq: Every day | ORAL | Status: DC
  Administered 2019-05-30 – 2019-05-31 (×2): 81 mg via ORAL
  Filled 2019-05-29 (×3): qty 1

## 2019-05-29 NOTE — ED Triage Notes (Signed)
Per EMS, pt is coming from [facility] after being found laying on her side vomiting. Per EMS, pt has not had anything to eat or drink today however she told staff at facility that something was stuck in her throat. Pt follows commands but has not been verbally responding to EMS. Pt has a current left humerus fracture. EMS unable to get an orientation status.

## 2019-05-29 NOTE — ED Provider Notes (Addendum)
  Physical Exam  BP 128/88   Pulse (!) 122   Temp 99.3 F (37.4 C)   Resp (!) 33   SpO2 96%   Physical Exam  ED Course/Procedures     .Critical Care Performed by: Varney Biles, MD Authorized by: Varney Biles, MD   Critical care provider statement:    Critical care time (minutes):  32   Critical care was necessary to treat or prevent imminent or life-threatening deterioration of the following conditions:  CNS failure or compromise   Critical care was time spent personally by me on the following activities:  Discussions with consultants, evaluation of patient's response to treatment, examination of patient, ordering and performing treatments and interventions, ordering and review of laboratory studies, ordering and review of radiographic studies, pulse oximetry, re-evaluation of patient's condition, obtaining history from patient or surrogate and review of old charts    MDM   Assuming care of patient from Dr. Sherrine Maples.   Patient in the ED for altered mental status ? Workup thus far shows elevated white count. Facility was unable to provide significant hx. Seems like possibly there is some dysphagia and slurring speech. Patient at baseline ao x 3 Last normal - 6:30 am-7 am - patient spoke with wife. She had complained of trouble swallowing, but no other complaints were brought up. She was upset with the care at the facility.   Concerning findings are as following: none. Tachycardia and tachypnea noted but she was agitated. Important pending results are MRI brain.  According to Dr. Sherrine Maples, plan is to f/u on MRI.   Neuro exam for Dr. Sherrine Maples. Pt is following simple commands. PERRL, EOMI. No focal weakness. She is slumped, not able to phonate.  Concerns for infection lower given acute change in mentation. No seizure noted by anyone. She is not on psych meds.  Patient had no complains, no concerns from the nursing side. Will continue to monitor.  If MRI is neg and pt is not  at baseline normal: - Consult tele neuro. - Consider CT soft tissue neck if she fails oral challenge.    Varney Biles, MD 05/29/19 1712  7:54 PM We gave patient ammonia tablet and she had minimal response. She did cough and gag, which means she is protecting her airway okay. I am suspecting that this could be catatonia versus psychogenic condition versus nonclinical seizures.  MRI is negative. We give her Ativan with transient change.  Teleneurology was consulted and they recommend admission with EEG.  They think pain medication could have contributed.  Stable for admission at this time.    Varney Biles, MD 05/29/19 [phone]

## 2019-05-29 NOTE — ED Provider Notes (Signed)
[facility]-EMERGENCY DEPT Provider Note   CSN: [id number] Arrival date & time: 05/29/19  1338     History   Chief Complaint Chief Complaint  Patient presents with  . Altered Mental Status  . Emesis    HPI Kelly Richardson is a 68 y.o. female.     Level 5 caveat due to patient with difficulty with speaking.  Overall history is difficult to obtain.  EMS states that they were called out as patient was having nausea and vomiting and they were concerned that she was having a choking event.  Facility states that this morning patient has been calling out frequently.  She had stated that she was having difficulty with swallowing.  Facility is new to the patient.  Overall patient has been there for recent left arm fracture.  It appears that she was doing overall well today.  She was able to have conversation with people.  But overall nurse with whom I spoke with had only had the patient for 4 hours.  She states that a time the patient was moved or told to move her arms or legs that she would scream out in pain and throw up.  I talked with her husband eventually on the phone who states that he had last talked to the patient at maybe 630 or 7 AM this morning which is about 8 hours ago and she appeared to be acting normally but she is very frustrated about the care she was receiving.  She did mention that she was having a hard time eating.  Patient arrives and is unable to answer yes/no questions.  Overall difficult to obtain history from her.  She appeared to shake her head no to chest pain, shortness of breath, difficulty breathing.    The history is provided by a caregiver, the nursing home and the patient.  Illness Location:  General Severity:  Unable to specify Onset quality:  Unable to specify Timing:  Unable to specify Progression:  Unable to specify Chronicity:  New   Past Medical History:  Diagnosis Date  . Arthritis   . Complication of anesthesia   . Diabetes  mellitus without complication (HCC)   . Heart murmur    as a child  . Hypertension   . PONV (postoperative nausea and vomiting)   . Seizures (HCC)   . Shortness of breath dyspnea    sometimes  at night    Patient Active Problem List   Diagnosis Date Noted  . Humerus fracture 05/23/2019  . Closed lumbar vertebral fracture (HCC) 05/23/2019  . Leukocytosis 05/23/2019  . Hyperglycemia 05/23/2019  . Fall 05/23/2019  . Health care maintenance 04/09/2015  . H/O total hip arthroplasty 11/09/2014  . Osteomyelitis of right hip (HCC) 07/12/2014  . Chest pain 06/22/2014  . Anemia due to blood loss 06/20/2014  . Status post revision of total hip replacement 06/08/2014  . Anxiety state 03/05/2008  . FATIGUE 03/05/2008  . Uncontrolled insulin dependent diabetes mellitus 01/02/2008  . Hyperlipidemia 01/02/2008  . HTN (hypertension) 01/02/2008    Past Surgical History:  Procedure Laterality Date  . ABDOMINAL HYSTERECTOMY    . CHOLECYSTECTOMY  1995  . COLONOSCOPY    . EXCISIONAL TOTAL HIP ARTHROPLASTY WITH ANTIBIOTIC SPACERS Right 09/07/2014   Procedure: Excision Total Hip Arthroplasty, Place Antibiotic Spacer;  Surgeon: Nadara MustardMarcus Duda V, MD;  Location: [facility] OR;  Service: Orthopedics;  Laterality: Right;  . INCISION AND DRAINAGE HIP Right 06/08/2014   Procedure: Irrigation and Debridement Right  Hip, Revision of Cup and Head of Total Hip with  Placement  of Antibiotic Spacer;  Surgeon: Nadara Mustard, MD;  Location: [facility] OR;  Service: Orthopedics;  Laterality: Right;  . TONSILLECTOMY    . TOTAL HIP ARTHROPLASTY Right 2011  . TOTAL HIP REVISION Right 11/09/2014   Procedure: TOTAL HIP REVISION;  Surgeon: Nadara Mustard, MD;  Location: [facility] OR;  Service: Orthopedics;  Laterality: Right;     OB History   No obstetric history on file.      Home Medications    Prior to Admission medications   Medication Sig Start Date End Date Taking? Authorizing Provider  ACCU-CHEK AVIVA PLUS test strip 1 each by  Other route daily. 05/02/19   [provider]  aspirin 81 MG chewable tablet Chew 81 mg by mouth every evening.     [provider]  atorvastatin (LIPITOR) 20 MG tablet Take 20 mg by mouth every evening.  03/11/19   [provider]  ferrous sulfate 325 (65 FE) MG tablet Take 1 tablet (325 mg total) by mouth daily with breakfast. 05/27/19   Burnadette Pop, MD  insulin aspart (NOVOLOG FLEXPEN) 100 UNIT/ML FlexPen Inject 15 Units as directed 3 (three) times daily with meals. Per sliding scale and not to exceed 81 units daily.  04/05/19   [provider]  Insulin Detemir (LEVEMIR FLEXTOUCH) 100 UNIT/ML Pen Inject 40 Units into the skin at bedtime. Patient taking differently: Inject 50 Units into the skin at bedtime.  12/09/16   Pricilla Loveless, MD  Insulin Pen Needle (FIFTY50 PEN NEEDLES) 31G X 8 MM MISC 1 each by Other route daily. 02/15/18   [provider]  ketoconazole (NIZORAL) 2 % shampoo Apply 1 application topically daily as needed for irritation.  04/17/19   [provider]  lidocaine (LIDODERM) 5 % Place 1 patch onto the skin daily. Remove & Discard patch within 12 hours or as directed by MD 05/18/19   Alvira Monday, MD  lisinopril (PRINIVIL,ZESTRIL) 2.5 MG tablet Take 2.5 mg by mouth every evening.  06/24/17   [provider]  traMADol (ULTRAM) 50 MG tablet Take 1 tablet (50 mg total) by mouth every 6 (six) hours as needed. 05/26/19   Burnadette Pop, MD    Family History No family history on file.  Social History Social History   Tobacco Use  . Smoking status: Never Smoker  . Smokeless tobacco: Never Used  Substance Use Topics  . Alcohol use: No    Alcohol/week: 0.0 standard drinks  . Drug use: No     Allergies   Metformin, Penicillins, Seldane [terfenadine], Sulfonamide derivatives, Vicks formula 44 cough relief [dextromethorphan hbr], Xanax [alprazolam], Iohexol, Latex, Other, and Percocet [oxycodone-acetaminophen]    Review of Systems Review of Systems  Unable to perform ROS: Patient nonverbal     Physical Exam Updated Vital Signs  ED Triage Vitals  Enc Vitals Group     BP 05/29/19 1400 128/88     Pulse Rate 05/29/19 1400 (!) 122     Resp 05/29/19 1400 (!) 33     Temp 05/29/19 1418 99.3 F (37.4 C)     Temp src --      SpO2 05/29/19 1346 94 %     Weight --      Height --      Head Circumference --      Peak Flow --      Pain Score --      Pain Loc --  Pain Edu? --      Excl. in [place]? --     Physical Exam Vitals signs and nursing note reviewed.  Constitutional:      General: She is not in acute distress.    Appearance: She is well-developed. She is obese. She is ill-appearing.  HENT:     Head: Normocephalic and atraumatic.     Nose: Nose normal.     Mouth/Throat:     Mouth: Mucous membranes are moist.     Pharynx: No oropharyngeal exudate or posterior oropharyngeal erythema.  Eyes:     Extraocular Movements: Extraocular movements intact.     Conjunctiva/sclera: Conjunctivae normal.     Pupils: Pupils are equal, round, and reactive to light.  Neck:     Musculoskeletal: Normal range of motion and neck supple. No muscular tenderness.  Cardiovascular:     Rate and Rhythm: Regular rhythm. Tachycardia present.     Pulses: Normal pulses.     Heart sounds: Normal heart sounds. No murmur.  Pulmonary:     Effort: Pulmonary effort is normal. No respiratory distress.     Breath sounds: Normal breath sounds.  Abdominal:     General: There is no distension.     Palpations: Abdomen is soft.     Tenderness: There is no abdominal tenderness.  Musculoskeletal:        General: No tenderness.  Skin:    General: Skin is warm and dry.     Capillary Refill: Capillary refill takes less than 2 seconds.  Neurological:     Mental Status: She is alert.     Comments: Patient follows commands, she is able to slightly move all 4 of her extremities but appears to have very poor effort, she  appears to have normal extraocular movement, she appears to possibly have some left-sided neglect, she tries to mouth words to me but is unable to phonate, she is able to range her head side to side without any pain      ED Treatments / Results  Labs (all labs ordered are listed, but only abnormal results are displayed) Labs Reviewed  COMPREHENSIVE METABOLIC PANEL - Abnormal; Notable for the following components:      Result Value   CO2 19 (*)    Glucose, Bld 226 (*)    Total Bilirubin 1.4 (*)    All other components within normal limits  CBC WITH DIFFERENTIAL/PLATELET - Abnormal; Notable for the following components:   WBC 16.3 (*)    RBC 5.15 (*)    Hemoglobin 10.8 (*)    MCV 73.4 (*)    MCH 21.0 (*)    MCHC 28.6 (*)    RDW 22.7 (*)    nRBC 0.3 (*)    Neutro Abs 12.2 (*)    Monocytes Absolute 1.4 (*)    Abs Immature Granulocytes 0.31 (*)    All other components within normal limits  URINALYSIS, ROUTINE W REFLEX MICROSCOPIC - Abnormal; Notable for the following components:   Glucose, UA 150 (*)    Ketones, ur 20 (*)    All other components within normal limits  CBG MONITORING, ED - Abnormal; Notable for the following components:   Glucose-Capillary 233 (*)    All other components within normal limits  CULTURE, BLOOD (ROUTINE X 2)  CULTURE, BLOOD (ROUTINE X 2)  URINE CULTURE  LACTIC ACID, PLASMA  LACTIC ACID, PLASMA  AMMONIA  POC SARS CORONAVIRUS 2 AG -  ED  I-STAT VENOUS BLOOD GAS, ED  EKG EKG Interpretation  Date/Time:  Monday May 29 2019 14:21:10 EST Ventricular Rate:  121 PR Interval:    QRS Duration: 88 QT Interval:  330 QTC Calculation: 469 R Axis:   31 Text Interpretation: Sinus tachycardia Low voltage, precordial leads Confirmed by Virgina Norfolk [id number]) on 05/29/2019 3:20:57 PM   Radiology Ct Head Wo Contrast  Result Date: 05/29/2019 CLINICAL DATA:  Altered level of consciousness. EXAM: CT HEAD WITHOUT CONTRAST TECHNIQUE: Contiguous axial  images were obtained from the base of the skull through the vertex without intravenous contrast. COMPARISON:  None. FINDINGS: Brain: No evidence of acute infarction, hemorrhage, hydrocephalus, extra-axial collection or mass lesion/mass effect. Vascular: No hyperdense vessel or unexpected calcification. Skull: Normal. Negative for fracture or focal lesion. Sinuses/Orbits: No acute finding. Other: None. IMPRESSION: Normal head CT. Electronically Signed   By: Lupita Raider M.D.   On: 05/29/2019 15:15    Procedures Procedures (including critical care time)  Medications Ordered in ED Medications  sodium chloride 0.9 % bolus 1,000 mL (1,000 mLs Intravenous New Bag/Given 05/29/19 1545)  ondansetron (ZOFRAN) injection 4 mg (4 mg Intravenous Given 05/29/19 1545)  gadobutrol (GADAVIST) 1 MMOL/ML injection 10 mL (10 mLs Intravenous Contrast Given 05/29/19 1710)     Initial Impression / Assessment and Plan / ED Course  I have reviewed the triage vital signs and the nursing notes.  Pertinent labs & imaging results that were available during my care of the patient were reviewed by me and considered in my medical decision making (see chart for details).     JENAYE RICKERT is a 68 year old female with history of diabetes, hypertension, remote seizures as a child who presents the ED with nausea, vomiting, possible change in her mental status from her facility.  Patient currently has been in a rehab facility for recent fracture of her arm and spine.  Overall history and physical are very difficult to obtain.  Patient appears to be having some nausea and some emesis.  She follows commands but she appears to be very uncomfortable.  She is unable to speak.  She is trying to mouth words to me.  She shakes her head no to chest pain, shortness of breath, abdominal pain.  She does not appear to be choking.  The main concern from facility is that maybe she was choking on something.  They are unable to provide much  history as patient is very new to facility.  Eventually got a hold of family who states that they talked her on the phone about 7 hours ago that she was complaining about having difficulty with eating.  She was unhappy with the care that she is being provided.  Facility states that patient was hit with the call bell very frequently.  Husband states that she could be trying to act out because she is unhappy about her care.  She has no history of stroke.  She used to have seizures as a kid but has not had a seizure in 30 years.  Overall patient appears to maybe have aphasia, may be neglect but overall she appears to have poor strength globally.  She appears intact give great effort.  CT scan showed no acute intracranial process.  She has contrast allergy therefore will get emergent MRI MRA of the head and neck to evaluate for stroke.  She has mild tachycardia but no fever.  Normal blood pressure.  Will initiate infectious work-up.  Overall could be stroke versus functional process versus infectious process.  She  appears to be alert and following commands.  Will reevaluate after MRI.  Husband states no history of food bolus.  She has some drooling on exam but there does not appear to be any signs of infection the back of her throat. Facility states she has been difficult to take care of, every time someone touches her she appears to have pain and dry heaves.  Covid is negative.  Patient has mild leukocytosis.  No significant anemia, electrolyte abnormality.  Urinalysis negative for infection. Patient signed out to Dr. Rhunette Croft with patient pending remaining lab work and MRI and re-evaluation. Please see his note for further results, eval and dispo.  This chart was dictated using voice recognition software.  Despite best efforts to proofread,  errors can occur which can change the documentation meaning.    Final Clinical Impressions(s) / ED Diagnoses   Final diagnoses:  Vomiting without nausea, intractability  of vomiting not specified, unspecified vomiting type  Altered mental status, unspecified altered mental status type    ED Discharge Orders    None       Virgina Norfolk, DO 05/29/19 1716

## 2019-05-29 NOTE — ED Notes (Signed)
Receiving nurse unable to receive report at this time, will call again.

## 2019-05-29 NOTE — Consult Note (Signed)
   [facility]  Impression: encephalopathy  Patient had a recent of fall with injury to her left upper extremity she was cared for required neurosurgical intervention was discharged is sent to rehabilitation with pain medications. Patient was then noted to have some nausea some vomiting later she was found at the rehabilitation to be less than responsive as she was brought to the emergency room where she was noted to have some agitation received one milligrams IV Ativan. Since that time patient is had some waxing and waning mental status but at the time of the consult she's had progressively improving mental status speaking in full sentences no slurred speech left upper extremity exam is limited due to pain from the previous fall and upper extremity injury. It is unclear what pain medications she received how much and when from the rehabilitation facility.    Recommendations:  -overall suspicion is that the patient had received some pain medications which were then associated with some G.I. upset and some transiently altered mental status. Cannot fully exclude that the patient didn't have either a seizure or a mini stroke. -MRI and MRA head and neck unremarkable. -Recommend admission to complete the TIA evaluation as well as obtain a UDS looking for pain medications which may have accounted for the episode additionally patient should continue to to clear to her neurologic baseline be stable overnight. Patient should also have an EEG and neurology consultation. -Recommend completing a TIA evaluation with TTE, A1c, lipids, telemetry. -Aspirin and ED  Belinda Fisher, MD [facility]  ---------------------------------------------------------------------  CC:  as above  History of Present Illness:  as above  Exam:  Mental Status:  awake somewhat drowsy and oriented   Cranial Nerves:  Pupils: Equal round and reactive to light Extraocular  movements: intact in all directions gaze  Motor Exam:  left upper extremity examination limited by pain, bilateral lower extremities minimally antigravity no cranial nerve deficits   Tremor/Abnormal Movements:  Resting tremor: Absent Intention tremor: Absent Postural tremor: Absent  Sensory Exam:   Light touch: Intact     Medical Decision Making:  - Extensive number of diagnosis or management options are considered above.   - Extensive amount of complex data reviewed.   - High risk of complication and/or morbidity or mortality are associated with differential diagnostic considerations above.  - There may be uncertain outcome and increased probability of prolonged functional impairment or high probability of severe prolonged functional impairment associated with some of these differential diagnosis.   Medical Data Reviewed:  1.Data reviewed include clinical labs, radiology,  Medical Tests;   2.Tests results discussed w/performing or interpreting physician;   3.Obtaining/reviewing old medical records;  4.Obtaining case history from another source;  5.Independent review of image, tracing or specimen.    Patient was informed the Neurology Consult would happen via Tele[facility] consult by way of interactive audio and video telecommunications and consented to receiving care in this manner.

## 2019-05-29 NOTE — ED Notes (Signed)
Pt unable to tolerate fluids at this time. Oral Asprin not given as result.

## 2019-05-29 NOTE — ED Notes (Signed)
Unable to thoroughly complete triage due to pt being nonverbal and facility being unable to provide orientation status

## 2019-05-29 NOTE — ED Notes (Signed)
Stuck 2x unable to collect further labs. Pt being transported to emergent MRI. With consult phlebotomy on return.

## 2019-05-29 NOTE — ED Notes (Signed)
Patient transported to MRI 

## 2019-05-30 ENCOUNTER — Other Ambulatory Visit: Payer: Self-pay

## 2019-05-30 ENCOUNTER — Observation Stay (HOSPITAL_BASED_OUTPATIENT_CLINIC_OR_DEPARTMENT_OTHER): Payer: Medicare Other

## 2019-05-30 ENCOUNTER — Observation Stay (HOSPITAL_COMMUNITY)
Admission: EM | Admit: 2019-05-30 | Discharge: 2019-05-30 | Disposition: A | Discharge status: Home / Self Care | Payer: Medicare Other | Source: Home / Self Care | Attending: Neurology | Admitting: Neurology

## 2019-05-30 ENCOUNTER — Encounter (HOSPITAL_COMMUNITY): Payer: Self-pay | Admitting: Internal Medicine

## 2019-05-30 DIAGNOSIS — I10 Essential (primary) hypertension: Secondary | ICD-10-CM | POA: Diagnosis not present

## 2019-05-30 DIAGNOSIS — G459 Transient cerebral ischemic attack, unspecified: Secondary | ICD-10-CM | POA: Diagnosis not present

## 2019-05-30 DIAGNOSIS — R4182 Altered mental status, unspecified: Secondary | ICD-10-CM

## 2019-05-30 DIAGNOSIS — E1165 Type 2 diabetes mellitus with hyperglycemia: Secondary | ICD-10-CM | POA: Diagnosis present

## 2019-05-30 DIAGNOSIS — G9340 Encephalopathy, unspecified: Secondary | ICD-10-CM | POA: Diagnosis not present

## 2019-05-30 LAB — GLUCOSE, CAPILLARY
Glucose-Capillary: 112 mg/dL — ABNORMAL HIGH (ref 70–99)
Glucose-Capillary: 174 mg/dL — ABNORMAL HIGH (ref 70–99)
Glucose-Capillary: 175 mg/dL — ABNORMAL HIGH (ref 70–99)
Glucose-Capillary: 178 mg/dL — ABNORMAL HIGH (ref 70–99)
Glucose-Capillary: 180 mg/dL — ABNORMAL HIGH (ref 70–99)
Glucose-Capillary: 184 mg/dL — ABNORMAL HIGH (ref 70–99)
Glucose-Capillary: 200 mg/dL — ABNORMAL HIGH (ref 70–99)

## 2019-05-30 LAB — CBC WITH DIFFERENTIAL/PLATELET
Abs Immature Granulocytes: 0.19 10*3/uL — ABNORMAL HIGH (ref 0.00–0.07)
Basophils Absolute: 0.1 10*3/uL (ref 0.0–0.1)
Basophils Relative: 1 %
Eosinophils Absolute: 0.5 10*3/uL (ref 0.0–0.5)
Eosinophils Relative: 4 %
HCT: 38.7 % (ref 36.0–46.0)
Hemoglobin: 10.5 g/dL — ABNORMAL LOW (ref 12.0–15.0)
Immature Granulocytes: 1 %
Lymphocytes Relative: 14 %
Lymphs Abs: 1.8 10*3/uL (ref 0.7–4.0)
MCH: 20.8 pg — ABNORMAL LOW (ref 26.0–34.0)
MCHC: 27.1 g/dL — ABNORMAL LOW (ref 30.0–36.0)
MCV: 76.8 fL — ABNORMAL LOW (ref 80.0–100.0)
Monocytes Absolute: 1.1 10*3/uL — ABNORMAL HIGH (ref 0.1–1.0)
Monocytes Relative: 8 %
Neutro Abs: 9.5 10*3/uL — ABNORMAL HIGH (ref 1.7–7.7)
Neutrophils Relative %: 72 %
Platelets: 294 10*3/uL (ref 150–400)
RBC: 5.04 MIL/uL (ref 3.87–5.11)
RDW: 23.6 % — ABNORMAL HIGH (ref 11.5–15.5)
WBC: 13.2 10*3/uL — ABNORMAL HIGH (ref 4.0–10.5)
nRBC: 0.4 % — ABNORMAL HIGH (ref 0.0–0.2)

## 2019-05-30 LAB — CREATININE, SERUM
Creatinine, Ser: 0.76 mg/dL (ref 0.44–1.00)
GFR calc Af Amer: >60 mL/min (ref >60)
GFR calc non Af Amer: >60 mL/min (ref >60)

## 2019-05-30 LAB — COMPREHENSIVE METABOLIC PANEL
ALT: 22 U/L (ref 0–44)
AST: 23 U/L (ref 15–41)
Albumin: 3.3 g/dL — ABNORMAL LOW (ref 3.5–5.0)
Alkaline Phosphatase: 92 U/L (ref 38–126)
Anion gap: 12 (ref 5–15)
BUN: 12 mg/dL (ref 8–23)
CO2: 21 mmol/L — ABNORMAL LOW (ref 22–32)
Calcium: 9 mg/dL (ref 8.9–10.3)
Chloride: 105 mmol/L (ref 98–111)
Creatinine, Ser: 0.74 mg/dL (ref 0.44–1.00)
GFR calc Af Amer: >60 mL/min (ref >60)
GFR calc non Af Amer: >60 mL/min (ref >60)
Glucose, Bld: 189 mg/dL — ABNORMAL HIGH (ref 70–99)
Potassium: 4.2 mmol/L (ref 3.5–5.1)
Sodium: 138 mmol/L (ref 135–145)
Total Bilirubin: 0.9 mg/dL (ref 0.3–1.2)
Total Protein: 7.1 g/dL (ref 6.5–8.1)

## 2019-05-30 LAB — SARS CORONAVIRUS 2 (TAT 6-24 HRS): SARS Coronavirus 2: NEGATIVE

## 2019-05-30 LAB — MRSA PCR SCREENING: MRSA by PCR: NEGATIVE

## 2019-05-30 LAB — URINE CULTURE: Culture: NO GROWTH

## 2019-05-30 MED ORDER — ACETAMINOPHEN 650 MG RE SUPP: 650.0000 mg | Freq: Four times a day (QID) | RECTAL | Status: DC | PRN

## 2019-05-30 MED ORDER — INSULIN DETEMIR 100 UNIT/ML ~~LOC~~ SOLN
15.0000 [IU] | Freq: Every day | SUBCUTANEOUS | Status: DC
  Administered 2019-05-30 (×2): 15 [IU] via SUBCUTANEOUS
  Filled 2019-05-30 (×3): qty 0.15

## 2019-05-30 MED ORDER — LIDOCAINE 5 % EX PTCH
1.0000 | MEDICATED_PATCH | Freq: Every day | CUTANEOUS | Status: DC
  Administered 2019-05-30 – 2019-05-31 (×2): 1 via TRANSDERMAL
  Filled 2019-05-30 (×2): qty 1

## 2019-05-30 MED ORDER — HYDRALAZINE HCL 20 MG/ML IJ SOLN: 10.0000 mg | INTRAMUSCULAR | Status: DC | PRN

## 2019-05-30 MED ORDER — HEPARIN SODIUM (PORCINE) 5000 UNIT/ML IJ SOLN
5000.0000 [IU] | Freq: Three times a day (TID) | INTRAMUSCULAR | Status: DC
  Administered 2019-05-30 – 2019-05-31 (×4): 5000 [IU] via SUBCUTANEOUS
  Filled 2019-05-30 (×5): qty 1

## 2019-05-30 MED ORDER — ONDANSETRON HCL 4 MG PO TABS: 4.0000 mg | ORAL_TABLET | Freq: Four times a day (QID) | ORAL | Status: DC | PRN

## 2019-05-30 MED ORDER — ONDANSETRON HCL 4 MG/2ML IJ SOLN: 4.0000 mg | Freq: Four times a day (QID) | INTRAMUSCULAR | Status: DC | PRN

## 2019-05-30 MED ORDER — CHLORHEXIDINE GLUCONATE CLOTH 2 % EX PADS
6.0000 | MEDICATED_PAD | Freq: Every day | CUTANEOUS | Status: DC
  Administered 2019-05-30: 11:00:00 6 via TOPICAL

## 2019-05-30 MED ORDER — INSULIN ASPART 100 UNIT/ML ~~LOC~~ SOLN
0.0000 [IU] | SUBCUTANEOUS | Status: DC
  Administered 2019-05-30 (×2): 2 [IU] via SUBCUTANEOUS
  Administered 2019-05-30: 1 [IU] via SUBCUTANEOUS
  Administered 2019-05-30 – 2019-05-31 (×4): 2 [IU] via SUBCUTANEOUS
  Administered 2019-05-31: 12:00:00 3 [IU] via SUBCUTANEOUS
  Filled 2019-05-30: qty 0.09

## 2019-05-30 MED ORDER — SODIUM CHLORIDE 0.9 % IV SOLN
INTRAVENOUS | Status: AC
  Administered 2019-05-30 (×2): via INTRAVENOUS

## 2019-05-30 MED ORDER — ACETAMINOPHEN 325 MG PO TABS
650.0000 mg | ORAL_TABLET | Freq: Four times a day (QID) | ORAL | Status: DC | PRN
  Administered 2019-05-30: 11:00:00 650 mg via ORAL
  Filled 2019-05-30 (×2): qty 2

## 2019-05-30 MED ORDER — SODIUM CHLORIDE 0.9% FLUSH
9.0000 mL | INTRAVENOUS | Status: DC | PRN
  Administered 2019-05-30: 15:00:00 9 mL via INTRAVENOUS
  Filled 2019-05-30: qty 9

## 2019-05-30 NOTE — Progress Notes (Signed)
EEG complete - results pending 

## 2019-05-30 NOTE — Progress Notes (Signed)
Patient was admitted early morning with acute encephalopathy.  This was thought to be either due to pain medications or due to seizure.  Neurology was consulted.  Patient underwent EEG per their recommendations.  EEG unremarkable for any seizure activity.  Patient seen and examined in the ICU.  She is completely alert and oriented at this point in time and had no complaint.  On exam lungs are clear to auscultation.  Abdomen soft and nontender.  No focal neurological deficit.  Patient has hard time lifting her left upper extremity due to recent shoulder fracture.  She had sling in the left upper extremity.  I will consult PT OT to assess her.  Hold pain medications.  Reassess tomorrow morning.

## 2019-05-30 NOTE — Progress Notes (Signed)
  Echocardiogram 2D Echocardiogram has been performed.  Darlina Sicilian M 05/30/2019, 2:54 PM

## 2019-05-30 NOTE — Procedures (Signed)
Patient Name: Kelly Richardson  MRN: [id number]  Epilepsy Attending: Lora Havens  Referring Physician/Provider: Dr. Gean Birchwood Date: 05/30/2019 Duration: 26.22 minutes  Patient history: 68 year old female with altered mental status.  EEG to evaluate for seizures.  Level of alertness: Awake  AEDs during EEG study: None  Technical aspects: This EEG study was done with scalp electrodes positioned according to the 10-20 International system of electrode placement. Electrical activity was acquired at a sampling rate of 500Hz  and reviewed with a high frequency filter of 70Hz  and a low frequency filter of 1Hz . EEG data were recorded continuously and digitally stored.   DESCRIPTION:  The posterior dominant rhythm consists of 9-10 Hz activity of moderate voltage (25-35 uV) seen predominantly in posterior head regions, symmetric and reactive to eye opening and eye closing.        Hyperventilation and photic stimulation were not performed.  IMPRESSION: This study is within normal limits. No seizures or epileptiform discharges were seen throughout the recording.  Millianna Szymborski Barbra Sarks

## 2019-05-30 NOTE — H&P (Addendum)
History and Physical    Kelly Richardson:[id number] DOB: Nov 10, 1950 DOA: 05/29/2019  PCP: Iona Hansen, NP  Patient coming from: Skilled nursing facility.  Chief Complaint: Increasing confusion vomiting.  History obtained from ER physician.  HPI: Kelly Richardson is a 68 y.o. female with history of diabetes mellitus type 2, hypertension previous history of seizures was brought to the ER after patient was found to be increasing confusion and having nausea vomiting.  As per the ER physician who discussed with the patient husband and the facility where patient came from patient has been having increasing confusion since morning and also had a couple of episodes of vomiting.  Not complaining of any chest pain shortness of breath abdominal pain.  Because patient became more confused patient was brought to the ER.  Patient was recently admitted for left shoulder fracture and orthopedics recommended outpatient work-up.  Patient was discharged to skilled nursing facility on May 26, 2019.  ED Course: In the ER patient was found to be confused with some difficulty speaking and there also was some concern for choking.  Initially patient was agitated required Ativan.  Tele neurology was consulted requested to get work-up for possible TIA and at this time neurologist thinks that patient symptoms may be from possible pain relief medication.  And MRI brain was done which did not show any acute except for nonspecific findings in the cervical spine.  MRA of the head and neck also was done which was showing nonspecific findings.  Labs showed normal creatinine, ammonia, LFTs platelets blood glucose of 226 anion gap 14 hemoglobin was around and WBC count of 16.3.  Lactic acid was around 1.9.  Chest x-ray UA unremarkable.  EKG was showing sinus tachycardia.  At the time of my exam patient is unresponsive to deep painful stimuli when patient gets up and tries to answer.  Review of Systems: As per HPI, rest  all negative.   Past Medical History:  Diagnosis Date   Arthritis    Complication of anesthesia    Diabetes mellitus without complication (HCC)    Heart murmur    as a child   Hypertension    PONV (postoperative nausea and vomiting)    Seizures (HCC)    Shortness of breath dyspnea    sometimes  at night    Past Surgical History:  Procedure Laterality Date   ABDOMINAL HYSTERECTOMY     CHOLECYSTECTOMY  1995   COLONOSCOPY     EXCISIONAL TOTAL HIP ARTHROPLASTY WITH ANTIBIOTIC SPACERS Right 09/07/2014   Procedure: Excision Total Hip Arthroplasty, Place Antibiotic Spacer;  Surgeon: Nadara Mustard, MD;  Location: [facility] OR;  Service: Orthopedics;  Laterality: Right;   INCISION AND DRAINAGE HIP Right 06/08/2014   Procedure: Irrigation and Debridement Right Hip, Revision of Cup and Head of Total Hip with  Placement  of Antibiotic Spacer;  Surgeon: Nadara Mustard, MD;  Location: [facility] OR;  Service: Orthopedics;  Laterality: Right;   TONSILLECTOMY     TOTAL HIP ARTHROPLASTY Right 2011   TOTAL HIP REVISION Right 11/09/2014   Procedure: TOTAL HIP REVISION;  Surgeon: Nadara Mustard, MD;  Location: [facility] OR;  Service: Orthopedics;  Laterality: Right;     reports that she has never smoked. She has never used smokeless tobacco. She reports that she does not drink alcohol or use drugs.  Allergies  Allergen Reactions   Metformin Other (See Comments)    REACTION: faintness, DIZZINESS   Penicillins Anaphylaxis   Seldane [  Terfenadine] Other (See Comments)    N/V, increased heart rate, seizures, hallinations.   Sulfonamide Derivatives Anaphylaxis   Vicks Formula 44 Cough Relief [Dextromethorphan Hbr] Other (See Comments)    Runs blood pressure up    Xanax [Alprazolam] Other (See Comments)    Pt does not want Xanax--history of addiction.   Iohexol Other (See Comments)     Desc: pt states she had a ct in [place] in 2000 and immediately experienced profuse vomiting and sweating.  She said they  treated her like she was having a reaction and doesn't remember much else or if meds were given., Onset Date: [id number]    Latex Other (See Comments)    BLISTERING   Other Nausea And Vomiting    "Anything I have ever been given more nausea just increases it."   Percocet [Oxycodone-Acetaminophen] Other (See Comments)    Hallucinations    History reviewed. No pertinent family history.  Prior to Admission medications   Medication Sig Start Date End Date Taking? Authorizing Provider  acetaminophen (TYLENOL) 325 MG tablet Take 650 mg by mouth every 6 (six) hours as needed for mild pain.   Yes [provider]  aspirin 81 MG chewable tablet Chew 81 mg by mouth every evening.    Yes [provider]  atorvastatin (LIPITOR) 20 MG tablet Take 20 mg by mouth every evening.  03/11/19  Yes [provider]  ferrous sulfate 325 (65 FE) MG tablet Take 1 tablet (325 mg total) by mouth daily with breakfast. 05/27/19  Yes Adhikari, Amrit, MD  insulin aspart (NOVOLOG FLEXPEN) 100 UNIT/ML FlexPen Inject 15 Units as directed 3 (three) times daily with meals. Per sliding scale and not to exceed 81 units daily.  04/05/19  Yes [provider]  Insulin Detemir (LEVEMIR FLEXTOUCH) 100 UNIT/ML Pen Inject 40 Units into the skin at bedtime. 12/09/16  Yes Pricilla Loveless, MD  lidocaine (LIDODERM) 5 % Place 1 patch onto the skin daily. Remove & Discard patch within 12 hours or as directed by MD 05/18/19  Yes Alvira Monday, MD  lisinopril (PRINIVIL,ZESTRIL) 2.5 MG tablet Take 2.5 mg by mouth every evening.  06/24/17  Yes [provider]  ondansetron (ZOFRAN) 4 MG tablet Take 4 mg by mouth every 8 (eight) hours as needed for nausea or vomiting.   Yes [provider]  traMADol (ULTRAM) 50 MG tablet Take 1 tablet (50 mg total) by mouth every 6 (six) hours as needed. Patient taking differently: Take 50 mg by mouth every 6 (six) hours as needed for moderate pain.  05/26/19  Yes  Burnadette Pop, MD  ACCU-CHEK AVIVA PLUS test strip 1 each by Other route daily. 05/02/19   [provider]  Insulin Pen Needle (FIFTY50 PEN NEEDLES) 31G X 8 MM MISC 1 each by Other route daily. 02/15/18   [provider]    Physical Exam: Constitutional: Moderately built and nourished. Vitals:   05/29/19 2130 05/29/19 2200 05/29/19 2230 05/29/19 2300  BP: (!) 154/92 138/81 125/71 134/69  Pulse: (!) 110 (!) 105 (!) 106 (!) 102  Resp: (!) 21 (!) 23 (!) 23 19  Temp:      SpO2: 97% 95% 94% 94%   Eyes: Anicteric no pallor. ENMT: No discharge from the ears eyes nose or mouth. Neck: No mass felt.  No neck rigidity. Respiratory: No rhonchi or crepitations. Cardiovascular: S1-S2 heard. Abdomen: Soft nontender bowel sounds present but no guarding or rigidity. Musculoskeletal: No edema.  Left upper extremity in the  sling. Skin: No rash. Neurologic: Patient is only arousable with deep painful stimuli at that time patient is back even answering questions.  And moving all extremities.  Pupils are equal and reacting to light. Psychiatric: Only responsive to deep painful stimuli.   Labs on Admission: I have personally reviewed following labs and imaging studies  CBC: Recent Labs  Lab 05/23/19 0451 05/24/19 0815 05/25/19 0532 05/29/19 1437  WBC 16.6* 11.5* 11.8* 16.3*  NEUTROABS  --  7.0 7.3 12.2*  HGB 11.8* 9.0* 8.9* 10.8*  HCT 43.8 34.1* 32.2* 37.8  MCV 73.5* 75.4* 72.9* 73.4*  PLT 364 240 291 391   Basic Metabolic Panel: Recent Labs  Lab 05/23/19 0451 05/29/19 1437  NA 135 136  K 4.3 4.3  CL 104 103  CO2 18* 19*  GLUCOSE 282* 226*  BUN 17 12  CREATININE 0.84 0.86  CALCIUM 8.9 9.2   GFR: Estimated Creatinine Clearance: 71.1 mL/min (by C-G formula based on SCr of 0.86 mg/dL). Liver Function Tests: Recent Labs  Lab 05/29/19 1437  AST 23  ALT 24  ALKPHOS 93  BILITOT 1.4*  PROT 7.3  ALBUMIN 3.7   No results for input(s): LIPASE, AMYLASE in the  last 168 hours. Recent Labs  Lab 05/29/19 1439  AMMONIA 11   Coagulation Profile: No results for input(s): INR, PROTIME in the last 168 hours. Cardiac Enzymes: No results for input(s): CKTOTAL, CKMB, CKMBINDEX, TROPONINI in the last 168 hours. BNP (last 3 results) No results for input(s): PROBNP in the last 8760 hours. HbA1C: Recent Labs    05/29/19 1437  HGBA1C 9.5*   CBG: Recent Labs  Lab 05/25/19 2034 05/26/19 0749 05/26/19 1206 05/29/19 1419 05/29/19 2311  GLUCAP 265* 136* 185* 233* 184*   Lipid Profile: Recent Labs    05/29/19 1437  CHOL 128  HDL 42  LDLCALC 70  TRIG 79  CHOLHDL 3.0   Thyroid Function Tests: No results for input(s): TSH, T4TOTAL, FREET4, T3FREE, THYROIDAB in the last 72 hours. Anemia Panel: No results for input(s): VITAMINB12, FOLATE, FERRITIN, TIBC, IRON, RETICCTPCT in the last 72 hours. Urine analysis:    Component Value Date/Time   COLORURINE YELLOW 05/29/2019 1437   APPEARANCEUR CLEAR 05/29/2019 1437   LABSPEC 1.015 05/29/2019 1437   PHURINE 5.0 05/29/2019 1437   GLUCOSEU 150 (A) 05/29/2019 1437   GLUCOSEU NEGATIVE 07/26/2006 1044   HGBUR NEGATIVE 05/29/2019 1437   BILIRUBINUR NEGATIVE 05/29/2019 1437   KETONESUR 20 (A) 05/29/2019 1437   PROTEINUR NEGATIVE 05/29/2019 1437   UROBILINOGEN 0.2 09/08/2012 1444   NITRITE NEGATIVE 05/29/2019 1437   LEUKOCYTESUR NEGATIVE 05/29/2019 1437   Sepsis Labs: (procalcitonin:4,lacticidven:4) ) Recent Results (from the past 240 hour(s))  SARS CORONAVIRUS 2 (TAT 6-24 HRS) Nasopharyngeal Nasopharyngeal Swab     Status: None   Collection Time: 05/23/19 12:43 AM   Specimen: Nasopharyngeal Swab  Result Value Ref Range Status   SARS Coronavirus 2 NEGATIVE NEGATIVE Final    Comment: (NOTE) SARS-CoV-2 target nucleic acids are NOT DETECTED. The SARS-CoV-2 RNA is generally detectable in upper and lower respiratory specimens during the acute phase of infection. Negative results do not  preclude SARS-CoV-2 infection, do not rule out co-infections with other pathogens, and should not be used as the sole basis for treatment or other patient management decisions. Negative results must be combined with clinical observations, patient history, and epidemiological information. The expected result is Negative. Fact Sheet for Patients: HairSlick.no Fact Sheet for Healthcare Providers: quierodirigir.com This test is not yet  approved or cleared by the [place] FDA and  has been authorized for detection and/or diagnosis of SARS-CoV-2 by FDA under an Emergency Use Authorization (EUA). This EUA will remain  in effect (meaning this test can be used) for the duration of the COVID-19 declaration under Section 56 4(b)(1) of the Act, 21 U.S.C. section 360bbb-3(b)(1), unless the authorization is terminated or revoked sooner. Performed at [facility], [address] [place], [place] [address]      Radiological Exams on Admission: Ct Head Wo Contrast  Result Date: 05/29/2019 CLINICAL DATA:  Altered level of consciousness. EXAM: CT HEAD WITHOUT CONTRAST TECHNIQUE: Contiguous axial images were obtained from the base of the skull through the vertex without intravenous contrast. COMPARISON:  None. FINDINGS: Brain: No evidence of acute infarction, hemorrhage, hydrocephalus, extra-axial collection or mass lesion/mass effect. Vascular: No hyperdense vessel or unexpected calcification. Skull: Normal. Negative for fracture or focal lesion. Sinuses/Orbits: No acute finding. Other: None. IMPRESSION: Normal head CT. Electronically Signed   By: Lupita RaiderJames  Green Jr M.D.   On: 05/29/2019 15:15   Mr Angio Head Wo Contrast  Result Date: 05/29/2019 CLINICAL DATA:  Focal neuro deficit, greater than 6 hours, stroke suspected. Evaluate for CVA. Additional history provided: Altered mental status. Nonverbal. Acute change. EXAM: MRI HEAD WITHOUT  CONTRAST MRA HEAD WITHOUT CONTRAST TECHNIQUE: Multiplanar, multiecho pulse sequences of the brain and surrounding structures were obtained without intravenous contrast. Angiographic images of the head were obtained using MRA technique without contrast. COMPARISON:  Head CT performed earlier the same day 05/29/2019 FINDINGS: MRI HEAD FINDINGS Brain: There is no evidence of acute infarct. No evidence of intracranial mass. No midline shift or extra-axial fluid collection. No chronic intracranial blood products. Mild scattered T2/FLAIR hyperintensity within the cerebral white matter is nonspecific, but consistent with chronic small vessel ischemic disease. There are incidentally noted developmental venous anomalies within the bilateral cerebellar hemispheres. No other abnormal intracranial enhancement is demonstrated. Vascular: There is precontrast intravascular T1 hyperintensity. Skull and upper cervical spine: No focal marrow lesion. Abnormal T1 hypointensity within the visualized upper cervical spine. Sinuses/Orbits: Bilateral lens replacements. No significant paranasal sinus disease or mastoid effusion. MRA HEAD FINDINGS The intracranial internal carotid arteries are patent without significant stenosis. The bilateral middle and anterior cerebral arteries are patent without significant proximal stenosis. The intracranial vertebral arteries are patent without significant proximal stenosis, as is the basilar artery. The bilateral posterior cerebral arteries are patent without significant proximal stenosis. No intracranial aneurysm is identified. IMPRESSION: MRI brain: 1. No evidence of acute intracranial abnormality, including acute infarct. 2. Mild chronic small vessel ischemic disease. 3. Precontrast intravascular T1 hyperintensity. Correlate for recent iron infusions. 4. Abnormal T1 hypointensity within the visualized upper cervical spine. This finding is nonspecific, but commonly seen in the setting of chronic  anemia. MRA head: No intracranial large vessel occlusion or proximal high-grade arterial stenosis. Electronically Signed   By: Jackey LogeKyle  Golden DO   On: 05/29/2019 17:27   Mr Angiogram Neck W Or Wo Contrast  Result Date: 05/29/2019 CLINICAL DATA:  Focal neuro deficit, greater than 6 hours, stroke suspected. EXAM: MRA NECK WITHOUT AND WITH CONTRAST TECHNIQUE: Multiplanar and multiecho pulse sequences of the neck were obtained without and with intravenous contrast. Angiographic images of the neck were obtained using MRA technique without and with intravenous contrast. CONTRAST:  10mL GADAVIST GADOBUTROL 1 MMOL/ML IV SOLN COMPARISON:  No pertinent prior studies available for comparison. FINDINGS: The examination is slightly limited at the level of the  lower neck due to cardiac and respiratory motion artifact. Standard aortic branching. There is irregular and diminished enhancement within the proximal to mid right common carotid artery, likely at least partially artifactual. A true stenosis at this site is difficult to exclude. The mid/distal right common carotid artery and cervical right ICA are patent without significant stenosis (50% or greater). The left common and cervical internal carotid arteries are patent without significant stenosis (50% or greater). Codominant vertebral arteries. Narrowing at the origin of the right vertebral artery with suspected at least moderate stenosis at this site. Mild/moderate narrowing at the origin of the left vertebral artery. The vertebral arteries are otherwise patent throughout the neck without significant stenosis (50% or greater). IMPRESSION: Examination somewhat limited at the level of the lower neck due to cardiac and respiratory motion artifact. Irregular and diminished enhancement within the proximal/mid right common carotid artery, which is likely at least partially artifactual. A true stenosis at this site is difficult to exclude. The mid/distal right common carotid  artery and cervical ICA are patent without significant stenosis. The left common and cervical internal carotid arteries are patent without significant stenosis. The vertebral arteries are patent bilaterally. Stenoses at the bilateral vertebral artery origins, suspected at least moderate on the right, mild/moderate on the left. Electronically Signed   By: Kellie Simmering DO   On: 05/29/2019 17:45   Mr Brain W Wo Contrast  Result Date: 05/29/2019 CLINICAL DATA:  Focal neuro deficit, greater than 6 hours, stroke suspected. Evaluate for CVA. Additional history provided: Altered mental status. Nonverbal. Acute change. EXAM: MRI HEAD WITHOUT CONTRAST MRA HEAD WITHOUT CONTRAST TECHNIQUE: Multiplanar, multiecho pulse sequences of the brain and surrounding structures were obtained without intravenous contrast. Angiographic images of the head were obtained using MRA technique without contrast. COMPARISON:  Head CT performed earlier the same day 05/29/2019 FINDINGS: MRI HEAD FINDINGS Brain: There is no evidence of acute infarct. No evidence of intracranial mass. No midline shift or extra-axial fluid collection. No chronic intracranial blood products. Mild scattered T2/FLAIR hyperintensity within the cerebral white matter is nonspecific, but consistent with chronic small vessel ischemic disease. There are incidentally noted developmental venous anomalies within the bilateral cerebellar hemispheres. No other abnormal intracranial enhancement is demonstrated. Vascular: There is precontrast intravascular T1 hyperintensity. Skull and upper cervical spine: No focal marrow lesion. Abnormal T1 hypointensity within the visualized upper cervical spine. Sinuses/Orbits: Bilateral lens replacements. No significant paranasal sinus disease or mastoid effusion. MRA HEAD FINDINGS The intracranial internal carotid arteries are patent without significant stenosis. The bilateral middle and anterior cerebral arteries are patent without  significant proximal stenosis. The intracranial vertebral arteries are patent without significant proximal stenosis, as is the basilar artery. The bilateral posterior cerebral arteries are patent without significant proximal stenosis. No intracranial aneurysm is identified. IMPRESSION: MRI brain: 1. No evidence of acute intracranial abnormality, including acute infarct. 2. Mild chronic small vessel ischemic disease. 3. Precontrast intravascular T1 hyperintensity. Correlate for recent iron infusions. 4. Abnormal T1 hypointensity within the visualized upper cervical spine. This finding is nonspecific, but commonly seen in the setting of chronic anemia. MRA head: No intracranial large vessel occlusion or proximal high-grade arterial stenosis. Electronically Signed   By: Kellie Simmering DO   On: 05/29/2019 17:27   Dg Chest Port 1 View  Result Date: 05/29/2019 CLINICAL DATA:  Fever and vomiting. EXAM: PORTABLE CHEST 1 VIEW COMPARISON:  May 23, 2019 FINDINGS: The heart size is mildly enlarged. Again noted is a fracture of the proximal left  humerus. There is no pneumothorax. No large pleural effusion. No focal infiltrate. Aortic calcifications are noted. The lung volumes are somewhat low. IMPRESSION: No active disease. Electronically Signed   By: Katherine Mantle M.D.   On: 05/29/2019 17:29    EKG: Independently reviewed.  Sinus tachycardia.  Assessment/Plan Principal Problem:   Acute encephalopathy Active Problems:   HTN (hypertension)   Anemia due to blood loss   Closed lumbar vertebral fracture (HCC)   Uncontrolled type 2 diabetes mellitus with hyperglycemia (HCC)    1. Acute encephalopathy -tele neurologist at this time think that patient symptoms may be related to pain relief medication.  However has recommended EEG.  I discussed with Dr. Georgiana Spinner Aroor on-call neurologist who at this time recommended getting EEG and closely monitoring.  No definite signs of any active seizures going on because  patient is easily arousable with deep painful stimuli and answers questions. 2. Hypertension we will keep patient on as needed IV hydralazine. 3. Diabetes mellitus type 2 we will decrease Levemir dose and keep patient on sliding scale coverage. 4. Anemia likely from blood loss.  Follow CBC. 5. Recent left shoulder fracture to be followed as outpatient with orthopedics.  For now keep patient off pain medication due to #1.  COVID-19 test is pending.  MRA of the neck was showing nonspecific findings in the right carotid which I discussed with Dr. Laurence Slate on-call neurologist who at this time feels findings were nonspecific.  Could be from artifacts.   DVT prophylaxis: Heparin. Code Status: Full code. Family Communication: Will need to discuss with patient's husband. Disposition Plan: To be determined. Consults called: Discussed with neurologist. Admission status: Observation.   Eduard Clos MD [facility] Pager [phone].  If 7PM-7AM, please contact night-coverage [email or web address] Password [facility]  05/30/2019, 12:17 AM

## 2019-05-31 DIAGNOSIS — I10 Essential (primary) hypertension: Secondary | ICD-10-CM | POA: Diagnosis not present

## 2019-05-31 LAB — CBC WITH DIFFERENTIAL/PLATELET
Abs Immature Granulocytes: 0.11 10*3/uL — ABNORMAL HIGH (ref 0.00–0.07)
Basophils Absolute: 0 10*3/uL (ref 0.0–0.1)
Basophils Relative: 0 %
Eosinophils Absolute: 0.8 10*3/uL — ABNORMAL HIGH (ref 0.0–0.5)
Eosinophils Relative: 8 %
HCT: 32.3 % — ABNORMAL LOW (ref 36.0–46.0)
Hemoglobin: 8.8 g/dL — ABNORMAL LOW (ref 12.0–15.0)
Immature Granulocytes: 1 %
Lymphocytes Relative: 19 %
Lymphs Abs: 2 10*3/uL (ref 0.7–4.0)
MCH: 21 pg — ABNORMAL LOW (ref 26.0–34.0)
MCHC: 27.2 g/dL — ABNORMAL LOW (ref 30.0–36.0)
MCV: 76.9 fL — ABNORMAL LOW (ref 80.0–100.0)
Monocytes Absolute: 0.8 10*3/uL (ref 0.1–1.0)
Monocytes Relative: 8 %
Neutro Abs: 6.7 10*3/uL (ref 1.7–7.7)
Neutrophils Relative %: 64 %
Platelets: 267 10*3/uL (ref 150–400)
RBC: 4.2 MIL/uL (ref 3.87–5.11)
RDW: 23.9 % — ABNORMAL HIGH (ref 11.5–15.5)
WBC: 10.4 10*3/uL (ref 4.0–10.5)
nRBC: 0 % (ref 0.0–0.2)

## 2019-05-31 LAB — GLUCOSE, CAPILLARY
Glucose-Capillary: 155 mg/dL — ABNORMAL HIGH (ref 70–99)
Glucose-Capillary: 120 mg/dL — ABNORMAL HIGH (ref 70–99)
Glucose-Capillary: 246 mg/dL — ABNORMAL HIGH (ref 70–99)

## 2019-05-31 LAB — BASIC METABOLIC PANEL
Anion gap: 7 (ref 5–15)
BUN: 14 mg/dL (ref 8–23)
CO2: 22 mmol/L (ref 22–32)
Calcium: 8.5 mg/dL — ABNORMAL LOW (ref 8.9–10.3)
Chloride: 111 mmol/L (ref 98–111)
Creatinine, Ser: 0.59 mg/dL (ref 0.44–1.00)
GFR calc Af Amer: >60 mL/min (ref >60)
GFR calc non Af Amer: >60 mL/min (ref >60)
Glucose, Bld: 148 mg/dL — ABNORMAL HIGH (ref 70–99)
Potassium: 3.2 mmol/L — ABNORMAL LOW (ref 3.5–5.1)
Sodium: 140 mmol/L (ref 135–145)

## 2019-05-31 MED ORDER — POTASSIUM CHLORIDE CRYS ER 20 MEQ PO TBCR
40.0000 meq | EXTENDED_RELEASE_TABLET | ORAL | Status: DC
  Administered 2019-05-31: 40 meq via ORAL
  Filled 2019-05-31: qty 2

## 2019-05-31 NOTE — Progress Notes (Signed)
PT Cancellation Note  Patient Details Name: Kelly Richardson MRN: [id number] DOB: 1950-09-15   Cancelled Treatment:     Pt already d/c back to SNF prior to PT evaluation.    Mikael Spray Djeneba Barsch 05/31/2019, 2:28 PM

## 2019-05-31 NOTE — Care Management Obs Status (Signed)
Major NOTIFICATION   Patient Details  Name: Kelly Richardson MRN: [id number] Date of Birth: [birth year]   Medicare Observation Status Notification Given:  Yes    Trish Mage, LCSW 05/31/2019, 9:20 AM

## 2019-05-31 NOTE — Discharge Summary (Signed)
Physician Discharge Summary  Kelly Richardson RUE:[id number] DOB: 01-Nov-1950 DOA: 05/29/2019  PCP: Iona Hansen, NP  Admit date: 05/29/2019 Discharge date: 05/31/2019  Admitted From: [facility] Disposition: [facility]  Recommendations for Outpatient Follow-up:  1. Follow up with PCP in 1-2 weeks 2. Please obtain BMP/CBC in one week 3. Please follow up on the following pending results:  Home Health: None Equipment/Devices: None  Discharge Condition: Stable CODE STATUS: Full code Diet recommendation: Cardiac  Subjective: Seen and examined.  Alert and oriented.  Complains of left shoulder pain.  No other complaint.  Brief/Interim Summary: Kelly Richardson is a 68 y.o. female with history of diabetes mellitus type 2, hypertension previous history of seizures was brought to the ER after patient was found to be increasing confusion and having nausea vomiting.  As per the ER physician who discussed with the patient husband and the facility where patient came from patient has been having increasing confusion since morning and also had a couple of episodes of vomiting.  Not complaining of any chest pain shortness of breath abdominal pain.  Because patient became more confused patient was brought to the ER.  Of note, patient was recently admitted for left shoulder fracture and orthopedics recommended outpatient work-up.  Patient was discharged to skilled nursing facility on May 26, 2019.  In the ER patient was found to be confused with some difficulty speaking and there also was some concern for choking.  Initially patient was agitated required Ativan.  Tele neurology was consulted requested to get work-up for possible TIA and possible seizure however he was leaning towards pain medication as the cause of patient's altered mental status. MRI brain was done which did not show any acute except for nonspecific findings in the cervical spine.  MRA of the head and  neck also was done which was showing nonspecific findings.  Labs showed normal creatinine, ammonia, LFTs platelets blood glucose of 226 anion gap 14 hemoglobin was around and WBC count of 16.3.  Lactic acid was around 1.9.  Chest x-ray UA unremarkable.  EKG was showing sinus tachycardia.  Patient was admitted to hospital service.  EEG was done which was also negative for epileptiform activity.  Patient soon returned to her baseline mental status which was alert and oriented.  She has remained alert and oriented since yesterday.  Now that all the work-up has been completed and is negative, the likely cause of her acute metabolic encephalopathy was opioid pain medications.  She is stable so she is going to be discharged back to her nursing home.  I tried reaching her husband Mr. Kelly Richardson at the phone number document in the chart [phone] but I was unsuccessful reaching out to him and left a voicemail.  Discharge Diagnoses:  Principal Problem:   Acute encephalopathy Active Problems:   HTN (hypertension)   Anemia due to blood loss   Closed lumbar vertebral fracture (HCC)   Uncontrolled type 2 diabetes mellitus with hyperglycemia [facility])    Discharge Instructions  Discharge Instructions    Discharge patient   Complete by: As directed    Discharge disposition: 03-Skilled Nursing Facility   Discharge patient date: 05/31/2019     Allergies as of 05/31/2019      Reactions   Metformin Other (See Comments)   REACTION: faintness, DIZZINESS   Penicillins Anaphylaxis   Seldane [terfenadine] Other (See Comments)   N/V, increased heart rate, seizures, hallinations.   Sulfonamide Derivatives Anaphylaxis   Vicks  Formula 44 Cough Relief [dextromethorphan Hbr] Other (See Comments)   Runs blood pressure up    Xanax [alprazolam] Other (See Comments)   Pt does not want Xanax--history of addiction.   Iohexol Other (See Comments)    Desc: pt states she [place] ct in [place] and immediately experienced  profuse vomiting and sweating.  She said they treated her like she was having a reaction and doesn't remember much else or if meds were given., Onset Date: [id number]   Latex Other (See Comments)   BLISTERING   Other Nausea And Vomiting   "Anything I have ever been given more nausea just increases it."   Percocet [oxycodone-acetaminophen] Other (See Comments)   Hallucinations      Medication List    TAKE these medications   Accu-Chek Aviva Plus test strip Generic drug: glucose blood 1 each by Other route daily.   acetaminophen 325 MG tablet Commonly known as: TYLENOL Take 650 mg by mouth every 6 (six) hours as needed for mild pain.   aspirin 81 MG chewable tablet Chew 81 mg by mouth every evening.   atorvastatin 20 MG tablet Commonly known as: LIPITOR Take 20 mg by mouth every evening.   ferrous sulfate 325 (65 FE) MG tablet Take 1 tablet (325 mg total) by mouth daily with breakfast.   Fifty50 Pen Needles 31G X 8 MM Misc Generic drug: Insulin Pen Needle 1 each by Other route daily.   Insulin Detemir 100 UNIT/ML Pen Commonly known as: Levemir FlexTouch Inject 40 Units into the skin at bedtime.   lidocaine 5 % Commonly known as: Lidoderm Place 1 patch onto the skin daily. Remove & Discard patch within 12 hours or as directed by MD   lisinopril 2.5 MG tablet Commonly known as: ZESTRIL Take 2.5 mg by mouth every evening.   NovoLOG FlexPen 100 UNIT/ML FlexPen Generic drug: insulin aspart Inject 15 Units as directed 3 (three) times daily with meals. Per sliding scale and not to exceed 81 units daily.   ondansetron 4 MG tablet Commonly known as: ZOFRAN Take 4 mg by mouth every 8 (eight) hours as needed for nausea or vomiting.   traMADol 50 MG tablet Commonly known as: Ultram Take 1 tablet (50 mg total) by mouth every 6 (six) hours as needed. What changed: reasons to take this      Follow-up Information    Iona HansenJones, Penny L, NP Follow up in 1 week(s).   Specialty:  Nurse Practitioner Contact information: [address] [place] [address] [phone]          Allergies  Allergen Reactions  . Metformin Other (See Comments)    REACTION: faintness, DIZZINESS  . Penicillins Anaphylaxis  . Seldane [Terfenadine] Other (See Comments)    N/V, increased heart rate, seizures, hallinations.  . Sulfonamide Derivatives Anaphylaxis  . Vicks Formula 44 Cough Relief [Dextromethorphan Hbr] Other (See Comments)    Runs blood pressure up   . Xanax [Alprazolam] Other (See Comments)    Pt does not want Xanax--history of addiction.  . Iohexol Other (See Comments)     Desc: pt states she had a ct in [place] in 2000 and immediately experienced profuse vomiting and sweating.  She said they treated her like she was having a reaction and doesn't remember much else or if meds were given., Onset Date: [id number]   . Latex Other (See Comments)    BLISTERING  . Other Nausea And Vomiting    "Anything I have  ever been given more nausea just increases it."  . Percocet [Oxycodone-Acetaminophen] Other (See Comments)    Hallucinations    Consultations: None   Procedures/Studies: Ct Abdomen Pelvis Wo Contrast  Result Date: 05/18/2019 CLINICAL DATA:  Back pain after fall. EXAM: CT CHEST, ABDOMEN AND PELVIS WITHOUT CONTRAST TECHNIQUE: Multidetector CT imaging of the chest, abdomen and pelvis was performed following the standard protocol without IV contrast. COMPARISON:  CT chest dated Nov 21, 2009. FINDINGS: CT CHEST FINDINGS Cardiovascular: Normal heart size. No pericardial effusion. No thoracic aortic aneurysm. Aortic arch and branch vessel atherosclerotic vascular disease. Mediastinum/Nodes: No enlarged mediastinal, hilar, or axillary lymph nodes. Thyroid gland, trachea, and esophagus demonstrate no significant findings. Lungs/Pleura: No focal consolidation, pleural effusion, or pneumothorax. No suspicious pulmonary nodule. Mild subsegmental atelectasis in both lower  lobes. Musculoskeletal: Acute minimally displaced comminuted fracture of the left humeral head and neck with involvement of the lesser and greater tuberosities. No additional fracture. CT ABDOMEN PELVIS FINDINGS Hepatobiliary: No hepatic injury or perihepatic hematoma. Status post cholecystectomy. No biliary dilatation. Pancreas: Unremarkable. No pancreatic ductal dilatation or surrounding inflammatory changes. Spleen: Lobulated spleen without injury or perisplenic hematoma. Adrenals/Urinary Tract: No adrenal hemorrhage or renal injury identified. Bladder is unremarkable. Stomach/Bowel: Small hiatal hernia. The stomach is otherwise within normal limits. No bowel wall thickening, distention, or surrounding inflammatory changes. Normal appendix. Vascular/Lymphatic: Aortic atherosclerosis. No enlarged abdominal or pelvic lymph nodes. Reproductive: Status post hysterectomy. No adnexal masses. Other: No abdominal wall hernia or abnormality. No abdominopelvic ascites. No pneumoperitoneum. Musculoskeletal: Acute compression fracture of the L2 inferior endplate with minimal height loss centrally. No retropulsion. Prior right total hip arthroplasty. Atrophy of the right iliopsoas and gluteus muscles. Mild superficial fat stranding on both sides of the lower anterior abdominal wall, favored injection related. IMPRESSION: Chest: 1. Acute minimally displaced comminuted fracture of the left humeral head and neck with involvement of the lesser and greater tuberosities. 2.  Aortic atherosclerosis (ICD10-I70.0). Abdomen and pelvis: 1. Acute L2 inferior endplate compression fracture with minimal height loss centrally. No retropulsion. 2. Small hiatal hernia. Electronically Signed   By: Titus Dubin M.D.   On: 05/18/2019 19:35   Dg Thoracic Spine W/swimmers  Result Date: 05/18/2019 CLINICAL DATA:  [place] with back pain. EXAM: THORACIC SPINE - 3 VIEWS COMPARISON:  Chest radiography 05/07/2018 FINDINGS: Minimal curvature convex  to the left. Question minimal compression fractures at superior T12 and possibly inferior T11. This is not definite. No advanced fracture. CT could be performed if desired. IMPRESSION: 1. Question minimal compression fractures at superior T12 and possibly inferior T11. This is not definite. CT could be performed if desired. Electronically Signed   By: Nelson Chimes M.D.   On: 05/18/2019 15:13   Dg Lumbar Spine Complete  Result Date: 05/18/2019 CLINICAL DATA:  [place] with low back pain. EXAM: LUMBAR SPINE - COMPLETE 4+ VIEW COMPARISON:  None. FINDINGS: Mild curvature convex to the right. No antero or retrolisthesis. No evidence of lower thoracic or lumbosacral fracture. No significant degenerative disc disease. Ordinary lower lumbar facet osteoarthritis. IMPRESSION: No acute or traumatic finding. Mild curvature convex to the right. Ordinary lower lumbar facet osteoarthritis. Electronically Signed   By: Nelson Chimes M.D.   On: 05/18/2019 15:10   Ct Head Wo Contrast  Result Date: 05/29/2019 CLINICAL DATA:  Altered level of consciousness. EXAM: CT HEAD WITHOUT CONTRAST TECHNIQUE: Contiguous axial images were obtained from the base of the skull through the vertex without intravenous contrast. COMPARISON:  None. FINDINGS: Brain: No  evidence of acute infarction, hemorrhage, hydrocephalus, extra-axial collection or mass lesion/mass effect. Vascular: No hyperdense vessel or unexpected calcification. Skull: Normal. Negative for fracture or focal lesion. Sinuses/Orbits: No acute finding. Other: None. IMPRESSION: Normal head CT. Electronically Signed   By: Lupita Raider M.D.   On: 05/29/2019 15:15   Ct Chest Wo Contrast  Result Date: 05/18/2019 CLINICAL DATA:  Back pain after fall. EXAM: CT CHEST, ABDOMEN AND PELVIS WITHOUT CONTRAST TECHNIQUE: Multidetector CT imaging of the chest, abdomen and pelvis was performed following the standard protocol without IV contrast. COMPARISON:  CT chest dated Nov 21, 2009.  FINDINGS: CT CHEST FINDINGS Cardiovascular: Normal heart size. No pericardial effusion. No thoracic aortic aneurysm. Aortic arch and branch vessel atherosclerotic vascular disease. Mediastinum/Nodes: No enlarged mediastinal, hilar, or axillary lymph nodes. Thyroid gland, trachea, and esophagus demonstrate no significant findings. Lungs/Pleura: No focal consolidation, pleural effusion, or pneumothorax. No suspicious pulmonary nodule. Mild subsegmental atelectasis in both lower lobes. Musculoskeletal: Acute minimally displaced comminuted fracture of the left humeral head and neck with involvement of the lesser and greater tuberosities. No additional fracture. CT ABDOMEN PELVIS FINDINGS Hepatobiliary: No hepatic injury or perihepatic hematoma. Status post cholecystectomy. No biliary dilatation. Pancreas: Unremarkable. No pancreatic ductal dilatation or surrounding inflammatory changes. Spleen: Lobulated spleen without injury or perisplenic hematoma. Adrenals/Urinary Tract: No adrenal hemorrhage or renal injury identified. Bladder is unremarkable. Stomach/Bowel: Small hiatal hernia. The stomach is otherwise within normal limits. No bowel wall thickening, distention, or surrounding inflammatory changes. Normal appendix. Vascular/Lymphatic: Aortic atherosclerosis. No enlarged abdominal or pelvic lymph nodes. Reproductive: Status post hysterectomy. No adnexal masses. Other: No abdominal wall hernia or abnormality. No abdominopelvic ascites. No pneumoperitoneum. Musculoskeletal: Acute compression fracture of the L2 inferior endplate with minimal height loss centrally. No retropulsion. Prior right total hip arthroplasty. Atrophy of the right iliopsoas and gluteus muscles. Mild superficial fat stranding on both sides of the lower anterior abdominal wall, favored injection related. IMPRESSION: Chest: 1. Acute minimally displaced comminuted fracture of the left humeral head and neck with involvement of the lesser and greater  tuberosities. 2.  Aortic atherosclerosis (ICD10-I70.0). Abdomen and pelvis: 1. Acute L2 inferior endplate compression fracture with minimal height loss centrally. No retropulsion. 2. Small hiatal hernia. Electronically Signed   By: Obie Dredge M.D.   On: 05/18/2019 19:35   Ct Thoracic Spine Wo Contrast  Result Date: 05/18/2019 CLINICAL DATA:  Compression fracture. EXAM: CT THORACIC SPINE WITHOUT CONTRAST TECHNIQUE: Multidetector CT images of the thoracic were obtained using the standard protocol without intravenous contrast. COMPARISON:  X-ray from the same day FINDINGS: Alignment: Normal. Vertebrae: No acute fracture or focal pathologic process. Paraspinal and other soft tissues: There are atherosclerotic changes of the thoracic aorta. There is a small hiatal hernia. The patient is status post prior cholecystectomy. Evaluation of the lung fields is severely limited by respiratory motion artifact. There is no clear acute abnormality. Disc levels: There is mild disc height loss throughout the thoracic spine. There is mild-to-moderate disc height loss involving the lower cervical spine. IMPRESSION: No acute displaced fracture. Electronically Signed   By: Katherine Mantle M.D.   On: 05/18/2019 18:02   Mr Angio Head Wo Contrast  Result Date: 05/29/2019 CLINICAL DATA:  Focal neuro deficit, greater than 6 hours, stroke suspected. Evaluate for CVA. Additional history provided: Altered mental status. Nonverbal. Acute change. EXAM: MRI HEAD WITHOUT CONTRAST MRA HEAD WITHOUT CONTRAST TECHNIQUE: Multiplanar, multiecho pulse sequences of the brain and surrounding structures were obtained without intravenous contrast. Angiographic images  of the head were obtained using MRA technique without contrast. COMPARISON:  Head CT performed earlier the same day 05/29/2019 FINDINGS: MRI HEAD FINDINGS Brain: There is no evidence of acute infarct. No evidence of intracranial mass. No midline shift or extra-axial fluid  collection. No chronic intracranial blood products. Mild scattered T2/FLAIR hyperintensity within the cerebral white matter is nonspecific, but consistent with chronic small vessel ischemic disease. There are incidentally noted developmental venous anomalies within the bilateral cerebellar hemispheres. No other abnormal intracranial enhancement is demonstrated. Vascular: There is precontrast intravascular T1 hyperintensity. Skull and upper cervical spine: No focal marrow lesion. Abnormal T1 hypointensity within the visualized upper cervical spine. Sinuses/Orbits: Bilateral lens replacements. No significant paranasal sinus disease or mastoid effusion. MRA HEAD FINDINGS The intracranial internal carotid arteries are patent without significant stenosis. The bilateral middle and anterior cerebral arteries are patent without significant proximal stenosis. The intracranial vertebral arteries are patent without significant proximal stenosis, as is the basilar artery. The bilateral posterior cerebral arteries are patent without significant proximal stenosis. No intracranial aneurysm is identified. IMPRESSION: MRI brain: 1. No evidence of acute intracranial abnormality, including acute infarct. 2. Mild chronic small vessel ischemic disease. 3. Precontrast intravascular T1 hyperintensity. Correlate for recent iron infusions. 4. Abnormal T1 hypointensity within the visualized upper cervical spine. This finding is nonspecific, but commonly seen in the setting of chronic anemia. MRA head: No intracranial large vessel occlusion or proximal high-grade arterial stenosis. Electronically Signed   By: Jackey Loge DO   On: 05/29/2019 17:27   Mr Angiogram Neck W Or Wo Contrast  Result Date: 05/29/2019 CLINICAL DATA:  Focal neuro deficit, greater than 6 hours, stroke suspected. EXAM: MRA NECK WITHOUT AND WITH CONTRAST TECHNIQUE: Multiplanar and multiecho pulse sequences of the neck were obtained without and with intravenous  contrast. Angiographic images of the neck were obtained using MRA technique without and with intravenous contrast. CONTRAST:  10mL GADAVIST GADOBUTROL 1 MMOL/ML IV SOLN COMPARISON:  No pertinent prior studies available for comparison. FINDINGS: The examination is slightly limited at the level of the lower neck due to cardiac and respiratory motion artifact. Standard aortic branching. There is irregular and diminished enhancement within the proximal to mid right common carotid artery, likely at least partially artifactual. A true stenosis at this site is difficult to exclude. The mid/distal right common carotid artery and cervical right ICA are patent without significant stenosis (50% or greater). The left common and cervical internal carotid arteries are patent without significant stenosis (50% or greater). Codominant vertebral arteries. Narrowing at the origin of the right vertebral artery with suspected at least moderate stenosis at this site. Mild/moderate narrowing at the origin of the left vertebral artery. The vertebral arteries are otherwise patent throughout the neck without significant stenosis (50% or greater). IMPRESSION: Examination somewhat limited at the level of the lower neck due to cardiac and respiratory motion artifact. Irregular and diminished enhancement within the proximal/mid right common carotid artery, which is likely at least partially artifactual. A true stenosis at this site is difficult to exclude. The mid/distal right common carotid artery and cervical ICA are patent without significant stenosis. The left common and cervical internal carotid arteries are patent without significant stenosis. The vertebral arteries are patent bilaterally. Stenoses at the bilateral vertebral artery origins, suspected at least moderate on the right, mild/moderate on the left. Electronically Signed   By: Jackey Loge DO   On: 05/29/2019 17:45   Mr Brain W Wo Contrast  Result Date: 05/29/2019 CLINICAL  DATA:  Focal neuro deficit, greater than 6 hours, stroke suspected. Evaluate for CVA. Additional history provided: Altered mental status. Nonverbal. Acute change. EXAM: MRI HEAD WITHOUT CONTRAST MRA HEAD WITHOUT CONTRAST TECHNIQUE: Multiplanar, multiecho pulse sequences of the brain and surrounding structures were obtained without intravenous contrast. Angiographic images of the head were obtained using MRA technique without contrast. COMPARISON:  Head CT performed earlier the same day 05/29/2019 FINDINGS: MRI HEAD FINDINGS Brain: There is no evidence of acute infarct. No evidence of intracranial mass. No midline shift or extra-axial fluid collection. No chronic intracranial blood products. Mild scattered T2/FLAIR hyperintensity within the cerebral white matter is nonspecific, but consistent with chronic small vessel ischemic disease. There are incidentally noted developmental venous anomalies within the bilateral cerebellar hemispheres. No other abnormal intracranial enhancement is demonstrated. Vascular: There is precontrast intravascular T1 hyperintensity. Skull and upper cervical spine: No focal marrow lesion. Abnormal T1 hypointensity within the visualized upper cervical spine. Sinuses/Orbits: Bilateral lens replacements. No significant paranasal sinus disease or mastoid effusion. MRA HEAD FINDINGS The intracranial internal carotid arteries are patent without significant stenosis. The bilateral middle and anterior cerebral arteries are patent without significant proximal stenosis. The intracranial vertebral arteries are patent without significant proximal stenosis, as is the basilar artery. The bilateral posterior cerebral arteries are patent without significant proximal stenosis. No intracranial aneurysm is identified. IMPRESSION: MRI brain: 1. No evidence of acute intracranial abnormality, including acute infarct. 2. Mild chronic small vessel ischemic disease. 3. Precontrast intravascular T1 hyperintensity.  Correlate for recent iron infusions. 4. Abnormal T1 hypointensity within the visualized upper cervical spine. This finding is nonspecific, but commonly seen in the setting of chronic anemia. MRA head: No intracranial large vessel occlusion or proximal high-grade arterial stenosis. Electronically Signed   By: Jackey Loge DO   On: 05/29/2019 17:27   Ct L-spine No Charge  Result Date: 05/18/2019 CLINICAL DATA:  Lumbar back pain after a fall. EXAM: CT LUMBAR SPINE WITHOUT CONTRAST TECHNIQUE: Multidetector CT imaging of the lumbar spine was performed without intravenous contrast administration. Multiplanar CT image reconstructions were also generated. COMPARISON:  None. FINDINGS: Segmentation: 5 lumbar type vertebral bodies. Alignment: Normal Vertebrae: Possible minimal inferior endplate fracture at L2. I favor that this is an acute injury, but there is some potential that could be a chronic finding. Paraspinal and other soft tissues: Negative Disc levels: No disc abnormality at L3-4 or above. L4-5: Mild bulging of the disc. Mild facet degeneration. No compressive stenosis. L5-S1: Mild bulging of the disc. No disc herniation. Mild facet degeneration. No compressive stenosis. IMPRESSION: Suspicion of a minimal acute inferior endplate fracture at L2. This is not absolutely certain, but I think it is an acute injury. Mild disc bulges and facet degeneration at L4-5 and L5-S1 without compressive stenosis. Electronically Signed   By: Paulina Fusi M.D.   On: 05/18/2019 19:26   Dg Chest Port 1 View  Result Date: 05/29/2019 CLINICAL DATA:  Fever and vomiting. EXAM: PORTABLE CHEST 1 VIEW COMPARISON:  May 23, 2019 FINDINGS: The heart size is mildly enlarged. Again noted is a fracture of the proximal left humerus. There is no pneumothorax. No large pleural effusion. No focal infiltrate. Aortic calcifications are noted. The lung volumes are somewhat low. IMPRESSION: No active disease. Electronically Signed   By:  Katherine Mantle M.D.   On: 05/29/2019 17:29   Dg Chest Port 1 View  Result Date: 05/23/2019 CLINICAL DATA:  Leukocytosis. EXAM: PORTABLE CHEST 1 VIEW COMPARISON:  05/06/2018 FINDINGS: Lungs are clear.  Cardiomediastinal contours are normal. Impacted left humeral neck fracture as before. IMPRESSION: 1. No active cardiopulmonary disease. 2. Left humeral fracture of surgical neck with impaction as before. Electronically Signed   By: Donzetta Kohut M.D.   On: 05/23/2019 09:41   Dg Humerus Left  Result Date: 05/18/2019 CLINICAL DATA:  [place] with pain and deformity. EXAM: LEFT HUMERUS - 2+ VIEW COMPARISON:  None. FINDINGS: Acute fracture at the surgical neck of the humerus. Mild/minimal impaction and angulation. Humeral head is probably properly located. No other regional fracture. IMPRESSION: Acute fracture at the surgical neck of the humerus. Electronically Signed   By: Paulina Fusi M.D.   On: 05/18/2019 15:09      Discharge Exam: Vitals:   05/30/19 2050 05/31/19 0523  BP: 140/63 (!) 102/58  Pulse: 92 81  Resp: 20 20  Temp: 98.2 F (36.8 C) 98.4 F (36.9 C)  SpO2: 98% 95%   Vitals:   05/30/19 1818 05/30/19 1913 05/30/19 2050 05/31/19 0523  BP: 118/65 118/65 140/63 (!) 102/58  Pulse: 85 85 92 81  Resp: Temp: 98.4 F (36.9 C) 98.4 F (36.9 C) 98.2 F (36.8 C) 98.4 F (36.9 C)  TempSrc: Oral Oral Oral Oral  SpO2: 98%  98% 95%  Weight:  98.7 kg  98 kg  Height:  5' 2.01" (1.575 m)      General: Pt is alert, awake, not in acute distress Cardiovascular: RRR, S1/S2 +, no rubs, no gallops Respiratory: CTA bilaterally, no wheezing, no rhonchi Abdominal: Soft, NT, ND, bowel sounds + Extremities: no edema, no cyanosis.  Sling in left upper extremity.    The results of significant diagnostics from this hospitalization (including imaging, microbiology, ancillary and laboratory) are listed below for reference.     Microbiology: Recent Results (from the past 240  hour(s))  SARS CORONAVIRUS 2 (TAT 6-24 HRS) Nasopharyngeal Nasopharyngeal Swab     Status: None   Collection Time: 05/23/19 12:43 AM   Specimen: Nasopharyngeal Swab  Result Value Ref Range Status   SARS Coronavirus 2 NEGATIVE NEGATIVE Final    Comment: (NOTE) SARS-CoV-2 target nucleic acids are NOT DETECTED. The SARS-CoV-2 RNA is generally detectable in upper and lower respiratory specimens during the acute phase of infection. Negative results do not preclude SARS-CoV-2 infection, do not rule out co-infections with other pathogens, and should not be used as the sole basis for treatment or other patient management decisions. Negative results must be combined with clinical observations, patient history, and epidemiological information. The expected result is Negative. Fact Sheet for Patients: HairSlick.no Fact Sheet for Healthcare Providers: quierodirigir.com This test is not yet approved or cleared by the [place] FDA and  has been authorized for detection and/or diagnosis of SARS-CoV-2 by FDA under an Emergency Use Authorization (EUA). This EUA will remain  in effect (meaning this test can be used) for the duration of the COVID-19 declaration under Section 56 4(b)(1) of the Act, 21 U.S.C. section 360bbb-3(b)(1), unless the authorization is terminated or revoked sooner. Performed at [facility], [address] [place], [place] [address]   Blood Culture (routine x 2)     Status: None (Preliminary result)   Collection Time: 05/29/19  2:37 PM   Specimen: BLOOD  Result Value Ref Range Status   Specimen Description   Final    BLOOD BLOOD RIGHT ARM Performed at [facility], [address], [place], [place] [address]    Special Requests   Final  BOTTLES DRAWN AEROBIC ONLY Blood Culture adequate volume Performed at [facility], [address] [place], [place] [address]     Culture   Final    NO GROWTH 1 DAY Performed at [facility], [address] [place], [place] [address]    Report Status PENDING  Incomplete  Urine culture     Status: None   Collection Time: 05/29/19  2:37 PM   Specimen: In/Out Cath Urine  Result Value Ref Range Status   Specimen Description   Final    IN/OUT CATH URINE Performed at [facility], [address], [place], [place] [address]    Special Requests   Final    NONE Performed at [facility], [address], [place], [place] [address]    Culture   Final    NO GROWTH Performed at [facility], [address] [place], [place] [address]    Report Status 05/30/2019 FINAL  Final  Blood Culture (routine x 2)     Status: None (Preliminary result)   Collection Time: 05/29/19  2:42 PM   Specimen: BLOOD RIGHT FOREARM  Result Value Ref Range Status   Specimen Description   Final    BLOOD RIGHT FOREARM Performed at [facility], [address] [place], [place] [address]    Special Requests   Final    BOTTLES DRAWN AEROBIC AND ANAEROBIC Blood Culture adequate volume Performed at [facility], [address], [place], [place] [address]    Culture   Final    NO GROWTH 2 DAYS Performed at [facility], [address], [place], [place] [address]    Report Status PENDING  Incomplete  SARS CORONAVIRUS 2 (TAT 6-24 HRS) Nasopharyngeal Nasopharyngeal Swab     Status: None   Collection Time: 05/29/19 10:50 PM   Specimen: Nasopharyngeal Swab  Result Value Ref Range Status   SARS Coronavirus 2 NEGATIVE NEGATIVE Final    Comment: (NOTE) SARS-CoV-2 target nucleic acids are NOT DETECTED. The SARS-CoV-2 RNA is generally detectable in upper and lower respiratory specimens during the acute phase of infection. Negative results do not preclude SARS-CoV-2 infection, do not rule out co-infections with other pathogens, and should not be used as  the sole basis for treatment or other patient management decisions. Negative results must be combined with clinical observations, patient history, and epidemiological information. The expected result is Negative. Fact Sheet for Patients: HairSlick.no Fact Sheet for Healthcare Providers: quierodirigir.com This test is not yet approved or cleared by the [place] FDA and  has been authorized for detection and/or diagnosis of SARS-CoV-2 by FDA under an Emergency Use Authorization (EUA). This EUA will remain  in effect (meaning this test can be used) for the duration of the COVID-19 declaration under Section 56 4(b)(1) of the Act, 21 U.S.C. section 360bbb-3(b)(1), unless the authorization is terminated or revoked sooner. Performed at [facility], [address], [place], [place] [address]   MRSA PCR Screening     Status: None   Collection Time: 05/30/19 12:56 AM   Specimen: Nasopharyngeal  Result Value Ref Range Status   MRSA by PCR NEGATIVE NEGATIVE Final    Comment:        The GeneXpert MRSA Assay (FDA approved for NASAL specimens only), is one component of a comprehensive MRSA colonization surveillance program. It is not intended to diagnose MRSA infection nor to guide or monitor treatment for MRSA infections.  Performed at [facility], [address] [place], [place] [address]      Labs: BNP (last 3 results) No results for input(s): BNP in the last 8760 hours. Basic Metabolic Panel: Recent Labs  Lab 05/29/19 1437 05/30/19 0015 05/30/19 0131 05/31/19 0558  NA 136  --  138 140  K 4.3  --  4.2 3.2*  CL 103  --  105 111  CO2 19*  --  21* 22  GLUCOSE 226*  --  189* 148*  BUN 12  --  12 14  CREATININE 0.86 0.76 0.74 0.59  CALCIUM 9.2  --  9.0 8.5*   Liver Function Tests: Recent Labs  Lab 05/29/19 1437 05/30/19 0131  AST 23 23  ALT 24 22  ALKPHOS 93 92  BILITOT 1.4* 0.9   PROT 7.3 7.1  ALBUMIN 3.7 3.3*   No results for input(s): LIPASE, AMYLASE in the last 168 hours. Recent Labs  Lab 05/29/19 1439  AMMONIA 11   CBC: Recent Labs  Lab 05/25/19 0532 05/29/19 1437 05/30/19 0131 05/31/19 0558  WBC 11.8* 16.3* 13.2* 10.4  NEUTROABS 7.3 12.2* 9.5* 6.7  HGB 8.9* 10.8* 10.5* 8.8*  HCT 32.2* 37.8 38.7 32.3*  MCV 72.9* 73.4* 76.8* 76.9*  PLT 291 391 294 267   Cardiac Enzymes: No results for input(s): CKTOTAL, CKMB, CKMBINDEX, TROPONINI in the last 168 hours. BNP: Invalid input(s): POCBNP CBG: Recent Labs  Lab 05/30/19 1613 05/30/19 2118 05/30/19 2347 05/31/19 0356 05/31/19 0809  GLUCAP 180* 178* 200* 155* 120*   D-Dimer No results for input(s): DDIMER in the last 72 hours. Hgb A1c Recent Labs    05/29/19 1437  HGBA1C 9.5*   Lipid Profile Recent Labs    05/29/19 1437  CHOL 128  HDL 42  LDLCALC 70  TRIG 79  CHOLHDL 3.0   Thyroid function studies No results for input(s): TSH, T4TOTAL, T3FREE, THYROIDAB in the last 72 hours.  Invalid input(s): FREET3 Anemia work up No results for input(s): VITAMINB12, FOLATE, FERRITIN, TIBC, IRON, RETICCTPCT in the last 72 hours. Urinalysis    Component Value Date/Time   COLORURINE YELLOW 05/29/2019 1437   APPEARANCEUR CLEAR 05/29/2019 1437   LABSPEC 1.015 05/29/2019 1437   PHURINE 5.0 05/29/2019 1437   GLUCOSEU 150 (A) 05/29/2019 1437   GLUCOSEU NEGATIVE 07/26/2006 1044   HGBUR NEGATIVE 05/29/2019 1437   BILIRUBINUR NEGATIVE 05/29/2019 1437   KETONESUR 20 (A) 05/29/2019 1437   PROTEINUR NEGATIVE 05/29/2019 1437   UROBILINOGEN 0.2 09/08/2012 1444   NITRITE NEGATIVE 05/29/2019 1437   LEUKOCYTESUR NEGATIVE 05/29/2019 1437   Sepsis Labs Invalid input(s): PROCALCITONIN,  WBC,  LACTICIDVEN Microbiology Recent Results (from the past 240 hour(s))  SARS CORONAVIRUS 2 (TAT 6-24 HRS) Nasopharyngeal Nasopharyngeal Swab     Status: None   Collection Time: 05/23/19 12:43 AM   Specimen:  Nasopharyngeal Swab  Result Value Ref Range Status   SARS Coronavirus 2 NEGATIVE NEGATIVE Final    Comment: (NOTE) SARS-CoV-2 target nucleic acids are NOT DETECTED. The SARS-CoV-2 RNA is generally detectable in upper and lower respiratory specimens during the acute phase of infection. Negative results do not preclude SARS-CoV-2 infection, do not rule out co-infections with other pathogens, and should not be used as the sole basis for treatment or other patient management decisions. Negative results must be combined with clinical observations, patient history, and epidemiological information. The expected result is Negative. Fact Sheet for Patients: HairSlick.no Fact Sheet for Healthcare Providers: quierodirigir.com This test is not yet approved  or cleared by the [place] and  has been authorized for detection and/or diagnosis of SARS-CoV-2 by FDA under an Emergency Use Authorization (EUA). This EUA will remain  in effect (meaning this test can be used) for the duration of the COVID-19 declaration under Section 56 4(b)(1) of the Act, 21 U.S.C. section 360bbb-3(b)(1), unless the authorization is terminated or revoked sooner. Performed at [facility], [address] [place], [place] [address]   Blood Culture (routine x 2)     Status: None (Preliminary result)   Collection Time: 05/29/19  2:37 PM   Specimen: BLOOD  Result Value Ref Range Status   Specimen Description   Final    BLOOD BLOOD RIGHT ARM Performed at [facility], [address], [place], [place] [address]    Special Requests   Final    BOTTLES DRAWN AEROBIC ONLY Blood Culture adequate volume Performed at [facility], [address] [place], [place] [address]    Culture   Final    NO GROWTH 1 DAY Performed at [facility], [address] [place], [place] [address]    Report Status PENDING  Incomplete   Urine culture     Status: None   Collection Time: 05/29/19  2:37 PM   Specimen: In/Out Cath Urine  Result Value Ref Range Status   Specimen Description   Final    IN/OUT CATH URINE Performed at [facility], [address], [place], [place] [address]    Special Requests   Final    NONE Performed at [facility], [address], [place], [place] [address]    Culture   Final    NO GROWTH Performed at [facility], [address], [place], [place] [address]    Report Status 05/30/2019 FINAL  Final  Blood Culture (routine x 2)     Status: None (Preliminary result)   Collection Time: 05/29/19  2:42 PM   Specimen: BLOOD RIGHT FOREARM  Result Value Ref Range Status   Specimen Description   Final    BLOOD RIGHT FOREARM Performed at [facility], [address], [place], [place] [address]    Special Requests   Final    BOTTLES DRAWN AEROBIC AND ANAEROBIC Blood Culture adequate volume Performed at [facility], [address] [place], [place] [address]    Culture   Final    NO GROWTH 2 DAYS Performed at [facility], [address], [place], [place] [address]    Report Status PENDING  Incomplete  SARS CORONAVIRUS 2 (TAT 6-24 HRS) Nasopharyngeal Nasopharyngeal Swab     Status: None   Collection Time: 05/29/19 10:50 PM   Specimen: Nasopharyngeal Swab  Result Value Ref Range Status   SARS Coronavirus 2 NEGATIVE NEGATIVE Final    Comment: (NOTE) SARS-CoV-2 target nucleic acids are NOT DETECTED. The SARS-CoV-2 RNA is generally detectable in upper and lower respiratory specimens during the acute phase of infection. Negative results do not preclude SARS-CoV-2 infection, do not rule out co-infections with other pathogens, and should not be used as the sole basis for treatment or other patient management decisions. Negative results must be combined with clinical observations, patient history, and  epidemiological information. The expected result is Negative. Fact Sheet for Patients: HairSlick.no Fact Sheet for Healthcare Providers: quierodirigir.com This test is not yet approved or cleared by the [place] FDA and  has been authorized for  detection and/or diagnosis of SARS-CoV-2 by FDA under an Emergency Use Authorization (EUA). This EUA will remain  in effect (meaning this test can be used) for the duration of the COVID-19 declaration under Section 56 4(b)(1) of the Act, 21 U.S.C. section 360bbb-3(b)(1), unless the authorization is terminated or revoked sooner. Performed at [facility], [address], [place], [place] [address]   MRSA PCR Screening     Status: None   Collection Time: 05/30/19 12:56 AM   Specimen: Nasopharyngeal  Result Value Ref Range Status   MRSA by PCR NEGATIVE NEGATIVE Final    Comment:        The GeneXpert MRSA Assay (FDA approved for NASAL specimens only), is one component of a comprehensive MRSA colonization surveillance program. It is not intended to diagnose MRSA infection nor to guide or monitor treatment for MRSA infections. Performed at [facility], [address], [place], [place] [address]      Time coordinating discharge: Over 30 minutes  SIGNED:   Hughie Closs, MD  [facility] 05/31/2019, 11:08 AM  If 7PM-7AM, please contact night-coverage [email or web address] Password [email or web address]

## 2019-05-31 NOTE — Discharge Instructions (Signed)
Opioid Pain Medicine Management Opioid pain medicines are strong medicines that are used to treat bad or very bad pain. When you take them for a short time, they can help you:  Sleep better.  Do better in physical therapy.  Feel better during the first few days after you get hurt.  Recover from surgery. Only take these medicines if a doctor says that you can. You should only take them for a short time. This is because opioids can be hard to stop taking (they are addictive). The longer you take opioids, the harder it may be to stop taking them (opioid use disorder). What are the risks? Opioids can cause problems (side effects). Taking them for more than 3 days raises your chance of problems, such as:  Trouble pooping (constipation).  Feeling sick to your stomach (nausea).  Vomiting.  Feeling very sleepy.  Confusion.  Not being able to stop taking the medicine.  Breathing problems. Taking opioids for a long time can make it hard for you to do daily tasks. It can also put you at risk for:  Car accidents.  Depression.  Suicide.  Heart attack.  Taking too much of the medicine (overdose), which can sometimes lead to death. What is a pain treatment plan? A pain treatment plan is a plan made by you and your doctor. Work with your doctor to make a plan for treating your pain. To help you do this:  Talk about the goals of your treatment, including: ? How much pain you might expect to have. ? How you will manage the pain.  Talk about the risks and benefits of taking these medicines for your condition.  Remember that a good treatment plan uses more than one approach and lowers the risks of side effects.  Tell your doctor about the amount of medicines you take and about any drug or alcohol use.  Get your pain medicine prescriptions from only one doctor. Pain can be managed with other treatments. Work with your doctor to find other ways to help your pain, such as:  Physical  therapy.  Counseling.  Eating healthy foods.  Brain exercises.  Massage.  Meditation.  Other pain medicines.  Doing gentle exercises. Tapering your use of opioids If you have been taking opioids for more than a few weeks, you may need to slowly decrease (taper) how much you take until you stop taking them. Doing this can lower your chance of having symptoms, such as:  Pain and cramping in your belly (abdomen).  Feeling sick to your stomach.  Sweating.  Feeling very sleepy.  Feeling restless.  Shaking you cannot control (tremors).  Cravings for the medicine. Do not try to stop taking them by yourself. Work with your doctor to stop. Your doctor will help you take less until you are not taking the medicine at all. Follow these instructions at home: Safety and storage   While you are taking opioids: ? Do not drive. ? Do not use machines or power tools. ? Do not sign important papers (legal documents). ? Do not drink alcohol. ? Do not take sleeping pills. ? Do not take care of children by yourself. ? Do not do activities where you need to climb or be in high places, like working on a ladder. ? Do not go into any water, such as a lake, river, ocean, swimming pool, or hot tub.  Keep your opioids locked up or in a place where children cannot reach them.  Do not share your   pain medicine with anyone. Getting rid of leftover pills Do not save any leftover pills. Get rid of leftover pills safely by:  Taking them to a take-back program in your area.  Bringing them to a pharmacy that has a container for throwing away pills (pill disposal).  Throwing them in the trash. Check the label or package insert of your medicine to see whether this is safe to do. If it is safe to throw them out: 1. Take the pills out of their container. 2. Mix the pills with pet poop or food scraps. 3. Put this in the trash. Activity  Return to your normal activities as told by your doctor. Ask  your doctor what activities are safe for you.  Avoid doing things that make your pain worse.  Do exercises as told by your doctor. General instructions  You may need to take these actions to prevent or treat trouble pooping: ? Drink enough fluid to keep your pee (urine) pale yellow. ? Take over-the-counter or prescription medicines. ? Eat foods that are high in fiber. These include beans, whole grains, and fresh fruits and vegetables. ? Limit foods that are high in fat and sugar. These include fried or sweet foods.  Keep all follow-up visits as told by your doctor. This is important. Where to find support If you have been taking opioids for a long time, think about getting help quitting from a local support group or counselor. Ask your doctor about this. Where to find more information Centers for Disease Control and Prevention (CDC): www.cdc.gov Get help right away if: Seek medical care right away if you are taking opioids and you, or people close to you, notice any of the following:  You have trouble breathing.  Your breathing is slower or more shallow than normal.  You have a very slow heartbeat.  You feel very confused.  You pass out (faint).  You are very sleepy.  Your speech is not normal.  You feel sick to your stomach and vomit.  You have cold skin.  You have blue lips or fingernails.  Your muscles are weak (limp) and your body seems floppy.  The black centers of your eyes (pupils) are smaller than normal. If you think that you or someone else may have taken too much of an opioid medicine, get medical help right away. Call your local emergency services (911 in the [place] Do not drive yourself to the hospital. If you ever feel like you may hurt yourself or others, or have thoughts about taking your own life, get help right away. You can go to your nearest emergency department or call:  Your local emergency services (911 in the [place]  The hotline of the National  Poison Control Center (1-800-222-1222 in the [place]  A suicide crisis helpline, such as the National Suicide Prevention Lifeline at 1-800-273-8255. This is open 24 hours a day. Summary  Opioid are strong medicines that are used to treat bad or very bad pain.  A pain treatment plan is a plan made by you and your doctor. Work with your doctor to make a plan for treating your pain.  Work with your doctor to find other ways to help your pain.  If you think that you or someone else may have taken too much of an opioid, get help right away. This information is not intended to replace advice given to you by your health care provider. Make sure you discuss any questions you have with your health care provider.   Document Released: 04/13/2017 Document Revised: 07/22/2018 Document Reviewed: 07/22/2018 Elsevier Patient Education  2020 Elsevier Inc.  

## 2019-05-31 NOTE — TOC Transition Note (Signed)
Transition of Care [facility]) - CM/SW Discharge Note   Patient Details  Name: Kelly Richardson MRN: [id number] Date of Birth: 1951/06/23  [facility] CM/SW Contact:  Trish Mage, LCSW Phone Number: 05/31/2019, 11:58 AM   Clinical Narrative:   Patient to return to [facility] today.  Nursing please call report to [phone], room 101P.  Transportation set up through [employer].  TOC sign off.    Final next level of care: Skilled Nursing Facility Barriers to Discharge: No Barriers Identified   Patient Goals and CMS Choice        Discharge Placement                       Discharge Plan and Services                                     Social Determinants of Health (SDOH) Interventions     Readmission Risk Interventions No flowsheet data found.

## 2019-05-31 NOTE — Progress Notes (Signed)
Dr. Doristine Bosworth aware bladder scanner showed >273. Order received to in and out cath. 400 ml clear, medium urine resulted. Pt expressed relief.

## 2019-05-31 NOTE — Plan of Care (Signed)
Discharged via stretcher with [facility] to be transported back to [facility]. Packet provided for EMS to deliver upon arrival. Assessment unchanged. Report called and given to [facility], nurse.

## 2019-06-03 LAB — CULTURE, BLOOD (ROUTINE X 2)
Culture: NO GROWTH
Special Requests: ADEQUATE

## 2019-06-04 LAB — CULTURE, BLOOD (ROUTINE X 2)
Culture: NO GROWTH
Special Requests: ADEQUATE

## 2019-06-20 ENCOUNTER — Ambulatory Visit (INDEPENDENT_AMBULATORY_CARE_PROVIDER_SITE_OTHER): Payer: Medicare Other | Admitting: Orthopedic Surgery

## 2019-06-20 ENCOUNTER — Ambulatory Visit: Payer: Medicare Other | Admitting: Orthopedic Surgery

## 2019-06-20 ENCOUNTER — Other Ambulatory Visit: Payer: Self-pay

## 2019-06-20 ENCOUNTER — Ambulatory Visit (INDEPENDENT_AMBULATORY_CARE_PROVIDER_SITE_OTHER): Payer: Medicare Other

## 2019-06-20 DIAGNOSIS — M25512 Pain in left shoulder: Secondary | ICD-10-CM

## 2019-06-21 ENCOUNTER — Encounter: Payer: Self-pay | Admitting: Orthopedic Surgery

## 2019-06-21 NOTE — Progress Notes (Signed)
Office Visit Note   Patient: Kelly Richardson           Date of Birth: 1951/06/15           MRN: [id number] Visit Date: 06/20/2019              Requested by: Berkley Harvey, NP [place],  [place] [address] PCP: Berkley Harvey, NP  Chief Complaint  Patient presents with  . Left Shoulder - Pain      HPI: Patient is a 68 year old woman who is seen in follow-up for proximal left humerus fracture.  Patient is currently at rehab at [facility]  She is in a sling she states her pain is well 0 out of 10.   Assessment & Plan: Visit Diagnoses:  1. Acute pain of left shoulder     Plan: Patient will discontinue the sling work on range of motion and strengthening.  2 view radiographs of the left shoulder at follow-up.  Follow-Up Instructions: Return in about 4 weeks (around 07/18/2019).   Ortho Exam  Patient is alert, oriented, no adenopathy, well-dressed, normal affect, normal respiratory effort. Examination patient has no redness or cellulitis around the left shoulder.  Her hand is neurovascular intact.  She is currently asymptomatic at rest.  Imaging: XR Shoulder Left  Result Date: 06/21/2019 2 view radiographs of the left shoulder shows healing proximal humerus fracture.   No images are attached to the encounter.  Labs: Lab Results  Component Value Date   HGBA1C 9.5 (H) 05/29/2019   HGBA1C 10.6 (H) 05/23/2019   HGBA1C 7.2 (H) 06/08/2014   ESRSEDRATE 26 04/09/2015   ESRSEDRATE 73 (H) 07/12/2014   ESRSEDRATE 105 (H) 06/11/2014   CRP 0.5 04/09/2015   CRP 1.0 (H) 07/12/2014   CRP 9.3 (H) 06/11/2014   REPTSTATUS 06/03/2019 FINAL 05/29/2019   GRAMSTAIN  11/09/2014    RARE WBC PRESENT,BOTH PMN AND MONONUCLEAR NO ORGANISMS SEEN Performed at [place]  11/09/2014    RARE WBC PRESENT,BOTH PMN AND MONONUCLEAR NO ORGANISMS SEEN Gram Stain Report Called to,Read Back By and Verified With: D REESE,RN 05.06.16 1846 M  SHIPMAN    GRAMSTAIN  11/09/2014    RARE WBC PRESENT,BOTH PMN AND MONONUCLEAR NO ORGANISMS SEEN Performed at [facility] Gram Stain Report Called to,Read Back By and Verified With: Gram Stain Report Called to,Read Back By and Verified With: D.REESE  RN ON [id number] AT 1846 BY M.SHIPMAN Performed at [place]  05/29/2019    NO GROWTH 5 DAYS Performed at [facility], [place] [address], [place], [place] [address]      Lab Results  Component Value Date   ALBUMIN 3.3 (L) 05/30/2019   ALBUMIN 3.7 05/29/2019   ALBUMIN 3.8 05/22/2019    No results found for: MG No results found for: VD25OH  No results found for: PREALBUMIN CBC EXTENDED Latest Ref Rng & Units 05/31/2019 05/30/2019 05/29/2019  WBC 4.0 - 10.5 K/uL 10.4 13.2(H) 16.3(H)  RBC 3.87 - 5.11 MIL/uL 4.20 5.04 5.15(H)  HGB 12.0 - 15.0 g/dL 8.8(L) 10.5(L) 10.8(L)  HCT 36.0 - 46.0 % 32.3(L) 38.7 37.8  PLT 150 - 400 K/uL 267 294 391  NEUTROABS 1.7 - 7.7 K/uL 6.7 9.5(H) 12.2(H)  LYMPHSABS 0.7 - 4.0 K/uL 2.0 1.8 1.9     There is no height or weight on file to calculate BMI.  Orders:  Orders Placed  This Encounter  Procedures  . XR Shoulder Left   No orders of the defined types were placed in this encounter.    Procedures: No procedures performed  Clinical Data: No additional findings.  ROS:  All other systems negative, except as noted in the HPI. Review of Systems  Objective: Vital Signs: There were no vitals taken for this visit.  Specialty Comments:  No specialty comments available.  PMFS History: Patient Active Problem List   Diagnosis Date Noted  . Uncontrolled type 2 diabetes mellitus with hyperglycemia (HCC) 05/30/2019  . Acute encephalopathy 05/29/2019  . Humerus fracture 05/23/2019  . Closed lumbar vertebral fracture (HCC) 05/23/2019  . Leukocytosis 05/23/2019  . Hyperglycemia 05/23/2019  . Fall 05/23/2019  . Health care maintenance 04/09/2015  . H/O total hip  arthroplasty 11/09/2014  . Osteomyelitis of right hip (HCC) 07/12/2014  . Chest pain 06/22/2014  . Anemia due to blood loss 06/20/2014  . Status post revision of total hip replacement 06/08/2014  . Anxiety state 03/05/2008  . FATIGUE 03/05/2008  . Uncontrolled insulin dependent diabetes mellitus 01/02/2008  . Hyperlipidemia 01/02/2008  . HTN (hypertension) 01/02/2008   Past Medical History:  Diagnosis Date  . Arthritis   . Complication of anesthesia   . Diabetes mellitus without complication (HCC)   . Heart murmur    as a child  . Hypertension   . PONV (postoperative nausea and vomiting)   . Seizures (HCC)   . Shortness of breath dyspnea    sometimes  at night    History reviewed. No pertinent family history.  Past Surgical History:  Procedure Laterality Date  . ABDOMINAL HYSTERECTOMY    . CHOLECYSTECTOMY  1995  . COLONOSCOPY    . EXCISIONAL TOTAL HIP ARTHROPLASTY WITH ANTIBIOTIC SPACERS Right 09/07/2014   Procedure: Excision Total Hip Arthroplasty, Place Antibiotic Spacer;  Surgeon: Nadara Mustard, MD;  [facility];  Service: Orthopedics;  Laterality: Right;  . INCISION AND DRAINAGE HIP Right 06/08/2014   Procedure: Irrigation and Debridement Right Hip, Revision of Cup and Head of Total Hip with  Placement  of Antibiotic Spacer;  Surgeon: Nadara Mustard, MD;  Location[facility];  Service: Orthopedics;  Laterality: Right;  . TONSILLECTOMY    . TOTAL HIP ARTHROPLASTY Right 2011  . TOTAL HIP REVISION Right 11/09/2014   Procedure: TOTAL HIP REVISION;  Surgeon: Nadara Mustard, MD;  [facility];  Service: Orthopedics;  Laterality: Right;   Social History   Occupational History  . Not on file  Tobacco Use  . Smoking status: Never Smoker  . Smokeless tobacco: Never Used  Substance and Sexual Activity  . Alcohol use: No    Alcohol/week: 0.0 standard drinks  . Drug use: No  . Sexual activity: Not on file

## 2019-07-14 DIAGNOSIS — I700 Atherosclerosis of aorta: Secondary | ICD-10-CM | POA: Insufficient documentation

## 2019-07-18 ENCOUNTER — Ambulatory Visit: Payer: Medicare Other | Admitting: Orthopedic Surgery

## 2019-07-27 ENCOUNTER — Ambulatory Visit (INDEPENDENT_AMBULATORY_CARE_PROVIDER_SITE_OTHER): Payer: Medicare Other | Admitting: Orthopedic Surgery

## 2019-07-27 ENCOUNTER — Encounter: Payer: Self-pay | Admitting: Orthopedic Surgery

## 2019-07-27 ENCOUNTER — Other Ambulatory Visit: Payer: Self-pay

## 2019-07-27 ENCOUNTER — Ambulatory Visit: Payer: Self-pay

## 2019-07-27 VITALS — Ht 62.01 in | Wt 216.1 lb

## 2019-07-27 DIAGNOSIS — M25512 Pain in left shoulder: Secondary | ICD-10-CM

## 2019-07-27 NOTE — Progress Notes (Signed)
Office Visit Note   Patient: Kelly Richardson           Date of Birth: [birth year]           MRN: [id number] Visit Date: 07/27/2019              Requested by: Iona Hansen, NP [address] [place],  [place] [address] PCP: Iona Hansen, NP  Chief Complaint  Patient presents with  . Left Shoulder - Follow-up      HPI:  The patient is 2 months s/p Non operative humerus fracture. She is now at home and states she hashad and evaluation by home health and would like to start PT at home.   Assessment & Plan: Visit Diagnoses:  1. Acute pain of left shoulder     Plan: She will work with home OT /PT and I have given her an rx to provide to them. She will follow up in 1 month  Follow-Up Instructions: No follow-ups on file.   Ortho Exam  Patient is alert, oriented, no adenopathy, well-dressed, normal affect, normal respiratory effort. Left shoulder. Distal cms is intact. Shoulder range of motion is quite stiff and moderately  painful  Imaging: No results found. No images are attached to the encounter.  Labs: Lab Results  Component Value Date   HGBA1C 9.5 (H) 05/29/2019   HGBA1C 10.6 (H) 05/23/2019   HGBA1C 7.2 (H) 06/08/2014   ESRSEDRATE 26 04/09/2015   ESRSEDRATE 73 (H) 07/12/2014   ESRSEDRATE 105 (H) 06/11/2014   CRP 0.5 04/09/2015   CRP 1.0 (H) 07/12/2014   CRP 9.3 (H) 06/11/2014   REPTSTATUS 06/03/2019 FINAL 05/29/2019   GRAMSTAIN  11/09/2014    RARE WBC PRESENT,BOTH PMN AND MONONUCLEAR NO ORGANISMS SEEN Performed at [facility]    GRAMSTAIN  11/09/2014    RARE WBC PRESENT,BOTH PMN AND MONONUCLEAR NO ORGANISMS SEEN Gram Stain Report Called to,Read Back By and Verified With: D REESE,RN 05.06.16 1846 M SHIPMAN    GRAMSTAIN  11/09/2014    RARE WBC PRESENT,BOTH PMN AND MONONUCLEAR NO ORGANISMS SEEN Performed at [facility] Gram Stain Report Called to,Read Back By and Verified With: Gram Stain Report Called to,Read Back By and  Verified With: D.REESE  RN ON [id number] AT 1846 BY M.SHIPMAN Performed at [facility]    CULT  05/29/2019    NO GROWTH 5 DAYS Performed at [facility], [address] [place], [place] [address]      Lab Results  Component Value Date   ALBUMIN 3.3 (L) 05/30/2019   ALBUMIN 3.7 05/29/2019   ALBUMIN 3.8 05/22/2019    No results found for: MG No results found for: VD25OH  No results found for: PREALBUMIN CBC EXTENDED Latest Ref Rng & Units 05/31/2019 05/30/2019 05/29/2019  WBC 4.0 - 10.5 K/uL 10.4 13.2(H) 16.3(H)  RBC 3.87 - 5.11 MIL/uL 4.20 5.04 5.15(H)  HGB 12.0 - 15.0 g/dL 7.8(M) 10.5(L) 10.8(L)  HCT 36.0 - 46.0 % 32.3(L) 38.7 37.8  PLT 150 - 400 K/uL 267 294 391  NEUTROABS 1.7 - 7.7 K/uL 6.7 9.5(H) 12.2(H)  LYMPHSABS 0.7 - 4.0 K/uL 2.0 1.8 1.9     Body mass index is 39.5 kg/m.  Orders:  Orders Placed This Encounter  Procedures  . XR Shoulder Left   No orders of the defined types were placed in this encounter.    Procedures: No procedures performed  Clinical Data: No additional findings.  ROS:  All other systems negative, except as noted in the HPI. Review of Systems  Objective: Vital Signs: Ht 5' 2.01" (1.575 m)   Wt 216 lb 0.8 oz (98 kg)   BMI 39.50 kg/m   Specialty Comments:  No specialty comments available.  PMFS History: Patient Active Problem List   Diagnosis Date Noted  . Uncontrolled type 2 diabetes mellitus with hyperglycemia ([place]) 05/30/2019  . Acute encephalopathy 05/29/2019  . Humerus fracture 05/23/2019  . Closed lumbar vertebral fracture ([place]) 05/23/2019  . Leukocytosis 05/23/2019  . Hyperglycemia 05/23/2019  . Fall 05/23/2019  . Health care maintenance 04/09/2015  . H/O total hip arthroplasty 11/09/2014  . Osteomyelitis of right hip ([employer]) 07/12/2014  . Chest pain 06/22/2014  . Anemia due to blood loss 06/20/2014  . Status post revision of total hip replacement 06/08/2014  . Anxiety state 03/05/2008  . FATIGUE  03/05/2008  . Uncontrolled insulin dependent diabetes mellitus 01/02/2008  . Hyperlipidemia 01/02/2008  . HTN (hypertension) 01/02/2008   Past Medical History:  Diagnosis Date  . Arthritis   . Complication of anesthesia   . Diabetes mellitus without complication ([place])   . Heart murmur    as a child  . Hypertension   . PONV (postoperative nausea and vomiting)   . Seizures ([place])   . Shortness of breath dyspnea    sometimes  at night    No family history on file.  Past Surgical History:  Procedure Laterality Date  . ABDOMINAL HYSTERECTOMY    . CHOLECYSTECTOMY  1995  . COLONOSCOPY    . EXCISIONAL TOTAL HIP ARTHROPLASTY WITH ANTIBIOTIC SPACERS Right 09/07/2014   Procedure: Excision Total Hip Arthroplasty, Place Antibiotic Spacer;  Surgeon: Newt Minion, MD;  Location: [facility];  Service: Orthopedics;  Laterality: Right;  . INCISION AND DRAINAGE HIP Right 06/08/2014   Procedure: Irrigation and Debridement Right Hip, Revision of Cup and Head of Total Hip with  Placement  of Antibiotic Spacer;  Surgeon: Newt Minion, MD;  Location: [facility];  Service: Orthopedics;  Laterality: Right;  . TONSILLECTOMY    . TOTAL HIP ARTHROPLASTY Right 2011  . TOTAL HIP REVISION Right 11/09/2014   Procedure: TOTAL HIP REVISION;  Surgeon: Newt Minion, MD;  Location: [facility];  Service: Orthopedics;  Laterality: Right;   Social History   Occupational History  . Not on file  Tobacco Use  . Smoking status: Never Smoker  . Smokeless tobacco: Never Used  Substance and Sexual Activity  . Alcohol use: No    Alcohol/week: 0.0 standard drinks  . Drug use: No  . Sexual activity: Not on file

## 2019-08-01 ENCOUNTER — Telehealth: Payer: Self-pay

## 2019-08-01 NOTE — Telephone Encounter (Signed)
Kelly Richardson with [name] called and would like clarifications instructions for left arm OT.  Cb# [phone].  Please advise.  Thank you.

## 2019-08-03 ENCOUNTER — Telehealth: Payer: Self-pay

## 2019-08-03 NOTE — Telephone Encounter (Signed)
No restrictions with therapy

## 2019-08-03 NOTE — Telephone Encounter (Signed)
Kelly Richardson was called and informed patient has no restrictions per Dr Audrie Lia orders.

## 2019-08-03 NOTE — Telephone Encounter (Signed)
Marcelino Duster with [name] called again and would like clarifications/instructions for left arm OT.  Cb# [phone].  Please advise.  Thank you.

## 2019-08-03 NOTE — Telephone Encounter (Signed)
Please advise, thank you. Do you have any restrictions for this patient and will ROM and strengthening be okay for her.

## 2019-08-24 ENCOUNTER — Ambulatory Visit: Payer: Medicare Other | Admitting: Physician Assistant

## 2019-08-25 ENCOUNTER — Ambulatory Visit (INDEPENDENT_AMBULATORY_CARE_PROVIDER_SITE_OTHER): Payer: Medicare Other | Admitting: Physician Assistant

## 2019-08-25 ENCOUNTER — Encounter: Payer: Self-pay | Admitting: Physician Assistant

## 2019-08-25 ENCOUNTER — Other Ambulatory Visit: Payer: Self-pay

## 2019-08-25 VITALS — Ht 62.0 in | Wt 216.0 lb

## 2019-08-25 DIAGNOSIS — S42202A Unspecified fracture of upper end of left humerus, initial encounter for closed fracture: Secondary | ICD-10-CM

## 2019-08-25 NOTE — Progress Notes (Signed)
Office Visit Note   Patient: Kelly Richardson           Date of Birth: 02/21/1951           MRN: [id number] Visit Date: 08/25/2019              Requested by: Berkley Harvey, NP [place],  [place] [address] PCP: Berkley Harvey, NP  Chief Complaint  Patient presents with  . Left Shoulder - Follow-up    3 months s/p non operative humeral fx       HPI: This is a pleasant 69 year old woman who is here for her final visit regarding her left shoulder. She is status post nonoperative treatment of a humerus fracture proximally. She has completed OT and PT. She denies any pain and is gotten back to activities. She did request to be brought in by wheelchair today as she said she injured her right foot. Both our medical assistant and myself asked her if she wanted [place] to evaluate this and get x-rays and she declined stating she just hit it on a gait  Assessment & Plan: Visit Diagnoses: No diagnosis found.  Plan: She may follow-up as needed. I did tell her if her foot did not improve she could come in at any time and we would evaluate it. She continued to insist that it was not a problem  Follow-Up Instructions: No follow-ups on file.   Ortho Exam  Patient is alert, oriented, no adenopathy, well-dressed, normal affect, normal respiratory effort. Left shoulder. She has full range of motion and good strength. Distal CMS is intact nontender over the fracture site  Imaging: No results found. No images are attached to the encounter.  Labs: Lab Results  Component Value Date   HGBA1C 9.5 (H) 05/29/2019   HGBA1C 10.6 (H) 05/23/2019   HGBA1C 7.2 (H) 06/08/2014   ESRSEDRATE 26 04/09/2015   ESRSEDRATE 73 (H) 07/12/2014   ESRSEDRATE 105 (H) 06/11/2014   CRP 0.5 04/09/2015   CRP 1.0 (H) 07/12/2014   CRP 9.3 (H) 06/11/2014   REPTSTATUS 06/03/2019 FINAL 05/29/2019   GRAMSTAIN  11/09/2014    RARE WBC PRESENT,BOTH PMN AND MONONUCLEAR NO ORGANISMS SEEN Performed at  [place]  11/09/2014    RARE WBC PRESENT,BOTH PMN AND MONONUCLEAR NO ORGANISMS SEEN Gram Stain Report Called to,Read Back By and Verified With: D REESE,RN 05.06.16 1846 M SHIPMAN    GRAMSTAIN  11/09/2014    RARE WBC PRESENT,BOTH PMN AND MONONUCLEAR NO ORGANISMS SEEN Performed at [facility] Gram Stain Report Called to,Read Back By and Verified With: Gram Stain Report Called to,Read Back By and Verified With: D.REESE  RN ON [id number] AT 1846 BY M.SHIPMAN Performed at [place]  05/29/2019    NO GROWTH 5 DAYS Performed at [facility], [place] [address], [place], [place] [address]      Lab Results  Component Value Date   ALBUMIN 3.3 (L) 05/30/2019   ALBUMIN 3.7 05/29/2019   ALBUMIN 3.8 05/22/2019    No results found for: MG No results found for: VD25OH  No results found for: PREALBUMIN CBC EXTENDED Latest Ref Rng & Units 05/31/2019 05/30/2019 05/29/2019  WBC 4.0 - 10.5 K/uL 10.4 13.2(H) 16.3(H)  RBC 3.87 - 5.11 MIL/uL 4.20 5.04 5.15(H)  HGB 12.0 - 15.0 g/dL 8.8(L) 10.5(L) 10.8(L)  HCT 36.0 - 46.0 % 32.3(L) 38.7 37.8  PLT 150 -  400 K/uL 267 294 391  NEUTROABS 1.7 - 7.7 K/uL 6.7 9.5(H) 12.2(H)  LYMPHSABS 0.7 - 4.0 K/uL 2.0 1.8 1.9     Body mass index is 39.51 kg/m.  Orders:  No orders of the defined types were placed in this encounter.  No orders of the defined types were placed in this encounter.    Procedures: No procedures performed  Clinical Data: No additional findings.  ROS:  All other systems negative, except as noted in the HPI. Review of Systems  Objective: Vital Signs: Ht 5\' 2"  (1.575 m)   Wt 216 lb (98 kg)   BMI 39.51 kg/m   Specialty Comments:  No specialty comments available.  PMFS History: Patient Active Problem List   Diagnosis Date Noted  . Uncontrolled type 2 diabetes mellitus with hyperglycemia (HCC) 05/30/2019  . Acute encephalopathy 05/29/2019  . Humerus fracture  05/23/2019  . Closed lumbar vertebral fracture (HCC) 05/23/2019  . Leukocytosis 05/23/2019  . Hyperglycemia 05/23/2019  . Fall 05/23/2019  . Health care maintenance 04/09/2015  . H/O total hip arthroplasty 11/09/2014  . Osteomyelitis of right hip (HCC) 07/12/2014  . Chest pain 06/22/2014  . Anemia due to blood loss 06/20/2014  . Status post revision of total hip replacement 06/08/2014  . Anxiety state 03/05/2008  . FATIGUE 03/05/2008  . Uncontrolled insulin dependent diabetes mellitus 01/02/2008  . Hyperlipidemia 01/02/2008  . HTN (hypertension) 01/02/2008   Past Medical History:  Diagnosis Date  . Arthritis   . Complication of anesthesia   . Diabetes mellitus without complication (HCC)   . Heart murmur    as a child  . Hypertension   . PONV (postoperative nausea and vomiting)   . Seizures (HCC)   . Shortness of breath dyspnea    sometimes  at night    No family history on file.  Past Surgical History:  Procedure Laterality Date  . ABDOMINAL HYSTERECTOMY    . CHOLECYSTECTOMY  1995  . COLONOSCOPY    . EXCISIONAL TOTAL HIP ARTHROPLASTY WITH ANTIBIOTIC SPACERS Right 09/07/2014   Procedure: Excision Total Hip Arthroplasty, Place Antibiotic Spacer;  Surgeon: 11/07/2014, MD;  [facility];  Service: Orthopedics;  Laterality: Right;  . INCISION AND DRAINAGE HIP Right 06/08/2014   Procedure: Irrigation and Debridement Right Hip, Revision of Cup and Head of Total Hip with  Placement  of Antibiotic Spacer;  Surgeon: 14/10/2013, MD;  [facility];  Service: Orthopedics;  Laterality: Right;  . TONSILLECTOMY    . TOTAL HIP ARTHROPLASTY Right 2011  . TOTAL HIP REVISION Right 11/09/2014   Procedure: TOTAL HIP REVISION;  Surgeon: 01/09/2015, MD;  [facility];  Service: Orthopedics;  Laterality: Right;   Social History   Occupational History  . Not on file  Tobacco Use  . Smoking status: Never Smoker  . Smokeless tobacco: Never Used  Substance and Sexual Activity  .  Alcohol use: No    Alcohol/week: 0.0 standard drinks  . Drug use: No  . Sexual activity: Not on file

## 2019-10-19 ENCOUNTER — Other Ambulatory Visit: Payer: Self-pay

## 2019-10-19 ENCOUNTER — Ambulatory Visit (INDEPENDENT_AMBULATORY_CARE_PROVIDER_SITE_OTHER): Payer: Medicare Other | Admitting: Podiatry

## 2019-10-19 DIAGNOSIS — B351 Tinea unguium: Secondary | ICD-10-CM | POA: Diagnosis not present

## 2019-10-19 DIAGNOSIS — E1151 Type 2 diabetes mellitus with diabetic peripheral angiopathy without gangrene: Secondary | ICD-10-CM

## 2019-10-19 NOTE — Progress Notes (Signed)
  Subjective:  Patient ID: Kelly Richardson, female    DOB: 10-02-1950,  MRN: [id number]  Chief Complaint  Patient presents with  . Nail Problem    Nail trim 1-5 bilateral    69 y.o. female presents with the above complaint. History confirmed with patient.   Objective:  Physical Exam: warm, good capillary refill, nail exam onychomycosis of the toenails, no trophic changes or ulcerative lesions. DP pulses palpable and protective sensation intact, PT non-palpable  Assessment:   1. Onychomycosis   2. Diabetes mellitus type 2 with peripheral artery disease (HCC)      Plan:  Patient was evaluated and treated and all questions answered.  Onychomycosis, Diabetes and PAD -Patient is diabetic with a qualifying condition for at risk foot care.  Procedure: Nail Debridement Rationale: Patient meets criteria for routine foot care due to PAD Type of Debridement: manual, sharp debridement. Instrumentation: Nail nipper, rotary burr. Number of Nails: 10  No follow-ups on file.

## 2020-01-26 ENCOUNTER — Emergency Department (HOSPITAL_COMMUNITY)
Admission: EM | Admit: 2020-01-26 | Discharge: 2020-01-26 | Disposition: A | Discharge status: Home / Self Care | Payer: Medicare Other | Attending: Emergency Medicine | Admitting: Emergency Medicine

## 2020-01-26 ENCOUNTER — Emergency Department (HOSPITAL_COMMUNITY): Payer: Medicare Other

## 2020-01-26 ENCOUNTER — Encounter (HOSPITAL_COMMUNITY): Payer: Self-pay

## 2020-01-26 DIAGNOSIS — Z79899 Other long term (current) drug therapy: Secondary | ICD-10-CM | POA: Diagnosis not present

## 2020-01-26 DIAGNOSIS — G510 Bell's palsy: Secondary | ICD-10-CM | POA: Insufficient documentation

## 2020-01-26 DIAGNOSIS — E1165 Type 2 diabetes mellitus with hyperglycemia: Secondary | ICD-10-CM | POA: Diagnosis not present

## 2020-01-26 DIAGNOSIS — R29810 Facial weakness: Secondary | ICD-10-CM | POA: Diagnosis present

## 2020-01-26 DIAGNOSIS — Z882 Allergy status to sulfonamides status: Secondary | ICD-10-CM | POA: Diagnosis not present

## 2020-01-26 DIAGNOSIS — I10 Essential (primary) hypertension: Secondary | ICD-10-CM | POA: Diagnosis not present

## 2020-01-26 DIAGNOSIS — Z91040 Latex allergy status: Secondary | ICD-10-CM | POA: Insufficient documentation

## 2020-01-26 LAB — I-STAT CHEM 8, ED
BUN: 17 mg/dL (ref 8–23)
Calcium, Ion: 1.06 mmol/L — ABNORMAL LOW (ref 1.15–1.40)
Chloride: 104 mmol/L (ref 98–111)
Creatinine, Ser: 0.7 mg/dL (ref 0.44–1.00)
Glucose, Bld: 269 mg/dL — ABNORMAL HIGH (ref 70–99)
HCT: 37 % (ref 36.0–46.0)
Hemoglobin: 12.6 g/dL (ref 12.0–15.0)
Potassium: 3.8 mmol/L (ref 3.5–5.1)
Sodium: 140 mmol/L (ref 135–145)
TCO2: 27 mmol/L (ref 22–32)

## 2020-01-26 MED ORDER — PREDNISONE 20 MG PO TABS: 20.0000 mg | ORAL_TABLET | Freq: Three times a day (TID) | ORAL | 0 refills | Status: DC

## 2020-01-26 MED ORDER — VALACYCLOVIR HCL 1 G PO TABS: 1000.0000 mg | ORAL_TABLET | Freq: Three times a day (TID) | ORAL | 0 refills | Status: AC

## 2020-01-26 MED ORDER — VALACYCLOVIR HCL 500 MG PO TABS: 1000.0000 mg | ORAL_TABLET | Freq: Three times a day (TID) | ORAL | Status: DC

## 2020-01-26 MED ORDER — KETOTIFEN FUMARATE 0.025 % OP SOLN: 1.0000 [drp] | OPHTHALMIC | Status: DC | PRN

## 2020-01-26 MED ORDER — ARTIFICIAL TEARS OPHTHALMIC OINT
1.0000 "application " | TOPICAL_OINTMENT | Freq: Once | OPHTHALMIC | Status: DC
  Filled 2020-01-26: qty 3.5

## 2020-01-26 MED ORDER — PREDNISONE 20 MG PO TABS: 60.0000 mg | ORAL_TABLET | Freq: Every day | ORAL | Status: DC

## 2020-01-26 NOTE — Discharge Instructions (Addendum)
Take the medicine as prescribed to treat your symptoms of Bell's palsy.  For your left eye which is having trouble getting completely close, use a moisturizing ointment such as Lacri-Lube, until you are able to close her eye completely.  Also at nighttime, tape your left eye shut to protect it.  Return here if needed for problems.  Check with your doctor next week to make sure that your symptoms are improving.

## 2020-01-26 NOTE — ED Notes (Signed)
Provided pt with bagged lunch and ice water.

## 2020-01-26 NOTE — Code Documentation (Signed)
Pt arrived to [facility] at [phone]. She was last known well this morning at 0925 when she had a sudden inability to move the entire left side of her face. Upon arrival to [facility], she has upper and lower left facial palsy. She has no weakness, or speech or vision deficit. (later discovered that she did not know the month, but was able to fluently answer other orientation questions, and visitor states she never knows the month.) Pt taken to CT at 1000. Per neurologist, CT neg for acute abnormality. Pt not a candidate for TPA or IR due to low suspicion For stroke. Code stroke cancelled by Dr Derry Lory at 1006. Pt taken to room 1, bedside handoff with Alana RN.

## 2020-01-26 NOTE — ED Triage Notes (Signed)
Pt arrived via [facility] from home for left sided facial droop, slurred speech today at 0925. LKW [id number]. Pt is sinus tach on monitor. Pt A&Ox2 to place and person. VSS.

## 2020-01-26 NOTE — Consult Note (Addendum)
Neurology H&P  CC: Stroke code  History is obtained from:patient  HPI: Kelly Richardson is a 69 y.o. 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  She woke up fine this morning, she was in the bathroom and felt weird in her left eye. Noted that her eye was droopy and eyelid would not close.  She was brought in to the hospital as a stroke alert.  No recent hikes, or outdoor activities. Not sure if she had chicken pox, no hx of shingles.  The stroke alert was cancelled after noting the involvement of the eyelid and symptoms more consistent with Bell's palsy.  LKW: 0900 tpa given?: No, low suspicion for stroke IR Thrombectomy? No, low suspicion for a stroke Modified Rankin Scale: 0-Completely asymptomatic and back to baseline post- stroke NIHSS: 1   ROS: A complete ROS was performed and is negative except as noted in the HPI.  Past Medical History:  Diagnosis Date  . Arthritis   . Complication of anesthesia   . Diabetes mellitus without complication (HCC)   . Heart murmur    as a child  . Hypertension   . PONV (postoperative nausea and vomiting)   . Seizures (HCC)   . Shortness of breath dyspnea    sometimes  at night    Family Hx: No family hx of strokes.  Social History:  reports that she has never smoked. She has never used smokeless tobacco. She reports that she does not drink alcohol and does not use drugs.  Prior to Admission medications   Medication Sig Start Date End Date Taking? Authorizing Provider  ACCU-CHEK AVIVA PLUS test strip 1 each by Other route daily. 05/02/19   [provider]  acetaminophen (TYLENOL) 325 MG tablet Take 650 mg by mouth every 6 (six) hours as needed for mild pain.    [provider]  aspirin 81 MG chewable tablet Chew 81 mg by mouth every evening.     [provider]  atorvastatin (LIPITOR) 20 MG tablet Take 20 mg by  mouth every evening.  03/11/19   [provider]  calcium elemental as carbonate (BARIATRIC TUMS ULTRA) 400 MG chewable tablet Chew by mouth.    [provider]  Cholecalciferol 25 MCG (1000 UT) tablet Take by mouth.    [provider]  ferrous sulfate 325 (65 FE) MG tablet Take 1 tablet (325 mg total) by mouth daily with breakfast. 05/27/19   Burnadette Pop, MD  insulin aspart (NOVOLOG FLEXPEN) 100 UNIT/ML FlexPen Inject 15 Units as directed 3 (three) times daily with meals. Per sliding scale and not to exceed 81 units daily.  04/05/19   [provider]  Insulin Detemir (LEVEMIR FLEXTOUCH) 100 UNIT/ML Pen Inject 40 Units into the skin at bedtime. 12/09/16   Pricilla Loveless, MD  Insulin Pen Needle (FIFTY50 PEN NEEDLES) 31G X 8 MM MISC 1 each by Other route daily. 02/15/18   [provider]  ketoconazole (NIZORAL) 2 % shampoo USE TWICE A WEEK TO SCALP X 4 WEEKS, THEN AS NEEDED 06/22/19   [provider]  lidocaine (LIDODERM) 5 % Place 1 patch onto the skin daily. Remove & Discard patch within 12 hours or as directed by MD 05/18/19   Alvira Monday, MD  lisinopril (PRINIVIL,ZESTRIL) 2.5 MG tablet Take 2.5 mg by mouth every evening.  06/24/17   [provider]  ondansetron (ZOFRAN) 4 MG  tablet Take 4 mg by mouth every 8 (eight) hours as needed for nausea or vomiting.    [provider]  traMADol (ULTRAM) 50 MG tablet Take 1 tablet (50 mg total) by mouth every 6 (six) hours as needed. Patient taking differently: Take 50 mg by mouth every 6 (six) hours as needed for moderate pain.  05/26/19   Burnadette Pop, MD     Exam: Current vital signs: Temp 98.6 F (37 C) (Oral)    Physical Exam  Constitutional: Appears well-developed and well-nourished.  Psych: Affect appropriate to situation Eyes: No scleral injection HENT: No OP obstrucion Head: Normocephalic.  Cardiovascular: Normal rate and regular rhythm.  Respiratory: Effort  normal and breath sounds normal to anterior ascultation GI: Soft.  No distension. There is no tenderness.  Skin: WDI  Neuro: Mental Status: Patient is awake, alert, oriented to person, place, month, year, and situation Patient is able to give a clear and coherent history No signs of aphasia or neglect Mild slurred speech. Cranial Nerves: II: Visual Fields are full. Pupils are equal, round, and reactive to light.  III,IV, VI: EOMI + left ptosis no diploplia.  V: Facial sensation is symmetric to temperature VII: Left facial droop with flattening of the L nasolabial fold. VIII: hearing is intact to voice X: Uvula elevates symmetrically XI: Shoulder shrug is symmetric. XII: tongue is midline without atrophy or fasciculations.  Motor: Tone is normal. Bulk is normal. 5/5 strength was present in all four extremities. Sensory: Sensation is symmetric to light touch and temperature in the arms and legs Deep Tendon Reflexes: 2+ and symmetric in the biceps and patellae. Plantars: Toes are downgoing bilaterally. Cerebellar: FNF and HKS are intact bilaterally  I have reviewed the images obtained: CTH with no acute abnormalities.  Primary Diagnosis:  Left Bell's Palsy  Impression: 14 f presenting with acute onset L facial droop with involvement of the L eyelid consistent with left bell's palsy. Denies any risk factors for Lyme or Herpes. Exam with L facial droop consistent with Housebrackmann class IV. No vesicles in the ear.  Stroke code was cancelled given patient's symptoms are consistent with Bell's palsy.  Plan: - Serum Lyme Ab - I ordered Prednisone 60mg  Daily x 7 days - I ordered Valacyclovir 1000mg  TID x 7 days. - Tape eye tight shut at night to prevent dryness. - Refresh eye drops during the day. - Follow up with PCP to follow up on Lyme Ab and ophthalmology to ensure no eye dryness.   [facility] Pager Number 

## 2020-01-26 NOTE — ED Provider Notes (Signed)
[facility] EMERGENCY DEPARTMENT Provider Note   CSN: [id number] Arrival date & time: 01/26/20  [id number]  An emergency department physician performed an initial assessment on this suspected stroke patient at [facility]).  History Chief Complaint  Patient presents with  . Facial Droop    Kelly Richardson is a 69 y.o. female. Patient seen on arrival at the [facility], while being evaluated by neuro hospitalist. HPI She presents by EMS for evaluation of left facial asymmetry, noticed about 30 minutes prior to arrival.  Presents as code stroke by EMS, with neuro hospitalist assessment on arrival.  Initial suspicion is for Bell's palsy, evaluation to proceed with CT imaging, without contrast, CT angiogram, and expected MRI imaging to follow.  Patient states that she has no other acute abnormalities besides the left facial weakness.  She denies headache, altered taste, fever, chills, difficulty moving arms, legs or walking.  She has had a feeling that her left ear is "blocked," for several weeks.  No prior similar problems.  No other recent illnesses.  She is retired.  There are no other known modifying factors.    Past Medical History:  Diagnosis Date  . Arthritis   . Complication of anesthesia   . Diabetes mellitus without complication (HCC)   . Heart murmur    as a child  . Hypertension   . PONV (postoperative nausea and vomiting)   . Seizures (HCC)   . Shortness of breath dyspnea    sometimes  at night    Patient Active Problem List   Diagnosis Date Noted  . Aortic atherosclerosis (HCC) 07/14/2019  . Uncontrolled type 2 diabetes mellitus with hyperglycemia (HCC) 05/30/2019  . Acute encephalopathy 05/29/2019  . Humerus fracture 05/23/2019  . Closed lumbar vertebral fracture (HCC) 05/23/2019  . Leukocytosis 05/23/2019  . Hyperglycemia 05/23/2019  . Fall 05/23/2019  . Obesity (BMI 30-39.9) 08/07/2018  . Gait disturbance 12/24/2015  . Health care maintenance  04/09/2015  . H/O total hip arthroplasty 11/09/2014  . Presence of unspecified artificial hip joint 11/09/2014  . Osteomyelitis of right hip (HCC) 07/12/2014  . Chest pain 06/22/2014  . Anemia due to blood loss 06/20/2014  . Status post revision of total hip replacement 06/08/2014  . Anxiety state 03/05/2008  . FATIGUE 03/05/2008  . Uncontrolled insulin dependent diabetes mellitus 01/02/2008  . Hyperlipidemia 01/02/2008  . HTN (hypertension) 01/02/2008    Past Surgical History:  Procedure Laterality Date  . ABDOMINAL HYSTERECTOMY    . CHOLECYSTECTOMY  1995  . COLONOSCOPY    . EXCISIONAL TOTAL HIP ARTHROPLASTY WITH ANTIBIOTIC SPACERS Right 09/07/2014   Procedure: Excision Total Hip Arthroplasty, Place Antibiotic Spacer;  Surgeon: Nadara Mustard, MD;  Location: [facility] OR;  Service: Orthopedics;  Laterality: Right;  . INCISION AND DRAINAGE HIP Right 06/08/2014   Procedure: Irrigation and Debridement Right Hip, Revision of Cup and Head of Total Hip with  Placement  of Antibiotic Spacer;  Surgeon: Nadara Mustard, MD;  Location: [facility] OR;  Service: Orthopedics;  Laterality: Right;  . TONSILLECTOMY    . TOTAL HIP ARTHROPLASTY Right 2011  . TOTAL HIP REVISION Right 11/09/2014   Procedure: TOTAL HIP REVISION;  Surgeon: Nadara Mustard, MD;  Location: [facility] OR;  Service: Orthopedics;  Laterality: Right;     OB History   No obstetric history on file.     No family history on file.  Social History   Tobacco Use  . Smoking status: Never Smoker  . Smokeless tobacco: Never  Used  Substance Use Topics  . Alcohol use: No    Alcohol/week: 0.0 standard drinks  . Drug use: No    Home Medications Prior to Admission medications   Medication Sig Start Date End Date Taking? Authorizing Provider  ACCU-CHEK AVIVA PLUS test strip 1 each by Other route daily. 05/02/19   [provider]  acetaminophen (TYLENOL) 325 MG tablet Take 650 mg by mouth every 6 (six) hours as needed for mild pain.    [provider]  aspirin 81 MG chewable tablet Chew 81 mg by mouth every evening.     [provider]  atorvastatin (LIPITOR) 20 MG tablet Take 20 mg by mouth every evening.  03/11/19   [provider]  calcium elemental as carbonate (BARIATRIC TUMS ULTRA) 400 MG chewable tablet Chew by mouth.    [provider]  Cholecalciferol 25 MCG (1000 UT) tablet Take by mouth.    [provider]  ferrous sulfate 325 (65 FE) MG tablet Take 1 tablet (325 mg total) by mouth daily with breakfast. 05/27/19   Burnadette Pop, MD  insulin aspart (NOVOLOG FLEXPEN) 100 UNIT/ML FlexPen Inject 15 Units as directed 3 (three) times daily with meals. Per sliding scale and not to exceed 81 units daily.  04/05/19   [provider]  Insulin Detemir (LEVEMIR FLEXTOUCH) 100 UNIT/ML Pen Inject 40 Units into the skin at bedtime. 12/09/16   Pricilla Loveless, MD  Insulin Pen Needle (FIFTY50 PEN NEEDLES) 31G X 8 MM MISC 1 each by Other route daily. 02/15/18   [provider]  ketoconazole (NIZORAL) 2 % shampoo USE TWICE A WEEK TO SCALP X 4 WEEKS, THEN AS NEEDED 06/22/19   [provider]  lidocaine (LIDODERM) 5 % Place 1 patch onto the skin daily. Remove & Discard patch within 12 hours or as directed by MD 05/18/19   Alvira Monday, MD  lisinopril (PRINIVIL,ZESTRIL) 2.5 MG tablet Take 2.5 mg by mouth every evening.  06/24/17   [provider]  ondansetron (ZOFRAN) 4 MG tablet Take 4 mg by mouth every 8 (eight) hours as needed for nausea or vomiting.    [provider]  predniSONE (DELTASONE) 20 MG tablet Take 1 tablet (20 mg total) by mouth in the morning, at noon, and at bedtime. 01/26/20   [name], MD  traMADol (ULTRAM) 50 MG tablet Take 1 tablet (50 mg total) by mouth every 6 (six) hours as needed. Patient taking differently: Take 50 mg by mouth every 6 (six) hours as needed for moderate pain.  05/26/19   Burnadette Pop, MD  valACYclovir (VALTREX)  1000 MG tablet Take 1 tablet (1,000 mg total) by mouth 3 (three) times daily for 14 days. 01/26/20 02/09/20  [name], MD    Allergies    Metformin, Penicillins, Seldane [terfenadine], Sulfonamide derivatives, Vicks formula 44 cough relief [dextromethorphan hbr], Xanax [alprazolam], Iohexol, Latex, Other, and Percocet [oxycodone-acetaminophen]  Review of Systems   Review of Systems  All other systems reviewed and are negative.   Physical Exam Updated Vital Signs BP (!) 155/84   Pulse (!) 109   Temp 98.6 F (37 C) (Oral)   Resp 16   Ht 5\' 2"  (1.575 m)   Wt (!) 99.8 kg   SpO2 99%   BMI 40.24 kg/m   Physical Exam Vitals and nursing note reviewed.  Constitutional:      General: She is not in acute distress.    Appearance: She is well-developed. She is obese. She  is not ill-appearing or toxic-appearing.  HENT:     Head: Normocephalic and atraumatic.     Right Ear: External ear normal.     Left Ear: External ear normal.  Eyes:     Conjunctiva/sclera: Conjunctivae normal.     Pupils: Pupils are equal, round, and reactive to light.  Neck:     Trachea: Phonation normal.  Cardiovascular:     Rate and Rhythm: Normal rate and regular rhythm.     Heart sounds: Normal heart sounds.  Pulmonary:     Effort: Pulmonary effort is normal.     Breath sounds: Normal breath sounds.  Abdominal:     General: There is no distension.     Palpations: Abdomen is soft.     Tenderness: There is no abdominal tenderness.  Musculoskeletal:        General: Normal range of motion.     Cervical back: Normal range of motion and neck supple.  Skin:    General: Skin is warm and dry.  Neurological:     Mental Status: She is alert and oriented to person, place, and time.     Cranial Nerves: Cranial nerve deficit present.     Sensory: No sensory deficit.     Motor: No abnormal muscle tone.     Coordination: Coordination normal.     Comments: Left facial asymmetry with weakness, forehead and cheek.   No dysarthria, aphasia, nystagmus, pronator drift or ataxia.  Psychiatric:        Mood and Affect: Mood normal.        Behavior: Behavior normal.        Thought Content: Thought content normal.        Judgment: Judgment normal.     ED Results / Procedures / Treatments   Labs (all labs ordered are listed, but only abnormal results are displayed) Labs Reviewed  I-STAT CHEM 8, ED - Abnormal; Notable for the following components:      Result Value   Glucose, Bld 269 (*)    Calcium, Ion 1.06 (*)    All other components within normal limits  B. BURGDORFI ANTIBODIES    EKG EKG Interpretation  Date/Time:  Friday January 26 2020 10:15:52 EDT Ventricular Rate:  109 PR Interval:    QRS Duration: 76 QT Interval:  329 QTC Calculation: 443 R Axis:   46 Text Interpretation: Sinus tachycardia Low voltage, precordial leads since last tracing no significant change Confirmed by Mancel BaleWentz, Erryn Dickison [phone]([id number]) on 01/26/2020 10:36:36 AM   Radiology CT HEAD CODE STROKE WO CONTRAST  Addendum Date: 01/26/2020   ADDENDUM REPORT: 01/26/2020 10:39 ADDENDUM: These results were called by telephone at the time of interpretation on 01/26/2020 at 10:26 am to provider [facility] , who verbally acknowledged these results. Electronically Signed   By: Jackey LogeKyle  Golden DO   On: 01/26/2020 10:39   Result Date: 01/26/2020 CLINICAL DATA:  Code stroke. Left-sided facial droop, slurred speech. EXAM: CT HEAD WITHOUT CONTRAST TECHNIQUE: Contiguous axial images were obtained from the base of the skull through the vertex without intravenous contrast. COMPARISON:  MRI/MRA head 05/29/2019, head CT 05/29/2019. FINDINGS: Brain: Cerebral volume is normal. Mild ill-defined hypoattenuation within the cerebral white matter is nonspecific, but consistent with chronic small vessel ischemic disease. Findings are similar as compared to the prior MRI of 05/29/2019. Redemonstrated prominent perivascular spaces within the inferior basal ganglia.  No acute intracranial hemorrhage is demonstrated. No acute demarcated cortical infarction is identified. Vascular: No hyperdense vessel.  Atherosclerotic calcifications. Skull:  No calvarial fracture or focal suspicious osseous lesion. Sinuses/Orbits: Visualized orbits show no acute finding. Minimal frothy secretions within a posterior right ethmoid air cell. No significant mastoid effusion. ASPECTS [facility] Stroke Program Early CT Score) - Ganglionic level infarction (caudate, lentiform nuclei, internal capsule, insula, M1-M3 cortex): 7 - Supraganglionic infarction (M4-M6 cortex): 3 Total score (0-10 with 10 being normal): 10 IMPRESSION: No CT evidence of acute intracranial abnormality.  ASPECTS is 10. Stable, mild chronic small vessel ischemic disease. Right ethmoid sinusitis. Electronically Signed: By: Jackey Loge DO On: 01/26/2020 10:18    Procedures Procedures (including critical care time)  Medications Ordered in ED Medications  predniSONE (DELTASONE) tablet 60 mg (60 mg Oral Not Given 01/26/20 1045)  valACYclovir (VALTREX) tablet 1,000 mg (1,000 mg Oral Not Given 01/26/20 1045)  artificial tears (LACRILUBE) ophthalmic ointment 1 application (has no administration in time range)    ED Course  I have reviewed the triage vital signs and the nursing notes.  Pertinent labs & imaging results that were available during my care of the patient were reviewed by me and considered in my medical decision making (see chart for details).  Clinical Course as of Jan 25 1234  Fri Jan 26, 2020  1035 Negative code stroke head CT per radiologist.  CT HEAD CODE STROKE WO CONTRAST [EW]  1050 Case discussed with neuro hospitalist who states patient does not require additional imaging, or evaluation.  He states that patient should follow-up with ophthalmology, and PCP for management of Bell's palsy symptoms.   [EW]  1127 Normal except glucose high, calcium low  I-stat chem 8, ed(!) [EW]  1127 B. burgdorfi  antibodies [EW]    Clinical Course User Index [EW] [name], MD   MDM Rules/Calculators/A&P                           Patient Vitals for the past 24 hrs:  BP Temp Temp src Pulse Resp SpO2 Height Weight  01/26/20 1116 (!) 155/84 -- -- (!) 109 16 99 % -- --  01/26/20 1020 -- -- -- -- -- -- 5\' 2"  (1.575 m) (!) 99.8 kg  01/26/20 1019 121/71 98.6 F (37 C) Oral (!) 117 18 98 % -- --  01/26/20 1017 -- 98.6 F (37 C) Oral -- -- -- -- --    11:26 AM Reevaluation with update and discussion. After initial assessment and treatment, an updated evaluation reveals no change in clinical status.  Findings discussed with patient, and husband, all questions answered. 01/28/20   Medical Decision Making:  This patient is presenting for evaluation of facial weakness, which does require a range of treatment options, and is a complaint that involves a high risk of morbidity and mortality. The differential diagnoses include CVA, Bell's palsy, viral infection. I decided to review old records, and in summary healthy elderly female with multiple medical problems.  I did not require additional historical information from anyone.  Clinical Laboratory Tests Ordered, included Metabolic panel. Review indicates hyperglycemia, calcium low. Radiologic Tests Ordered, included CT head.  I independently Visualized: Radiographic images, which show no CVA  Cardiac Monitor Tracing which shows normal sinus rhythm   Critical Interventions-clinical evaluation, CT imaging, laboratory testing, observation and reassessment  After These Interventions, the Patient was reevaluated and was found stable for discharge.  Signs and symptoms and clinical evaluation is consistent with Bell's palsy.  No evidence for vesicles in left ear.  No indication for immediate treatment  or intervention.  CRITICAL CARE-no Performed by: [name]  Nursing Notes Reviewed/ Care Coordinated Applicable Imaging  Reviewed Interpretation of Laboratory Data incorporated into ED treatment  The patient appears reasonably screened and/or stabilized for discharge and I doubt any other medical condition or other [facility] requiring further screening, evaluation, or treatment in the ED at this time prior to discharge.  Plan: Home Medications-continue usual; Home Treatments-rest, fluids, tape eye shut at night and use Lacri-Lube to prevent dryness/injury; return here if the recommended treatment, does not improve the symptoms; Recommended follow up-PCP 1 week for checkup.     Final Clinical Impression(s) / ED Diagnoses Final diagnoses:  Bell's palsy    Rx / DC Orders ED Discharge Orders         Ordered    predniSONE (DELTASONE) 20 MG tablet  3 times daily     Discontinue  Reprint     01/26/20 1228    valACYclovir (VALTREX) 1000 MG tablet  3 times daily     Discontinue  Reprint     01/26/20 1228           [name], MD 01/26/20 1236

## 2020-01-29 LAB — B. BURGDORFI ANTIBODIES: B burgdorferi Ab IgG+IgM: <0.91 {ISR} (ref 0.00–0.90)

## 2020-06-21 ENCOUNTER — Other Ambulatory Visit: Payer: Self-pay

## 2020-06-21 ENCOUNTER — Ambulatory Visit (INDEPENDENT_AMBULATORY_CARE_PROVIDER_SITE_OTHER): Payer: Medicare Other | Admitting: Podiatry

## 2020-06-21 DIAGNOSIS — B351 Tinea unguium: Secondary | ICD-10-CM

## 2020-06-21 DIAGNOSIS — E1151 Type 2 diabetes mellitus with diabetic peripheral angiopathy without gangrene: Secondary | ICD-10-CM

## 2020-06-21 DIAGNOSIS — E1169 Type 2 diabetes mellitus with other specified complication: Secondary | ICD-10-CM

## 2020-07-05 NOTE — Progress Notes (Signed)
  Subjective:  Patient ID: Kelly Richardson, female    DOB: 1950-07-10,  MRN: [id number]  Chief Complaint  Patient presents with  . Nail Problem    Nail trim 1-5 bilateral    69 y.o. female presents with the above complaint. History confirmed with patient.   Objective:  Physical Exam: warm, good capillary refill, nail exam onychomycosis of the toenails, no trophic changes or ulcerative lesions. DP pulses palpable and protective sensation intact, PT non-palpable  Assessment:   1. Onychomycosis of multiple toenails with type 2 diabetes mellitus and peripheral angiopathy (HCC)    Plan:  Patient was evaluated and treated and all questions answered.  Onychomycosis, Diabetes and PAD -Patient is diabetic with a qualifying condition for at risk foot care.   Procedure: Nail Debridement Type of Debridement: manual, sharp debridement. Instrumentation: Nail nipper, rotary burr. Number of Nails: 10     No follow-ups on file.

## 2022-08-22 ENCOUNTER — Observation Stay (HOSPITAL_COMMUNITY): Payer: Medicare Other

## 2022-08-22 ENCOUNTER — Other Ambulatory Visit: Payer: Self-pay

## 2022-08-22 ENCOUNTER — Emergency Department (HOSPITAL_COMMUNITY): Payer: Medicare Other

## 2022-08-22 ENCOUNTER — Encounter (HOSPITAL_COMMUNITY): Payer: Self-pay

## 2022-08-22 ENCOUNTER — Inpatient Hospital Stay (HOSPITAL_COMMUNITY)
Admission: EM | Admit: 2022-08-22 | Discharge: 2022-08-24 | DRG: 639 | Disposition: A | Discharge status: Home / Self Care | Payer: Medicare Other | Attending: Internal Medicine | Admitting: Internal Medicine

## 2022-08-22 DIAGNOSIS — Z96649 Presence of unspecified artificial hip joint: Secondary | ICD-10-CM | POA: Diagnosis present

## 2022-08-22 DIAGNOSIS — D72829 Elevated white blood cell count, unspecified: Secondary | ICD-10-CM | POA: Diagnosis not present

## 2022-08-22 DIAGNOSIS — Z794 Long term (current) use of insulin: Secondary | ICD-10-CM

## 2022-08-22 DIAGNOSIS — Z9109 Other allergy status, other than to drugs and biological substances: Secondary | ICD-10-CM

## 2022-08-22 DIAGNOSIS — W19XXXA Unspecified fall, initial encounter: Secondary | ICD-10-CM | POA: Diagnosis present

## 2022-08-22 DIAGNOSIS — W2209XA Striking against other stationary object, initial encounter: Secondary | ICD-10-CM | POA: Diagnosis present

## 2022-08-22 DIAGNOSIS — R739 Hyperglycemia, unspecified: Secondary | ICD-10-CM

## 2022-08-22 DIAGNOSIS — Z6836 Body mass index (BMI) 36.0-36.9, adult: Secondary | ICD-10-CM

## 2022-08-22 DIAGNOSIS — Z880 Allergy status to penicillin: Secondary | ICD-10-CM

## 2022-08-22 DIAGNOSIS — Z7952 Long term (current) use of systemic steroids: Secondary | ICD-10-CM

## 2022-08-22 DIAGNOSIS — I10 Essential (primary) hypertension: Secondary | ICD-10-CM | POA: Diagnosis not present

## 2022-08-22 DIAGNOSIS — Z882 Allergy status to sulfonamides status: Secondary | ICD-10-CM

## 2022-08-22 DIAGNOSIS — G40909 Epilepsy, unspecified, not intractable, without status epilepticus: Secondary | ICD-10-CM | POA: Diagnosis present

## 2022-08-22 DIAGNOSIS — E669 Obesity, unspecified: Secondary | ICD-10-CM | POA: Diagnosis present

## 2022-08-22 DIAGNOSIS — E860 Dehydration: Secondary | ICD-10-CM | POA: Diagnosis present

## 2022-08-22 DIAGNOSIS — R55 Syncope and collapse: Secondary | ICD-10-CM | POA: Diagnosis not present

## 2022-08-22 DIAGNOSIS — Z7984 Long term (current) use of oral hypoglycemic drugs: Secondary | ICD-10-CM

## 2022-08-22 DIAGNOSIS — Z888 Allergy status to other drugs, medicaments and biological substances status: Secondary | ICD-10-CM

## 2022-08-22 DIAGNOSIS — Z7982 Long term (current) use of aspirin: Secondary | ICD-10-CM

## 2022-08-22 DIAGNOSIS — R8281 Pyuria: Secondary | ICD-10-CM | POA: Diagnosis present

## 2022-08-22 DIAGNOSIS — R300 Dysuria: Secondary | ICD-10-CM | POA: Diagnosis present

## 2022-08-22 DIAGNOSIS — E1165 Type 2 diabetes mellitus with hyperglycemia: Secondary | ICD-10-CM | POA: Diagnosis not present

## 2022-08-22 DIAGNOSIS — Y92009 Unspecified place in unspecified non-institutional (private) residence as the place of occurrence of the external cause: Secondary | ICD-10-CM

## 2022-08-22 DIAGNOSIS — Z91040 Latex allergy status: Secondary | ICD-10-CM

## 2022-08-22 DIAGNOSIS — Z886 Allergy status to analgesic agent status: Secondary | ICD-10-CM

## 2022-08-22 DIAGNOSIS — Z79899 Other long term (current) drug therapy: Secondary | ICD-10-CM

## 2022-08-22 LAB — COMPREHENSIVE METABOLIC PANEL
ALT: 30 U/L (ref 0–44)
AST: 39 U/L (ref 15–41)
Albumin: 3.5 g/dL (ref 3.5–5.0)
Alkaline Phosphatase: 84 U/L (ref 38–126)
Anion gap: 14 (ref 5–15)
BUN: 13 mg/dL (ref 8–23)
CO2: 21 mmol/L — ABNORMAL LOW (ref 22–32)
Calcium: 9 mg/dL (ref 8.9–10.3)
Chloride: 99 mmol/L (ref 98–111)
Creatinine, Ser: 1 mg/dL (ref 0.44–1.00)
GFR, Estimated: >60 mL/min (ref >60)
Glucose, Bld: 337 mg/dL — ABNORMAL HIGH (ref 70–99)
Potassium: 4.4 mmol/L (ref 3.5–5.1)
Sodium: 134 mmol/L — ABNORMAL LOW (ref 135–145)
Total Bilirubin: 1 mg/dL (ref 0.3–1.2)
Total Protein: 7.4 g/dL (ref 6.5–8.1)

## 2022-08-22 LAB — CBC WITH DIFFERENTIAL/PLATELET
Abs Immature Granulocytes: 0.1 10*3/uL — ABNORMAL HIGH (ref 0.00–0.07)
Basophils Absolute: 0 10*3/uL (ref 0.0–0.1)
Basophils Relative: 0 %
Eosinophils Absolute: 0.1 10*3/uL (ref 0.0–0.5)
Eosinophils Relative: 1 %
HCT: 43.9 % (ref 36.0–46.0)
Hemoglobin: 13.1 g/dL (ref 12.0–15.0)
Immature Granulocytes: 1 %
Lymphocytes Relative: 11 %
Lymphs Abs: 1.6 10*3/uL (ref 0.7–4.0)
MCH: 23.4 pg — ABNORMAL LOW (ref 26.0–34.0)
MCHC: 29.8 g/dL — ABNORMAL LOW (ref 30.0–36.0)
MCV: 78.5 fL — ABNORMAL LOW (ref 80.0–100.0)
Monocytes Absolute: 1.2 10*3/uL — ABNORMAL HIGH (ref 0.1–1.0)
Monocytes Relative: 9 %
Neutro Abs: 11.2 10*3/uL — ABNORMAL HIGH (ref 1.7–7.7)
Neutrophils Relative %: 78 %
Platelets: 245 10*3/uL (ref 150–400)
RBC: 5.59 MIL/uL — ABNORMAL HIGH (ref 3.87–5.11)
RDW: 18.2 % — ABNORMAL HIGH (ref 11.5–15.5)
WBC: 14.2 10*3/uL — ABNORMAL HIGH (ref 4.0–10.5)
nRBC: 0 % (ref 0.0–0.2)

## 2022-08-22 LAB — I-STAT VENOUS BLOOD GAS, ED
Acid-Base Excess: 0 mmol/L (ref 0.0–2.0)
Bicarbonate: 25.4 mmol/L (ref 20.0–28.0)
Calcium, Ion: 1.06 mmol/L — ABNORMAL LOW (ref 1.15–1.40)
HCT: 43 % (ref 36.0–46.0)
Hemoglobin: 14.6 g/dL (ref 12.0–15.0)
O2 Saturation: 96 %
Potassium: 4.3 mmol/L (ref 3.5–5.1)
Sodium: 136 mmol/L (ref 135–145)
TCO2: 27 mmol/L (ref 22–32)
pCO2, Ven: 42.5 mmHg — ABNORMAL LOW (ref 44–60)
pH, Ven: 7.384 (ref 7.25–7.43)
pO2, Ven: 85 mmHg — ABNORMAL HIGH (ref 32–45)

## 2022-08-22 LAB — TROPONIN I (HIGH SENSITIVITY)
Troponin I (High Sensitivity): 7 ng/L (ref <18)
Troponin I (High Sensitivity): 7 ng/L (ref <18)

## 2022-08-22 LAB — CBG MONITORING, ED
Glucose-Capillary: 326 mg/dL — ABNORMAL HIGH (ref 70–99)
Glucose-Capillary: 338 mg/dL — ABNORMAL HIGH (ref 70–99)
Glucose-Capillary: 266 mg/dL — ABNORMAL HIGH (ref 70–99)

## 2022-08-22 LAB — BETA-HYDROXYBUTYRIC ACID: Beta-Hydroxybutyric Acid: 0.22 mmol/L (ref 0.05–0.27)

## 2022-08-22 LAB — HIV ANTIBODY (ROUTINE TESTING W REFLEX): HIV Screen 4th Generation wRfx: NONREACTIVE

## 2022-08-22 LAB — HEMOGLOBIN A1C
Hgb A1c MFr Bld: 9.8 % — ABNORMAL HIGH (ref 4.8–5.6)
Mean Plasma Glucose: 234.56 mg/dL

## 2022-08-22 LAB — GLUCOSE, CAPILLARY
Glucose-Capillary: 183 mg/dL — ABNORMAL HIGH (ref 70–99)
Glucose-Capillary: 373 mg/dL — ABNORMAL HIGH (ref 70–99)
Glucose-Capillary: 252 mg/dL — ABNORMAL HIGH (ref 70–99)

## 2022-08-22 LAB — CK: Total CK: 76 U/L (ref 38–234)

## 2022-08-22 MED ORDER — ACETAMINOPHEN 325 MG PO TABS: 650.0000 mg | ORAL_TABLET | Freq: Four times a day (QID) | ORAL | Status: DC | PRN

## 2022-08-22 MED ORDER — LACTATED RINGERS IV BOLUS
1000.0000 mL | Freq: Once | INTRAVENOUS | Status: AC
  Administered 2022-08-22: 1000 mL via INTRAVENOUS

## 2022-08-22 MED ORDER — SODIUM CHLORIDE 0.9 % IV SOLN: INTRAVENOUS | Status: AC

## 2022-08-22 MED ORDER — SODIUM CHLORIDE 0.9 % IV SOLN: INTRAVENOUS | Status: DC

## 2022-08-22 MED ORDER — INSULIN DETEMIR 100 UNIT/ML ~~LOC~~ SOLN
40.0000 [IU] | Freq: Every day | SUBCUTANEOUS | Status: DC
  Administered 2022-08-22 – 2022-08-23 (×2): 40 [IU] via SUBCUTANEOUS
  Filled 2022-08-22 (×3): qty 0.4

## 2022-08-22 MED ORDER — ONDANSETRON HCL 4 MG PO TABS: 4.0000 mg | ORAL_TABLET | Freq: Four times a day (QID) | ORAL | Status: DC | PRN

## 2022-08-22 MED ORDER — ENOXAPARIN SODIUM 40 MG/0.4ML IJ SOSY
40.0000 mg | PREFILLED_SYRINGE | INTRAMUSCULAR | Status: DC
  Administered 2022-08-22 – 2022-08-24 (×3): 40 mg via SUBCUTANEOUS
  Filled 2022-08-22 (×3): qty 0.4

## 2022-08-22 MED ORDER — ACETAMINOPHEN 650 MG RE SUPP: 650.0000 mg | Freq: Four times a day (QID) | RECTAL | Status: DC | PRN

## 2022-08-22 MED ORDER — SODIUM CHLORIDE 0.9% FLUSH
3.0000 mL | Freq: Two times a day (BID) | INTRAVENOUS | Status: DC
  Administered 2022-08-23 – 2022-08-24 (×2): 3 mL via INTRAVENOUS

## 2022-08-22 MED ORDER — HYDRALAZINE HCL 20 MG/ML IJ SOLN: 10.0000 mg | INTRAMUSCULAR | Status: DC | PRN

## 2022-08-22 MED ORDER — ONDANSETRON HCL 4 MG/2ML IJ SOLN: 4.0000 mg | Freq: Four times a day (QID) | INTRAMUSCULAR | Status: DC | PRN

## 2022-08-22 MED ORDER — INSULIN ASPART 100 UNIT/ML IJ SOLN
0.0000 [IU] | Freq: Three times a day (TID) | INTRAMUSCULAR | Status: DC
  Administered 2022-08-22: 11 [IU] via SUBCUTANEOUS
  Administered 2022-08-22: 20 [IU] via SUBCUTANEOUS
  Administered 2022-08-22: 4 [IU] via SUBCUTANEOUS
  Administered 2022-08-23 (×2): 11 [IU] via SUBCUTANEOUS
  Administered 2022-08-23 – 2022-08-24 (×2): 7 [IU] via SUBCUTANEOUS
  Administered 2022-08-24: 4 [IU] via SUBCUTANEOUS

## 2022-08-22 MED ORDER — ALBUTEROL SULFATE (2.5 MG/3ML) 0.083% IN NEBU: 2.5000 mg | INHALATION_SOLUTION | Freq: Four times a day (QID) | RESPIRATORY_TRACT | Status: DC | PRN

## 2022-08-22 MED ORDER — INSULIN ASPART 100 UNIT/ML IJ SOLN
0.0000 [IU] | Freq: Every day | INTRAMUSCULAR | Status: DC
  Administered 2022-08-22: 3 [IU] via SUBCUTANEOUS
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
## 2022-08-22 NOTE — ED Notes (Signed)
Attempted to get pt up; pt c/o of being dizzy.

## 2022-08-22 NOTE — H&P (Signed)
History and Physical    Patient: Kelly Richardson [id number] DOB: [birth year] DOA: 08/22/2022 DOS: the patient was seen and examined on 08/22/2022 PCP: Berkley Harvey, NP  Patient coming from: Home via EMS  Chief Complaint:  Chief Complaint  Patient presents with   Fall   HPI: Kelly Richardson is a 72 y.o. female with medical history significant of hypertension, diabetes mellitus type 2, and history of seizures related to medication who presents after having a fall earlier this morning.  She had went to let the cat into the house and remembers closing the door and walking back to the bedroom when she fell.  Patient reports that she fell and hit the right side of her head against against her bedroom door.  To her knowledge she did not lose consciousness, but after doing so was unable to get back up despite trying to use the nightstand, and had to crawl into the bedroom and call 911 for help.  Patient felt she was on the floor approximately 15 minutes or so where she was able to call for help.  She is not totally clear on why she fell.  Patient does not recall having lightheaded/dizzy, chest pain,  palpitations, or tongue biting.  She is not on blood thinners patient reports that she did not feel like she tripped over the cat.  She complained of having some pain in her neck, back, and right ankle is related to the fall.  She normally gets around without need of assistance and her boyfriend lives with her, but works at [employer] for which he was not there when this occurred.  She denies having any recent nausea, vomiting, diarrhea, or dysuria symptoms.  She will  EMS reported concerns over cleanliness of the house as there was urine and feces throughout the home and the patient was covered in urine and feces herself.  When asked in regards to this patient states that she did not urinate or defecate on herself and that her home did not have urine or feces present.  She thinks that EMS got confused with  someone else's home.  Patient was noted to be afebrile with heart rates initially 97-102, and all other vital signs relatively maintained.  Labs significant for WBC 14.2, sodium 134, BUN 13, creatinine 1, glucose 337, anion gap 14, CK 76, and high-sensitivity troponins negative x 2.  X-rays of the chest and right ankle did not show any acute abnormality.  CT imaging of the head and cervical spine noted no intracranial or cervical injury.  TRH was called to admit for observation as to the cause of patient's fall. Review of Systems: As mentioned in the history of present illness. All other systems reviewed and are negative. Past Medical History:  Diagnosis Date   Arthritis    Complication of anesthesia    Diabetes mellitus without complication ([place])    Heart murmur    as a child   Hypertension    PONV (postoperative nausea and vomiting)    Seizures (HCC)    Shortness of breath dyspnea    sometimes  at night   Past Surgical History:  Procedure Laterality Date   ABDOMINAL HYSTERECTOMY     CHOLECYSTECTOMY  1995   COLONOSCOPY     EXCISIONAL TOTAL HIP ARTHROPLASTY WITH ANTIBIOTIC SPACERS Right 09/07/2014   Procedure: Excision Total Hip Arthroplasty, Place Antibiotic Spacer;  Surgeon: Newt Minion, MD;  Location: [facility];  Service: Orthopedics;  Laterality: Right;   INCISION AND DRAINAGE  HIP Right 06/08/2014   Procedure: Irrigation and Debridement Right Hip, Revision of Cup and Head of Total Hip with  Placement  of Antibiotic Spacer;  Surgeon: Newt Minion, MD;  Location: [facility];  Service: Orthopedics;  Laterality: Right;   TONSILLECTOMY     TOTAL HIP ARTHROPLASTY Right 2011   TOTAL HIP REVISION Right 11/09/2014   Procedure: TOTAL HIP REVISION;  Surgeon: Newt Minion, MD;  Location: [facility];  Service: Orthopedics;  Laterality: Right;   Social History:  reports that she has never smoked. She has never used smokeless tobacco. She reports that she does not drink alcohol and does not use  drugs.  Allergies  Allergen Reactions   Metformin Other (See Comments)    REACTION: faintness, DIZZINESS   Penicillins Anaphylaxis   Seldane [Terfenadine] Other (See Comments)    N/V, increased heart rate, seizures, hallinations.   Sulfonamide Derivatives Anaphylaxis   Vicks Formula 44 Cough Relief [Dextromethorphan Hbr] Other (See Comments)    Runs blood pressure up    Xanax [Alprazolam] Other (See Comments)    Pt does not want Xanax--history of addiction.   Iohexol Other (See Comments)     Desc: pt states she [place] and immediately experienced profuse vomiting and sweating.  She said they treated her like she was having a reaction and doesn't remember much else or if meds were given., Onset Date: [id number]    Latex Other (See Comments)    BLISTERING   Other Nausea And Vomiting    "Anything I have ever been given more nausea just increases it."   Percocet [Oxycodone-Acetaminophen] Other (See Comments)    Hallucinations    History reviewed. No pertinent family history.  Prior to Admission medications   Medication Sig Start Date End Date Taking? Authorizing Provider  ACCU-CHEK AVIVA PLUS test strip 1 each by Other route daily. 05/02/19   [provider]  acetaminophen (TYLENOL) 325 MG tablet Take 650 mg by mouth every 6 (six) hours as needed for mild pain.    [provider]  aspirin 81 MG chewable tablet Chew 81 mg by mouth every evening.     [provider]  atorvastatin (LIPITOR) 20 MG tablet Take 20 mg by mouth every evening.  03/11/19   [provider]  b complex vitamins capsule Take 1 capsule by mouth daily.    [provider]  calcium elemental as carbonate (BARIATRIC TUMS ULTRA) 400 MG chewable tablet Chew by mouth.    [provider]  Cholecalciferol 25 MCG (1000 UT) tablet Take by mouth.    [provider]  ferrous sulfate 325 (65 FE) MG tablet Take 1 tablet (325 mg total) by mouth daily with  breakfast. 05/27/19   Shelly Coss, MD  glipiZIDE (GLUCOTROL) 5 MG tablet Take by mouth. 05/16/20 05/16/21  [provider]  insulin aspart (NOVOLOG FLEXPEN) 100 UNIT/ML FlexPen Inject 15 Units as directed 3 (three) times daily with meals. Per sliding scale and not to exceed 81 units daily.  04/05/19   [provider]  Insulin Detemir (LEVEMIR FLEXTOUCH) 100 UNIT/ML Pen Inject 40 Units into the skin at bedtime. 12/09/16   Sherwood Gambler, MD  Insulin Pen Needle (FIFTY50 PEN NEEDLES) 31G X 8 MM MISC 1 each by Other route daily. 02/15/18   [provider]  ketoconazole (NIZORAL) 2 % shampoo USE TWICE A WEEK TO SCALP X 4 WEEKS, THEN AS NEEDED 06/22/19   [provider]  lidocaine (  LIDODERM) 5 % Place 1 patch onto the skin daily. Remove & Discard patch within 12 hours or as directed by MD 05/18/19   Gareth Morgan, MD  lisinopril (PRINIVIL,ZESTRIL) 2.5 MG tablet Take 2.5 mg by mouth every evening.  06/24/17   [provider]  ondansetron (ZOFRAN) 4 MG tablet Take 4 mg by mouth every 8 (eight) hours as needed for nausea or vomiting.    [provider]  predniSONE (DELTASONE) 20 MG tablet Take 1 tablet (20 mg total) by mouth in the morning, at noon, and at bedtime. 01/26/20   Daleen Bo, MD  traMADol (ULTRAM) 50 MG tablet Take 1 tablet (50 mg total) by mouth every 6 (six) hours as needed. Patient taking differently: Take 50 mg by mouth every 6 (six) hours as needed for moderate pain. 05/26/19   Shelly Coss, MD  valACYclovir (VALTREX) 1000 MG tablet TAKE 1 TABLET (1,000 MG TOTAL) BY MOUTH 3 (THREE) TIMES DAILY FOR 14 DAYS. 01/26/20   [provider]    Physical Exam: Vitals:   08/22/22 0530 08/22/22 0545 08/22/22 0620 08/22/22 0645  BP: (!) 134/57 (!) 115/59  129/70  Pulse: 100 99  97  Resp:  16  19  Temp:   97.9 F (36.6 C)   TempSrc:   Oral   SpO2: 96% 97%  95%  Weight:      Height:       Exam  Constitutional: Elderly  obese female who appears to be in no acute distress Eyes: PERRL, lids and conjunctivae normal ENMT: Mucous membranes are moist.  Poor dentition with multiple dental caries Neck: normal, supple,  Respiratory: clear to auscultation bilaterally, no wheezing, no crackles. Normal respiratory effort. No accessory muscle use.  Cardiovascular: Regular rate and rhythm, no murmurs / rubs / gallops.  Abdomen: no tenderness, no masses palpated.  Bowel sounds positive.  Musculoskeletal: no clubbing / cyanosis. No joint deformity upper and lower extremities. Good ROM, no contractures. Normal muscle tone.  Skin: no rashes, lesions, ulcers. No induration Neurologic: CN 2-12 grossly intact.  Seems able to move all extremities. Psychiatric: Alert and oriented x person, place, and situation. Normal mood.   Data Reviewed:  EKG reveals sinus tachycardia 108 bpm nonspecific ST wave changes.  Reviewed labs, imaging and pertinent records as noted above in the HPI  Assessment and Plan: Fall Patient presents after having a unwitnessed fall at home.  Unclear what caused the fall, but patient was unable to get back up thereafter.  CT scan of the head and cervical spine did not note any acute abnormality.  Strength appears equal in both upper and lower extremities at this time.  Records note prior to hospitalization back in 05/2019 after patient had had a fall suffering left humerus fracture and subsequently hospitalization related with confusion.  Patient also denies any history of seizure although reported prior history of seizure related to medication allergy list.  -Admit to a telemetry bed -Neurochecks -Check UDS -Check MRI of the brain -Check echocardiogram -PT/OT to eval and treat due to fall with unclear cause -Follow-up telemetry -Transitions of care consulted.  Leukocytosis Acute.  WBC elevated at 14.5.  Chest x-ray did not note any acute abnormality and no acute fractures or.  She had on the ankle unclear  cause of symptoms at this time. -Follow-up urinalysis -Recheck CBC tomorrow morning  Uncontrolled Diabetes mellitus type 2, with long-term use of insulin On admission glucose elevated up to 337.  Home medication regimen appears to include  NovoLog 16 units 3 times daily with meals and Levemir 60 units nightly. -Hypoglycemic protocols -Reduced home Levemir to 40 units nightly while in a more controlled environment -CBGs before every meal with resistant SSI  Essential hypertension Blood pressure currently well-controlled.  Medication regimen previously had included lisinopril, but patient currently not taking this. -Continue to monitor  Obesity BMI 36.5 8 kg/m  DVT prophylaxis: Lovenox Advance Care Planning:   Code Status: Full Code    Consults: None   Severity of Illness: The appropriate patient status for this patient is OBSERVATION. Observation status is judged to be reasonable and necessary in order to provide the required intensity of service to ensure the patient's safety. The patient's presenting symptoms, physical exam findings, and initial radiographic and laboratory data in the context of their medical condition is felt to place them at decreased risk for further clinical deterioration. Furthermore, it is anticipated that the patient will be medically stable for discharge from the hospital within 2 midnights of admission.   Author: Norval Morton, MD 08/22/2022 7:25 AM  For on call review [email or web address].

## 2022-08-22 NOTE — Evaluation (Signed)
Physical Therapy Evaluation Patient Details Name: Kelly Richardson MRN: [id number] DOB: 1951/03/26 Today's Date: 08/22/2022  History of Present Illness  72 y.o. female admitted 2/17 presenting with episode of unwitnessed fall versus syncope. Patient with a history of diabetes, hypertension, seizures presenting with episode of unwitnessed fall versus syncope.  Clinical Impression  Pt admitted with above diagnosis. Per pt report she was independent without assistive device, 1 fall about 2 months ago in similar circumstances. Pt unable to recall year, eventually guesses 2017 otherwise oriented (noted some memory issues on prior admission in 2020 from therapy notes). Pt supervision to min guard to mobilize from bed to recliner. HR elevated into 130s while standing, becomes nauseous limiting ambulation today. Feels weaker than baseline. Lives with boyfriend who works. Will need to progress to mod I level to safely return home, anticipate she will progress quickly as medical issues resolve. Noted concern for unsafe home environment - patient states a Education officer, museum visited her house recently and suggested patient not get into her tub due to some kind of concern for danger to patient?  Will continue to progress during admission. Pt currently with functional limitations due to the deficits listed below (see PT Problem List). Pt will benefit from skilled PT to increase their independence and safety with mobility to allow discharge to the venue listed below.        08/22/22 1300  Orthostatic Lying   BP- Lying 105/77  Pulse- Lying 108  Orthostatic Sitting  BP- Sitting 132/75  Pulse- Sitting 123  Orthostatic Standing at 0 minutes  BP- Standing at 0 minutes (!) 128/108  Pulse- Standing at 0 minutes 135        Recommendations for follow up therapy are one component of a multi-disciplinary discharge planning process, led by the attending physician.  Recommendations may be updated based on patient status,  additional functional criteria and insurance authorization.  Follow Up Recommendations Home health PT (*Anticipate she will progress quickly however noted concerns over home environment. If patient fails to progress to a mod I level may consider SNF for short term rehab.)      Assistance Recommended at Discharge Intermittent Supervision/Assistance  Patient can return home with the following  A little help with walking and/or transfers;A little help with bathing/dressing/bathroom;Assistance with cooking/housework;Assist for transportation;Help with stairs or ramp for entrance    Equipment Recommendations Rolling walker (2 wheels)  Recommendations for Other Services       Functional Status Assessment Patient has had a recent decline in their functional status and demonstrates the ability to make significant improvements in function in a reasonable and predictable amount of time.     Precautions / Restrictions Precautions Precautions: Fall Restrictions Weight Bearing Restrictions: No      Mobility  Bed Mobility Overal bed mobility: Needs Assistance Bed Mobility: Supine to Sit, Sit to Supine     Supine to sit: Supervision Sit to supine: Supervision   General bed mobility comments: Supervision for safety. Slow, effortful, performed without physical assist x2.    Transfers Overall transfer level: Needs assistance Equipment used: Rolling walker (2 wheels) Transfers: Sit to/from Stand, Bed to chair/wheelchair/BSC Sit to Stand: Min guard   Step pivot transfers: Min guard       General transfer comment: Min guard for safety. Cues for hand placement. Slow and effortful to rise. performed from bed x2. Stable with steps however reliant on RW for support.    Ambulation/Gait  General Gait Details: deferred due to nausea  Stairs            Wheelchair Mobility    Modified Rankin (Stroke Patients Only)       Balance Overall balance assessment:  Needs assistance Sitting-balance support: No upper extremity supported, Feet supported Sitting balance-Leahy Scale: Good     Standing balance support: Single extremity supported Standing balance-Leahy Scale: Poor                               Pertinent Vitals/Pain Pain Assessment Pain Assessment: No/denies pain    Home Living Family/patient expects to be discharged to:: Private residence Living Arrangements: Spouse/significant other Available Help at Discharge: Family;Other (Comment) (Boyfriend - stays with pt but works) Type of Home: House Home Access: Stairs to enter Entrance Stairs-Rails: Chemical engineer of Steps: 4   Home Layout: One level [place]: None      Prior Function Prior Level of Function : Independent/Modified Independent;History of Falls (last six months)             Mobility Comments: Reports independent, one fall a couple of months ago, does not recall why she fell.       Hand Dominance   Dominant Hand: Right    Extremity/Trunk Assessment   Upper Extremity Assessment Upper Extremity Assessment: Defer to OT evaluation    Lower Extremity Assessment Lower Extremity Assessment: Generalized weakness       Communication   Communication: No difficulties  Cognition Arousal/Alertness: Awake/alert Behavior During Therapy: WFL for tasks assessed/performed Overall Cognitive Status: Impaired/Different from baseline Area of Impairment: Orientation                 Orientation Level: Disoriented to, Time             General Comments: Unsure of date, eventually guessed 2017. Recalls that Valentines day just occurred and was able to deduce it was February.        General Comments General comments (skin integrity, edema, etc.): See orthostatics in vitals tab and notes. Pt nauseous sitting, feels that heart is racing.    Exercises     Assessment/Plan    PT Assessment Patient needs continued PT  services  PT Problem List Decreased strength;Decreased activity tolerance;Decreased balance;Decreased mobility;Decreased cognition;Decreased knowledge of use of DME;Cardiopulmonary status limiting activity;Obesity       PT Treatment Interventions DME instruction;Gait training;Functional mobility training;Therapeutic activities;Therapeutic exercise;Stair training;Balance training;Neuromuscular re-education;Patient/family education    PT Goals (Current goals can be found in the Care Plan section)  Acute Rehab PT Goals Patient Stated Goal: Go home PT Goal Formulation: With patient Time For Goal Achievement: 09/05/22 Potential to Achieve Goals: Good    Frequency Min 3X/week     Co-evaluation               AM-PAC PT "6 Clicks" Mobility  Outcome Measure Help needed turning from your back to your side while in a flat bed without using bedrails?: None Help needed moving from lying on your back to sitting on the side of a flat bed without using bedrails?: A Little Help needed moving to and from a bed to a chair (including a wheelchair)?: A Little Help needed standing up from a chair using your arms (e.g., wheelchair or bedside chair)?: A Little Help needed to walk in hospital room?: A Little Help needed climbing 3-5 steps with a railing? : A Little 6 Click Score: 19  End of Session Equipment Utilized During Treatment: Gait belt Activity Tolerance: Patient limited by fatigue Patient left: in chair;with call bell/phone within reach;with chair alarm set Nurse Communication: Mobility status PT Visit Diagnosis: Muscle weakness (generalized) (M62.81);History of falling (Z91.81);Difficulty in walking, not elsewhere classified (R26.2)    Time: [id number] PT Time Calculation (min) (ACUTE ONLY): 43 min   Charges:   PT Evaluation $PT Eval Low Complexity: 1 Low PT Treatments $Therapeutic Activity: 23-37 mins        Candie Mile, PT, DPT Physical Therapist Acute Rehabilitation  Services [facility]   Ellouise Newer 08/22/2022, 2:08 PM

## 2022-08-22 NOTE — ED Triage Notes (Addendum)
PER EMS: pt from home, had an unwitnessed fall tonight when she was walking, unsure mechanism of fall. She hit the right side of her head against unknown object. No blood thinners. She states she was on the floor about 15 mins. No LOC. She reports neck and back pain right after her fall but now reports she is no longer having neck / back pain but is reporting pain to her right foot.  A&Ox3, usually is A&OX4.   BP- 140/110, HR-110, 94% RA, CBG-400  Pt arrives in c-collar.  [facility]

## 2022-08-22 NOTE — Progress Notes (Signed)
Pt A&OX4, BP 109/72 (BP Location: Left Arm)   Pulse (!) 102   Temp 98.2 F (36.8 C) (Oral)   Resp 18   Ht 5' 2"$  (1.575 m)   Wt 90.7 kg   SpO2 98%   BMI 36.58 kg/m  Blood sugar of 180, 4 units of insulin given. Pt placed on tele with purewick. Bed in lowest position, 2/4 rails up, bed alarm locked all 4 wheels locked. No skin issues. No needs stated at this time. Louanne Skye 08/22/22 12:54 PM

## 2022-08-22 NOTE — ED Notes (Addendum)
Called Financial controller to inform that pt is headed upstairs at this time

## 2022-08-22 NOTE — ED Notes (Signed)
Pt transported to CT ?

## 2022-08-22 NOTE — ED Notes (Signed)
ED TO INPATIENT HANDOFF REPORT  ED Nurse Name and Phone #: Heath Lark [phone]  S Name/Age/Gender Kelly Richardson 72 y.o. female Room/Bed: 006C/006C  Code Status   Code Status: Full Code  Home/SNF/Other Home Patient oriented to: self, place, time, and situation Is this baseline? Yes   Triage Complete: Triage complete  Chief Complaint Loss of consciousness ([place]) [R40.20]  Triage Note PER EMS: pt from home, had an unwitnessed fall tonight when she was walking, unsure mechanism of fall. She hit the right side of her head against unknown object. No blood thinners. She states she was on the floor about 15 mins. No LOC. She reports neck and back pain right after her fall but now reports she is no longer having neck / back pain but is reporting pain to her right foot.  A&Ox3, usually is A&OX4.   BP- 140/110, HR-110, 94% RA, CBG-400  Pt arrives in c-collar.  20gLAC   Allergies Allergies  Allergen Reactions   Metformin Other (See Comments)    REACTION: faintness, DIZZINESS   Penicillins Anaphylaxis   Seldane [Terfenadine] Other (See Comments)    N/V, increased heart rate, seizures, hallinations.   Sulfonamide Derivatives Anaphylaxis   Vicks Formula 44 Cough Relief [Dextromethorphan Hbr] Other (See Comments)    Runs blood pressure up    Xanax [Alprazolam] Other (See Comments)    Pt does not want Xanax--history of addiction.   Iohexol Other (See Comments)     Desc: pt states she [place] ct [place] 2000 and immediately experienced profuse vomiting and sweating.  She said they treated her like she was having a reaction and doesn't remember much else or if meds were given., Onset Date: [id number]    Latex Other (See Comments)    BLISTERING   Other Nausea And Vomiting    "Anything I have ever been given more nausea just increases it."   Percocet [Oxycodone-Acetaminophen] Other (See Comments)    Hallucinations    Level of Care/Admitting Diagnosis ED Disposition      ED Disposition  Admit   Condition  --   [place]: [place] [[id number]]  Level of Care: Telemetry Medical [104]  May place patient in observation at [facility] or [place] if equivalent level of care is available:: No  Covid Evaluation: Asymptomatic - no recent exposure (last 10 days) testing not required  Diagnosis: Loss of consciousness [facility]) [id number]  Admitting Physician: Norval Morton [id number]  Attending Physician: Norval Morton [id number]          B Medical/Surgery History Past Medical History:  Diagnosis Date   Arthritis    Complication of anesthesia    Diabetes mellitus without complication ([place])    Heart murmur    as a child   Hypertension    PONV (postoperative nausea and vomiting)    Seizures (HCC)    Shortness of breath dyspnea    sometimes  at night   Past Surgical History:  Procedure Laterality Date   ABDOMINAL HYSTERECTOMY     CHOLECYSTECTOMY  1995   COLONOSCOPY     EXCISIONAL TOTAL HIP ARTHROPLASTY WITH ANTIBIOTIC SPACERS Right 09/07/2014   Procedure: Excision Total Hip Arthroplasty, Place Antibiotic Spacer;  Surgeon: Newt Minion, MD;  Location: [facility];  Service: Orthopedics;  Laterality: Right;   INCISION AND DRAINAGE HIP Right 06/08/2014   Procedure: Irrigation and Debridement Right Hip, Revision of Cup and Head of Total Hip with  Placement  of  Antibiotic Spacer;  Surgeon: Newt Minion, MD;  Location: [place];  Service: Orthopedics;  Laterality: Right;   TONSILLECTOMY     TOTAL HIP ARTHROPLASTY Right 2011   TOTAL HIP REVISION Right 11/09/2014   Procedure: TOTAL HIP REVISION;  Surgeon: Newt Minion, MD;  Location: [place];  Service: Orthopedics;  Laterality: Right;     A IV Location/Drains/Wounds Patient Lines/Drains/Airways Status     Active Line/Drains/Airways     Name Placement date Placement time Site Days   Peripheral IV 08/22/22 20 G Anterior;Left;Proximal Forearm 08/22/22  --  Forearm  less than 1             Intake/Output Last 24 hours No intake or output data in the 24 hours ending 08/22/22 [id number]  Labs/Imaging Results for orders placed or performed during the hospital encounter of 08/22/22 (from the past 48 hour(s))  CBG monitoring, ED     Status: Abnormal   Collection Time: 08/22/22  2:46 AM  Result Value Ref Range   Glucose-Capillary 326 (H) 70 - 99 mg/dL    Comment: Glucose reference range applies only to samples taken after fasting for at least 8 hours.  CBC with Differential     Status: Abnormal   Collection Time: 08/22/22  3:21 AM  Result Value Ref Range   WBC 14.2 (H) 4.0 - 10.5 K/uL   RBC 5.59 (H) 3.87 - 5.11 MIL/uL   Hemoglobin 13.1 12.0 - 15.0 g/dL   HCT 43.9 36.0 - 46.0 %   MCV 78.5 (L) 80.0 - 100.0 fL   MCH 23.4 (L) 26.0 - 34.0 pg   MCHC 29.8 (L) 30.0 - 36.0 g/dL   RDW 18.2 (H) 11.5 - 15.5 %   Platelets 245 150 - 400 K/uL   nRBC 0.0 0.0 - 0.2 %   Neutrophils Relative % 78 %   Neutro Abs 11.2 (H) 1.7 - 7.7 K/uL   Lymphocytes Relative 11 %   Lymphs Abs 1.6 0.7 - 4.0 K/uL   Monocytes Relative 9 %   Monocytes Absolute 1.2 (H) 0.1 - 1.0 K/uL   Eosinophils Relative 1 %   Eosinophils Absolute 0.1 0.0 - 0.5 K/uL   Basophils Relative 0 %   Basophils Absolute 0.0 0.0 - 0.1 K/uL   Immature Granulocytes 1 %   Abs Immature Granulocytes 0.10 (H) 0.00 - 0.07 K/uL    Comment: Performed at [facility], [address], [place], [place] [address]  Comprehensive metabolic panel     Status: Abnormal   Collection Time: 08/22/22  3:21 AM  Result Value Ref Range   Sodium 134 (L) 135 - 145 mmol/L   Potassium 4.4 3.5 - 5.1 mmol/L    Comment: HEMOLYSIS AT THIS LEVEL MAY AFFECT RESULT   Chloride 99 98 - 111 mmol/L   CO2 21 (L) 22 - 32 mmol/L   Glucose, Bld 337 (H) 70 - 99 mg/dL    Comment: Glucose reference range applies only to samples taken after fasting for at least 8 hours.   BUN 13 8 - 23 mg/dL   Creatinine, Ser 1.00 0.44 - 1.00 mg/dL   Calcium 9.0 8.9 - 10.3 mg/dL    Total Protein 7.4 6.5 - 8.1 g/dL   Albumin 3.5 3.5 - 5.0 g/dL   AST 39 15 - 41 U/L    Comment: HEMOLYSIS AT THIS LEVEL MAY AFFECT RESULT   ALT 30 0 - 44 U/L    Comment: HEMOLYSIS AT THIS LEVEL MAY AFFECT  RESULT   Alkaline Phosphatase 84 38 - 126 U/L   Total Bilirubin 1.0 0.3 - 1.2 mg/dL    Comment: HEMOLYSIS AT THIS LEVEL MAY AFFECT RESULT   GFR, Estimated >60 >60 mL/min    Comment: (NOTE) Calculated using the CKD-EPI Creatinine Equation (2021)    Anion gap 14 5 - 15    Comment: Performed at [facility] [address], [place], [place] [address]  Beta-hydroxybutyric acid     Status: None   Collection Time: 08/22/22  3:21 AM  Result Value Ref Range   Beta-Hydroxybutyric Acid 0.22 0.05 - 0.27 mmol/L    Comment: Performed at [facility] [address], [place], [place] [address]  Hemoglobin A1c     Status: Abnormal   Collection Time: 08/22/22  3:21 AM  Result Value Ref Range   Hgb A1c MFr Bld 9.8 (H) 4.8 - 5.6 %    Comment: (NOTE) Pre diabetes:          5.7%-6.4%  Diabetes:              >6.4%  Glycemic control for   <7.0% adults with diabetes    Mean Plasma Glucose 234.56 mg/dL    Comment: Performed at [facility] [address], [place], [place] [address]  CK     Status: None   Collection Time: 08/22/22  3:21 AM  Result Value Ref Range   Total CK 76 38 - 234 U/L    Comment: Performed at [facility], [place] [address], [place], [place] [address]  I-Stat venous blood gas, [facility], [facility], [facility])     Status: Abnormal   Collection Time: 08/22/22  3:58 AM  Result Value Ref Range   pH, Ven 7.384 7.25 - 7.43   pCO2, Ven 42.5 (L) 44 - 60 mmHg   pO2, Ven 85 (H) 32 - 45 mmHg   Bicarbonate 25.4 20.0 - 28.0 mmol/L   TCO2 27 22 - 32 mmol/L   O2 Saturation 96 %   Acid-Base Excess 0.0 0.0 - 2.0 mmol/L   Sodium 136 135 - 145 mmol/L   Potassium 4.3 3.5 - 5.1 mmol/L   Calcium, Ion 1.06 (L) 1.15 - 1.40 mmol/L   HCT 43.0 36.0 - 46.0 %   Hemoglobin 14.6 12.0 -  15.0 g/dL   Sample type VENOUS   CBG monitoring, ED     Status: Abnormal   Collection Time: 08/22/22  5:33 AM  Result Value Ref Range   Glucose-Capillary 338 (H) 70 - 99 mg/dL    Comment: Glucose reference range applies only to samples taken after fasting for at least 8 hours.  Troponin I (High Sensitivity)     Status: None   Collection Time: 08/22/22  7:05 AM  Result Value Ref Range   Troponin I (High Sensitivity) 7 <18 ng/L    Comment: (NOTE) Elevated high sensitivity troponin I (hsTnI) values and significant  changes across serial measurements may suggest ACS but many other  chronic and acute conditions are known to elevate hsTnI results.  Refer to the "Links" section for chest pain algorithms and additional  guidance. Performed at [facility], [place] [address], [place], [place] [address]   CBG monitoring, ED     Status: Abnormal   Collection Time: 08/22/22  8:24 AM  Result Value Ref Range   Glucose-Capillary 266 (H) 70 - 99 mg/dL    Comment: Glucose reference range applies only to samples taken  after fasting for at least 8 hours.   CT HEAD WO CONTRAST (5MM)  Result Date: 08/22/2022 CLINICAL DATA:  Facial trauma, unwitnessed fall. EXAM: CT HEAD WITHOUT CONTRAST CT CERVICAL SPINE WITHOUT CONTRAST TECHNIQUE: Multidetector CT imaging of the head and cervical spine was performed following the standard protocol without intravenous contrast. Multiplanar CT image reconstructions of the cervical spine were also generated. RADIATION DOSE REDUCTION: This exam was performed according to the departmental dose-optimization program which includes automated exposure control, adjustment of the mA and/or kV according to patient size and/or use of iterative reconstruction technique. COMPARISON:  None Available. FINDINGS: CT HEAD FINDINGS Brain: No evidence of acute infarction, hemorrhage, hydrocephalus, extra-axial collection or mass lesion/mass effect. Vascular: No hyperdense vessel or unexpected  calcification. Skull: Normal. Negative for fracture or focal lesion. Sinuses/Orbits: No evidence of injury CT CERVICAL SPINE FINDINGS Alignment: Normal. Skull base and vertebrae: No acute fracture. No primary bone lesion or focal pathologic process. Soft tissues and spinal canal: No prevertebral fluid or swelling. No visible canal hematoma. Disc levels:  Ordinary and generalized cervical spine degeneration Upper chest: No evidence of injury IMPRESSION: No evidence of intracranial or cervical spine injury. Electronically Signed   By: Jorje Guild M.D.   On: 08/22/2022 05:03   CT Cervical Spine Wo Contrast  Result Date: 08/22/2022 CLINICAL DATA:  Facial trauma, unwitnessed fall. EXAM: CT HEAD WITHOUT CONTRAST CT CERVICAL SPINE WITHOUT CONTRAST TECHNIQUE: Multidetector CT imaging of the head and cervical spine was performed following the standard protocol without intravenous contrast. Multiplanar CT image reconstructions of the cervical spine were also generated. RADIATION DOSE REDUCTION: This exam was performed according to the departmental dose-optimization program which includes automated exposure control, adjustment of the mA and/or kV according to patient size and/or use of iterative reconstruction technique. COMPARISON:  None Available. FINDINGS: CT HEAD FINDINGS Brain: No evidence of acute infarction, hemorrhage, hydrocephalus, extra-axial collection or mass lesion/mass effect. Vascular: No hyperdense vessel or unexpected calcification. Skull: Normal. Negative for fracture or focal lesion. Sinuses/Orbits: No evidence of injury CT CERVICAL SPINE FINDINGS Alignment: Normal. Skull base and vertebrae: No acute fracture. No primary bone lesion or focal pathologic process. Soft tissues and spinal canal: No prevertebral fluid or swelling. No visible canal hematoma. Disc levels:  Ordinary and generalized cervical spine degeneration Upper chest: No evidence of injury IMPRESSION: No evidence of intracranial or  cervical spine injury. Electronically Signed   By: Jorje Guild M.D.   On: 08/22/2022 05:03   DG Chest 1 View  Result Date: 08/22/2022 CLINICAL DATA:  fall EXAM: CHEST  1 VIEW COMPARISON:  Chest x-ray 05/29/2019 FINDINGS: The heart and mediastinal contours are within normal limits. No focal consolidation. No pulmonary edema. No pleural effusion. No pneumothorax. No acute osseous abnormality. IMPRESSION: No active disease. Electronically Signed   By: Iven Finn M.D.   On: 08/22/2022 03:06   DG Ankle Complete Right  Result Date: 08/22/2022 CLINICAL DATA:  Fall, ankle pain. EXAM: RIGHT ANKLE - COMPLETE 3+ VIEW COMPARISON:  None Available. FINDINGS: There is no evidence of fracture, dislocation, or joint effusion. Mild degenerative changes are noted at the medial malleolus. Soft tissue swelling is present about the ankle. IMPRESSION: No acute fracture or dislocation. Electronically Signed   By: Brett Fairy M.D.   On: 08/22/2022 03:04    Pending Labs Unresulted Labs (From admission, onward)     Start     Ordered   08/23/22 0500  CBC  Tomorrow morning,   R  08/22/22 0754   08/23/22 [id number]  Basic metabolic panel  Tomorrow morning,   R        08/22/22 0754   08/22/22 0753  HIV Antibody (routine testing w rflx)  (HIV Antibody (Routine testing w reflex) panel)  Once,   R        08/22/22 0754   08/22/22 0246  Urinalysis, Routine w reflex microscopic -Urine, Clean Catch  Once,   URGENT       Question:  Specimen Source  Answer:  Urine, Clean Catch   08/22/22 0249   08/22/22 0246  Urine Culture (for pregnant, neutropenic or urologic patients or patients with an indwelling urinary catheter)  (Urine Labs)  Once,   URGENT       Question:  Indication  Answer:  Altered mental status (if no other cause identified)   08/22/22 0249            Vitals/Pain Today's Vitals   08/22/22 0545 08/22/22 0620 08/22/22 0645 08/22/22 0730  BP: (!) 115/59  129/70 115/70  Pulse: 99  97 98  Resp: 16   19 16  $ Temp:  97.9 F (36.6 C)    TempSrc:  Oral    SpO2: 97%  95% 96%  Weight:      Height:      PainSc:        Isolation Precautions No active isolations  Medications Medications  enoxaparin (LOVENOX) injection 40 mg (40 mg Subcutaneous Given 08/22/22 0833)  sodium chloride flush (NS) 0.9 % injection 3 mL (has no administration in time range)  acetaminophen (TYLENOL) tablet 650 mg (has no administration in time range)    Or  acetaminophen (TYLENOL) suppository 650 mg (has no administration in time range)  ondansetron (ZOFRAN) tablet 4 mg (has no administration in time range)    Or  ondansetron (ZOFRAN) injection 4 mg (has no administration in time range)  albuterol (PROVENTIL) (2.5 MG/3ML) 0.083% nebulizer solution 2.5 mg (has no administration in time range)  hydrALAZINE (APRESOLINE) injection 10 mg (has no administration in time range)  0.9 %  sodium chloride infusion (0 mLs Intravenous Hold 08/22/22 0833)  insulin aspart (novoLOG) injection 0-20 Units (11 Units Subcutaneous Given 08/22/22 0833)  insulin aspart (novoLOG) injection 0-5 Units (has no administration in time range)  lactated ringers bolus 1,000 mL (1,000 mLs Intravenous New Bag/Given 08/22/22 0542)    Mobility walks     Focused Assessments Neuro Assessment Handoff:  Swallow screen pass? Yes          Neuro Assessment:   Neuro Checks:      Has TPA been given? No If patient is a Neuro Trauma and patient is going to OR before floor call report to [place] nurse: [phone] or [phone]   R Recommendations: See Admitting Provider Note  Report given to:   Additional Notes:

## 2022-08-22 NOTE — ED Notes (Signed)
Purewick placed on the pt

## 2022-08-22 NOTE — ED Provider Notes (Addendum)
[facility] Provider Note   CSN: [id number] Arrival date & time: 08/22/22  0242     History  Chief Complaint  Patient presents with   Kelly Richardson is a 72 y.o. female.  Patient with a history of diabetes, hypertension, seizures presenting with episode of unwitnessed fall versus syncope.  States she felt well prior to tonight.  She remembers trying to let in her cat and the next thing she knows she was on the ground.  States she "just felt like I was going to fall".  Believes she hit her head on the door frame.  Does not think she lost consciousness but is not sure.  Believes she was on the ground for 10 to 15 minutes.  Denies any preceding dizziness or lightheadedness.  Does not think she tripped or stumbled.  Initially had head and neck and left ankle pain but denies any pain currently.  No chest pain or shortness of breath.  No abdominal pain or vomiting.  No recent illnesses.  Reports history of diabetes with compliance with her medications.  No blood thinner use. No tongue biting or incontinence.  EMS reports concerns over cleanliness of the house as there was urine and feces throughout the home and patient was found to be covered in urine and feces herself. States she lives alone but has a boyfriend visit frequently.   Fall Associated symptoms include headaches. Pertinent negatives include no chest pain, no abdominal pain and no shortness of breath.       Home Medications Prior to Admission medications   Medication Sig Start Date End Date Taking? Authorizing Provider  ACCU-CHEK AVIVA PLUS test strip 1 each by Other route daily. 05/02/19   [provider]  acetaminophen (TYLENOL) 325 MG tablet Take 650 mg by mouth every 6 (six) hours as needed for mild pain.    [provider]  aspirin 81 MG chewable tablet Chew 81 mg by mouth every evening.     [provider]  atorvastatin (LIPITOR) 20 MG  tablet Take 20 mg by mouth every evening.  03/11/19   [provider]  b complex vitamins capsule Take 1 capsule by mouth daily.    [provider]  calcium elemental as carbonate (BARIATRIC TUMS ULTRA) 400 MG chewable tablet Chew by mouth.    [provider]  Cholecalciferol 25 MCG (1000 UT) tablet Take by mouth.    [provider]  ferrous sulfate 325 (65 FE) MG tablet Take 1 tablet (325 mg total) by mouth daily with breakfast. 05/27/19   Shelly Coss, MD  glipiZIDE (GLUCOTROL) 5 MG tablet Take by mouth. 05/16/20 05/16/21  [provider]  insulin aspart (NOVOLOG FLEXPEN) 100 UNIT/ML FlexPen Inject 15 Units as directed 3 (three) times daily with meals. Per sliding scale and not to exceed 81 units daily.  04/05/19   [provider]  Insulin Detemir (LEVEMIR FLEXTOUCH) 100 UNIT/ML Pen Inject 40 Units into the skin at bedtime. 12/09/16   Sherwood Gambler, MD  Insulin Pen Needle (FIFTY50 PEN NEEDLES) 31G X 8 MM MISC 1 each by Other route daily. 02/15/18   [provider]  ketoconazole (NIZORAL) 2 % shampoo USE TWICE A WEEK TO SCALP X 4 WEEKS, THEN AS NEEDED 06/22/19   [provider]  lidocaine (LIDODERM) 5 % Place 1 patch onto the skin daily. Remove & Discard patch within 12 hours or as directed by MD 05/18/19  Gareth Morgan, MD  lisinopril (PRINIVIL,ZESTRIL) 2.5 MG tablet Take 2.5 mg by mouth every evening.  06/24/17   [provider]  ondansetron (ZOFRAN) 4 MG tablet Take 4 mg by mouth every 8 (eight) hours as needed for nausea or vomiting.    [provider]  predniSONE (DELTASONE) 20 MG tablet Take 1 tablet (20 mg total) by mouth in the morning, at noon, and at bedtime. 01/26/20   Daleen Bo, MD  traMADol (ULTRAM) 50 MG tablet Take 1 tablet (50 mg total) by mouth every 6 (six) hours as needed. Patient taking differently: Take 50 mg by mouth every 6 (six) hours as needed for moderate pain. 05/26/19    Shelly Coss, MD  valACYclovir (VALTREX) 1000 MG tablet TAKE 1 TABLET (1,000 MG TOTAL) BY MOUTH 3 (THREE) TIMES DAILY FOR 14 DAYS. 01/26/20   [provider]      Allergies    Metformin, Penicillins, Seldane [terfenadine], Sulfonamide derivatives, Vicks formula 44 cough relief [dextromethorphan hbr], Xanax [alprazolam], Iohexol, Latex, Other, and Percocet [oxycodone-acetaminophen]    Review of Systems   Review of Systems  Constitutional:  Negative for activity change, appetite change, fever and unexpected weight change.  HENT:  Negative for congestion and rhinorrhea.   Respiratory:  Negative for cough and shortness of breath.   Cardiovascular:  Negative for chest pain.  Gastrointestinal:  Negative for abdominal pain, nausea and vomiting.  Genitourinary:  Negative for dysuria and hematuria.  Musculoskeletal:  Positive for arthralgias and myalgias.  Neurological:  Positive for syncope and headaches. Negative for dizziness and weakness.   all other systems are negative except as noted in the HPI and PMH.    Physical Exam Updated Vital Signs BP (!) 124/57   Pulse (!) 101   Temp 98 F (36.7 C) (Oral)   Resp 18   Ht 5' 2"$  (1.575 m)   Wt 90.7 kg   SpO2 95%   BMI 36.58 kg/m  Physical Exam Vitals and nursing note reviewed.  Constitutional:      General: She is not in acute distress.    Appearance: She is well-developed. She is not ill-appearing.     Comments: Disheveled appearance  HENT:     Head: Normocephalic and atraumatic.     Mouth/Throat:     Mouth: Mucous membranes are dry.     Pharynx: No oropharyngeal exudate.  Eyes:     Conjunctiva/sclera: Conjunctivae normal.     Pupils: Pupils are equal, round, and reactive to light.  Neck:     Comments: No midline C-spine pain, c-collar cleared on arrival. Cardiovascular:     Rate and Rhythm: Regular rhythm. Tachycardia present.     Heart sounds: Normal heart sounds. No murmur heard. Pulmonary:     Effort: Pulmonary  effort is normal. No respiratory distress.     Breath sounds: Normal breath sounds.  Chest:     Chest wall: No tenderness.  Abdominal:     Palpations: Abdomen is soft.     Tenderness: There is no abdominal tenderness. There is left CVA tenderness. There is no guarding or rebound.  Musculoskeletal:        General: No tenderness. Normal range of motion.     Cervical back: Normal range of motion and neck supple.  Skin:    General: Skin is warm.     Comments: Caked feces to feet bilaterally  Full range of motion of bilateral hips, knees and ankles.  No acute bony tenderness.  Neurological:  Mental Status: She is alert and oriented to person, place, and time.     Cranial Nerves: No cranial nerve deficit.     Motor: No abnormal muscle tone.     Coordination: Coordination normal.     Comments:  5/5 strength throughout. CN 2-12 intact.Equal grip strength.   Psychiatric:        Behavior: Behavior normal.     ED Results / Procedures / Treatments   Labs (all labs ordered are listed, but only abnormal results are displayed) Labs Reviewed  CBC WITH DIFFERENTIAL/PLATELET - Abnormal; Notable for the following components:      Result Value   WBC 14.2 (*)    RBC 5.59 (*)    MCV 78.5 (*)    MCH 23.4 (*)    MCHC 29.8 (*)    RDW 18.2 (*)    Neutro Abs 11.2 (*)    Monocytes Absolute 1.2 (*)    Abs Immature Granulocytes 0.10 (*)    All other components within normal limits  COMPREHENSIVE METABOLIC PANEL - Abnormal; Notable for the following components:   Sodium 134 (*)    CO2 21 (*)    Glucose, Bld 337 (*)    All other components within normal limits  HEMOGLOBIN A1C - Abnormal; Notable for the following components:   Hgb A1c MFr Bld 9.8 (*)    All other components within normal limits  CBG MONITORING, ED - Abnormal; Notable for the following components:   Glucose-Capillary 326 (*)    All other components within normal limits  I-STAT VENOUS BLOOD GAS, ED - Abnormal; Notable for the  following components:   pCO2, Ven 42.5 (*)    pO2, Ven 85 (*)    Calcium, Ion 1.06 (*)    All other components within normal limits  CBG MONITORING, ED - Abnormal; Notable for the following components:   Glucose-Capillary 338 (*)    All other components within normal limits  URINE CULTURE  BETA-HYDROXYBUTYRIC ACID  CK  URINALYSIS, ROUTINE W REFLEX MICROSCOPIC  TROPONIN I (HIGH SENSITIVITY)    EKG EKG Interpretation  Date/Time:  Saturday August 22 2022 02:51:43 EST Ventricular Rate:  108 PR Interval:  166 QRS Duration: 62 QT Interval:  332 QTC Calculation: 444 R Axis:   43 Text Interpretation: Sinus tachycardia Low voltage QRS Borderline ECG When compared with ECG of 26-Jan-2020 10:15, PREVIOUS ECG IS PRESENT Nonspecific ST abnormality Confirmed by Ezequiel Essex [phone]) on 08/22/2022 5:04:07 AM  Radiology CT HEAD WO CONTRAST (5MM)  Result Date: 08/22/2022 CLINICAL DATA:  Facial trauma, unwitnessed fall. EXAM: CT HEAD WITHOUT CONTRAST CT CERVICAL SPINE WITHOUT CONTRAST TECHNIQUE: Multidetector CT imaging of the head and cervical spine was performed following the standard protocol without intravenous contrast. Multiplanar CT image reconstructions of the cervical spine were also generated. RADIATION DOSE REDUCTION: This exam was performed according to the departmental dose-optimization program which includes automated exposure control, adjustment of the mA and/or kV according to patient size and/or use of iterative reconstruction technique. COMPARISON:  None Available. FINDINGS: CT HEAD FINDINGS Brain: No evidence of acute infarction, hemorrhage, hydrocephalus, extra-axial collection or mass lesion/mass effect. Vascular: No hyperdense vessel or unexpected calcification. Skull: Normal. Negative for fracture or focal lesion. Sinuses/Orbits: No evidence of injury CT CERVICAL SPINE FINDINGS Alignment: Normal. Skull base and vertebrae: No acute fracture. No primary bone lesion or focal  pathologic process. Soft tissues and spinal canal: No prevertebral fluid or swelling. No visible canal hematoma. Disc levels:  Ordinary and generalized cervical spine  degeneration Upper chest: No evidence of injury IMPRESSION: No evidence of intracranial or cervical spine injury. Electronically Signed   By: Jorje Guild M.D.   On: 08/22/2022 05:03   CT Cervical Spine Wo Contrast  Result Date: 08/22/2022 CLINICAL DATA:  Facial trauma, unwitnessed fall. EXAM: CT HEAD WITHOUT CONTRAST CT CERVICAL SPINE WITHOUT CONTRAST TECHNIQUE: Multidetector CT imaging of the head and cervical spine was performed following the standard protocol without intravenous contrast. Multiplanar CT image reconstructions of the cervical spine were also generated. RADIATION DOSE REDUCTION: This exam was performed according to the departmental dose-optimization program which includes automated exposure control, adjustment of the mA and/or kV according to patient size and/or use of iterative reconstruction technique. COMPARISON:  None Available. FINDINGS: CT HEAD FINDINGS Brain: No evidence of acute infarction, hemorrhage, hydrocephalus, extra-axial collection or mass lesion/mass effect. Vascular: No hyperdense vessel or unexpected calcification. Skull: Normal. Negative for fracture or focal lesion. Sinuses/Orbits: No evidence of injury CT CERVICAL SPINE FINDINGS Alignment: Normal. Skull base and vertebrae: No acute fracture. No primary bone lesion or focal pathologic process. Soft tissues and spinal canal: No prevertebral fluid or swelling. No visible canal hematoma. Disc levels:  Ordinary and generalized cervical spine degeneration Upper chest: No evidence of injury IMPRESSION: No evidence of intracranial or cervical spine injury. Electronically Signed   By: Jorje Guild M.D.   On: 08/22/2022 05:03   DG Chest 1 View  Result Date: 08/22/2022 CLINICAL DATA:  fall EXAM: CHEST  1 VIEW COMPARISON:  Chest x-ray 05/29/2019 FINDINGS: The  heart and mediastinal contours are within normal limits. No focal consolidation. No pulmonary edema. No pleural effusion. No pneumothorax. No acute osseous abnormality. IMPRESSION: No active disease. Electronically Signed   By: Iven Finn M.D.   On: 08/22/2022 03:06   DG Ankle Complete Right  Result Date: 08/22/2022 CLINICAL DATA:  Fall, ankle pain. EXAM: RIGHT ANKLE - COMPLETE 3+ VIEW COMPARISON:  None Available. FINDINGS: There is no evidence of fracture, dislocation, or joint effusion. Mild degenerative changes are noted at the medial malleolus. Soft tissue swelling is present about the ankle. IMPRESSION: No acute fracture or dislocation. Electronically Signed   By: Brett Fairy M.D.   On: 08/22/2022 03:04    Procedures Procedures    Medications Ordered in ED Medications  lactated ringers bolus 1,000 mL (has no administration in time range)    ED Course/ Medical Decision Making/ A&P                             Medical Decision Making Amount and/or Complexity of Data Reviewed Independent Historian: EMS Labs: ordered. Decision-making details documented in ED Course. Radiology: ordered and independent interpretation performed. Decision-making details documented in ED Course. ECG/medicine tests: ordered and independent interpretation performed. Decision-making details documented in ED Course.  Risk Decision regarding hospitalization.   Fall versus syncope.  No trip or stumble  No prodrome.  Patient unsure what happened.  Denies any pain currently.  Neurological exam is nonfocal.  EKG without acute ischemia.  Does show borderline ST depression in V2 and V3.  No Brugada no prolonged QT  Patient given hydration.  Will check orthostatics.  She does appear to be dry. hyperglycemia without evidence of DKA  Concern for possible syncopal episode as patient did not have prodrome before falling. does not recall circumstances of going to her fall.  Orthostatics to be obtained. IVF  given.   Plan to admit for syncope workup.  Discussed with Dr. Tamala Julian.  Social work consult placed given patient's poor living conditions and disheveled state.       Final Clinical Impression(s) / ED Diagnoses Final diagnoses:  Syncope and collapse  Hyperglycemia    Rx / DC Orders ED Discharge Orders     None         Zailyn Thoennes, Annie Main, MD 08/22/22 [id number]    Ezequiel Essex, MD 08/22/22 1912

## 2022-08-22 NOTE — ED Notes (Signed)
MD at bedside. 

## 2022-08-23 ENCOUNTER — Observation Stay (HOSPITAL_COMMUNITY): Payer: Medicare Other

## 2022-08-23 DIAGNOSIS — Z794 Long term (current) use of insulin: Secondary | ICD-10-CM | POA: Diagnosis not present

## 2022-08-23 DIAGNOSIS — Z882 Allergy status to sulfonamides status: Secondary | ICD-10-CM | POA: Diagnosis not present

## 2022-08-23 DIAGNOSIS — Z91040 Latex allergy status: Secondary | ICD-10-CM | POA: Diagnosis not present

## 2022-08-23 DIAGNOSIS — Z888 Allergy status to other drugs, medicaments and biological substances status: Secondary | ICD-10-CM | POA: Diagnosis not present

## 2022-08-23 DIAGNOSIS — D72829 Elevated white blood cell count, unspecified: Secondary | ICD-10-CM | POA: Diagnosis present

## 2022-08-23 DIAGNOSIS — Y92009 Unspecified place in unspecified non-institutional (private) residence as the place of occurrence of the external cause: Secondary | ICD-10-CM

## 2022-08-23 DIAGNOSIS — R55 Syncope and collapse: Secondary | ICD-10-CM

## 2022-08-23 DIAGNOSIS — R300 Dysuria: Secondary | ICD-10-CM | POA: Diagnosis present

## 2022-08-23 DIAGNOSIS — Z6836 Body mass index (BMI) 36.0-36.9, adult: Secondary | ICD-10-CM | POA: Diagnosis not present

## 2022-08-23 DIAGNOSIS — Z96649 Presence of unspecified artificial hip joint: Secondary | ICD-10-CM | POA: Diagnosis present

## 2022-08-23 DIAGNOSIS — Z7984 Long term (current) use of oral hypoglycemic drugs: Secondary | ICD-10-CM | POA: Diagnosis not present

## 2022-08-23 DIAGNOSIS — W19XXXA Unspecified fall, initial encounter: Secondary | ICD-10-CM | POA: Diagnosis present

## 2022-08-23 DIAGNOSIS — E860 Dehydration: Secondary | ICD-10-CM | POA: Diagnosis present

## 2022-08-23 DIAGNOSIS — R8281 Pyuria: Secondary | ICD-10-CM | POA: Diagnosis present

## 2022-08-23 DIAGNOSIS — I10 Essential (primary) hypertension: Secondary | ICD-10-CM | POA: Diagnosis present

## 2022-08-23 DIAGNOSIS — Z9109 Other allergy status, other than to drugs and biological substances: Secondary | ICD-10-CM | POA: Diagnosis not present

## 2022-08-23 DIAGNOSIS — E1165 Type 2 diabetes mellitus with hyperglycemia: Principal | ICD-10-CM

## 2022-08-23 DIAGNOSIS — W2209XA Striking against other stationary object, initial encounter: Secondary | ICD-10-CM | POA: Diagnosis present

## 2022-08-23 DIAGNOSIS — E669 Obesity, unspecified: Secondary | ICD-10-CM | POA: Diagnosis present

## 2022-08-23 DIAGNOSIS — Z7952 Long term (current) use of systemic steroids: Secondary | ICD-10-CM | POA: Diagnosis not present

## 2022-08-23 DIAGNOSIS — Z7982 Long term (current) use of aspirin: Secondary | ICD-10-CM | POA: Diagnosis not present

## 2022-08-23 DIAGNOSIS — G40909 Epilepsy, unspecified, not intractable, without status epilepticus: Secondary | ICD-10-CM | POA: Diagnosis present

## 2022-08-23 DIAGNOSIS — Z886 Allergy status to analgesic agent status: Secondary | ICD-10-CM | POA: Diagnosis not present

## 2022-08-23 DIAGNOSIS — Z880 Allergy status to penicillin: Secondary | ICD-10-CM | POA: Diagnosis not present

## 2022-08-23 DIAGNOSIS — Z79899 Other long term (current) drug therapy: Secondary | ICD-10-CM | POA: Diagnosis not present

## 2022-08-23 LAB — URINALYSIS, ROUTINE W REFLEX MICROSCOPIC
Bilirubin Urine: NEGATIVE
Glucose, UA: >=500 mg/dL — AB
Hgb urine dipstick: NEGATIVE
Ketones, ur: NEGATIVE mg/dL
Nitrite: NEGATIVE
Protein, ur: NEGATIVE mg/dL
Specific Gravity, Urine: 1.015 (ref 1.005–1.030)
pH: 5.5 (ref 5.0–8.0)

## 2022-08-23 LAB — BASIC METABOLIC PANEL
Anion gap: 13 (ref 5–15)
BUN: 12 mg/dL (ref 8–23)
CO2: 22 mmol/L (ref 22–32)
Calcium: 8.6 mg/dL — ABNORMAL LOW (ref 8.9–10.3)
Chloride: 101 mmol/L (ref 98–111)
Creatinine, Ser: 0.86 mg/dL (ref 0.44–1.00)
GFR, Estimated: >60 mL/min (ref >60)
Glucose, Bld: 284 mg/dL — ABNORMAL HIGH (ref 70–99)
Potassium: 3.8 mmol/L (ref 3.5–5.1)
Sodium: 136 mmol/L (ref 135–145)

## 2022-08-23 LAB — ECHOCARDIOGRAM COMPLETE
AR max vel: 2.91 cm2
AV Area VTI: 2.65 cm2
AV Area mean vel: 2.84 cm2
AV Mean grad: 4 mmHg
AV Peak grad: 8.2 mmHg
Ao pk vel: 1.43 m/s
Area-P 1/2: 4.15 cm2
Height: 62 in
S' Lateral: 2.7 cm
Weight: 3200 oz

## 2022-08-23 LAB — GLUCOSE, CAPILLARY
Glucose-Capillary: 261 mg/dL — ABNORMAL HIGH (ref 70–99)
Glucose-Capillary: 284 mg/dL — ABNORMAL HIGH (ref 70–99)
Glucose-Capillary: 231 mg/dL — ABNORMAL HIGH (ref 70–99)
Glucose-Capillary: 166 mg/dL — ABNORMAL HIGH (ref 70–99)

## 2022-08-23 LAB — CBC
HCT: 36.9 % (ref 36.0–46.0)
Hemoglobin: 11.2 g/dL — ABNORMAL LOW (ref 12.0–15.0)
MCH: 23.4 pg — ABNORMAL LOW (ref 26.0–34.0)
MCHC: 30.4 g/dL (ref 30.0–36.0)
MCV: 77 fL — ABNORMAL LOW (ref 80.0–100.0)
Platelets: 236 10*3/uL (ref 150–400)
RBC: 4.79 MIL/uL (ref 3.87–5.11)
RDW: 18.3 % — ABNORMAL HIGH (ref 11.5–15.5)
WBC: 9.2 10*3/uL (ref 4.0–10.5)
nRBC: 0 % (ref 0.0–0.2)

## 2022-08-23 LAB — URINALYSIS, MICROSCOPIC (REFLEX): WBC, UA: >50 WBC/hpf (ref 0–5)

## 2022-08-23 MED ORDER — CIPROFLOXACIN HCL 0.3 % OP SOLN
2.0000 [drp] | OPHTHALMIC | Status: DC
  Administered 2022-08-23 – 2022-08-24 (×4): 2 [drp] via OPHTHALMIC
  Filled 2022-08-23: qty 2.5

## 2022-08-23 MED ORDER — SODIUM CHLORIDE 0.9 % IV SOLN
1.0000 g | Freq: Three times a day (TID) | INTRAVENOUS | Status: DC
  Administered 2022-08-23 – 2022-08-24 (×2): 1 g via INTRAVENOUS
  Filled 2022-08-23 (×4): qty 5

## 2022-08-23 MED ORDER — SODIUM CHLORIDE 0.9 % IV SOLN: INTRAVENOUS | Status: AC

## 2022-08-23 NOTE — Progress Notes (Addendum)
PROGRESS NOTE    Kelly Richardson  [id number] DOB: 1951-05-01 DOA: 08/22/2022 PCP: Kelly Harvey, NP  Outpatient Specialists:     Brief Narrative:  Patient is a 72 year old female past medical history significant for hypertension, diabetes mellitus type 2 and seizures.  Patient was admitted following a fall at home.  There is also question of possible syncope.  Patient tells me that she fell.  MRI brain is nonrevealing.  Echocardiogram is nonrevealing.  Patient was noted to be volume depleted.  Will continue IV fluids.  Will monitor orthostasis.  Patient reported dysuria earlier today with some pyuria.  Will proceed with urine culture.  Patient has multiple drug allergies.  Patient's home was also said to have feces and urine everywhere.   Assessment & Plan:   Principal Problem:   Fall at home Active Problems:   Uncontrolled type 2 diabetes mellitus with hyperglycemia, with long-term current use of insulin (HCC)   HTN (hypertension)   Leukocytosis   Obesity (BMI 30-39.9)   Syncope   Fall -Not certain if mechanical fall or syncopal.   -Patient tells me today that she fell. -Patient is volume depleted. -There are concerns for possible UTI. -IV fluids. -Orthostatic vital signs. -PT OT input. -Further management will depend on hospital course.  Volume depletion: -IV fluids.  Possible UTI: -Pyuria noted. -Rare bacteria noted. -Follow urine culture. -Start IV aztreonam.  Patient has penicillin allergy.   Leukocytosis -Resolved. -WBC is 9.2 today.     Uncontrolled Diabetes mellitus type 2, with long-term use of insulin -Uncontrolled. -Optimize diabetes mellitus management. -Discussed compliance with patient. -A1c was 9.8%.   Essential hypertension -Controlled. -Orthostatic vital signs.  Obesity BMI 36.5 8 kg/m     DVT prophylaxis: Subcutaneous Lovenox Code Status: Full code Family Communication:  Disposition Plan: Home eventually.   Consultants:   None  Procedures:  None  Antimicrobials:  Start IV aztreonam   Subjective: No fever or chills. Reported dysuria (as per collateral information)  Objective: Vitals:   08/22/22 1307 08/23/22 0506 08/23/22 0723 08/23/22 1558  BP:  119/61 129/62   Pulse: 87 94 88   Resp:  16 18 18  $ Temp: 97.9 F (36.6 C) 97.8 F (36.6 C) 97.7 F (36.5 C) 98 F (36.7 C)  TempSrc: Oral Oral Oral   SpO2:  98% 98%   Weight:      Height:        Intake/Output Summary (Last 24 hours) at 08/23/2022 1827 Last data filed at 08/23/2022 1623 Gross per 24 hour  Intake 635.24 ml  Output --  Net 635.24 ml   Filed Weights   08/22/22 0247  Weight: 90.7 kg    Examination:  General exam: Appears calm and comfortable  Respiratory system: Clear to auscultation.  Cardiovascular system: S1 & S2 heard. Gastrointestinal system: Abdomen is obese and nontender.   Central nervous system: Alert and oriented.  Patient moves all extremities. Extremities: No leg edema.  Data Reviewed: I have personally reviewed following labs and imaging studies  CBC: Recent Labs  Lab 08/22/22 0321 08/22/22 0358 08/23/22 0343  WBC 14.2*  --  9.2  NEUTROABS 11.2*  --   --   HGB 13.1 14.6 11.2*  HCT 43.9 43.0 36.9  MCV 78.5*  --  77.0*  PLT 245  --  [id number]   Basic Metabolic Panel: Recent Labs  Lab 08/22/22 0321 08/22/22 0358 08/23/22 0343  NA 134* 136 136  K 4.4 4.3 3.8  CL 99  --  101  CO2 21*  --  22  GLUCOSE 337*  --  284*  BUN 13  --  12  CREATININE 1.00  --  0.86  CALCIUM 9.0  --  8.6*   GFR: Estimated Creatinine Clearance: 64.6 mL/min (by C-G formula based on SCr of 0.86 mg/dL). Liver Function Tests: Recent Labs  Lab 08/22/22 0321  AST 39  ALT 30  ALKPHOS 84  BILITOT 1.0  PROT 7.4  ALBUMIN 3.5   No results for input(s): "LIPASE", "AMYLASE" in the last 168 hours. No results for input(s): "AMMONIA" in the last 168 hours. Coagulation Profile: No results for input(s): "INR", "PROTIME" in the  last 168 hours. Cardiac Enzymes: Recent Labs  Lab 08/22/22 0321  CKTOTAL 76   BNP (last 3 results) No results for input(s): "PROBNP" in the last 8760 hours. HbA1C: Recent Labs    08/22/22 0321  HGBA1C 9.8*   CBG: Recent Labs  Lab 08/22/22 1646 08/22/22 2217 08/23/22 0720 08/23/22 1156 08/23/22 1759  GLUCAP 373* 252* 261* 284* 231*   Lipid Profile: No results for input(s): "CHOL", "HDL", "LDLCALC", "TRIG", "CHOLHDL", "LDLDIRECT" in the last 72 hours. Thyroid Function Tests: No results for input(s): "TSH", "T4TOTAL", "FREET4", "T3FREE", "THYROIDAB" in the last 72 hours. Anemia Panel: No results for input(s): "VITAMINB12", "FOLATE", "FERRITIN", "TIBC", "IRON", "RETICCTPCT" in the last 72 hours. Urine analysis:    Component Value Date/Time   COLORURINE YELLOW 08/23/2022 Faith 08/23/2022 1143   LABSPEC 1.015 08/23/2022 1143   PHURINE 5.5 08/23/2022 1143   GLUCOSEU >=500 (A) 08/23/2022 1143   GLUCOSEU NEGATIVE 07/26/2006 1044   HGBUR NEGATIVE 08/23/2022 1143   BILIRUBINUR NEGATIVE 08/23/2022 1143   KETONESUR NEGATIVE 08/23/2022 1143   PROTEINUR NEGATIVE 08/23/2022 1143   UROBILINOGEN 0.2 09/08/2012 1444   NITRITE NEGATIVE 08/23/2022 1143   LEUKOCYTESUR SMALL (A) 08/23/2022 1143   Sepsis Labs: @LABRCNTIP$ (procalcitonin:4,lacticidven:4)  )No results found for this or any previous visit (from the past 240 hour(s)).       Radiology Studies: ECHOCARDIOGRAM COMPLETE  Result Date: 08/23/2022    ECHOCARDIOGRAM REPORT   Patient Name:   Kelly Richardson [facility] Date of Exam: 08/23/2022 Medical Rec #:  [id number]        Height:       62.0 in Accession #:    [id number]       Weight:       200.0 lb Date of Birth:  Oct 16, 1950       BSA:          1.912 m Patient Age:    25 years         BP:           129/62 mmHg Patient Gender: F                HR:           96 bpm. Exam Location:  Inpatient Procedure: 2D Echo, Cardiac Doppler and Color Doppler Indications:    Syncope  R55  History:        Patient has prior history of Echocardiogram examinations, most                 recent 05/30/2019. Signs/Symptoms:Chest Pain; Risk                 Factors:Hypertension, Diabetes and Dyslipidemia.  Sonographer:    Kelly Richardson Referring Phys: [id number] Kelly Richardson IMPRESSIONS  1. Left ventricular ejection fraction, by estimation, is 60 to 65%. The  left ventricle has normal function. The left ventricle has no regional wall motion abnormalities. Left ventricular diastolic parameters were normal.  2. Right ventricular systolic function is normal. The right ventricular size is normal.  3. Left atrial size was mildly dilated.  4. The mitral valve is normal in structure. No evidence of mitral valve regurgitation. No evidence of mitral stenosis.  5. The aortic valve was not well visualized. There is mild calcification of the aortic valve. Aortic valve regurgitation is not visualized. Aortic valve sclerosis is present, with no evidence of aortic valve stenosis.  6. The inferior vena cava is normal in size with greater than 50% respiratory variability, suggesting right atrial pressure of 3 mmHg. FINDINGS  Left Ventricle: Left ventricular ejection fraction, by estimation, is 60 to 65%. The left ventricle has normal function. The left ventricle has no regional wall motion abnormalities. The left ventricular internal cavity size was normal in size. There is  no left ventricular hypertrophy. Left ventricular diastolic parameters were normal. Right Ventricle: The right ventricular size is normal. No increase in right ventricular wall thickness. Right ventricular systolic function is normal. Left Atrium: Left atrial size was mildly dilated. Right Atrium: Right atrial size was normal in size. Pericardium: There is no evidence of pericardial effusion. Mitral Valve: The mitral valve is normal in structure. No evidence of mitral valve regurgitation. No evidence of mitral valve stenosis. Tricuspid Valve: The  tricuspid valve is normal in structure. Tricuspid valve regurgitation is not demonstrated. No evidence of tricuspid stenosis. Aortic Valve: The aortic valve was not well visualized. There is mild calcification of the aortic valve. Aortic valve regurgitation is not visualized. Aortic valve sclerosis is present, with no evidence of aortic valve stenosis. Aortic valve mean gradient measures 4.0 mmHg. Aortic valve peak gradient measures 8.2 mmHg. Aortic valve area, by VTI measures 2.65 cm. Pulmonic Valve: The pulmonic valve was normal in structure. Pulmonic valve regurgitation is not visualized. No evidence of pulmonic stenosis. Aorta: The aortic root is normal in size and structure. Venous: The inferior vena cava is normal in size with greater than 50% respiratory variability, suggesting right atrial pressure of 3 mmHg. IAS/Shunts: No atrial level shunt detected by color flow Doppler.  LEFT VENTRICLE PLAX 2D LVIDd:         3.90 cm   Diastology LVIDs:         2.70 cm   LV e' medial:    8.60 cm/s LV PW:         0.80 cm   LV E/e' medial:  10.7 LV IVS:        0.80 cm   LV e' lateral:   12.10 cm/s LVOT diam:     2.00 cm   LV E/e' lateral: 7.6 LV SV:         75 LV SV Index:   39 LVOT Area:     3.14 cm                           3D Volume EF:                          3D EF:        56 %                          LV EDV:       74 ml  LV ESV:       33 ml                          LV SV:        41 ml RIGHT VENTRICLE             IVC RV S prime:     14.20 cm/s  IVC diam: 1.40 cm TAPSE (M-mode): 2.1 cm LEFT ATRIUM             Index        RIGHT ATRIUM           Index LA diam:        3.90 cm 2.04 cm/m   RA Area:     15.00 cm LA Vol (A2C):   23.4 ml 12.24 ml/m  RA Volume:   32.60 ml  17.05 ml/m LA Vol (A4C):   33.2 ml 17.37 ml/m LA Biplane Vol: 28.0 ml 14.65 ml/m  AORTIC VALVE AV Area (Vmax):    2.91 cm AV Area (Vmean):   2.84 cm AV Area (VTI):     2.65 cm AV Vmax:           143.00 cm/s AV Vmean:           97.700 cm/s AV VTI:            0.282 m AV Peak Grad:      8.2 mmHg AV Mean Grad:      4.0 mmHg LVOT Vmax:         132.33 cm/s LVOT Vmean:        88.333 cm/s LVOT VTI:          0.238 m LVOT/AV VTI ratio: 0.84  AORTA Ao Root diam: 3.00 cm Ao Asc diam:  2.70 cm MITRAL VALVE MV Area (PHT): 4.15 cm     SHUNTS MV Decel Time: 183 msec     Systemic VTI:  0.24 m MV E velocity: 92.00 cm/s   Systemic Diam: 2.00 cm MV A velocity: 106.00 cm/s MV E/A ratio:  0.87 Jenkins Rouge MD Electronically signed by Jenkins Rouge MD Signature Date/Time: 08/23/2022/11:08:48 AM    Final    MR BRAIN WO CONTRAST  Result Date: 08/22/2022 CLINICAL DATA:  Stroke, follow-up EXAM: MRI HEAD WITHOUT CONTRAST TECHNIQUE: Multiplanar, multiecho pulse sequences of the brain and surrounding structures were obtained without intravenous contrast. COMPARISON:  05/29/2019 MRI head, correlation is also made with CT head 08/22/2022 FINDINGS: Brain: No restricted diffusion to suggest acute or subacute infarct. No acute hemorrhage, mass, mass effect, or midline shift. No hydrocephalus or extra-axial collection. Normal pituitary and craniocervical junction. No hemosiderin deposition to suggest remote hemorrhage. Scattered T2 hyperintense signal in the periventricular white matter, likely the sequela of mild chronic small vessel ischemic disease. Vascular: Normal arterial flow voids. Skull and upper cervical spine: Normal marrow signal. Sinuses/Orbits: Mucosal thickening in the left maxillary sinus and bilateral ethmoid air cells. Status post bilateral lens replacements. Other: The mastoid air cells are well aerated. IMPRESSION: No acute intracranial process. No evidence of acute or subacute infarct. Electronically Signed   By: Merilyn Baba M.D.   On: 08/22/2022 22:12   CT HEAD WO CONTRAST (5MM)  Result Date: 08/22/2022 CLINICAL DATA:  Facial trauma, unwitnessed fall. EXAM: CT HEAD WITHOUT CONTRAST CT CERVICAL SPINE WITHOUT CONTRAST TECHNIQUE: Multidetector  CT imaging of the head and cervical spine was performed following the standard protocol without intravenous contrast. Multiplanar CT  image reconstructions of the cervical spine were also generated. RADIATION DOSE REDUCTION: This exam was performed according to the departmental dose-optimization program which includes automated exposure control, adjustment of the mA and/or kV according to patient size and/or use of iterative reconstruction technique. COMPARISON:  None Available. FINDINGS: CT HEAD FINDINGS Brain: No evidence of acute infarction, hemorrhage, hydrocephalus, extra-axial collection or mass lesion/mass effect. Vascular: No hyperdense vessel or unexpected calcification. Skull: Normal. Negative for fracture or focal lesion. Sinuses/Orbits: No evidence of injury CT CERVICAL SPINE FINDINGS Alignment: Normal. Skull base and vertebrae: No acute fracture. No primary bone lesion or focal pathologic process. Soft tissues and spinal canal: No prevertebral fluid or swelling. No visible canal hematoma. Disc levels:  Ordinary and generalized cervical spine degeneration Upper chest: No evidence of injury IMPRESSION: No evidence of intracranial or cervical spine injury. Electronically Signed   By: Jorje Guild M.D.   On: 08/22/2022 05:03   CT Cervical Spine Wo Contrast  Result Date: 08/22/2022 CLINICAL DATA:  Facial trauma, unwitnessed fall. EXAM: CT HEAD WITHOUT CONTRAST CT CERVICAL SPINE WITHOUT CONTRAST TECHNIQUE: Multidetector CT imaging of the head and cervical spine was performed following the standard protocol without intravenous contrast. Multiplanar CT image reconstructions of the cervical spine were also generated. RADIATION DOSE REDUCTION: This exam was performed according to the departmental dose-optimization program which includes automated exposure control, adjustment of the mA and/or kV according to patient size and/or use of iterative reconstruction technique. COMPARISON:  None Available. FINDINGS:  CT HEAD FINDINGS Brain: No evidence of acute infarction, hemorrhage, hydrocephalus, extra-axial collection or mass lesion/mass effect. Vascular: No hyperdense vessel or unexpected calcification. Skull: Normal. Negative for fracture or focal lesion. Sinuses/Orbits: No evidence of injury CT CERVICAL SPINE FINDINGS Alignment: Normal. Skull base and vertebrae: No acute fracture. No primary bone lesion or focal pathologic process. Soft tissues and spinal canal: No prevertebral fluid or swelling. No visible canal hematoma. Disc levels:  Ordinary and generalized cervical spine degeneration Upper chest: No evidence of injury IMPRESSION: No evidence of intracranial or cervical spine injury. Electronically Signed   By: Jorje Guild M.D.   On: 08/22/2022 05:03   DG Chest 1 View  Result Date: 08/22/2022 CLINICAL DATA:  fall EXAM: CHEST  1 VIEW COMPARISON:  Chest x-ray 05/29/2019 FINDINGS: The heart and mediastinal contours are within normal limits. No focal consolidation. No pulmonary edema. No pleural effusion. No pneumothorax. No acute osseous abnormality. IMPRESSION: No active disease. Electronically Signed   By: Iven Finn M.D.   On: 08/22/2022 03:06   DG Ankle Complete Right  Result Date: 08/22/2022 CLINICAL DATA:  Fall, ankle pain. EXAM: RIGHT ANKLE - COMPLETE 3+ VIEW COMPARISON:  None Available. FINDINGS: There is no evidence of fracture, dislocation, or joint effusion. Mild degenerative changes are noted at the medial malleolus. Soft tissue swelling is present about the ankle. IMPRESSION: No acute fracture or dislocation. Electronically Signed   By: Brett Fairy M.D.   On: 08/22/2022 03:04        Scheduled Meds:  enoxaparin (LOVENOX) injection  40 mg Subcutaneous Q24H   insulin aspart  0-20 Units Subcutaneous TID WC   insulin aspart  0-5 Units Subcutaneous QHS   insulin detemir  40 Units Subcutaneous QHS   sodium chloride flush  3 mL Intravenous Q12H   Continuous Infusions:  sodium  chloride 75 mL/hr at 08/23/22 1404     LOS: 0 days    Time spent: 55 minutes    Dana Allan, MD  [facility]  Pager #: [phone] 7PM-7AM contact night coverage as above

## 2022-08-23 NOTE — Progress Notes (Signed)
Physical Therapy Treatment Patient Details Name: Kelly Richardson MRN: [id number] DOB: [birth year] Today's Date: 08/23/2022   History of Present Illness 72 y.o. female admitted 2/17 presenting with episode of unwitnessed fall versus syncope. Patient with a history of diabetes, hypertension, seizures presenting with episode of unwitnessed fall versus syncope.    PT Comments    Continuing work on functional mobility and activity tolerance;  session focused on progressive amb;   Overall tolerated well, but with definite benefit from bil UE support; will plan for progressive amb with RW next session  Recommendations for follow up therapy are one component of a multi-disciplinary discharge planning process, led by the attending physician.  Recommendations may be updated based on patient status, additional functional criteria and insurance authorization.  Follow Up Recommendations  Home health PT (*Anticipate she will progress quickly however noted concerns over home environment. If patient fails to progress to a mod I level may consider SNF for short term rehab.)     Assistance Recommended at Discharge Intermittent Supervision/Assistance  Patient can return home with the following A little help with walking and/or transfers;A little help with bathing/dressing/bathroom;Assistance with cooking/housework;Assist for transportation;Help with stairs or ramp for entrance   Equipment Recommendations  Rolling walker (2 wheels)    Recommendations for Other Services       Precautions / Restrictions Precautions Precautions: Fall Restrictions Weight Bearing Restrictions: No     Mobility  Bed Mobility Overal bed mobility: Needs Assistance Bed Mobility: Supine to Sit, Sit to Supine     Supine to sit: Supervision Sit to supine: Supervision   General bed mobility comments: Supervision for safety. Slow, effortful, performed without physical assist.    Transfers Overall transfer level: Needs  assistance Equipment used: 1 person hand held assist Transfers: Sit to/from Stand Sit to Stand: Min guard           General transfer comment: Handheld assist to steady    Ambulation/Gait Ambulation/Gait assistance: Min guard Gait Distance (Feet): 200 Feet Assistive device: IV Pole, 1 person hand held assist Gait Pattern/deviations: Step-through pattern       General Gait Details: Overall tolerated well, but with definite benefit from bil UE support; will plan for progressive amb with rW next session   Stairs             Wheelchair Mobility    Modified Rankin (Stroke Patients Only)       Balance     Sitting balance-Leahy Scale: Good       Standing balance-Leahy Scale: Poor                              Cognition Arousal/Alertness: Awake/alert Behavior During Therapy: WFL for tasks assessed/performed Overall Cognitive Status: Impaired/Different from baseline Area of Impairment: Orientation, Problem solving                 Orientation Level: Disoriented to, Time           Problem Solving: Slow processing General Comments: Unsure of year inially, but with increased time able to recall; able to recall month easily. Some slowed processing during session        Exercises      General Comments        Pertinent Vitals/Pain Pain Assessment Pain Assessment: No/denies pain    Home Living  Prior Function            PT Goals (current goals can now be found in the care plan section) Acute Rehab PT Goals Patient Stated Goal: Go home PT Goal Formulation: With patient Time For Goal Achievement: 09/05/22 Potential to Achieve Goals: Good Progress towards PT goals: Progressing toward goals    Frequency    Min 3X/week      PT Plan Current plan remains appropriate    Co-evaluation              AM-PAC PT "6 Clicks" Mobility   Outcome Measure  Help needed turning from your back  to your side while in a flat bed without using bedrails?: None Help needed moving from lying on your back to sitting on the side of a flat bed without using bedrails?: A Little Help needed moving to and from a bed to a chair (including a wheelchair)?: A Little Help needed standing up from a chair using your arms (e.g., wheelchair or bedside chair)?: A Little Help needed to walk in hospital room?: A Little Help needed climbing 3-5 steps with a railing? : A Little 6 Click Score: 19    End of Session Equipment Utilized During Treatment: Gait belt Activity Tolerance: Patient tolerated treatment well Patient left: in bed;with call bell/phone within reach Nurse Communication: Mobility status PT Visit Diagnosis: Muscle weakness (generalized) (M62.81);History of falling (Z91.81);Difficulty in walking, not elsewhere classified (R26.2)     Time: [id number] PT Time Calculation (min) (ACUTE ONLY): 18 min  Charges:  $Gait Training: 8-22 mins                     Roney Marion, PT  Acute Rehabilitation Services Office [place] 08/23/2022, 5:42 PM

## 2022-08-23 NOTE — Plan of Care (Signed)

## 2022-08-23 NOTE — Progress Notes (Signed)
Pt. C/o burning when urinating, UA ordered specimen cup left in room

## 2022-08-23 NOTE — Progress Notes (Signed)
Echocardiogram 2D Echocardiogram has been performed.  Kelly Richardson 08/23/2022, 10:43 AM

## 2022-08-23 NOTE — Evaluation (Signed)
Occupational Therapy Evaluation Patient Details Name: Kelly Richardson MRN: [id number] DOB: March 09, 1951 Today's Date: 08/23/2022   History of Present Illness 72 y.o. female admitted 2/17 presenting with episode of unwitnessed fall versus syncope. Patient with a history of diabetes, hypertension, seizures presenting with episode of unwitnessed fall versus syncope.   Clinical Impression   PTA, pt was independent for ADL. Upon eval, pt presents with decreased orientation, problem solving, safety, strength, and balance. Pt performing UB ADL with set-up and LB ADL with min guard A. Pt requires increased time for processing and recalling information, but able with time. Recommending HHOT to optimize safety and independence with ADL and IADL.      Recommendations for follow up therapy are one component of a multi-disciplinary discharge planning process, led by the attending physician.  Recommendations may be updated based on patient status, additional functional criteria and insurance authorization.   Follow Up Recommendations  Home health OT     Assistance Recommended at Discharge Intermittent Supervision/Assistance  Patient can return home with the following A little help with walking and/or transfers;A little help with bathing/dressing/bathroom;Assistance with cooking/housework;Assist for transportation;Help with stairs or ramp for entrance;Direct supervision/assist for financial management;Direct supervision/assist for medications management    Functional Status Assessment  Patient has had a recent decline in their functional status and demonstrates the ability to make significant improvements in function in a reasonable and predictable amount of time.  Equipment Recommendations  BSC/3in1;Other (comment) (rw)    Recommendations for Other Services       Precautions / Restrictions Precautions Precautions: Fall Restrictions Weight Bearing Restrictions: No      Mobility Bed  Mobility Overal bed mobility: Needs Assistance Bed Mobility: Supine to Sit, Sit to Supine     Supine to sit: Supervision Sit to supine: Supervision   General bed mobility comments: Supervision for safety. Slow, effortful, performed without physical assist.    Transfers Overall transfer level: Needs assistance Equipment used: Rolling walker (2 wheels) Transfers: Sit to/from Stand, Bed to chair/wheelchair/BSC Sit to Stand: Min guard     Step pivot transfers: Min guard     General transfer comment: Cues for hand placement      Balance Overall balance assessment: Needs assistance Sitting-balance support: No upper extremity supported, Feet supported Sitting balance-Leahy Scale: Good     Standing balance support: Single extremity supported Standing balance-Leahy Scale: Poor                             ADL either performed or assessed with clinical judgement   ADL Overall ADL's : Needs assistance/impaired Eating/Feeding: Modified independent;Sitting   Grooming: Min guard;Standing   Upper Body Bathing: Set up;Sitting   Lower Body Bathing: Min guard;Sit to/from stand   Upper Body Dressing : Set up;Sitting   Lower Body Dressing: Min guard;Sit to/from stand   Toilet Transfer: Min guard;Rolling walker (2 wheels);Ambulation;Comfort height toilet   Toileting- Clothing Manipulation and Hygiene: Set up;Sitting/lateral lean       Functional mobility during ADLs: Min guard;Rolling walker (2 wheels)       Vision Ability to See in Adequate Light: 0 Adequate Patient Visual Report: No change from baseline Vision Assessment?: No apparent visual deficits     Perception     Praxis      Pertinent Vitals/Pain Pain Assessment Pain Assessment: No/denies pain     Hand Dominance Right   Extremity/Trunk Assessment Upper Extremity Assessment Upper Extremity Assessment: Generalized weakness  Lower Extremity Assessment Lower Extremity Assessment: Defer to PT  evaluation       Communication Communication Communication: No difficulties   Cognition Arousal/Alertness: Awake/alert Behavior During Therapy: WFL for tasks assessed/performed Overall Cognitive Status: Impaired/Different from baseline Area of Impairment: Orientation, Problem solving                 Orientation Level: Disoriented to, Time           Problem Solving: Slow processing General Comments: Unsure of year inially, but with increased time able to recall; able to recall month easily. Some slowed processing during session     General Comments       Exercises     Shoulder Instructions      Home Living Family/patient expects to be discharged to:: Private residence Living Arrangements: Spouse/significant other Available Help at Discharge: Family;Other (Comment) (boyfriend; stays with pt, but works) Type of Home: House Home Access: Stairs to enter Technical brewer of Steps: 4 Entrance Stairs-Rails: [place]: One level     Bathroom Shower/Tub: Teacher, early years/pre: Standard Bathroom Accessibility: No   Home Equipment: None          Prior Functioning/Environment Prior Level of Function : Independent/Modified Independent;History of Falls (last six months)             Mobility Comments: Reports independent, one fall a couple of months ago, does not recall why she fell.          OT Problem List: Decreased strength;Decreased activity tolerance;Impaired balance (sitting and/or standing);Decreased cognition;Decreased safety awareness;Decreased knowledge of use of DME or AE      OT Treatment/Interventions: Self-care/ADL training;Therapeutic exercise;DME and/or AE instruction;Patient/family education;Balance training;Therapeutic activities    OT Goals(Current goals can be found in the care plan section) Acute Rehab OT Goals Patient Stated Goal: go home OT Goal Formulation: With patient Time For Goal Achievement:  09/06/22 Potential to Achieve Goals: Good  OT Frequency: Min 2X/week    Co-evaluation              AM-PAC OT "6 Clicks" Daily Activity     Outcome Measure Help from another person eating meals?: None Help from another person taking care of personal grooming?: A Little Help from another person toileting, which includes using toliet, bedpan, or urinal?: A Little Help from another person bathing (including washing, rinsing, drying)?: A Little Help from another person to put on and taking off regular upper body clothing?: A Little Help from another person to put on and taking off regular lower body clothing?: A Little 6 Click Score: 19   End of Session Equipment Utilized During Treatment: Gait belt;Rolling walker (2 wheels) Nurse Communication: Mobility status  Activity Tolerance: Patient tolerated treatment well Patient left: in bed;with call bell/phone within reach;with bed alarm set  OT Visit Diagnosis: Unsteadiness on feet (R26.81);Muscle weakness (generalized) (M62.81);Other abnormalities of gait and mobility (R26.89);Other symptoms and signs involving cognitive function                Time: 0911-0940 OT Time Calculation (min): 29 min Charges:  OT General Charges $OT Visit: 1 Visit OT Evaluation $OT Eval Low Complexity: 1 Low OT Treatments $Self Care/Home Management : 8-22 mins  Elder Cyphers, OTR/L [facility] Acute Rehabilitation Office: [phone]   Magnus Ivan 08/23/2022, 1:14 PM

## 2022-08-24 DIAGNOSIS — E1165 Type 2 diabetes mellitus with hyperglycemia: Secondary | ICD-10-CM | POA: Diagnosis not present

## 2022-08-24 DIAGNOSIS — W19XXXA Unspecified fall, initial encounter: Secondary | ICD-10-CM | POA: Diagnosis not present

## 2022-08-24 DIAGNOSIS — Y92009 Unspecified place in unspecified non-institutional (private) residence as the place of occurrence of the external cause: Secondary | ICD-10-CM | POA: Diagnosis not present

## 2022-08-24 LAB — RENAL FUNCTION PANEL
Albumin: 2.7 g/dL — ABNORMAL LOW (ref 3.5–5.0)
Anion gap: 8 (ref 5–15)
BUN: 10 mg/dL (ref 8–23)
CO2: 24 mmol/L (ref 22–32)
Calcium: 8 mg/dL — ABNORMAL LOW (ref 8.9–10.3)
Chloride: 104 mmol/L (ref 98–111)
Creatinine, Ser: 0.72 mg/dL (ref 0.44–1.00)
GFR, Estimated: >60 mL/min (ref >60)
Glucose, Bld: 205 mg/dL — ABNORMAL HIGH (ref 70–99)
Phosphorus: 2.3 mg/dL — ABNORMAL LOW (ref 2.5–4.6)
Potassium: 3.7 mmol/L (ref 3.5–5.1)
Sodium: 136 mmol/L (ref 135–145)

## 2022-08-24 LAB — GLUCOSE, CAPILLARY
Glucose-Capillary: 207 mg/dL — ABNORMAL HIGH (ref 70–99)
Glucose-Capillary: 195 mg/dL — ABNORMAL HIGH (ref 70–99)

## 2022-08-24 LAB — CBC WITH DIFFERENTIAL/PLATELET
Abs Immature Granulocytes: 0.04 10*3/uL (ref 0.00–0.07)
Basophils Absolute: 0 10*3/uL (ref 0.0–0.1)
Basophils Relative: 0 %
Eosinophils Absolute: 0.3 10*3/uL (ref 0.0–0.5)
Eosinophils Relative: 4 %
HCT: 34.7 % — ABNORMAL LOW (ref 36.0–46.0)
Hemoglobin: 10.4 g/dL — ABNORMAL LOW (ref 12.0–15.0)
Immature Granulocytes: 1 %
Lymphocytes Relative: 27 %
Lymphs Abs: 2.4 10*3/uL (ref 0.7–4.0)
MCH: 23.3 pg — ABNORMAL LOW (ref 26.0–34.0)
MCHC: 30 g/dL (ref 30.0–36.0)
MCV: 77.8 fL — ABNORMAL LOW (ref 80.0–100.0)
Monocytes Absolute: 0.8 10*3/uL (ref 0.1–1.0)
Monocytes Relative: 10 %
Neutro Abs: 5 10*3/uL (ref 1.7–7.7)
Neutrophils Relative %: 58 %
Platelets: 215 10*3/uL (ref 150–400)
RBC: 4.46 MIL/uL (ref 3.87–5.11)
RDW: 18.2 % — ABNORMAL HIGH (ref 11.5–15.5)
WBC: 8.6 10*3/uL (ref 4.0–10.5)
nRBC: 0 % (ref 0.0–0.2)

## 2022-08-24 LAB — URINE CULTURE: Culture: <10000 — AB

## 2022-08-24 LAB — MAGNESIUM: Magnesium: 1.9 mg/dL (ref 1.7–2.4)

## 2022-08-24 MED ORDER — LIVING WELL WITH DIABETES BOOK
Freq: Once | Status: AC
  Filled 2022-08-24: qty 1

## 2022-08-24 MED ORDER — INSULIN DETEMIR 100 UNIT/ML ~~LOC~~ SOLN
50.0000 [IU] | Freq: Every day | SUBCUTANEOUS | Status: DC
  Filled 2022-08-24: qty 0.5

## 2022-08-24 NOTE — Discharge Summary (Signed)
Physician Discharge Summary  Kelly Richardson [id number] DOB: Nov 11, 1950 DOA: 08/22/2022  PCP: Berkley Harvey, NP  Admit date: 08/22/2022 Discharge date: 08/24/2022  Admitted From: Home Discharge disposition: Home  Recommendations at discharge:  Ensure compliance with medications including insulin  Brief narrative: Kelly Richardson is a 72 y.o. female with PMH significant for DM2, HTN, history of seizure disorder 2/17, patient was brought to the ED by EMS after an unwitnessed fall.  Patient is unsure of the mechanical fall.  She hit the right side of her head against an unknown object.  She did not pass out but was on the floor for about 15 minutes.  She started having neck pain and back pain right after the fall. Of note, EMS reported concerns over cleanness of the house as there was urine and feces throughout the home and she herself was covered in urine and feces.  In the ED, she looked dehydrated.   Urinalysis showed clear yellow urine with more than 500 glucose, small leukocytes, negative nitrate MRI brain was unrevealing. Admitted for further workup  Subjective: Patient was seen and examined this morning.  Patient elderly Caucasian female.  Lying down in bed.  Not in distress.  Walked 30 feet with mobility specialist this morning..  Feels ready to go home. In the last 24 hours, hemodynamically stable Last set of labs from this morning with hemoglobin 10.4, phosphorus low at 2.3 Blood glucose elevated over 200 persistently  Hospital course: Fall at home Not certain if mechanical fall or syncopal.   Looked volume depleted on exam.  Given adequate IV hydration MRI brain unremarkable PT/OT eval obtained.  Home with PT recommended.  But patient states to me that she does not want anybody to come to her house.  Abnormal urinalysis Pyuria noted..  No symptoms of UTI.  Patient was empirically started on IV aztreonam.  No further need of antibiotics.  UTI ruled out. Recent  Labs  Lab 08/22/22 0321 08/23/22 0343 08/24/22 0455  WBC 14.2* 9.2 8.6   Type 2 diabetes mellitus uncontrolled with hyperglycemia A1c 9.8 on 08/22/2022 PTA on Levemir 60 units nightly and NovoLog sliding scale insulin. Currently blood sugar level is running over 200 on Levemir 40 units nightly and sliding scale insulin. Post discharge, patient will resume previous regimen.  Encouraged to improve compliance with insulin. Recent Labs  Lab 08/23/22 1156 08/23/22 1759 08/23/22 2117 08/24/22 0716 08/24/22 1120  GLUCAP 284* 231* 166* 207* 195*    Obesity BMI 36.5 8 kg/m  Reported poor living condition at home EMS reported concerns over cleanness of the house as there was urine and feces throughout the home and she herself was covered in urine and feces.  I discussed this with patient this morning.  She states that she has a cat at home.  Her cat knocked over the litter box and made a mess at home right before EMS came.  She states she is having somebody to come and clean.  She is not concerned about her living condition  Mobility: Ambulated with PT.  Home with PT recommended but patient does not want anybody to come home.  Goals of care   Code Status: Full Code   Wounds:  -    Discharge Exam:   Vitals:   08/23/22 1558 08/23/22 2100 08/24/22 0600 08/24/22 0719  BP:  139/73 139/71 139/70  Pulse:  85 87 88  Resp: 18 17 19 16  $ Temp: 98 F (36.7 C) 98 F (36.7  C) 98 F (36.7 C) 98 F (36.7 C)  TempSrc:  Oral Oral Oral  SpO2:  98% 97% 98%  Weight:      Height:        Body mass index is 36.58 kg/m.  General exam: Pleasant, elderly.  Not in distress Skin: No rashes, lesions or ulcers. HEENT: Atraumatic, normocephalic, no obvious bleeding Lungs: Clear to auscultation bilaterally CVS: Regular rate and rhythm, no murmur GI/Abd soft, nontender, nondistended, bowel sound present CNS: Alert, awake, oriented x 3 Psychiatry: Mood appropriate Extremities: No pedal edema, no  calf tenderness  Follow ups:    Follow-up Information     Berkley Harvey, NP Follow up.   Specialty: Nurse Practitioner Contact information: [place] [address] [phone]                 Discharge Instructions:   Discharge Instructions     Call MD for:  difficulty breathing, headache or visual disturbances   Complete by: As directed    Call MD for:  extreme fatigue   Complete by: As directed    Call MD for:  hives   Complete by: As directed    Call MD for:  persistant dizziness or light-headedness   Complete by: As directed    Call MD for:  persistant nausea and vomiting   Complete by: As directed    Call MD for:  severe uncontrolled pain   Complete by: As directed    Call MD for:  temperature >100.4   Complete by: As directed    Diet general   Complete by: As directed    Discharge instructions   Complete by: As directed    Recommendations at discharge:   Ensure compliance with medications including insulin  Discharge instructions for diabetes mellitus: Check blood sugar 3 times a day and bedtime at home. If blood sugar running above 200 or less than 70 please call your MD to adjust insulin. If you notice signs and symptoms of hypoglycemia (low blood sugar) like jitteriness, confusion, thirst, tremor and sweating, please check blood sugar, drink sugary drink/biscuits/sweets to increase sugar level and call MD or return to ER.    General discharge instructions: Follow with Primary MD Berkley Harvey, NP in 7 days  Please request your PCP  to go over your hospital tests, procedures, radiology results at the follow up. Please get your medicines reviewed and adjusted.  Your PCP may decide to repeat certain labs or tests as needed. Do not drive, operate heavy machinery, perform activities at heights, swimming or participation in water activities or provide baby sitting services if your were admitted for syncope or siezures until you have  seen by Primary MD or a Neurologist and advised to do so again. [place] Controlled Substance Reporting System database was reviewed. Do not drive, operate heavy machinery, perform activities at heights, swim, participate in water activities or provide baby-sitting services while on medications for pain, sleep and mood until your outpatient physician has reevaluated you and advised to do so again.  You are strongly recommended to comply with the dose, frequency and duration of prescribed medications. Activity: As tolerated with Full fall precautions use walker/cane & assistance as needed Avoid using any recreational substances like cigarette, tobacco, alcohol, or non-prescribed drug. If you experience worsening of your admission symptoms, develop shortness of breath, life threatening emergency, suicidal or homicidal thoughts you must seek medical attention immediately by calling 911 or calling your MD immediately  if symptoms less severe. You must read complete instructions/literature along with all the possible adverse reactions/side effects for all the medicines you take and that have been prescribed to you. Take any new medicine only after you have completely understood and accepted all the possible adverse reactions/side effects.  Wear Seat belts while driving. You were cared for by a hospitalist during your hospital stay. If you have any questions about your discharge medications or the care you received while you were in the hospital after you are discharged, you can call the unit and ask to speak with the hospitalist or the covering physician. Once you are discharged, your primary care physician will handle any further medical issues. Please note that NO REFILLS for any discharge medications will be authorized once you are discharged, as it is imperative that you return to your primary care physician (or establish a relationship with a primary care physician if you do not have one).   Increase  activity slowly   Complete by: As directed        Discharge Medications:   Allergies as of 08/24/2022       Reactions   Metformin Other (See Comments)   REACTION: faintness, DIZZINESS   Penicillins Anaphylaxis   Seldane [terfenadine] Other (See Comments)   N/V, increased heart rate, seizures, hallinations.   Sulfonamide Derivatives Anaphylaxis   Vicks Formula 44 Cough Relief [dextromethorphan Hbr] Other (See Comments)   Runs blood pressure up    Xanax [alprazolam] Other (See Comments)   Pt does not want Xanax--history of addiction.   Iohexol Other (See Comments)    Desc: pt states she [place] ct [place] 2000 and immediately experienced profuse vomiting and sweating.  She said they treated her like she was having a reaction and doesn't remember much else or if meds were given., Onset Date: [id number]   Latex Other (See Comments)   BLISTERING   Other Nausea And Vomiting   "Anything I have ever been given more nausea just increases it."   Percocet [oxycodone-acetaminophen] Other (See Comments)   Hallucinations        Medication List     TAKE these medications    Accu-Chek Aviva Plus test strip Generic drug: glucose blood 1 each by Other route daily.   acetaminophen 325 MG tablet Commonly known as: TYLENOL Take 650 mg by mouth every 6 (six) hours as needed for mild pain.   Fifty50 Pen Needles 31G X 8 MM Misc Generic drug: Insulin Pen Needle 1 each by Other route daily.   insulin detemir 100 UNIT/ML FlexPen Commonly known as: Levemir FlexTouch Inject 40 Units into the skin at bedtime. What changed: how much to take   lisinopril 2.5 MG tablet Commonly known as: ZESTRIL Take 2.5 mg by mouth every evening.   NovoLOG FlexPen 100 UNIT/ML FlexPen Generic drug: insulin aspart Inject 16 Units as directed 3 (three) times daily with meals. Per sliding scale and not to exceed 81 units daily.         The results of significant diagnostics from this hospitalization  (including imaging, microbiology, ancillary and laboratory) are listed below for reference.    Procedures and Diagnostic Studies:   ECHOCARDIOGRAM COMPLETE  Result Date: 08/23/2022    ECHOCARDIOGRAM REPORT   Patient Name:   KARELIN DOCKUM [facility] Date of Exam: 08/23/2022 Medical Rec #:  [id number]        Height:       62.0 in Accession #:    [id number]  Weight:       200.0 lb Date of Birth:  10/25/1950       BSA:          1.912 m Patient Age:    50 years         BP:           129/62 mmHg Patient Gender: F                HR:           96 bpm. Exam Location:  Inpatient Procedure: 2D Echo, Cardiac Doppler and Color Doppler Indications:    Syncope R55  History:        Patient has prior history of Echocardiogram examinations, most                 recent 05/30/2019. Signs/Symptoms:Chest Pain; Risk                 Factors:Hypertension, Diabetes and Dyslipidemia.  Sonographer:    Ronny Flurry Referring Phys: [id number] RONDELL A SMITH IMPRESSIONS  1. Left ventricular ejection fraction, by estimation, is 60 to 65%. The left ventricle has normal function. The left ventricle has no regional wall motion abnormalities. Left ventricular diastolic parameters were normal.  2. Right ventricular systolic function is normal. The right ventricular size is normal.  3. Left atrial size was mildly dilated.  4. The mitral valve is normal in structure. No evidence of mitral valve regurgitation. No evidence of mitral stenosis.  5. The aortic valve was not well visualized. There is mild calcification of the aortic valve. Aortic valve regurgitation is not visualized. Aortic valve sclerosis is present, with no evidence of aortic valve stenosis.  6. The inferior vena cava is normal in size with greater than 50% respiratory variability, suggesting right atrial pressure of 3 mmHg. FINDINGS  Left Ventricle: Left ventricular ejection fraction, by estimation, is 60 to 65%. The left ventricle has normal function. The left ventricle has no  regional wall motion abnormalities. The left ventricular internal cavity size was normal in size. There is  no left ventricular hypertrophy. Left ventricular diastolic parameters were normal. Right Ventricle: The right ventricular size is normal. No increase in right ventricular wall thickness. Right ventricular systolic function is normal. Left Atrium: Left atrial size was mildly dilated. Right Atrium: Right atrial size was normal in size. Pericardium: There is no evidence of pericardial effusion. Mitral Valve: The mitral valve is normal in structure. No evidence of mitral valve regurgitation. No evidence of mitral valve stenosis. Tricuspid Valve: The tricuspid valve is normal in structure. Tricuspid valve regurgitation is not demonstrated. No evidence of tricuspid stenosis. Aortic Valve: The aortic valve was not well visualized. There is mild calcification of the aortic valve. Aortic valve regurgitation is not visualized. Aortic valve sclerosis is present, with no evidence of aortic valve stenosis. Aortic valve mean gradient measures 4.0 mmHg. Aortic valve peak gradient measures 8.2 mmHg. Aortic valve area, by VTI measures 2.65 cm. Pulmonic Valve: The pulmonic valve was normal in structure. Pulmonic valve regurgitation is not visualized. No evidence of pulmonic stenosis. Aorta: The aortic root is normal in size and structure. Venous: The inferior vena cava is normal in size with greater than 50% respiratory variability, suggesting right atrial pressure of 3 mmHg. IAS/Shunts: No atrial level shunt detected by color flow Doppler.  LEFT VENTRICLE PLAX 2D LVIDd:         3.90 cm   Diastology LVIDs:  2.70 cm   LV e' medial:    8.60 cm/s LV PW:         0.80 cm   LV E/e' medial:  10.7 LV IVS:        0.80 cm   LV e' lateral:   12.10 cm/s LVOT diam:     2.00 cm   LV E/e' lateral: 7.6 LV SV:         75 LV SV Index:   39 LVOT Area:     3.14 cm                           3D Volume EF:                          3D EF:         56 %                          LV EDV:       74 ml                          LV ESV:       33 ml                          LV SV:        41 ml RIGHT VENTRICLE             IVC RV S prime:     14.20 cm/s  IVC diam: 1.40 cm TAPSE (M-mode): 2.1 cm LEFT ATRIUM             Index        RIGHT ATRIUM           Index LA diam:        3.90 cm 2.04 cm/m   RA Area:     15.00 cm LA Vol (A2C):   23.4 ml 12.24 ml/m  RA Volume:   32.60 ml  17.05 ml/m LA Vol (A4C):   33.2 ml 17.37 ml/m LA Biplane Vol: 28.0 ml 14.65 ml/m  AORTIC VALVE AV Area (Vmax):    2.91 cm AV Area (Vmean):   2.84 cm AV Area (VTI):     2.65 cm AV Vmax:           143.00 cm/s AV Vmean:          97.700 cm/s AV VTI:            0.282 m AV Peak Grad:      8.2 mmHg AV Mean Grad:      4.0 mmHg LVOT Vmax:         132.33 cm/s LVOT Vmean:        88.333 cm/s LVOT VTI:          0.238 m LVOT/AV VTI ratio: 0.84  AORTA Ao Root diam: 3.00 cm Ao Asc diam:  2.70 cm MITRAL VALVE MV Area (PHT): 4.15 cm     SHUNTS MV Decel Time: 183 msec     Systemic VTI:  0.24 m MV E velocity: 92.00 cm/s   Systemic Diam: 2.00 cm MV A velocity: 106.00 cm/s MV E/A ratio:  0.87 Jenkins Rouge MD Electronically signed by Jenkins Rouge MD Signature Date/Time: 08/23/2022/11:08:48 AM    Final    MR BRAIN WO  CONTRAST  Result Date: 08/22/2022 CLINICAL DATA:  Stroke, follow-up EXAM: MRI HEAD WITHOUT CONTRAST TECHNIQUE: Multiplanar, multiecho pulse sequences of the brain and surrounding structures were obtained without intravenous contrast. COMPARISON:  05/29/2019 MRI head, correlation is also made with CT head 08/22/2022 FINDINGS: Brain: No restricted diffusion to suggest acute or subacute infarct. No acute hemorrhage, mass, mass effect, or midline shift. No hydrocephalus or extra-axial collection. Normal pituitary and craniocervical junction. No hemosiderin deposition to suggest remote hemorrhage. Scattered T2 hyperintense signal in the periventricular white matter, likely the sequela of mild  chronic small vessel ischemic disease. Vascular: Normal arterial flow voids. Skull and upper cervical spine: Normal marrow signal. Sinuses/Orbits: Mucosal thickening in the left maxillary sinus and bilateral ethmoid air cells. Status post bilateral lens replacements. Other: The mastoid air cells are well aerated. IMPRESSION: No acute intracranial process. No evidence of acute or subacute infarct. Electronically Signed   By: Merilyn Baba M.D.   On: 08/22/2022 22:12   CT HEAD WO CONTRAST (5MM)  Result Date: 08/22/2022 CLINICAL DATA:  Facial trauma, unwitnessed fall. EXAM: CT HEAD WITHOUT CONTRAST CT CERVICAL SPINE WITHOUT CONTRAST TECHNIQUE: Multidetector CT imaging of the head and cervical spine was performed following the standard protocol without intravenous contrast. Multiplanar CT image reconstructions of the cervical spine were also generated. RADIATION DOSE REDUCTION: This exam was performed according to the departmental dose-optimization program which includes automated exposure control, adjustment of the mA and/or kV according to patient size and/or use of iterative reconstruction technique. COMPARISON:  None Available. FINDINGS: CT HEAD FINDINGS Brain: No evidence of acute infarction, hemorrhage, hydrocephalus, extra-axial collection or mass lesion/mass effect. Vascular: No hyperdense vessel or unexpected calcification. Skull: Normal. Negative for fracture or focal lesion. Sinuses/Orbits: No evidence of injury CT CERVICAL SPINE FINDINGS Alignment: Normal. Skull base and vertebrae: No acute fracture. No primary bone lesion or focal pathologic process. Soft tissues and spinal canal: No prevertebral fluid or swelling. No visible canal hematoma. Disc levels:  Ordinary and generalized cervical spine degeneration Upper chest: No evidence of injury IMPRESSION: No evidence of intracranial or cervical spine injury. Electronically Signed   By: Jorje Guild M.D.   On: 08/22/2022 05:03   CT Cervical Spine Wo  Contrast  Result Date: 08/22/2022 CLINICAL DATA:  Facial trauma, unwitnessed fall. EXAM: CT HEAD WITHOUT CONTRAST CT CERVICAL SPINE WITHOUT CONTRAST TECHNIQUE: Multidetector CT imaging of the head and cervical spine was performed following the standard protocol without intravenous contrast. Multiplanar CT image reconstructions of the cervical spine were also generated. RADIATION DOSE REDUCTION: This exam was performed according to the departmental dose-optimization program which includes automated exposure control, adjustment of the mA and/or kV according to patient size and/or use of iterative reconstruction technique. COMPARISON:  None Available. FINDINGS: CT HEAD FINDINGS Brain: No evidence of acute infarction, hemorrhage, hydrocephalus, extra-axial collection or mass lesion/mass effect. Vascular: No hyperdense vessel or unexpected calcification. Skull: Normal. Negative for fracture or focal lesion. Sinuses/Orbits: No evidence of injury CT CERVICAL SPINE FINDINGS Alignment: Normal. Skull base and vertebrae: No acute fracture. No primary bone lesion or focal pathologic process. Soft tissues and spinal canal: No prevertebral fluid or swelling. No visible canal hematoma. Disc levels:  Ordinary and generalized cervical spine degeneration Upper chest: No evidence of injury IMPRESSION: No evidence of intracranial or cervical spine injury. Electronically Signed   By: Jorje Guild M.D.   On: 08/22/2022 05:03   DG Chest 1 View  Result Date: 08/22/2022 CLINICAL DATA:  fall EXAM: CHEST  1 VIEW  COMPARISON:  Chest x-ray 05/29/2019 FINDINGS: The heart and mediastinal contours are within normal limits. No focal consolidation. No pulmonary edema. No pleural effusion. No pneumothorax. No acute osseous abnormality. IMPRESSION: No active disease. Electronically Signed   By: Iven Finn M.D.   On: 08/22/2022 03:06   DG Ankle Complete Right  Result Date: 08/22/2022 CLINICAL DATA:  Fall, ankle pain. EXAM: RIGHT ANKLE  - COMPLETE 3+ VIEW COMPARISON:  None Available. FINDINGS: There is no evidence of fracture, dislocation, or joint effusion. Mild degenerative changes are noted at the medial malleolus. Soft tissue swelling is present about the ankle. IMPRESSION: No acute fracture or dislocation. Electronically Signed   By: Brett Fairy M.D.   On: 08/22/2022 03:04     Labs:   Basic Metabolic Panel: Recent Labs  Lab 08/22/22 0321 08/22/22 0358 08/23/22 0343 08/24/22 0455  NA 134* 136 136 136  K 4.4 4.3 3.8 3.7  CL 99  --  101 104  CO2 21*  --  22 24  GLUCOSE 337*  --  284* 205*  BUN 13  --  12 10  CREATININE 1.00  --  0.86 0.72  CALCIUM 9.0  --  8.6* 8.0*  MG  --   --   --  1.9  PHOS  --   --   --  2.3*   GFR Estimated Creatinine Clearance: 69.5 mL/min (by C-G formula based on SCr of 0.72 mg/dL). Liver Function Tests: Recent Labs  Lab 08/22/22 0321 08/24/22 0455  AST 39  --   ALT 30  --   ALKPHOS 84  --   BILITOT 1.0  --   PROT 7.4  --   ALBUMIN 3.5 2.7*   No results for input(s): "LIPASE", "AMYLASE" in the last 168 hours. No results for input(s): "AMMONIA" in the last 168 hours. Coagulation profile No results for input(s): "INR", "PROTIME" in the last 168 hours.  CBC: Recent Labs  Lab 08/22/22 0321 08/22/22 0358 08/23/22 0343 08/24/22 0455  WBC 14.2*  --  9.2 8.6  NEUTROABS 11.2*  --   --  5.0  HGB 13.1 14.6 11.2* 10.4*  HCT 43.9 43.0 36.9 34.7*  MCV 78.5*  --  77.0* 77.8*  PLT 245  --  236 215   Cardiac Enzymes: Recent Labs  Lab 08/22/22 0321  CKTOTAL 76   BNP: Invalid input(s): "POCBNP" CBG: Recent Labs  Lab 08/23/22 1156 08/23/22 1759 08/23/22 2117 08/24/22 0716 08/24/22 1120  GLUCAP 284* 231* 166* 207* 195*   D-Dimer No results for input(s): "DDIMER" in the last 72 hours. Hgb A1c Recent Labs    08/22/22 0321  HGBA1C 9.8*   Lipid Profile No results for input(s): "CHOL", "HDL", "LDLCALC", "TRIG", "CHOLHDL", "LDLDIRECT" in the last 72  hours. Thyroid function studies No results for input(s): "TSH", "T4TOTAL", "T3FREE", "THYROIDAB" in the last 72 hours.  Invalid input(s): "FREET3" Anemia work up No results for input(s): "VITAMINB12", "FOLATE", "FERRITIN", "TIBC", "IRON", "RETICCTPCT" in the last 72 hours. Microbiology No results found for this or any previous visit (from the past 240 hour(s)).  Time coordinating discharge: 35 minutes  Signed: Jamille Yoshino  [facility] 08/24/2022, 12:55 PM

## 2022-08-24 NOTE — Progress Notes (Signed)
Mobility Specialist Progress Note    08/24/22 0913  Mobility  Activity Transferred to/from [facility];Ambulated with assistance in hallway  Level of Assistance Contact guard assist, steadying assist  Assistive Device Front wheel walker  Distance Ambulated (ft) 230 ft  Activity Response Tolerated well  Mobility Referral Yes  $Mobility charge 1 Mobility   Pre-Mobility: 85 HR During Mobility: 121 HR Post-Mobility: 96 HR, 114/58 (74) BP  Pt received in bed and agreeable. Had void on BSC. C/o fatigue on walk. Returned to bed with call bell in reach.   Hildred Alamin Mobility Specialist  Please Psychologist, sport and exercise or Rehab Office at [phone]

## 2022-08-24 NOTE — Inpatient Diabetes Management (Addendum)
Inpatient Diabetes Program Recommendations  AACE/ADA: New Consensus Statement on Inpatient Glycemic Control (2015)  Target Ranges:  Prepandial:   less than 140 mg/dL      Peak postprandial:   less than 180 mg/dL (1-2 hours)      Critically ill patients:  140 - 180 mg/dL   Lab Results  Component Value Date   GLUCAP 195 (H) 08/24/2022   HGBA1C 9.8 (H) 08/22/2022    Review of Glycemic Control  Latest Reference Range & Units 08/23/22 07:20 08/23/22 11:56 08/23/22 17:59 08/23/22 21:17 08/24/22 07:16 08/24/22 11:20  Glucose-Capillary 70 - 99 mg/dL 261 (H) 284 (H) 231 (H) 166 (H) 207 (H) 195 (H)  (H): Data is abnormally high  Diabetes history: DM2 Outpatient Diabetes medications: Levemir 60 units QHS, Novolog 16 units TID Current orders for Inpatient glycemic control: Levemir 50 units QHS, Novolog 0-20 units TID and 0-5 units QHS  Inpatient Diabetes Program Recommendations:    Levemir 54 units QHS  Addendum@14$ :14:  Spoke with patient at bedside.  She cannot recall her insulins or doses.  She states her boyfriend dials in her units and she administers insulin in her abdomen.  She states she rotates sites and does not skip doses.  She does not drink caloric beverages.  Ordered LWWD booklet.    Reviewed patient's current A1c of 9.8% (average BG of 235 mg/dL). Explained what a A1c is and what it measures. Also reviewed goal A1c with patient, importance of good glucose control @ home, and blood sugar goals.  Educated on The Plate Method, CHO's, portion control, CBGs at home fasting and mid afternoon, F/U with PCP every 3 months, bring meter to PCP office, long and short term complications of uncontrolled BG, and importance of exercise.  Will continue to follow while inpatient.  Thank you, Reche Dixon, MSN, [place] Diabetes Coordinator Inpatient Diabetes Program [phone] (team pager from 8a-5p)

## 2022-08-24 NOTE — TOC Transition Note (Signed)
Transition of Care [facility]) - CM/SW Discharge Note   Patient Details  Name: Kelly Richardson MRN: [id number] Date of Birth: 01-15-1951  Transition of Care [facility]) CM/SW Contact:  Ninfa Meeker, RN Phone Number: 08/24/2022, 1:34 PM   Clinical Narrative:    Case manager spoke with patient concerning discharge recommendation for Home Health therapy. Patient stats that she will not allow anyone into her home, but she is willing to go to outpatient therapy, requests someplace close to her home, discussed sites and she agreed with referral being sent to [facility] outpatient. CM confirmed that MD is in agreement and will order, referral was sent for outpatient therapy.  Final next level of care: Home/Self Care Barriers to Discharge: No Barriers Identified   Patient Goals and CMS Choice   Choice offered to / list presented to : Patient  Discharge Placement                         Discharge Plan and Services Additional resources added to the After Visit Summary for     Discharge Planning Services: CM Consult, Other - See comment (referral made to [facility]) Post Acute Care Choice: IP Rehab (outpatient therapy)            DME Agency: [facility] Arranged: Patient Refused [place] Agency: NA        Social Determinants of Health (SDOH) Interventions SDOH Screenings   Food Insecurity: No Food Insecurity (08/22/2022)  Housing: Low Risk  (08/22/2022)  Transportation Needs: No Transportation Needs (08/22/2022)  Utilities: Not At Risk (08/22/2022)  Tobacco Use: Low Risk  (08/22/2022)     Readmission Risk Interventions     No data to display
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
## 2022-09-11 ENCOUNTER — Encounter (HOSPITAL_COMMUNITY): Payer: Self-pay

## 2022-09-11 ENCOUNTER — Emergency Department (HOSPITAL_COMMUNITY)
Admission: EM | Admit: 2022-09-11 | Discharge: 2022-09-11 | Disposition: A | Discharge status: Home / Self Care | Payer: Medicare Other | Attending: Emergency Medicine | Admitting: Emergency Medicine

## 2022-09-11 ENCOUNTER — Other Ambulatory Visit: Payer: Self-pay

## 2022-09-11 DIAGNOSIS — K029 Dental caries, unspecified: Secondary | ICD-10-CM | POA: Diagnosis not present

## 2022-09-11 DIAGNOSIS — Z794 Long term (current) use of insulin: Secondary | ICD-10-CM | POA: Insufficient documentation

## 2022-09-11 DIAGNOSIS — Z91040 Latex allergy status: Secondary | ICD-10-CM | POA: Diagnosis not present

## 2022-09-11 DIAGNOSIS — K0889 Other specified disorders of teeth and supporting structures: Secondary | ICD-10-CM | POA: Diagnosis present

## 2022-09-11 MED ORDER — CLINDAMYCIN HCL 300 MG PO CAPS: 300.0000 mg | ORAL_CAPSULE | Freq: Three times a day (TID) | ORAL | 0 refills | Status: AC

## 2022-09-11 NOTE — ED Provider Notes (Signed)
[facility] Provider Note   CSN: [id number] Arrival date & time: 09/11/22  2025     History  Chief Complaint  Patient presents with   Dental Pain    Kelly Richardson is a 72 y.o. female.  Pt complains of left lower dental pain, chipped tooth, facial pain. No trauma, fever, drainage.        Home Medications Prior to Admission medications   Medication Sig Start Date End Date Taking? Authorizing Provider  clindamycin (CLEOCIN) 300 MG capsule Take 1 capsule (300 mg total) by mouth 3 (three) times daily for 7 days. 09/11/22 09/18/22 Yes Tacy Learn, PA-C  ACCU-CHEK AVIVA PLUS test strip 1 each by Other route daily. 05/02/19   [provider]  acetaminophen (TYLENOL) 325 MG tablet Take 650 mg by mouth every 6 (six) hours as needed for mild pain.    [provider]  insulin aspart (NOVOLOG FLEXPEN) 100 UNIT/ML FlexPen Inject 16 Units as directed 3 (three) times daily with meals. Per sliding scale and not to exceed 81 units daily. 04/05/19   [provider]  Insulin Detemir (LEVEMIR FLEXTOUCH) 100 UNIT/ML Pen Inject 40 Units into the skin at bedtime. Patient taking differently: Inject 60 Units into the skin at bedtime. 12/09/16   Sherwood Gambler, MD  Insulin Pen Needle (FIFTY50 PEN NEEDLES) 31G X 8 MM MISC 1 each by Other route daily. 02/15/18   [provider]  lisinopril (PRINIVIL,ZESTRIL) 2.5 MG tablet Take 2.5 mg by mouth every evening.  Patient not taking: Reported on 08/22/2022 06/24/17   [provider]      Allergies    Metformin, Penicillins, Seldane [terfenadine], Sulfonamide derivatives, Vicks formula 44 cough relief [dextromethorphan hbr], Xanax [alprazolam], Iohexol, Latex, Other, and Percocet [oxycodone-acetaminophen]    Review of Systems   Review of Systems Negative except as per HPI Physical Exam Updated Vital Signs BP (!) 145/100   Pulse (!) 118   Temp 98.1 F (36.7 C) (Oral)    Resp 16   SpO2 97%  Physical Exam Vitals and nursing note reviewed.  Constitutional:      General: She is not in acute distress.    Appearance: She is well-developed. She is not diaphoretic.  HENT:     Head: Normocephalic and atraumatic.     Jaw: No trismus or swelling.     Mouth/Throat:     Mouth: Mucous membranes are moist.      Comments: Left lower tooth with decay to the gumline, no obvious abscess, no submandibular swelling, no trismus  Eyes:     Conjunctiva/sclera: Conjunctivae normal.  Pulmonary:     Effort: Pulmonary effort is normal.  Musculoskeletal:     Cervical back: Neck supple.  Lymphadenopathy:     Cervical: No cervical adenopathy.  Skin:    General: Skin is warm and dry.     Findings: No erythema or rash.  Neurological:     Mental Status: She is alert and oriented to person, place, and time.  Psychiatric:        Behavior: Behavior normal.     ED Results / Procedures / Treatments   Labs (all labs ordered are listed, but only abnormal results are displayed) Labs Reviewed - No data to display  EKG None  Radiology No results found.  Procedures Procedures    Medications Ordered in ED Medications - No data to display  ED Course/ Medical Decision Making/ A&P  Medical Decision Making Risk Prescription drug management.   72 year old female presents with complaint of left lower dental pain, notes that she has a broken tooth, unsure when this happened.  She denies trauma, fever, drainage.  Found to have left lower tooth with severe decay, additional surrounding dental caries.  No submandibular fullness or swelling, no cervical lymph nodes palpated.  No trismus.  Plan is to treat with antibiotics, recommend Motrin and Tylenol.  Most recent chemistry on file with normal renal function.  Is referred to dentist for follow-up.        Final Clinical Impression(s) / ED Diagnoses Final diagnoses:  Dental decay    Rx /  DC Orders ED Discharge Orders          Ordered    clindamycin (CLEOCIN) 300 MG capsule  3 times daily        09/11/22 2042              Tacy Learn, PA-C 09/11/22 2046    Lorelle Gibbs, DO 09/12/22 1530

## 2022-09-11 NOTE — ED Triage Notes (Signed)
Pt reports left bottom tooth ache that started today.

## 2022-09-11 NOTE — Discharge Instructions (Addendum)
Take antibiotic as prescribed and complete the full course. Follow-up with a dentist as soon as possible, see resource list as well as references for today. Rinse with Listerine after every meal.  Take 650 mg of Tylenol with 600 mg Advil Liqui-Gels every 6 hours for pain.

## 2022-09-12 ENCOUNTER — Encounter (HOSPITAL_COMMUNITY): Payer: Self-pay

## 2022-09-12 ENCOUNTER — Other Ambulatory Visit: Payer: Self-pay

## 2022-09-12 ENCOUNTER — Emergency Department (HOSPITAL_COMMUNITY)
Admission: EM | Admit: 2022-09-12 | Discharge: 2022-09-12 | Disposition: A | Discharge status: Home / Self Care | Payer: Medicare Other | Attending: Emergency Medicine | Admitting: Emergency Medicine

## 2022-09-12 ENCOUNTER — Emergency Department (HOSPITAL_COMMUNITY): Payer: Medicare Other

## 2022-09-12 DIAGNOSIS — E860 Dehydration: Secondary | ICD-10-CM | POA: Diagnosis not present

## 2022-09-12 DIAGNOSIS — Z20822 Contact with and (suspected) exposure to covid-19: Secondary | ICD-10-CM | POA: Diagnosis not present

## 2022-09-12 DIAGNOSIS — R059 Cough, unspecified: Secondary | ICD-10-CM | POA: Insufficient documentation

## 2022-09-12 DIAGNOSIS — E1165 Type 2 diabetes mellitus with hyperglycemia: Secondary | ICD-10-CM | POA: Insufficient documentation

## 2022-09-12 DIAGNOSIS — R739 Hyperglycemia, unspecified: Secondary | ICD-10-CM

## 2022-09-12 DIAGNOSIS — Z91040 Latex allergy status: Secondary | ICD-10-CM | POA: Diagnosis not present

## 2022-09-12 DIAGNOSIS — R0602 Shortness of breath: Secondary | ICD-10-CM | POA: Diagnosis not present

## 2022-09-12 DIAGNOSIS — Z794 Long term (current) use of insulin: Secondary | ICD-10-CM | POA: Diagnosis not present

## 2022-09-12 LAB — RESP PANEL BY RT-PCR (RSV, FLU A&B, COVID)  RVPGX2
Influenza A by PCR: NEGATIVE
Influenza B by PCR: NEGATIVE
Resp Syncytial Virus by PCR: NEGATIVE
SARS Coronavirus 2 by RT PCR: NEGATIVE

## 2022-09-12 LAB — CBC WITH DIFFERENTIAL/PLATELET
Abs Immature Granulocytes: 0.03 10*3/uL (ref 0.00–0.07)
Basophils Absolute: 0 10*3/uL (ref 0.0–0.1)
Basophils Relative: 0 %
Eosinophils Absolute: 0.3 10*3/uL (ref 0.0–0.5)
Eosinophils Relative: 3 %
HCT: 43.3 % (ref 36.0–46.0)
Hemoglobin: 12.9 g/dL (ref 12.0–15.0)
Immature Granulocytes: 0 %
Lymphocytes Relative: 32 %
Lymphs Abs: 2.4 10*3/uL (ref 0.7–4.0)
MCH: 23.5 pg — ABNORMAL LOW (ref 26.0–34.0)
MCHC: 29.8 g/dL — ABNORMAL LOW (ref 30.0–36.0)
MCV: 78.9 fL — ABNORMAL LOW (ref 80.0–100.0)
Monocytes Absolute: 0.6 10*3/uL (ref 0.1–1.0)
Monocytes Relative: 7 %
Neutro Abs: 4.4 10*3/uL (ref 1.7–7.7)
Neutrophils Relative %: 58 %
Platelets: 297 10*3/uL (ref 150–400)
RBC: 5.49 MIL/uL — ABNORMAL HIGH (ref 3.87–5.11)
RDW: 17.3 % — ABNORMAL HIGH (ref 11.5–15.5)
WBC: 7.7 10*3/uL (ref 4.0–10.5)
nRBC: 0 % (ref 0.0–0.2)

## 2022-09-12 LAB — BASIC METABOLIC PANEL
Anion gap: 11 (ref 5–15)
BUN: 15 mg/dL (ref 8–23)
CO2: 22 mmol/L (ref 22–32)
Calcium: 9.1 mg/dL (ref 8.9–10.3)
Chloride: 101 mmol/L (ref 98–111)
Creatinine, Ser: 0.76 mg/dL (ref 0.44–1.00)
GFR, Estimated: >60 mL/min (ref >60)
Glucose, Bld: 332 mg/dL — ABNORMAL HIGH (ref 70–99)
Potassium: 4.1 mmol/L (ref 3.5–5.1)
Sodium: 134 mmol/L — ABNORMAL LOW (ref 135–145)

## 2022-09-12 LAB — D-DIMER, QUANTITATIVE: D-Dimer, Quant: 0.7 ug/mL-FEU — ABNORMAL HIGH (ref 0.00–0.50)

## 2022-09-12 LAB — TROPONIN I (HIGH SENSITIVITY)
Troponin I (High Sensitivity): 3 ng/L (ref <18)
Troponin I (High Sensitivity): 3 ng/L (ref <18)

## 2022-09-12 LAB — BRAIN NATRIURETIC PEPTIDE: B Natriuretic Peptide: 30.1 pg/mL (ref 0.0–100.0)

## 2022-09-12 MED ORDER — IOHEXOL 350 MG/ML SOLN
75.0000 mL | Freq: Once | INTRAVENOUS | Status: AC | PRN
  Administered 2022-09-12: 75 mL via INTRAVENOUS

## 2022-09-12 MED ORDER — DIPHENHYDRAMINE HCL 50 MG/ML IJ SOLN
50.0000 mg | Freq: Once | INTRAMUSCULAR | Status: AC
  Administered 2022-09-12: 50 mg via INTRAVENOUS
  Filled 2022-09-12: qty 1

## 2022-09-12 MED ORDER — DIPHENHYDRAMINE HCL 25 MG PO CAPS: 50.0000 mg | ORAL_CAPSULE | Freq: Once | ORAL | Status: AC

## 2022-09-12 MED ORDER — METHYLPREDNISOLONE SODIUM SUCC 40 MG IJ SOLR
40.0000 mg | Freq: Once | INTRAMUSCULAR | Status: AC
  Administered 2022-09-12: 40 mg via INTRAVENOUS
  Filled 2022-09-12: qty 1

## 2022-09-12 NOTE — ED Provider Notes (Signed)
[facility] Provider Note   CSN: [id number] Arrival date & time: 09/12/22  [id number]     History  Chief Complaint  Patient presents with   Shortness of Breath    Kelly Richardson is a 72 y.o. female with history of type 2 diabetes on insulin, presenting to the emergency department with shortness of breath.  The patient reports that she woke up this morning feeling short of breath.  She denies chest pressure comfort, reports she has a mild persistent cough.  She says she feels short of breath with any activity and with sitting upright.  She denies history of COPD, smoking, asthma, or denies any symptoms in the past.  She was just seen for dental pain in the emergency department yesterday.  Record review shows the patient was briefly hospitalized 1 month ago in February, for an unwitnessed fall.  She was noted to be hyperglycemic at that time with poorly controlled diabetes.  She appeared dehydrated.  She had an MRI of the brain which was unremarkable while inpatient.  HPI     Home Medications Prior to Admission medications   Medication Sig Start Date End Date Taking? Authorizing Provider  ACCU-CHEK AVIVA PLUS test strip 1 each by Other route daily. 05/02/19   [provider]  acetaminophen (TYLENOL) 325 MG tablet Take 650 mg by mouth every 6 (six) hours as needed for mild pain.    [provider]  clindamycin (CLEOCIN) 300 MG capsule Take 1 capsule (300 mg total) by mouth 3 (three) times daily for 7 days. 09/11/22 09/18/22  Suella Broad A, PA-C  insulin aspart (NOVOLOG FLEXPEN) 100 UNIT/ML FlexPen Inject 16 Units as directed 3 (three) times daily with meals. Per sliding scale and not to exceed 81 units daily. 04/05/19   [provider]  Insulin Detemir (LEVEMIR FLEXTOUCH) 100 UNIT/ML Pen Inject 40 Units into the skin at bedtime. Patient taking differently: Inject 60 Units into the skin at bedtime. 12/09/16   Sherwood Gambler, MD  Insulin Pen Needle (FIFTY50 PEN NEEDLES) 31G X 8 MM MISC 1 each by Other route daily. 02/15/18   [provider]  lisinopril (PRINIVIL,ZESTRIL) 2.5 MG tablet Take 2.5 mg by mouth every evening.  Patient not taking: Reported on 08/22/2022 06/24/17   [provider]      Allergies    Metformin, Penicillins, Seldane [terfenadine], Sulfonamide derivatives, Vicks formula 44 cough relief [dextromethorphan hbr], Xanax [alprazolam], Iohexol, Latex, Other, and Percocet [oxycodone-acetaminophen]    Review of Systems   Review of Systems  Physical Exam Updated Vital Signs BP 131/67   Pulse 88   Temp 98 F (36.7 C) (Oral)   Resp 16   Ht '5\' 2"'$  (1.575 m)   Wt 90.7 kg   SpO2 94%   BMI 36.58 kg/m  Physical Exam Constitutional:      General: She is not in acute distress. HENT:     Head: Normocephalic and atraumatic.  Eyes:     Conjunctiva/sclera: Conjunctivae normal.     Pupils: Pupils are equal, round, and reactive to light.  Cardiovascular:     Rate and Rhythm: Normal rate and regular rhythm.  Pulmonary:     Effort: Pulmonary effort is normal. No respiratory distress.     Comments: 95% on room air Abdominal:     General: There is no distension.     Tenderness: There is no abdominal tenderness.  Skin:    General: Skin is warm and dry.  Neurological:     General: No focal deficit present.     Mental Status: She is alert. Mental status is at baseline.  Psychiatric:        Mood and Affect: Mood normal.        Behavior: Behavior normal.     ED Results / Procedures / Treatments   Labs (all labs ordered are listed, but only abnormal results are displayed) Labs Reviewed  BASIC METABOLIC PANEL - Abnormal; Notable for the following components:      Result Value   Sodium 134 (*)    Glucose, Bld 332 (*)    All other components within normal limits  CBC WITH DIFFERENTIAL/PLATELET - Abnormal; Notable for the following components:   RBC 5.49 (*)    MCV 78.9  (*)    MCH 23.5 (*)    MCHC 29.8 (*)    RDW 17.3 (*)    All other components within normal limits  D-DIMER, QUANTITATIVE - Abnormal; Notable for the following components:   D-Dimer, Quant 0.70 (*)    All other components within normal limits  RESP PANEL BY RT-PCR (RSV, FLU A&B, COVID)  RVPGX2  BRAIN NATRIURETIC PEPTIDE  TROPONIN I (HIGH SENSITIVITY)  TROPONIN I (HIGH SENSITIVITY)    EKG EKG Interpretation  Date/Time:  Saturday September 12 2022 08:19:09 EST Ventricular Rate:  99 PR Interval:  164 QRS Duration: 103 QT Interval:  357 QTC Calculation: 459 R Axis:   55 Text Interpretation: Sinus rhythm Low voltage, precordial leads Confirmed by Octaviano Glow [phone]) on 09/12/2022 8:42:28 AM  Radiology CT Angio Chest PE W and/or Wo Contrast  Result Date: 09/12/2022 CLINICAL DATA:  Evaluate for pulmonary embolus. EXAM: CT ANGIOGRAPHY CHEST WITH CONTRAST TECHNIQUE: Multidetector CT imaging of the chest was performed using the standard protocol during bolus administration of intravenous contrast. Multiplanar CT image reconstructions and MIPs were obtained to evaluate the vascular anatomy. RADIATION DOSE REDUCTION: This exam was performed according to the departmental dose-optimization program which includes automated exposure control, adjustment of the mA and/or kV according to patient size and/or use of iterative reconstruction technique. CONTRAST:  36m OMNIPAQUE IOHEXOL 350 MG/ML SOLN COMPARISON:  Chest radiograph earlier same day; CT chest 11/21/2009 FINDINGS: Cardiovascular: Normal heart size. Dilated main pulmonary artery. Adequate opacification of the pulmonary arterial system. No intraluminal filling defects identified to suggest acute pulmonary embolus. Motion artifact limits evaluation of the distal segmental and subsegmental pulmonary arteries. Mediastinum/Nodes: There is tortuosity of the esophagus. Small hiatal hernia. Additionally, there appears to be thickening of the distal esophagus  near the GE junction. Lungs/Pleura: Expiratory phase imaging. Atelectasis within the lower lobes bilaterally. No large area pulmonary consolidation. No pleural effusion or pneumothorax. Upper Abdomen: Prior cholecystectomy. Spleen is lobular in contour. No acute process. 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 Musculoskeletal: Thoracic spine degenerative changes. No aggressive or acute appearing osseous lesions. Review of the MIP images confirms the above findings. IMPRESSION: 1. No evidence for acute pulmonary embolus. 2. Dilated main pulmonary artery as can be seen with pulmonary arterial hypertension. 3. Small hiatal hernia. Additionally, there appears to be thickening of the distal esophagus near the GE junction. Consider further evaluation with upper endoscopy as clinically warranted, in the nonemergent setting. 4. 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 Consider follow-up imaging in 12 months to reassess. Electronically Signed   By: DLovey NewcomerM.D.   On: 09/12/2022 14:58   DG Chest 2 View  Result Date: 09/12/2022 CLINICAL DATA:  Shortness of breath upon awakening this morning EXAM: CHEST - 2 VIEW COMPARISON:  None Available. FINDINGS: Frontal and lateral views of the chest demonstrate an unremarkable cardiac silhouette. No airspace disease, effusion, or pneumothorax. No acute bony abnormalities. IMPRESSION: 1. No acute intrathoracic process. Electronically Signed   By: Randa Ngo M.D.   On: 09/12/2022 09:23    Procedures Procedures    Medications Ordered in ED Medications  methylPREDNISolone sodium succinate (SOLU-MEDROL) 40 mg/mL injection 40 mg (40 mg Intravenous Given 09/12/22 1057)  diphenhydrAMINE (BENADRYL) capsule 50 mg ( Oral See Alternative 09/12/22 1411)    Or  diphenhydrAMINE (BENADRYL) injection 50 mg (50 mg Intravenous Given 09/12/22 1411)  iohexol (OMNIPAQUE) 350 MG/ML injection 75 mL (75 mLs Intravenous  Contrast Given 09/12/22 1425)    ED Course/ Medical Decision Making/ A&P                             Medical Decision Making Amount and/or Complexity of Data Reviewed Labs: ordered. Radiology: ordered.  Risk Prescription drug management.   This patient presents to the ED with concern for shortness of breath. This involves an extensive number of treatment options, and is a complaint that carries with it a high risk of complications and morbidity.  The differential diagnosis includes anemia versus pneumonia versus pleural effusion versus viral URI versus pulmonary embolism versus other  Additional history obtained from EMS  I ordered and personally interpreted labs.  The pertinent results include: D-dimer elevated to 0.7.  Remaining labs at baseline level.  No leukocytosis or anemia.  Patient has some mild hyperglycemia, but no evidence of DKA, no elevated anion gap.  BNP and troponin unremarkable.  COVID and flu are negative  I ordered imaging studies including x-ray of the chest, CT PE study I independently visualized and interpreted imaging which showed no acute findings explain the patient's symptoms; hiatal hernia I agree with the radiologist interpretation  The patient was maintained on a cardiac monitor.  I personally viewed and interpreted the cardiac monitored which showed an underlying rhythm of: Normal sinus rhythm  Per my interpretation the patient's ECG shows no acute findings  I ordered medication including steroid and Benadryl for prep for potential iodine contrast allergies (reported as nausea after receiving iodine in the past, no anaphylaxis)  I have reviewed the patients home medicines and have made adjustments as needed  After the interventions noted above, I reevaluated the patient and found that they have: improved  No hypoxia after 7 hours in Ed, no worsening resp symptoms.  She appears comfortable on exam. Stable for discharge  Dispostion:  After  consideration of the diagnostic results and the patients response to treatment, I feel that the patent would benefit from close outpatient follow-up.  Patient advised to maintain her insulin and diabetes regimen at home for her blood sugars.  I do not see evidence of infection, nor COPD exacerbation on exam.         Final Clinical Impression(s) / ED Diagnoses Final diagnoses:  Shortness of breath  Hyperglycemia    Rx / DC Orders ED Discharge Orders     None         Wyvonnia Dusky, MD 09/12/22 [id number]

## 2022-09-12 NOTE — ED Triage Notes (Signed)
Pt arrived via EMS from home with complaints of SOB. She was at [facility] yesterday for a toothache.   150/100 HR 104 O2 97  CBG 286

## 2024-06-24 ENCOUNTER — Other Ambulatory Visit: Payer: Self-pay

## 2024-06-24 ENCOUNTER — Emergency Department (HOSPITAL_COMMUNITY)
Admission: EM | Admit: 2024-06-24 | Discharge: 2024-06-25 | Disposition: A | Attending: Emergency Medicine | Admitting: Emergency Medicine

## 2024-06-24 ENCOUNTER — Emergency Department (HOSPITAL_COMMUNITY)

## 2024-06-24 ENCOUNTER — Encounter (HOSPITAL_COMMUNITY): Payer: Self-pay

## 2024-06-24 DIAGNOSIS — I10 Essential (primary) hypertension: Secondary | ICD-10-CM | POA: Insufficient documentation

## 2024-06-24 DIAGNOSIS — R109 Unspecified abdominal pain: Secondary | ICD-10-CM | POA: Diagnosis present

## 2024-06-24 DIAGNOSIS — R059 Cough, unspecified: Secondary | ICD-10-CM | POA: Diagnosis present

## 2024-06-24 DIAGNOSIS — J209 Acute bronchitis, unspecified: Secondary | ICD-10-CM | POA: Insufficient documentation

## 2024-06-24 DIAGNOSIS — K449 Diaphragmatic hernia without obstruction or gangrene: Secondary | ICD-10-CM | POA: Diagnosis not present

## 2024-06-24 DIAGNOSIS — Z794 Long term (current) use of insulin: Secondary | ICD-10-CM | POA: Diagnosis not present

## 2024-06-24 DIAGNOSIS — R197 Diarrhea, unspecified: Secondary | ICD-10-CM | POA: Insufficient documentation

## 2024-06-24 DIAGNOSIS — I700 Atherosclerosis of aorta: Secondary | ICD-10-CM | POA: Diagnosis not present

## 2024-06-24 DIAGNOSIS — E119 Type 2 diabetes mellitus without complications: Secondary | ICD-10-CM | POA: Diagnosis not present

## 2024-06-24 DIAGNOSIS — R062 Wheezing: Secondary | ICD-10-CM | POA: Diagnosis present

## 2024-06-24 DIAGNOSIS — J40 Bronchitis, not specified as acute or chronic: Secondary | ICD-10-CM

## 2024-06-24 LAB — RESP PANEL BY RT-PCR (RSV, FLU A&B, COVID)  RVPGX2
Influenza A by PCR: NEGATIVE
Influenza B by PCR: NEGATIVE
Resp Syncytial Virus by PCR: NEGATIVE
SARS Coronavirus 2 by RT PCR: NEGATIVE

## 2024-06-24 LAB — COMPREHENSIVE METABOLIC PANEL WITH GFR
ALT: 37 U/L (ref 0–44)
AST: 34 U/L (ref 15–41)
Albumin: 4.6 g/dL (ref 3.5–5.0)
Alkaline Phosphatase: 101 U/L (ref 38–126)
Anion gap: 15 (ref 5–15)
BUN: 19 mg/dL (ref 8–23)
CO2: 24 mmol/L (ref 22–32)
Calcium: 10.3 mg/dL (ref 8.9–10.3)
Chloride: 101 mmol/L (ref 98–111)
Creatinine, Ser: 0.9 mg/dL (ref 0.44–1.00)
GFR, Estimated: >60 mL/min
Glucose, Bld: 189 mg/dL — ABNORMAL HIGH (ref 70–99)
Potassium: 4.5 mmol/L (ref 3.5–5.1)
Sodium: 139 mmol/L (ref 135–145)
Total Bilirubin: 0.6 mg/dL (ref 0.0–1.2)
Total Protein: 8.7 g/dL — ABNORMAL HIGH (ref 6.5–8.1)

## 2024-06-24 LAB — CBC
HCT: 50.4 % — ABNORMAL HIGH (ref 36.0–46.0)
Hemoglobin: 15.8 g/dL — ABNORMAL HIGH (ref 12.0–15.0)
MCH: 25.9 pg — ABNORMAL LOW (ref 26.0–34.0)
MCHC: 31.3 g/dL (ref 30.0–36.0)
MCV: 82.6 fL (ref 80.0–100.0)
Platelets: 276 K/uL (ref 150–400)
RBC: 6.1 MIL/uL — ABNORMAL HIGH (ref 3.87–5.11)
RDW: 15.1 % (ref 11.5–15.5)
WBC: 10.5 K/uL (ref 4.0–10.5)
nRBC: 0 % (ref 0.0–0.2)

## 2024-06-24 LAB — LIPASE, BLOOD: Lipase: 43 U/L (ref 11–51)

## 2024-06-24 NOTE — ED Provider Triage Note (Signed)
 Emergency Medicine Provider Triage Evaluation Note  Kelly Richardson , a 73 y.o. female  was evaluated in triage.  Pt complains of diarrhea.  She reports persistent diarrhea since eating [employer].  She also complains of cough.  Review of Systems  Positive: Cough, diarrhea Negative: Fever  Physical Exam  BP 127/81   Pulse (!) 113   Temp 97.7 F (36.5 C)   Resp 18   Ht 5' 2 (1.575 m)   Wt 90 kg   SpO2 99%   BMI 36.29 kg/m  Gen:   Awake, no distress   Resp:  Normal effort  MSK:   Moves extremities without difficulty  Other:    Medical Decision Making  Medically screening exam initiated at 7:22 PM.  Appropriate orders placed.  Tylin Stradley Celani was informed that the remainder of the evaluation will be completed by another provider, this initial triage assessment does not replace that evaluation, and the importance of remaining in the ED until their evaluation is complete.     Laurice Maude BROCKS, MD 06/24/24 [phone]

## 2024-06-24 NOTE — ED Triage Notes (Signed)
 Patient bib EMS from home with complaints of a cough for the past couple months recently has become productive white clear phlegm. Also has had Diarrhea for 2 days with incontinence which started 30 min after she ate [employer].   Denies fever, chills, nausea and vomiting.

## 2024-06-25 DIAGNOSIS — J209 Acute bronchitis, unspecified: Secondary | ICD-10-CM | POA: Diagnosis not present

## 2024-06-25 LAB — PRO BRAIN NATRIURETIC PEPTIDE: Pro Brain Natriuretic Peptide: 51.7 pg/mL

## 2024-06-25 LAB — T4, FREE: Free T4: 1.06 ng/dL (ref 0.80–2.00)

## 2024-06-25 LAB — TSH: TSH: 3.32 u[IU]/mL (ref 0.350–4.500)

## 2024-06-25 MED ORDER — ALBUTEROL SULFATE HFA 108 (90 BASE) MCG/ACT IN AERS
2.0000 | INHALATION_SPRAY | Freq: Once | RESPIRATORY_TRACT | Status: AC
  Administered 2024-06-25: 2 via RESPIRATORY_TRACT
  Filled 2024-06-25: qty 6.7

## 2024-06-25 MED ORDER — SODIUM CHLORIDE 0.9 % IV BOLUS: 1000.0000 mL | Freq: Once | INTRAVENOUS | Status: DC

## 2024-06-25 MED ORDER — AEROCHAMBER PLUS FLO-VU LARGE MISC
1.0000 | Freq: Once | Status: AC
  Administered 2024-06-25: 1

## 2024-06-25 MED ORDER — SODIUM CHLORIDE 0.9 % IV BOLUS
500.0000 mL | Freq: Once | INTRAVENOUS | Status: AC
  Administered 2024-06-25: 500 mL via INTRAVENOUS

## 2024-06-25 NOTE — ED Notes (Signed)
 Pt educated she will need to f/u with her PCP to get recommendation for services and supplies pt feels she needs. Pt verbalized understanding.   Pt states she stays with her long time spouse of 30 years who works third shift. It can be hard for him to help at times.   Pt was provided inhaler with spacer, d/c paperwork, and black cellphone.

## 2024-06-25 NOTE — ED Notes (Signed)
 When night RN and NT attempted to have pt walk, pt stated she wasn't able to put weight down on her legs. This RN observed pt taking three solid shuffle steps to the right with minimal assistance.

## 2024-06-25 NOTE — ED Provider Notes (Signed)
 " [place] [facility] DE[facility]MENT AT [facility] Provider Note  CSN: [id number] Arrival date & time: 06/24/24 1857  Chief Complaint(s) No chief complaint on file.  History provided by patient. HPI & MDM Kelly Richardson is a 73 y.o. female .   Cough Cough characteristics:  Hacking Sputum characteristics:  White Severity:  Moderate Onset quality:  Gradual Duration: several weeks. Timing:  Intermittent Progression:  Waxing and waning Chronicity:  Recurrent Relieved by:  Nothing Worsened by:  Nothing Associated symptoms: no chest pain, no chills, no fever, no myalgias, no shortness of breath, no sinus congestion and no sore throat   Diarrhea Quality:  Semi-solid and copious Severity:  Moderate Onset quality:  Gradual Number of episodes:  3-6 per day Duration:  2 days Timing:  Intermittent Relieved by:  None tried Exacerbated by: drinking and eating. Associated symptoms: no abdominal pain, no chills, no fever and no myalgias   Risk factors: suspect food intake (She believes it might have been [facility] food that made her sick)   Risk factors: no sick contacts    Patient reports that she was recently started on Mounjaro    Medical Decision Making Amount and/or Complexity of Data Reviewed Labs: ordered. Decision-making details documented in ED Course. Radiology: ordered and independent interpretation performed. Decision-making details documented in ED Course. ECG/medicine tests: ordered and independent interpretation performed. Decision-making details documented in ED Course.  Risk Prescription drug management.    DDx considered.  Workup for cough was negative for pneumonia, COVID, influenza, RSV.  No evidence of heart failure. Wheezing improved with albuterol  puffs. Possible viral bronchitis.  Diarrhea work up reassuring without evidence of colitis or severe gastroenteritis on CT scan.  No renal insufficiency.  No AKI.  Patient denied any recent antibiotic use and  has not had a bowel movement since being here for several hours.  Unlikely C. difficile. Possibly related to Mounjaro side effect  Patient noted to be slightly tachycardic and dry skin.  Oral hydration and IV fluids given.  Thyroid  panel reassuring.  Tolerated PO.   Final Clinical Impression(s) / ED Diagnoses Final diagnoses:  Bronchitis  Diarrhea, unspecified type   The patient appears reasonably screened and/or stabilized for discharge and I doubt any other medical condition or other [facility] requiring further screening, evaluation, or treatment in the ED at this time. I have discussed the findings, Dx and Tx plan with the patient/family who expressed understanding and agree(s) with the plan. Discharge instructions discussed at length. The patient/family was given strict return precautions who verbalized understanding of the instructions. No further questions at time of discharge.  Disposition: Discharge  Condition: Good  ED Discharge Orders     None         Follow Up: Joshua Santana CROME, NP [address] [place] [address] [phone]  Call  to schedule an appointment for close follow up     Past Medical History Past Medical History:  Diagnosis Date   Arthritis    Complication of anesthesia    Diabetes mellitus without complication (HCC)    Heart murmur    as a child   Hypertension    PONV (postoperative nausea and vomiting)    Seizures (HCC)    Shortness of breath dyspnea    sometimes  at night   Patient Active Problem List   Diagnosis Date Noted   Syncope 08/23/2022   Fall at home 08/22/2022   Aortic atherosclerosis 07/14/2019   Uncontrolled type 2  diabetes mellitus with hyperglycemia (HCC) 05/30/2019   Acute encephalopathy 05/29/2019   Humerus fracture 05/23/2019   Closed lumbar vertebral fracture (HCC) 05/23/2019   Leukocytosis 05/23/2019   Hyperglycemia 05/23/2019   Fall 05/23/2019   Obesity (BMI 30-39.9) 08/07/2018   Gait disturbance  12/24/2015   Health care maintenance 04/09/2015   H/O total hip arthroplasty 11/09/2014   Presence of unspecified artificial hip joint 11/09/2014   Osteomyelitis of right hip (HCC) 07/12/2014   Chest pain 06/22/2014   Anemia due to blood loss 06/20/2014   Status post revision of total hip replacement 06/08/2014   Anxiety state 03/05/2008   FATIGUE 03/05/2008   Uncontrolled type 2 diabetes mellitus with hyperglycemia, with long-term current use of insulin  (HCC) 01/02/2008   Hyperlipidemia 01/02/2008   HTN (hypertension) 01/02/2008   Home Medication(s) Prior to Admission medications  Medication Sig Start Date End Date Taking? Authorizing Provider  ACCU-CHEK AVIVA PLUS test strip 1 each by Other route daily. 05/02/19   [provider]  acetaminophen  (TYLENOL ) 325 MG tablet Take 650 mg by mouth every 6 (six) hours as needed for mild pain.    [provider]  insulin  aspart (NOVOLOG  FLEXPEN) 100 UNIT/ML FlexPen Inject 16 Units as directed 3 (three) times daily with meals. Per sliding scale and not to exceed 81 units daily. 04/05/19   [provider]  Insulin  Detemir (LEVEMIR  FLEXTOUCH) 100 UNIT/ML Pen Inject 40 Units into the skin at bedtime. Patient taking differently: Inject 60 Units into the skin at bedtime. 12/09/16   Freddi Hamilton, MD  Insulin  Pen Needle (FIFTY50 PEN NEEDLES) 31G X 8 MM MISC 1 each by Other route daily. 02/15/18   [provider]  lisinopril  (PRINIVIL ,ZESTRIL ) 2.5 MG tablet Take 2.5 mg by mouth every evening.  Patient not taking: Reported on 08/22/2022 06/24/17   [provider]                                                                                                                                    Allergies Metformin, Penicillins, Seldane [terfenadine], Sulfonamide derivatives, Vicks formula 44  cough relief [dextromethorphan hbr], Xanax [alprazolam], Iohexol , Latex, Other, and Percocet [oxycodone -acetaminophen ]  Review  of Systems Review of Systems  Constitutional:  Negative for chills and fever.  HENT:  Negative for sore throat.   Respiratory:  Positive for cough. Negative for shortness of breath.   Cardiovascular:  Negative for chest pain.  Gastrointestinal:  Positive for diarrhea. Negative for abdominal pain.  Musculoskeletal:  Negative for myalgias.   As noted in HPI  Physical Exam Vital Signs  I have reviewed the triage vital signs BP 135/89   Pulse (!) 102   Temp 97.8 F (36.6 C) (Oral)   Resp (!) 24   Ht 5' 2 (1.575 m)   Wt 90 kg   SpO2 100%   BMI 36.29 kg/m   Physical Exam Vitals reviewed.  Constitutional:  General: She is not in acute distress.    Appearance: She is well-developed. She is not diaphoretic.  HENT:     Head: Normocephalic and atraumatic.     Nose: Nose normal.  Eyes:     General: No scleral icterus.       Right eye: No discharge.        Left eye: No discharge.     Conjunctiva/sclera: Conjunctivae normal.     Pupils: Pupils are equal, round, and reactive to light.  Cardiovascular:     Rate and Rhythm: Normal rate and regular rhythm.     Heart sounds: No murmur heard.    No friction rub. No gallop.  Pulmonary:     Effort: Pulmonary effort is normal. No tachypnea or respiratory distress.     Breath sounds: No stridor. Examination of the right-middle field reveals wheezing. Examination of the left-middle field reveals wheezing. Examination of the right-lower field reveals wheezing. Wheezing present. No rales.  Abdominal:     General: There is no distension.     Palpations: Abdomen is soft.     Tenderness: There is no abdominal tenderness.  Musculoskeletal:        General: No tenderness.     Cervical back: Normal range of motion and neck supple.  Skin:    General: Skin is warm and dry.     Findings: No erythema or rash.  Neurological:     Mental Status: She is alert and oriented to person, place, and time.     ED Results and Treatments Labs (all  labs ordered are listed, but only abnormal results are displayed) Labs Reviewed  COMPREHENSIVE METABOLIC PANEL WITH GFR - Abnormal; Notable for the following components:      Result Value   Glucose, Bld 189 (*)    Total Protein 8.7 (*)    All other components within normal limits  CBC - Abnormal; Notable for the following components:   RBC 6.10 (*)    Hemoglobin 15.8 (*)    HCT 50.4 (*)    MCH 25.9 (*)    All other components within normal limits  RESP PANEL BY RT-PCR (RSV, FLU A&B, COVID)  RVPGX2  LIPASE, BLOOD  TSH  T4, FREE  PRO BRAIN NATRIURETIC PEPTIDE                                                                                                                         EKG  EKG Interpretation Date/Time:  Sunday June 25 2024 03:40:58 EST Ventricular Rate:  102 PR Interval:  160 QRS Duration:  96 QT Interval:  341 QTC Calculation: 445 R Axis:   65  Text Interpretation: Sinus tachycardia Low voltage, precordial leads Borderline T abnormalities, anterior leads Confirmed by Trine Likes [phone]) on 06/25/2024 5:09:59 AM       Radiology CT ABDOMEN PELVIS WO CONTRAST Result Date: 06/24/2024 CLINICAL DATA:  Abdomen pain EXAM: CT ABDOMEN AND PELVIS WITHOUT CONTRAST TECHNIQUE: Multidetector CT imaging of the  abdomen and pelvis was performed following the standard protocol without IV contrast. RADIATION DOSE REDUCTION: This exam was performed according to the departmental dose-optimization program which includes automated exposure control, adjustment of the mA and/or kV according to patient size and/or use of iterative reconstruction technique. COMPARISON:  CT 05/18/2019 FINDINGS: Lower chest: Lung bases demonstrate moderate hiatal hernia. No acute airspace disease Hepatobiliary: Cholecystectomy. No biliary dilatation. Stable probable hepatic cyst. Pancreas: Unremarkable. No pancreatic ductal dilatation or surrounding inflammatory changes. Spleen: Normal in size without focal  abnormality. Adrenals/Urinary Tract: Adrenal glands are unremarkable. Kidneys are normal, without renal calculi, focal lesion, or hydronephrosis. Bladder is unremarkable. Stomach/Bowel: Stomach nondistended. No dilated small bowel. No acute bowel wall thickening. Stump like appendix without inflammation. Vascular/Lymphatic: Aortic atherosclerosis. No enlarged abdominal or pelvic lymph nodes. Reproductive: Hysterectomy.  No adnexal mass Other: No ascites or free air Musculoskeletal: Right hip replacement. No acute osseous abnormality. Chronic inferior endplate deformity at L2 IMPRESSION: 1. No CT evidence for acute intra-abdominal or pelvic abnormality. 2. Moderate hiatal hernia. 3. Aortic atherosclerosis. Aortic Atherosclerosis (ICD10-I70.0). Electronically Signed   By: Luke Bun M.D.   On: 06/24/2024 21:32   DG Chest 2 View Result Date: 06/24/2024 CLINICAL DATA:  Cough. EXAM: CHEST - 2 VIEW COMPARISON:  Radiograph and CT 09/12/2022 FINDINGS: Patient is rotated. Lung volumes are low. The cardiomediastinal contours are stable. Retrocardiac hiatal hernia. Pulmonary vasculature is normal. No consolidation, pleural effusion, or pneumothorax. No acute osseous abnormalities are seen. IMPRESSION: 1. Low lung volumes without acute abnormality. 2. Retrocardiac hiatal hernia. Electronically Signed   By: Kimberlee Gasman M.D.   On: 06/24/2024 19:52    Medications Ordered in ED Medications  sodium chloride  0.9 % bolus 500 mL (0 mLs Intravenous Stopped 06/25/24 0727)  albuterol  (VENTOLIN  HFA) 108 (90 Base) MCG/ACT inhaler 2 puff (2 puffs Inhalation Given 06/25/24 0549)  AeroChamber Plus Flo-Vu Large MISC 1 each (1 each Other Given 06/25/24 0558)   Procedures Procedures  (including critical care time)   This chart was dictated using voice recognition software.  Despite best efforts to proofread,  errors can occur which can change the documentation meaning.   Trine Raynell Moder, MD 06/25/24 0730  "
